# Patient Record
Sex: Female | Born: 1955
Health system: Southern US, Community
[De-identification: ages and names within clinical notes are randomized; demographics above are authoritative.]

## PROBLEM LIST (undated history)

## (undated) ENCOUNTER — Ambulatory Visit (HOSPITAL_COMMUNITY): Admission: EM | Disposition: A | Discharge status: Other Acute Inpatient Hospital | Payer: HMO

## (undated) DIAGNOSIS — G4733 Obstructive sleep apnea (adult) (pediatric): Secondary | ICD-10-CM

## (undated) DIAGNOSIS — I10 Essential (primary) hypertension: Secondary | ICD-10-CM

## (undated) DIAGNOSIS — H409 Unspecified glaucoma: Secondary | ICD-10-CM

## (undated) DIAGNOSIS — G35 Multiple sclerosis: Secondary | ICD-10-CM

## (undated) DIAGNOSIS — R32 Unspecified urinary incontinence: Secondary | ICD-10-CM

## (undated) DIAGNOSIS — G43909 Migraine, unspecified, not intractable, without status migrainosus: Secondary | ICD-10-CM

## (undated) DIAGNOSIS — G2581 Restless legs syndrome: Secondary | ICD-10-CM

## (undated) DIAGNOSIS — I82409 Acute embolism and thrombosis of unspecified deep veins of unspecified lower extremity: Secondary | ICD-10-CM

## (undated) DIAGNOSIS — K8050 Calculus of bile duct without cholangitis or cholecystitis without obstruction: Secondary | ICD-10-CM

## (undated) DIAGNOSIS — G4730 Sleep apnea, unspecified: Secondary | ICD-10-CM

## (undated) DIAGNOSIS — J189 Pneumonia, unspecified organism: Secondary | ICD-10-CM

## (undated) DIAGNOSIS — R0602 Shortness of breath: Secondary | ICD-10-CM

## (undated) DIAGNOSIS — IMO0001 Reserved for inherently not codable concepts without codable children: Secondary | ICD-10-CM

## (undated) DIAGNOSIS — H469 Unspecified optic neuritis: Secondary | ICD-10-CM

## (undated) DIAGNOSIS — K219 Gastro-esophageal reflux disease without esophagitis: Secondary | ICD-10-CM

## (undated) DIAGNOSIS — E119 Type 2 diabetes mellitus without complications: Secondary | ICD-10-CM

## (undated) DIAGNOSIS — E039 Hypothyroidism, unspecified: Secondary | ICD-10-CM

## (undated) DIAGNOSIS — K5790 Diverticulosis of intestine, part unspecified, without perforation or abscess without bleeding: Secondary | ICD-10-CM

## (undated) DIAGNOSIS — E7800 Pure hypercholesterolemia, unspecified: Secondary | ICD-10-CM

## (undated) DIAGNOSIS — F988 Other specified behavioral and emotional disorders with onset usually occurring in childhood and adolescence: Secondary | ICD-10-CM

## (undated) DIAGNOSIS — R252 Cramp and spasm: Secondary | ICD-10-CM

## (undated) DIAGNOSIS — R011 Cardiac murmur, unspecified: Secondary | ICD-10-CM

## (undated) DIAGNOSIS — R600 Localized edema: Secondary | ICD-10-CM

## (undated) DIAGNOSIS — E079 Disorder of thyroid, unspecified: Secondary | ICD-10-CM

## (undated) DIAGNOSIS — F419 Anxiety disorder, unspecified: Secondary | ICD-10-CM

## (undated) HISTORY — DX: Sleep apnea, unspecified: G47.30

## (undated) HISTORY — DX: Unspecified glaucoma: H40.9

## (undated) HISTORY — DX: Shortness of breath: R06.02

## (undated) HISTORY — PX: MENISCUS REPAIR: SHX5179

## (undated) HISTORY — PX: TUBAL LIGATION: SHX77

## (undated) HISTORY — DX: Pure hypercholesterolemia, unspecified: E78.00

## (undated) HISTORY — DX: Localized edema: R60.0

## (undated) HISTORY — DX: Anxiety disorder, unspecified: F41.9

## (undated) HISTORY — DX: Hypothyroidism, unspecified: E03.9

## (undated) HISTORY — PX: BREAST LUMPECTOMY: SHX2

## (undated) HISTORY — PX: ABDOMINAL HYSTERECTOMY: SHX81

---
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
## 2006-03-20 ENCOUNTER — Encounter: Admission: RE | Admit: 2006-03-20 | Discharge: 2006-03-20 | Payer: Self-pay | Admitting: Family Medicine

## 2006-05-23 ENCOUNTER — Ambulatory Visit (HOSPITAL_COMMUNITY): Admission: RE | Admit: 2006-05-23 | Discharge: 2006-05-23 | Payer: Self-pay | Admitting: Obstetrics and Gynecology

## 2006-05-26 ENCOUNTER — Encounter: Admission: RE | Admit: 2006-05-26 | Discharge: 2006-05-26 | Payer: Self-pay | Admitting: Obstetrics and Gynecology

## 2006-10-27 ENCOUNTER — Encounter (INDEPENDENT_AMBULATORY_CARE_PROVIDER_SITE_OTHER): Payer: Self-pay | Admitting: Diagnostic Radiology

## 2006-10-27 ENCOUNTER — Encounter: Admission: RE | Admit: 2006-10-27 | Discharge: 2006-10-27 | Payer: Self-pay | Admitting: Family Medicine

## 2008-07-24 ENCOUNTER — Encounter: Admission: RE | Admit: 2008-07-24 | Discharge: 2008-07-24 | Payer: Self-pay | Admitting: Family Medicine

## 2008-08-18 ENCOUNTER — Emergency Department (HOSPITAL_COMMUNITY): Admission: EM | Admit: 2008-08-18 | Discharge: 2008-08-18 | Payer: Self-pay | Admitting: Emergency Medicine

## 2008-10-18 ENCOUNTER — Emergency Department (HOSPITAL_COMMUNITY): Admission: EM | Admit: 2008-10-18 | Discharge: 2008-10-18 | Payer: Self-pay | Admitting: Family Medicine

## 2009-02-23 ENCOUNTER — Encounter: Admission: RE | Admit: 2009-02-23 | Discharge: 2009-02-23 | Payer: Self-pay | Admitting: Family Medicine

## 2009-07-24 ENCOUNTER — Emergency Department (HOSPITAL_COMMUNITY): Admission: EM | Admit: 2009-07-24 | Discharge: 2009-07-25 | Payer: Self-pay | Admitting: Emergency Medicine

## 2010-02-14 ENCOUNTER — Encounter: Payer: Self-pay | Admitting: Neurology

## 2010-02-14 ENCOUNTER — Encounter: Payer: Self-pay | Admitting: Podiatry

## 2010-02-14 ENCOUNTER — Encounter: Payer: Self-pay | Admitting: Family Medicine

## 2010-04-11 LAB — URINALYSIS, ROUTINE W REFLEX MICROSCOPIC
Bilirubin Urine: NEGATIVE
Glucose, UA: NEGATIVE mg/dL
Hgb urine dipstick: NEGATIVE
Ketones, ur: NEGATIVE mg/dL
pH: 6 (ref 5.0–8.0)

## 2010-04-11 LAB — POCT I-STAT, CHEM 8
Chloride: 106 mEq/L (ref 96–112)
Creatinine, Ser: 0.9 mg/dL (ref 0.4–1.2)
Glucose, Bld: 83 mg/dL (ref 70–99)
HCT: 38 % (ref 36.0–46.0)
Hemoglobin: 12.9 g/dL (ref 12.0–15.0)
Potassium: 3.8 mEq/L (ref 3.5–5.1)
Sodium: 141 mEq/L (ref 135–145)

## 2010-04-11 LAB — DIFFERENTIAL
Basophils Absolute: 0 10*3/uL (ref 0.0–0.1)
Basophils Relative: 0 % (ref 0–1)
Neutro Abs: 5 10*3/uL (ref 1.7–7.7)
Neutrophils Relative %: 58 % (ref 43–77)

## 2010-04-11 LAB — CBC
HCT: 35.4 % — ABNORMAL LOW (ref 36.0–46.0)
Hemoglobin: 12.2 g/dL (ref 12.0–15.0)
MCH: 29.5 pg (ref 26.0–34.0)
MCV: 85.4 fL (ref 78.0–100.0)
RBC: 4.15 MIL/uL (ref 3.87–5.11)

## 2010-04-11 LAB — POCT CARDIAC MARKERS: Troponin i, poc: <0.05 ng/mL (ref 0.00–0.09)

## 2010-05-02 LAB — POCT I-STAT, CHEM 8
Creatinine, Ser: 0.8 mg/dL (ref 0.4–1.2)
HCT: 37 % (ref 36.0–46.0)
Hemoglobin: 12.6 g/dL (ref 12.0–15.0)
Potassium: 4 mEq/L (ref 3.5–5.1)
Sodium: 139 mEq/L (ref 135–145)
TCO2: 25 mmol/L (ref 0–100)

## 2010-05-02 LAB — URINALYSIS, ROUTINE W REFLEX MICROSCOPIC
Nitrite: NEGATIVE
Specific Gravity, Urine: 1.013 (ref 1.005–1.030)
pH: 5.5 (ref 5.0–8.0)

## 2010-05-02 LAB — DIFFERENTIAL
Basophils Relative: 0 % (ref 0–1)
Eosinophils Relative: 2 % (ref 0–5)
Monocytes Absolute: 0.6 10*3/uL (ref 0.1–1.0)
Monocytes Relative: 9 % (ref 3–12)
Neutro Abs: 4.1 10*3/uL (ref 1.7–7.7)

## 2010-05-02 LAB — CBC
HCT: 36.4 % (ref 36.0–46.0)
Hemoglobin: 12.5 g/dL (ref 12.0–15.0)
MCHC: 34.3 g/dL (ref 30.0–36.0)
RBC: 4.25 MIL/uL (ref 3.87–5.11)

## 2011-07-17 ENCOUNTER — Encounter (HOSPITAL_COMMUNITY): Payer: Self-pay | Admitting: *Deleted

## 2011-07-17 ENCOUNTER — Emergency Department (INDEPENDENT_AMBULATORY_CARE_PROVIDER_SITE_OTHER): Payer: Medicare Other

## 2011-07-17 ENCOUNTER — Emergency Department (HOSPITAL_COMMUNITY)
Admission: EM | Admit: 2011-07-17 | Discharge: 2011-07-17 | Disposition: A | Discharge status: Home / Self Care | Payer: Medicare Other | Source: Home / Self Care | Attending: Emergency Medicine | Admitting: Emergency Medicine

## 2011-07-17 DIAGNOSIS — M7541 Impingement syndrome of right shoulder: Secondary | ICD-10-CM

## 2011-07-17 HISTORY — DX: Gastro-esophageal reflux disease without esophagitis: K21.9

## 2011-07-17 HISTORY — DX: Restless legs syndrome: G25.81

## 2011-07-17 HISTORY — DX: Multiple sclerosis: G35

## 2011-07-17 HISTORY — DX: Disorder of thyroid, unspecified: E07.9

## 2011-07-17 MED ORDER — HYDROCODONE-ACETAMINOPHEN 5-325 MG PO TABS: 2.0000 | ORAL_TABLET | ORAL | Status: AC | PRN

## 2011-07-17 MED ORDER — MELOXICAM 15 MG PO TABS: 15.0000 mg | ORAL_TABLET | Freq: Every day | ORAL | Status: AC

## 2011-07-17 MED ORDER — PREDNISONE 20 MG PO TABS: ORAL_TABLET | ORAL | Status: AC

## 2011-07-17 NOTE — ED Notes (Signed)
Denies any injury.  C/O gradual onset right shoulder pain x few weeks with progressive worsening.  Has tried heat, ice, TENS unit without any sustained relief.  Some ROM is painful, but "it hurts even if I'm perfectly still; sometimes hurts even just to breathe".  Pain intensified today "to where I couldn't even pay attention in church"; RUE numbness started today and she "had difficulty turning the steering wheel the pain was so bad".

## 2011-07-17 NOTE — ED Provider Notes (Signed)
History     CSN: [id number]  Arrival date & time 07/17/11  1348   First MD Initiated Contact with Patient 07/17/11 1358      Chief Complaint  Patient presents with  . Shoulder Pain    (Consider location/radiation/quality/duration/timing/severity/associated sxs/prior treatment) HPI Comments: Patient reports gradual onset of achy, "deep" right shoulder pain starting over the past several weeks. States this started after she spent some time cleaning out a relative's house, she was doing a lot of repetitive arm movements. States it has gotten acutely worse over the past week. States the pain is keeping her from going to sleep, is not waking her up at night. States the pain is worse when having her arms lifted up while driving,or forward flexed. Reports some decreased sensation down her right arm starting today. Reports weakness in her grip while she was trying to put together a playground playset, which involved a lot of of forward flexion and wrist rotation, and while driving when her arm was lifted up. This has since resolved. No arm weakness. No other neurologic deficits noted. She has tried heat, ice, TENS unit without relief. She does have a history of multiple sclerosis, but states that she usually gets the pains in her legs when she starts to have a flare. She does wear narrow strep bras, and states of her bras are the same. No history of recent or remote injury to her neck, shoulder. No fever, nausea, vomiting, redness, bruising, rash.  ROS as noted in HPI. All other ROS negative.    Patient is a 56 y.o. female presenting with shoulder pain. The history is provided by the patient. No language interpreter was used.  Shoulder Pain This is a new problem. The current episode started more than 1 week ago. The problem occurs constantly. The problem has been gradually worsening. Pertinent negatives include no chest pain and no headaches. Exacerbated by: use. Nothing relieves the symptoms. She has  tried a warm compress and a cold compress for the symptoms. The treatment provided no relief.    Past Medical History  Diagnosis Date  . Multiple sclerosis   . Restless leg syndrome   . Thyroid disease     Hypothyroidism  . GERD (gastroesophageal reflux disease)     Past Surgical History  Procedure Date  . Cesarean section   . Abdominal hysterectomy     History reviewed. No pertinent family history.  History  Substance Use Topics  . Smoking status: Former Games developer  . Smokeless tobacco: Not on file  . Alcohol Use: No    OB History    Grav Para Term Preterm Abortions TAB SAB Ect Mult Living                  Review of Systems  Constitutional: Negative for fever.  Cardiovascular: Negative for chest pain.  Musculoskeletal: Positive for arthralgias. Negative for back pain.  Skin: Negative.   Neurological: Positive for weakness and numbness. Negative for headaches.    Allergies  Review of patient's allergies indicates no known allergies.  Home Medications   Current Outpatient Rx  Name Route Sig Dispense Refill  . AMANTADINE HCL 100 MG PO CAPS Oral Take 100 mg by mouth 2 (two) times daily.    Marland Kitchen FLUOXETINE HCL PO Oral Take 30 mg by mouth daily.    Marland Kitchen GABAPENTIN 300 MG PO CAPS Oral Take 300 mg by mouth at bedtime.    . INTERFERON BETA-1A 30 MCG/0.5ML IM KIT Intramuscular Inject 30 mcg  into the muscle every 7 (seven) days.    Marland Kitchen LEVOTHYROXINE SODIUM 150 MCG PO TABS Oral Take 150 mcg by mouth daily.    Marland Kitchen OMEPRAZOLE 20 MG PO CPDR Oral Take 20 mg by mouth daily.    . OXYBUTYNIN CHLORIDE 5 MG PO TABS Oral Take 5 mg by mouth daily.    Marland Kitchen ROPINIROLE HCL 3 MG PO TABS Oral Take 3 mg by mouth at bedtime.    Marland Kitchen HYDROCODONE-ACETAMINOPHEN 5-325 MG PO TABS Oral Take 2 tablets by mouth every 4 (four) hours as needed for pain. 20 tablet 0  . MELOXICAM 15 MG PO TABS Oral Take 1 tablet (15 mg total) by mouth daily. 14 tablet 0  . PREDNISONE 20 MG PO TABS  Take 3 tabs po on first day, 2 tabs  second day, 2 tabs third day, 1 tab fourth day, 1 tab 5th day. Take with food. 9 tablet 0    BP 114/65  Pulse 70  Temp 98 F (36.7 C) (Oral)  Resp 16  SpO2 97%  Physical Exam  Nursing note and vitals reviewed. Constitutional: She is oriented to person, place, and time. She appears well-developed and well-nourished. No distress.  HENT:  Head: Normocephalic and atraumatic.  Eyes: Conjunctivae and EOM are normal.  Neck: Normal range of motion.  Cardiovascular: Normal rate, regular rhythm, normal heart sounds and intact distal pulses.   Pulmonary/Chest: Effort normal.  Abdominal: She exhibits no distension.  Musculoskeletal: Normal range of motion.       Right shoulder: She exhibits tenderness. She exhibits no swelling, no effusion and no crepitus.       Deep grooves where bras straps are in shoulders bilaterally, right more than left. Tenderness in this area, and at [facility] joint .R shoulder with ROM normal  Drop test norma, Tenderness entire joint, clavicle NT , scapula NT , proximal humerus NT, shoulder joint NT, Motor strength normal , Sensation intact LT, sharp/soft/temperature intact over entire arm, over deltoid region, distal NVI with hand on affected side having intact sensation and strength in the distribution of the median, radial, and ulnar nerve   negative tenderness in bicipital groove, positive empty can test, negative liftoff test, no instability with abduction/external rotation.   Neurological: She is alert and oriented to person, place, and time.  Reflex Scores:      Brachioradialis reflexes are 2+ on the right side and 2+ on the left side. Skin: Skin is warm and dry.  Psychiatric: She has a normal mood and affect. Her behavior is normal. Judgment and thought content normal.    ED Course  Procedures (including critical care time)  Labs Reviewed - No data to display Dg Shoulder Right  07/17/2011  *RADIOLOGY REPORT*  Clinical Data: The anterior shoulder pain.  RIGHT  SHOULDER - 2+ VIEW  Comparison: None  Findings: The glenohumeral joint is maintained.  There are mild AC joint degenerative changes.  No acute fracture.  The right lung apex is clear.  IMPRESSION: No acute bony findings.  Mild AC joint degenerative changes.  Original Report Authenticated By: P. Loralie Champagne, M.D.     1. Impingement syndrome of right shoulder      Dg Shoulder Right  07/17/2011  *RADIOLOGY REPORT*  Clinical Data: The anterior shoulder pain.  RIGHT SHOULDER - 2+ VIEW  Comparison: None  Findings: The glenohumeral joint is maintained.  There are mild AC joint degenerative changes.  No acute fracture.  The right lung apex is clear.  IMPRESSION: No  acute bony findings.  Mild AC joint degenerative changes.  Original Report Authenticated By: P. Loralie Champagne, M.D.     MDM  H&P consistent with shoulder impingement. Pain is aggravated with empty can test, and when her arm is moved through forward flexion/internal/external rotation. Grip is equal bilaterally, and right grip is normal when moving arm through complete range of motion. Suspect that she has some impingement which would also contribute to some of her neurologic symptoms. Will check x-ray. Also, discussed with her that the bra straps are most likely contributing to her shoulder pain as well. If her  x-rays are normal, will refer her to Dr. Magnus Ivan, orthopedist on call.   Imaging reviewed by myself. Report per radiologist.   Luiz Blare, MD 07/17/11 [phone]

## 2011-07-17 NOTE — Discharge Instructions (Signed)
Take the medication as written. Take 1 gram of tylenol up to 4 times a day as needed for pain.This with the meloxicam is an effective combination for pain. Take the hydrocodone/norco only for severe pain. Do not take the tylenol and hydrocodone/norco as they both have tylenol in them and too much can hurt your liver. Do not exceed 4 grams of tylenol a day from all sources. Return if you get worse, have a  fever >100.4, or for any concerns.   Go to www.goodrx.com to look up your medications. This will give you a list of where you can find your prescriptions at the most affordable prices.   

## 2011-11-20 ENCOUNTER — Encounter (HOSPITAL_COMMUNITY): Payer: Self-pay

## 2011-11-20 ENCOUNTER — Emergency Department (INDEPENDENT_AMBULATORY_CARE_PROVIDER_SITE_OTHER): Payer: Medicare Other

## 2011-11-20 ENCOUNTER — Emergency Department (INDEPENDENT_AMBULATORY_CARE_PROVIDER_SITE_OTHER)
Admission: EM | Admit: 2011-11-20 | Discharge: 2011-11-20 | Disposition: A | Discharge status: Home / Self Care | Payer: Medicare Other | Source: Home / Self Care | Attending: Emergency Medicine | Admitting: Emergency Medicine

## 2011-11-20 DIAGNOSIS — Z79899 Other long term (current) drug therapy: Secondary | ICD-10-CM

## 2011-11-20 DIAGNOSIS — W19XXXA Unspecified fall, initial encounter: Secondary | ICD-10-CM

## 2011-11-20 DIAGNOSIS — S61409A Unspecified open wound of unspecified hand, initial encounter: Secondary | ICD-10-CM

## 2011-11-20 DIAGNOSIS — S61419A Laceration without foreign body of unspecified hand, initial encounter: Secondary | ICD-10-CM

## 2011-11-20 HISTORY — DX: Type 2 diabetes mellitus without complications: E11.9

## 2011-11-20 NOTE — ED Notes (Signed)
Patient states fell and hurt left hand today, small laceration, happened approx 11:15

## 2011-11-20 NOTE — ED Provider Notes (Signed)
History     CSN: [id number]  Arrival date & time 11/20/11  1336   First MD Initiated Contact with Patient 11/20/11 1403      Chief Complaint  Patient presents with  . Hand Injury    (Consider location/radiation/quality/duration/timing/severity/associated sxs/prior treatment) HPI Comments: Patient presents for yourself to urgent care describing that she fell today backwards when she was at a childcare at church when she fell backwards hurting and cutting the palmar surface of her left hand and the base of her left the fifth digit. This will occur earlier this morning around 11 AM. She is unclear about what happened but denies any loss of consciousness and she remembers the event but is not sure if she tripped slipped or what happened. She does admit that she was somewhat drowsy or not in her usual state. She also recalls that people around her helped her incorporate and provide her with some candy as someone suspected that she might have had a low sugar. No emergent medical attention or vital signs were obtained at that point. She presents almost 2 hours later to urgent care.    Full of unclear mechanism. As patient denies any loss of consciousness. She reports that other individuals reported that she looks somewhat drowsy when she fell she was able to [facility] her left hand to prevent a fall and did not hit her head as she fell backwards. Daughter is oh also reported phoned she felt somewhat she was in an environment in which the temperature was very high and she might have not had anything to eat for several hours they suspected that she could have had develop a hypoglycemic event patient was given candy after the event. Patient denies pre-event having felt any of the following symptoms. Feeling presyncopal, headaches, nauseous, chest pains, shortness of breath, or palpitations.     Patient is a 56 y.o. female presenting with hand injury. The history is provided by the patient.  Hand  Injury  The incident occurred 1 to 2 hours ago. Incident location: At church (child-care) The injury mechanism was a fall. Pain location: Left hand. The quality of the pain is described as aching. The pain is at a severity of 4/10. The pain is mild. The pain has been constant since the incident. Pertinent negatives include no fever and no malaise/fatigue. She has tried nothing for the symptoms. The treatment provided no relief.    Past Medical History  Diagnosis Date  . Multiple sclerosis   . Restless leg syndrome   . Thyroid disease     Hypothyroidism  . GERD (gastroesophageal reflux disease)   . Diabetes mellitus without complication     Past Surgical History  Procedure Date  . Cesarean section   . Abdominal hysterectomy     No family history on file.  History  Substance Use Topics  . Smoking status: Former Games developer  . Smokeless tobacco: Not on file  . Alcohol Use: No    OB History    Grav Para Term Preterm Abortions TAB SAB Ect Mult Living                  Review of Systems  Constitutional: Positive for activity change and appetite change. Negative for fever, chills, malaise/fatigue and diaphoresis.  Respiratory: Negative for cough, chest tightness and shortness of breath.   Gastrointestinal: Negative for nausea and abdominal pain.  Skin: Negative for color change.  Neurological: Negative for weakness, numbness and headaches.    Allergies  Review  of patient's allergies indicates no known allergies.  Home Medications   Current Outpatient Rx  Name Route Sig Dispense Refill  . AMANTADINE HCL 100 MG PO CAPS Oral Take 100 mg by mouth 2 (two) times daily.    Marland Kitchen FLUOXETINE HCL PO Oral Take 30 mg by mouth daily.    Marland Kitchen GABAPENTIN 300 MG PO CAPS Oral Take 300 mg by mouth at bedtime.    . INTERFERON BETA-1A 30 MCG/0.5ML IM KIT Intramuscular Inject 30 mcg into the muscle every 7 (seven) days.    Marland Kitchen LEVOTHYROXINE SODIUM 150 MCG PO TABS Oral Take 150 mcg by mouth daily.    Marland Kitchen  OMEPRAZOLE 20 MG PO CPDR Oral Take 20 mg by mouth daily.    . OXYBUTYNIN CHLORIDE 5 MG PO TABS Oral Take 5 mg by mouth daily.    Marland Kitchen ROPINIROLE HCL 3 MG PO TABS Oral Take 3 mg by mouth at bedtime.    . MELOXICAM 15 MG PO TABS Oral Take 1 tablet (15 mg total) by mouth daily. 14 tablet 0    BP 124/73  Pulse 76  Temp 97.9 F (36.6 C) (Oral)  Resp 18  SpO2 97%  Physical Exam  Nursing note and vitals reviewed. Constitutional: Vital signs are normal. She appears well-developed and well-nourished.  Non-toxic appearance. She does not have a sickly appearance. She does not appear ill. No distress.  HENT:  Mouth/Throat: No oropharyngeal exudate.  Eyes: Conjunctivae normal are normal.  Neck: Neck supple. No tracheal deviation present.  Cardiovascular: Normal rate and regular rhythm.  Exam reveals no gallop and no friction rub.   No murmur heard. Pulmonary/Chest: Effort normal and breath sounds normal.  Abdominal: She exhibits no distension. There is no tenderness.  Genitourinary: No vaginal discharge found.  Musculoskeletal: She exhibits no tenderness.       Left hand: She exhibits laceration.       Hands: Neurological: She is alert.  Skin: Rash noted. No erythema.    ED Course  Procedures (including critical care time)  Labs Reviewed  GLUCOSE, CAPILLARY - Abnormal; Notable for the following:    Glucose-Capillary 108 (*)     All other components within normal limits   Dg Hand Complete Left  11/20/2011  *RADIOLOGY REPORT*  Clinical Data: Trauma, pain to the second digit.  LEFT HAND - COMPLETE 3+ VIEW  Comparison: None.  Findings: No evidence of fracture or dislocation of the carpal bones, metacarpals, or phalanges.  Particular attention is directed to the second digit.  No radiodense foreign body.  IMPRESSION: No evidence of hand fracture.   Original Report Authenticated By: Genevive Bi, M.D.      1. Fall   2. Laceration of hand       MDM  Full, after suspected hypoglycemic  event. Without loss of consciousness. Sustaining a small irregular left palmar hand laceration that was repaired with Steri-Strips after cleaning seeing. Patient had a full neurological and cardiovascular exam including a rhythm strip of 60 seconds and reveal no abnormalities.     Jimmie Molly, MD 11/20/11 [phone]

## 2013-05-12 ENCOUNTER — Encounter (HOSPITAL_COMMUNITY): Payer: Self-pay | Admitting: Emergency Medicine

## 2013-05-12 ENCOUNTER — Emergency Department (HOSPITAL_COMMUNITY): Payer: Medicare Other

## 2013-05-12 ENCOUNTER — Emergency Department (HOSPITAL_COMMUNITY)
Admission: EM | Admit: 2013-05-12 | Discharge: 2013-05-12 | Disposition: A | Discharge status: Home / Self Care | Payer: Medicare Other | Attending: Emergency Medicine | Admitting: Emergency Medicine

## 2013-05-12 DIAGNOSIS — R0602 Shortness of breath: Secondary | ICD-10-CM | POA: Insufficient documentation

## 2013-05-12 DIAGNOSIS — R0789 Other chest pain: Secondary | ICD-10-CM | POA: Insufficient documentation

## 2013-05-12 DIAGNOSIS — Z79899 Other long term (current) drug therapy: Secondary | ICD-10-CM | POA: Insufficient documentation

## 2013-05-12 DIAGNOSIS — M549 Dorsalgia, unspecified: Secondary | ICD-10-CM | POA: Insufficient documentation

## 2013-05-12 DIAGNOSIS — Z87891 Personal history of nicotine dependence: Secondary | ICD-10-CM | POA: Insufficient documentation

## 2013-05-12 DIAGNOSIS — R079 Chest pain, unspecified: Secondary | ICD-10-CM

## 2013-05-12 DIAGNOSIS — E119 Type 2 diabetes mellitus without complications: Secondary | ICD-10-CM | POA: Insufficient documentation

## 2013-05-12 DIAGNOSIS — K219 Gastro-esophageal reflux disease without esophagitis: Secondary | ICD-10-CM | POA: Insufficient documentation

## 2013-05-12 DIAGNOSIS — E039 Hypothyroidism, unspecified: Secondary | ICD-10-CM | POA: Insufficient documentation

## 2013-05-12 DIAGNOSIS — G2581 Restless legs syndrome: Secondary | ICD-10-CM | POA: Insufficient documentation

## 2013-05-12 LAB — CBC WITH DIFFERENTIAL/PLATELET
BASOS PCT: 0 % (ref 0–1)
Basophils Absolute: 0 10*3/uL (ref 0.0–0.1)
EOS PCT: 2 % (ref 0–5)
Eosinophils Absolute: 0.2 10*3/uL (ref 0.0–0.7)
HEMATOCRIT: 37.7 % (ref 36.0–46.0)
HEMOGLOBIN: 12.3 g/dL (ref 12.0–15.0)
Lymphocytes Relative: 33 % (ref 12–46)
Lymphs Abs: 2.2 10*3/uL (ref 0.7–4.0)
MCH: 28.1 pg (ref 26.0–34.0)
MCHC: 32.6 g/dL (ref 30.0–36.0)
MCV: 86.1 fL (ref 78.0–100.0)
MONO ABS: 0.6 10*3/uL (ref 0.1–1.0)
MONOS PCT: 9 % (ref 3–12)
NEUTROS ABS: 3.7 10*3/uL (ref 1.7–7.7)
Neutrophils Relative %: 56 % (ref 43–77)
Platelets: 251 10*3/uL (ref 150–400)
RBC: 4.38 MIL/uL (ref 3.87–5.11)
RDW: 14.3 % (ref 11.5–15.5)
WBC: 6.6 10*3/uL (ref 4.0–10.5)

## 2013-05-12 LAB — I-STAT TROPONIN, ED: TROPONIN I, POC: 0 ng/mL (ref 0.00–0.08)

## 2013-05-12 LAB — COMPREHENSIVE METABOLIC PANEL
ALBUMIN: 3.8 g/dL (ref 3.5–5.2)
ALT: 20 U/L (ref 0–35)
AST: 18 U/L (ref 0–37)
Alkaline Phosphatase: 95 U/L (ref 39–117)
BILIRUBIN TOTAL: 0.4 mg/dL (ref 0.3–1.2)
BUN: 13 mg/dL (ref 6–23)
CALCIUM: 9.2 mg/dL (ref 8.4–10.5)
CHLORIDE: 106 meq/L (ref 96–112)
CO2: 25 meq/L (ref 19–32)
CREATININE: 0.77 mg/dL (ref 0.50–1.10)
GFR calc Af Amer: >90 mL/min (ref >90)
Glucose, Bld: 123 mg/dL — ABNORMAL HIGH (ref 70–99)
Potassium: 4.1 mEq/L (ref 3.7–5.3)
Sodium: 143 mEq/L (ref 137–147)
Total Protein: 7.1 g/dL (ref 6.0–8.3)

## 2013-05-12 LAB — LIPASE, BLOOD: Lipase: 22 U/L (ref 11–59)

## 2013-05-12 LAB — D-DIMER, QUANTITATIVE (NOT AT ARMC): D DIMER QUANT: 0.45 ug{FEU}/mL (ref 0.00–0.48)

## 2013-05-12 NOTE — ED Provider Notes (Signed)
Medical screening examination/treatment/procedure(s) were performed by non-physician practitioner and as supervising physician I was immediately available for consultation/collaboration.   EKG Interpretation None       Aceyn Kathol R. Ananda Caya, MD 05/12/13 2042 

## 2013-05-12 NOTE — ED Provider Notes (Signed)
CSN: [id number]     Arrival date & time 05/12/13  1538 History   First MD Initiated Contact with Patient 05/12/13 1554     Chief Complaint  Patient presents with  . Chest Pain     (Consider location/radiation/quality/duration/timing/severity/associated sxs/prior Treatment) HPI Comments: Patient presents with history of diabetes, hyperlipidemia, remote smoking history -- complaint of chest heaviness in the middle of her chest and right side of her chest with radiation to her mid back. Patient had an episode of heaviness 5 days ago while working in her yard that resolved after sitting, resting, and cooling off. She did not have any recurrence until this morning when she developed a pressure approximately 7 AM today. It has been constant since that time. Patient states that she feels short of breath especially with exertion. She denies nausea, palpitations, diaphoresis. Patient denies risk factors for pulmonary embolism including: unilateral leg swelling, history of DVT/PE/other blood clots, use of estrogens, recent immobilizations, recent surgery, recent travel (>4hr segment), malignancy, hemoptysis. No family history of CAD/MI. No cough or fever. The onset of this condition was acute. The course is constant. Aggravating factors: none. Alleviating factors: none.      The history is provided by the patient.    Past Medical History  Diagnosis Date  . Multiple sclerosis   . Restless leg syndrome   . Thyroid disease     Hypothyroidism  . GERD (gastroesophageal reflux disease)   . Diabetes mellitus without complication    Past Surgical History  Procedure Laterality Date  . Cesarean section    . Abdominal hysterectomy     History reviewed. No pertinent family history. History  Substance Use Topics  . Smoking status: Former Research scientist (life sciences)  . Smokeless tobacco: Not on file  . Alcohol Use: No   OB History   Grav Para Term Preterm Abortions TAB SAB Ect Mult Living                 Review of  Systems  Constitutional: Negative for fever and diaphoresis.  Eyes: Negative for redness.  Respiratory: Positive for shortness of breath. Negative for cough.   Cardiovascular: Positive for chest pain. Negative for palpitations and leg swelling.  Gastrointestinal: Negative for nausea, vomiting and abdominal pain.  Genitourinary: Negative for dysuria.  Musculoskeletal: Positive for back pain. Negative for neck pain.  Skin: Negative for rash.  Neurological: Negative for syncope and light-headedness.    Allergies  Review of patient's allergies indicates no known allergies.  Home Medications   Prior to Admission medications   Medication Sig Start Date End Date Taking? Authorizing Provider  amantadine (SYMMETREL) 100 MG capsule Take 100 mg by mouth 2 (two) times daily.    Historical Provider, MD  FLUOXETINE HCL PO Take 30 mg by mouth daily.    Historical Provider, MD  gabapentin (NEURONTIN) 300 MG capsule Take 300 mg by mouth at bedtime.    Historical Provider, MD  interferon beta-1a (AVONEX) 30 MCG/0.5ML injection Inject 30 mcg into the muscle every 7 (seven) days.    Historical Provider, MD  levothyroxine (SYNTHROID, LEVOTHROID) 150 MCG tablet Take 150 mcg by mouth daily.    Historical Provider, MD  omeprazole (PRILOSEC) 20 MG capsule Take 20 mg by mouth daily.    Historical Provider, MD  oxybutynin (DITROPAN) 5 MG tablet Take 5 mg by mouth daily.    Historical Provider, MD  rOPINIRole (REQUIP) 3 MG tablet Take 3 mg by mouth at bedtime.    Historical Provider, MD  BP 162/67  Pulse 79  Temp(Src) 98.3 F (36.8 C) (Oral)  Resp 14  Ht 5\' 5"  (1.651 m)  Wt 290 lb (131.543 kg)  BMI 48.26 kg/m2  SpO2 97%   Physical Exam  Nursing note and vitals reviewed. Constitutional: She appears well-developed and well-nourished.  HENT:  Head: Normocephalic and atraumatic.  Mouth/Throat: Mucous membranes are normal. Mucous membranes are not dry.  Eyes: Conjunctivae are normal.  Neck: Trachea  normal and normal range of motion. Neck supple. Normal carotid pulses and no JVD present. No muscular tenderness present. Carotid bruit is not present. No tracheal deviation present.  Cardiovascular: Normal rate, regular rhythm, S1 normal, S2 normal, normal heart sounds and intact distal pulses.  Exam reveals no decreased pulses.   No murmur heard. Pulmonary/Chest: Effort normal. No respiratory distress. She has no wheezes. She exhibits no tenderness.  Abdominal: Soft. Normal aorta and bowel sounds are normal. There is no tenderness. There is no rebound and no guarding.  Musculoskeletal: Normal range of motion. She exhibits no edema and no tenderness.  No lower extremity edema.  Neurological: She is alert.  Skin: Skin is warm and dry. She is not diaphoretic. No cyanosis. No pallor.  Psychiatric: She has a normal mood and affect.    ED Course  Procedures (including critical care time) Labs Review Labs Reviewed  COMPREHENSIVE METABOLIC PANEL - Abnormal; Notable for the following:    Glucose, Bld 123 (*)    All other components within normal limits  CBC WITH DIFFERENTIAL  D-DIMER, QUANTITATIVE  LIPASE, BLOOD  I-STAT TROPOININ, ED    Imaging Review Dg Chest 2 View  05/12/2013   CLINICAL DATA:  Substernal chest pain with tightness and pressure. Cough and congestion.  EXAM: CHEST  2 VIEW  COMPARISON:  None.  FINDINGS: Cardiothoracic index 57 percent on PA view. The lungs appear clear. No pleural effusion. Mild to moderate thoracic spondylosis. No airway thickening identified.  IMPRESSION: 1. Mildly enlarged cardiopericardial silhouette. 2. Thoracic spondylosis.   Electronically Signed   By: Sherryl Barters M.D.   On: 05/12/2013 17:04     EKG Interpretation None      4:34 PM Patient seen and examined. Work-up initiated. Medications ordered.   Vital signs reviewed and are as follows: Filed Vitals:   05/12/13 1610  BP: 162/67  Pulse: 79  Temp:   Resp: 14    Date: 05/12/2013   Rate: 74  Rhythm: normal sinus rhythm  QRS Axis: normal  Intervals: normal  ST/T Wave abnormalities: normal  Conduction Disutrbances:none  Narrative Interpretation:   Old EKG Reviewed: none available  7:15 PM Patient d/w Dr. Alvino Chapel. Feel okay for d/c to home. HEART=2. Patient has PCP f/u. She is feeling better, pain resolved.   Repeat EKG shows no sign of evolving MI.    Date: 05/12/2013  Rate: 74  Rhythm: normal sinus rhythm  QRS Axis: normal  Intervals: normal  ST/T Wave abnormalities: normal  Conduction Disutrbances:none  Narrative Interpretation:   Old EKG Reviewed: unchanged  Pt informed of mild cardiomegaly.   Patient was counseled to return with severe chest pain, especially if the pain is crushing or pressure-like and spreads to the arms, back, neck, or jaw, or if they have sweating, nausea, or shortness of breath with the pain. They were encouraged to call 911 with these symptoms.   They were also told to return if their chest pain gets worse and does not go away with rest, they have an attack of chest pain  lasting longer than usual despite rest and treatment with the medications their caregiver has prescribed, if they wake from sleep with chest pain or shortness of breath, if they feel dizzy or faint, if they have chest pain not typical of their usual pain, or if they have any other emergent concerns regarding their health.  The patient verbalized understanding and agreed.    MDM   Final diagnoses:  Chest pain   Patient with chest tightness. Feel patient is low risk for ACS given history (poor story for ACS/MI, atypical and right sided CP), negative troponin(s), normal/unchanged EKG. HEART=2. Other causes of CP considered. D-dimer normal, do not suspect PE. Dissection low suspicion with this history, equal radial pulses, neg d-dimer. No PNA on CXR. Borderline cardiomegaly. No EKG findings, JVD to suggest tamponade or pericardial effusion/pericarditis. Gallbladder  etiology is possible, no transaminitis, pain controlled -- do not feel [place] necessary tonight. Patient has reliable PCP followup. Return instructions discussed with patient and daughter. Feel that she is reliable to return if worsening and followup with PCP.  No dangerous or life-threatening conditions suspected or identified by history, physical exam, and by work-up. No indications for hospitalization identified.        Carlisle Cater, PA-C 05/12/13 1922

## 2013-05-12 NOTE — ED Notes (Signed)
Pt states that she is having chest heaviness. Pt states that her pain radiates between her shoulder blades and she is also having dizziness. Pt states that she has a hx of DM and she was diagnosed 16 years ago with MS but her currently PCP does not believes that she has MS due to her MRI not being as bad as 16years worth of MS "should be." pt also complaining of right eye pain.

## 2013-05-12 NOTE — Discharge Instructions (Signed)
Please read and follow all provided instructions.  Your diagnoses today include:  1. Chest pain     Tests performed today include:  An EKG of your heart  Chest x-ray - mildly large heart on chest x-ray  Cardiac enzymes - a blood test for heart muscle damage, no sign of heart attack  Blood counts and electrolytes  D-dimer - test for blood clot that does not suggest blood clot  Vital signs. See below for your results today.   Medications prescribed:   None  Take any prescribed medications only as directed.  Follow-up instructions: Please follow-up with your primary care provider as soon as you can for further evaluation of your symptoms. If you do not have a primary care doctor -- see below for referral information.   Return instructions:  SEEK IMMEDIATE MEDICAL ATTENTION IF:  You have severe chest pain, especially if the pain is crushing or pressure-like and spreads to the arms, back, neck, or jaw, or if you have sweating, nausea (feeling sick to your stomach), or shortness of breath. THIS IS AN EMERGENCY. Don't wait to see if the pain will go away. Get medical help at once. Call 911 or [phone] (operator). DO NOT drive yourself to the hospital.   Your chest pain gets worse and does not go away with rest.   You have an attack of chest pain lasting longer than usual, despite rest and treatment with the medications your caregiver has prescribed.   You wake from sleep with chest pain or shortness of breath.  You feel dizzy or faint.  You have chest pain not typical of your usual pain for which you originally saw your caregiver.   You have any other emergent concerns regarding your health.  Additional Information: Chest pain comes from many different causes. Your caregiver has diagnosed you as having chest pain that is not specific for one problem, but does not require admission.  You are at low risk for an acute heart condition or other serious illness.   Your vital signs today  were: BP 132/69   Pulse 73   Temp(Src) 98.3 F (36.8 C) (Oral)   Resp 12   Ht 5\' 5"  (1.651 m)   Wt 290 lb (131.543 kg)   BMI 48.26 kg/m2   SpO2 98% If your blood pressure (BP) was elevated above 135/85 this visit, please have this repeated by your doctor within one month. --------------

## 2014-02-05 ENCOUNTER — Other Ambulatory Visit: Payer: Self-pay | Admitting: Physician Assistant

## 2014-02-05 DIAGNOSIS — R1013 Epigastric pain: Secondary | ICD-10-CM

## 2014-02-07 ENCOUNTER — Ambulatory Visit
Admission: RE | Admit: 2014-02-07 | Discharge: 2014-02-07 | Disposition: A | Discharge status: Home / Self Care | Payer: PPO | Source: Ambulatory Visit | Attending: Physician Assistant | Admitting: Physician Assistant

## 2014-02-07 DIAGNOSIS — R1013 Epigastric pain: Secondary | ICD-10-CM

## 2014-02-17 ENCOUNTER — Telehealth: Payer: Self-pay

## 2014-02-17 NOTE — Telephone Encounter (Signed)
Spoke with Benjamine Mola and advised that until she is seen once in this office, RAS is not able to refill meds.  Advised that until she is seen once at [facility], she is still a pt of [facility], and may request refill from that office.  She verbalized understanding of same/fim

## 2014-02-17 NOTE — Telephone Encounter (Signed)
Patient calling states she has dropped off her release at the front desk and she needs a written RX for Adderall 20 mg   Once daily.

## 2014-02-24 ENCOUNTER — Ambulatory Visit (INDEPENDENT_AMBULATORY_CARE_PROVIDER_SITE_OTHER): Payer: PPO | Admitting: Neurology

## 2014-02-24 ENCOUNTER — Encounter: Payer: Self-pay | Admitting: Neurology

## 2014-02-24 VITALS — BP 126/80 | HR 68 | Resp 14 | Ht 66.0 in | Wt 271.8 lb

## 2014-02-24 DIAGNOSIS — F09 Unspecified mental disorder due to known physiological condition: Secondary | ICD-10-CM

## 2014-02-24 DIAGNOSIS — F0789 Other personality and behavioral disorders due to known physiological condition: Secondary | ICD-10-CM | POA: Diagnosis not present

## 2014-02-24 DIAGNOSIS — G35 Multiple sclerosis: Secondary | ICD-10-CM

## 2014-02-24 DIAGNOSIS — E039 Hypothyroidism, unspecified: Secondary | ICD-10-CM | POA: Diagnosis not present

## 2014-02-24 DIAGNOSIS — R5382 Chronic fatigue, unspecified: Secondary | ICD-10-CM

## 2014-02-24 DIAGNOSIS — K219 Gastro-esophageal reflux disease without esophagitis: Secondary | ICD-10-CM | POA: Diagnosis not present

## 2014-02-24 DIAGNOSIS — R200 Anesthesia of skin: Secondary | ICD-10-CM

## 2014-02-24 DIAGNOSIS — H469 Unspecified optic neuritis: Secondary | ICD-10-CM | POA: Diagnosis not present

## 2014-02-24 MED ORDER — AMPHETAMINE-DEXTROAMPHET ER 30 MG PO CP24: 30.0000 mg | ORAL_CAPSULE | Freq: Every day | ORAL | Status: DC

## 2014-02-24 NOTE — Progress Notes (Addendum)
[facility]  PATIENT: Anna Walker DOB: [birth year]  REFERRING CLINICIAN:Elaine Laurann Montana  HISTORY FROM: Patient REASON FOR VISIT: H/O optic neuritis and possible MS   HISTORICAL  CHIEF COMPLAINT:  Chief Complaint  Patient presents with  . Multiple Sclerosis    Sts. fatigue is worse.  Has been out of Adderall for one week.  Also thinks balance is off, and grip in both hands is weaker.     HISTORY OF PRESENT ILLNESS:  Anna Walker is a 59 year old woman who presented with right optic neuritis in 1998. She had an MRI that was reportedly concerning for multiple sclerosis neurologist in [place]. Shortly thereafter, a few days after having a lumpectomy for a breast mass, she woke up with severe right sided numbness, clumsiness and weakness. She received a course of IV steroids. She had a lumbar puncture but we do not have the results. She was started on Betaseron but switched to Copaxone due to tolerability issues. She continued to have some tolerability issues and was switched to Avonex.  She reports having an episode of optic neuritis in August 2013. I saw her on 04/27/2012 and reviewed some earlier notes for her. A visual evoked potential in late 2013 showed a normal left and absent right response. An ophthalmological exam was consistent with a remote right optic neuritis. MRIs reportedly have not shown any new lesions. I reviewed imaging studies after that visit and felt that the MRI was essentially normal and there were no changes over 15 years. I recommended that she stopped the Avonex.  In July 2014, a couple months after stopping Avonex, she reported more visual disturbance on the right and she received 2 days of IV Solu-Medrol. A repeat MRI on 12/12/2012 that I personally reviewed was essentially normal.     She reports some symptoms that had been attributed to multiple sclerosis in the past including fatigue and short-term memory and concentration/focus.  Her  fatigue is her main problem.   She ran out of Adderall and fatigue was much worse.    In the past, she had been on amantadine and feels it had helped less than the stimulant.   She sleeps well, now, but was worse in past.   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  Also drives, babysits grandkids.    She feels better, in general, since stopping Avonex.    She has occasional weeks where she feels more tired.     She still notes some mental fog and decreased attention and STM with word finding issues.   Adderall has helped some but not completely.  Vision is altered on the right and she has right color desaturation and altered depth perception.     She  Feels her right leg is a little weak and clumsy and gait stumbles at times.   Rare falls.     She also has obstructive sleep apnea and has been on CPAP since 2000. Her restless leg syndrome/periodic limb movements of sleep does better on gabapentin (300 mg in evening and 600 mg with Requip at bedtime)      REVIEW OF SYSTEMS:  Constitutional: No fevers, chills, sweats, or change in appetite Eyes: see above.   Also light sensitive with mild eye pain at times Ear, nose and throat: No hearing loss, ear pain, nasal congestion, sore throat Cardiovascular: No chest pain, palpitations Respiratory:  Occ episodes of shortness of breath.  No wheezes GastrointestinaI: No nausea, vomiting, diarrhea, abdominal pain, fecal incontinence.  Mild dysphagia at times.   Genitourinary:  No dysuria, urinary retention or frequency.  No nocturia. Musculoskeletal:  Notes neck pain, back pain with some stiffness.   Integumentary: No rash, pruritus, skin lesions Neurological: as above Psychiatric: No depression at this time.  No anxiety Endocrine: No palpitations, diaphoresis, change in appetite, change in weigh or increased thirst.  Some heat intolerance.   Hematologic/Lymphatic:  No anemia, purpura, petechiae.   Sometimes bruises easily.     Allergic/Immunologic: No itchy/runny eyes, nasal congestion, recent allergic reactions, rashes  ALLERGIES: No Known Allergies  HOME MEDICATIONS: Outpatient Prescriptions Prior to Visit  Medication Sig Dispense Refill  . aspirin (ASPIRIN EC) 81 MG EC tablet Take 81 mg by mouth daily. Swallow whole.    Marland Kitchen FLUOXETINE HCL PO Take 30 mg by mouth daily.    Marland Kitchen omeprazole (PRILOSEC) 20 MG capsule Take 20 mg by mouth daily.    Marland Kitchen oxybutynin (DITROPAN) 5 MG tablet Take 5 mg by mouth daily.    Marland Kitchen rOPINIRole (REQUIP) 3 MG tablet Take 3 mg by mouth at bedtime.    . simvastatin (ZOCOR) 10 MG tablet Take 10 mg by mouth daily.    Marland Kitchen zinc sulfate 220 MG capsule Take 220 mg by mouth daily.    Marland Kitchen amantadine (SYMMETREL) 100 MG capsule Take 100 mg by mouth 2 (two) times daily.    Marland Kitchen gabapentin (NEURONTIN) 300 MG capsule Take 300 mg by mouth 2 (two) times daily. 1 tablet  at dinner and 1 tablet at bedtime    . levothyroxine (SYNTHROID, LEVOTHROID) 150 MCG tablet Take 150 mcg by mouth daily.     No facility-administered medications prior to visit.    PAST MEDICAL HISTORY: Past Medical History  Diagnosis Date  . Multiple sclerosis   . Restless leg syndrome   . Thyroid disease     Hypothyroidism  . GERD (gastroesophageal reflux disease)   . Diabetes mellitus without complication     PAST SURGICAL HISTORY: Past Surgical History  Procedure Laterality Date  . Cesarean section    . Abdominal hysterectomy      FAMILY HISTORY: No FH of MS.  Her sister and aunt have RA.  Sister has hepatitis from RA treatment  SOCIAL HISTORY:  History   Social History  . Marital Status: Married    Spouse Name: N/A    Number of Children: N/A  . Years of Education: N/A   Occupational History  . Not on file.   Social History Main Topics  . Smoking status: Former Research scientist (life sciences)  . Smokeless tobacco: Not on file  . Alcohol Use: No  . Drug Use: No  . Sexual Activity: Yes    Birth Control/ Protection: Surgical   Other  Topics Concern  . Not on file   Social History Narrative     PHYSICAL EXAM  Filed Vitals:   02/24/14 0908  BP: 126/80  Pulse: 68  Resp: 14  Height: 5\' 6"  (1.676 m)  Weight: 271 lb 12.8 oz (123.288 kg)    Body mass index is 43.89 kg/(m^2).   General: The patient is well-developed and well-nourished and in no acute distress  Eyes:  Funduscopic exam shows right optic nerve pallor and normal left optic nerve.   Normal discs and retinal vessels.  Neck: The neck is supple, no carotid bruits are noted.  The neck is mildly tender and mild reduced ROM  Respiratory: The respiratory examination is clear.  Cardiovascular: The cardiovascular examination reveals a regular rate and rhythm,  no murmurs, gallops or rubs are noted.  Skin: Extremities are without significant edema.  Neurologic Exam  Mental status: The patient is alert and oriented x 3 at the time of the examination. The patient has apparent normal recent and remote memory, with an apparently normal attention span and concentration ability.   Speech is normal.  Cranial nerves: Extraocular movements are full. Pupils are equal, round but there is right APD.  Colors are desaturated out of right eye.  [place] fields are full.  Facial symmetry is present. There is good facial sensation to soft touch bilaterally.Facial strength is normal.  Trapezius and sternocleidomastoid strength is normal. No dysarthria is noted.  The tongue is midline, and the patient has symmetric elevation of the soft palate. No obvious hearing deficits are noted.  Motor:  Muscle bulk and tone are normal. Strength is  5 / 5 in all 4 extremities.   Sensory: She reports mildly decreased sensation to touch and vibration in the left arm compared to the right arm. She has more symmetric sensation to touch and vibration and temperature in the legs.   Coordination: Cerebellar testing reveals good finger-nose-finger and heel-to-shin bilaterally.  Gait and station:  Station is normal and gait is mildly wide. Tandem gait is wide. Romberg is negative.   Reflexes: Deep tendon reflexes are symmetric and normal bilaterally. Plantar responses are normal.     DIAGNOSTIC DATA (LABS, IMAGING, TESTING) - I reviewed patient records, labs, notes, testing and imaging myself where available.  Lab Results  Component Value Date   WBC 6.6 05/12/2013   HGB 12.3 05/12/2013   HCT 37.7 05/12/2013   MCV 86.1 05/12/2013   PLT 251 05/12/2013      Component Value Date/Time   NA 143 05/12/2013 1607   K 4.1 05/12/2013 1607   CL 106 05/12/2013 1607   CO2 25 05/12/2013 1607   GLUCOSE 123* 05/12/2013 1607   BUN 13 05/12/2013 1607   CREATININE 0.77 05/12/2013 1607   CALCIUM 9.2 05/12/2013 1607   PROT 7.1 05/12/2013 1607   ALBUMIN 3.8 05/12/2013 1607   AST 18 05/12/2013 1607   ALT 20 05/12/2013 1607   ALKPHOS 95 05/12/2013 1607   BILITOT 0.4 05/12/2013 1607   GFRNONAA >90 05/12/2013 1607   GFRAA >90 05/12/2013 1607       ASSESSMENT AND PLAN Multiple sclerosis - possible MS - Plan: MR Brain W Wo Contrast, MR Cervical Spine Wo Contrast  Hypothyroidism, unspecified hypothyroidism type  Gastroesophageal reflux disease, esophagitis presence not specified  Optic neuritis - Plan: MR Brain W Wo Contrast, NMO IgG Autoantibodies  Numbness - Plan: MR Brain W Wo Contrast, MR Cervical Spine Wo Contrast, NMO IgG Autoantibodies  Chronic fatigue  Cognitive and neurobehavioral dysfunction   In summary, Elvis Boot is a 59 year old woman who had right optic neuritis in the past and has been diagnosed with multiple sclerosis. Her MRI scans of the brain did not show significant abnormalities and I wonder if she actually has multiple sclerosis. Clearly the optic neuritis is associated with a higher risk of that disease. However, I would have expected more changes over time on her MRI. Therefore, about a year and a half ago, we stopped the Avonex. She has not had any  definite exacerbation since. I will recheck an MRI of the brain contrast as she if she has had any progression.   If there is no progression history with MS, then MS becomes less likely as the vast majority of  patients would be expected to have changes over 18 months or so. I will also check an NMO antibody, as the Devic's variant of MS typically has few MRI changes in the brain. Interim, we will continue to treat her symptoms. Also check an MRI of the cervical spine as she has some gait and sensory disturbances that can't be explained by the brain MRI. We need to rule out a transverse myelitis or compressive syndrome. Medications were refilled.  She will return to see me in 4 months, or sooner if she has new or worsening neurologic symptoms.  Merrill Deanda A. Felecia Shelling, MD, PhD 9/0/2409, 7:35 AM Certified in Neurology, Clinical Neurophysiology, Sleep Medicine, Pain Medicine and Neuroimaging  [facility] [address], [place], [place] [address] [phone]

## 2014-02-26 LAB — NMO IGG AUTOANTIBODIES: NMO-IgG: <1.5 U/mL (ref 0.0–3.0)

## 2014-03-04 ENCOUNTER — Telehealth: Payer: Self-pay | Admitting: Neurology

## 2014-03-04 NOTE — Telephone Encounter (Signed)
Ins has been contacted and provided with all clinical info.  Request is currently pending.  I called the patient back.  She is aware.

## 2014-03-04 NOTE — Telephone Encounter (Signed)
Patient stated Prior authorization is needed for Rx amphetamine-dextroamphetamine (ADDERALL XR) 20 MG 24 hr capsule.  Please call and advise.

## 2014-03-17 ENCOUNTER — Other Ambulatory Visit: Payer: Self-pay | Admitting: Neurology

## 2014-03-18 ENCOUNTER — Other Ambulatory Visit: Payer: Self-pay | Admitting: *Deleted

## 2014-03-20 MED ORDER — AMANTADINE HCL 100 MG PO CAPS: 100.0000 mg | ORAL_CAPSULE | Freq: Two times a day (BID) | ORAL | Status: DC

## 2014-03-24 ENCOUNTER — Other Ambulatory Visit: Payer: PPO

## 2014-03-24 MED ORDER — AMPHETAMINE-DEXTROAMPHET ER 30 MG PO CP24
30.0000 mg | ORAL_CAPSULE | Freq: Every day | ORAL | Status: DC
Start: 2014-03-24 — End: 2014-06-20

## 2014-03-24 NOTE — Addendum Note (Signed)
Addended by: Britt Bottom on: 03/24/2014 02:57 PM   Modules accepted: Orders

## 2014-03-25 ENCOUNTER — Telehealth: Payer: Self-pay

## 2014-03-25 NOTE — Telephone Encounter (Signed)
Called patient to inform Rx ready for pick up at front desk. No answer. Left vmail.

## 2014-04-21 ENCOUNTER — Telehealth: Payer: Self-pay | Admitting: *Deleted

## 2014-04-21 ENCOUNTER — Ambulatory Visit
Admission: RE | Admit: 2014-04-21 | Discharge: 2014-04-21 | Disposition: A | Discharge status: Home / Self Care | Payer: PPO | Source: Ambulatory Visit | Attending: Neurology | Admitting: Neurology

## 2014-04-21 DIAGNOSIS — H469 Unspecified optic neuritis: Secondary | ICD-10-CM

## 2014-04-21 DIAGNOSIS — G35 Multiple sclerosis: Secondary | ICD-10-CM

## 2014-04-21 DIAGNOSIS — R200 Anesthesia of skin: Secondary | ICD-10-CM

## 2014-04-21 MED ORDER — GADOBENATE DIMEGLUMINE 529 MG/ML IV SOLN
20.0000 mL | Freq: Once | INTRAVENOUS | Status: AC | PRN
  Administered 2014-04-21: 20 mL via INTRAVENOUS

## 2014-04-21 NOTE — Telephone Encounter (Signed)
-----   Message from Britt Bottom, MD sent at 04/21/2014  4:40 PM EDT ----- MRi brain is normal.   Spinal cord is norml   Some disc degenerative changes at [facility] and C6C7 more to the left but no definite nerve root compression

## 2014-04-21 NOTE — Telephone Encounter (Signed)
Left message with female that answered for pt. to call.  No details left/fim

## 2014-04-22 ENCOUNTER — Telehealth: Payer: Self-pay | Admitting: *Deleted

## 2014-04-23 ENCOUNTER — Encounter: Payer: Self-pay | Admitting: Neurology

## 2014-04-24 NOTE — Telephone Encounter (Signed)
-----   Message from Britt Bottom, MD sent at 04/21/2014  4:40 PM EDT ----- MRi brain is normal.   Spinal cord is norml   Some disc degenerative changes at [facility] and C6C7 more to the left but no definite nerve root compression

## 2014-04-24 NOTE — Telephone Encounter (Signed)
Received Mychart message from pt. that she has mult. questions regarding mri.  Dr. Felecia Shelling aware.  Mychart message forwarded to him and he will call pt. to discuss/fim

## 2014-06-16 ENCOUNTER — Telehealth: Payer: Self-pay | Admitting: Neurology

## 2014-06-16 NOTE — Telephone Encounter (Signed)
Patient had 3 headaches last week.  Monday, Friday, Sunday and would like a call to see if there is anything she needs to do.

## 2014-06-16 NOTE — Telephone Encounter (Signed)
I have spoken with Anna Walker this afternoon.  She sts. she had h/a's on Friday 5-20, and Sunday 06-15-14.  Sts. she had photophobia assoc. with h/a's--h/a's resolved with rest.  She denies visual disturbance.  Sts. she saw her opthalmologist today and he told her he saw no changes.  If h/a's return and do not resolve, she will call me back/fim

## 2014-06-17 NOTE — Telephone Encounter (Signed)
I have spoken with Anna Walker.  She sts. she slept late this morning--woke up around 1030-11am with a h/a, and some facial numbness.  Speech is clear and deliberate.  She is oriented times 4, appropriate during our conversation.  Sts. she has a hx. of h/a's in the past.  She does have osa, but sts. cpap machine is not old and she doesn't  have any reason to believe it is not working.  Appt. given for 05-20-14 to discuss h/a's.  RAS is aware and agreeable with this plan./fim

## 2014-06-17 NOTE — Telephone Encounter (Signed)
Pt called and stated that she is having another head ache today and is also experiencing numbness in her face. She has requested to speak with Faith RN regarding these issues. Please call and advise.

## 2014-06-19 ENCOUNTER — Ambulatory Visit: Payer: Self-pay | Admitting: Neurology

## 2014-06-20 ENCOUNTER — Ambulatory Visit (INDEPENDENT_AMBULATORY_CARE_PROVIDER_SITE_OTHER): Payer: PPO | Admitting: Neurology

## 2014-06-20 ENCOUNTER — Encounter: Payer: Self-pay | Admitting: Neurology

## 2014-06-20 VITALS — BP 140/72 | HR 64 | Resp 16 | Ht 66.0 in | Wt 271.0 lb

## 2014-06-20 DIAGNOSIS — Z8669 Personal history of other diseases of the nervous system and sense organs: Secondary | ICD-10-CM

## 2014-06-20 DIAGNOSIS — G43009 Migraine without aura, not intractable, without status migrainosus: Secondary | ICD-10-CM | POA: Diagnosis not present

## 2014-06-20 DIAGNOSIS — R5383 Other fatigue: Secondary | ICD-10-CM | POA: Insufficient documentation

## 2014-06-20 DIAGNOSIS — G2581 Restless legs syndrome: Secondary | ICD-10-CM | POA: Diagnosis not present

## 2014-06-20 DIAGNOSIS — G4733 Obstructive sleep apnea (adult) (pediatric): Secondary | ICD-10-CM

## 2014-06-20 DIAGNOSIS — G35 Multiple sclerosis: Secondary | ICD-10-CM

## 2014-06-20 DIAGNOSIS — Z9989 Dependence on other enabling machines and devices: Secondary | ICD-10-CM

## 2014-06-20 MED ORDER — SUMATRIPTAN SUCCINATE 100 MG PO TABS: 100.0000 mg | ORAL_TABLET | Freq: Once | ORAL | Status: DC | PRN

## 2014-06-20 NOTE — Progress Notes (Signed)
[facility]  PATIENT: Anna Walker DOB: Oct 23, 1955  REFERRING CLINICIAN:Elaine Laurann Montana  HISTORY FROM: Patient REASON FOR VISIT: H/O optic neuritis and possible MS   HISTORICAL  CHIEF COMPLAINT:  Chief Complaint  Patient presents with  . Multiple Sclerosis    She is here today with new c/o frequent h/a's onset mid April.  H/A's usually improved with cold compress over eyes and sleep.  Sts. occasionally facial numbness with h/a.  Sts. this morning feels like right eye is twitching.  More light and sound sensitivity with h/a.  Sts. hx. of migraines about 16 yrs. ago/fim    HISTORY OF PRESENT ILLNESS:  Anna Walker is a 59 year old woman  with h/o right optic neuritis now reporting a severe headache that has fluctuated since Tuesday.       Migraine Headache:   She has a history of having more frequent migraine headaches about 16 years ago. She was never placed on a prophylactic agent but did receive sumatriptan injections on several occasions with benefit. Since then, she mostly would get rare headaches that would focus around her right eye. It would not severe as the migraines that she used to get. The current headache is unusual for her in its intensity but not its location. It is located on the right with the worst pain around the right eye. It is throbbing. She notes intensified pain with bright lights, loud noises and head movements. She has had some nausea but no vomiting. The headache started on Tuesday (3 days ago) and was severe that day but improved on Wednesday and Thursday. Then, she woke up today and the headache was even more intense. She has taken Tylenol but no other medications. She notes a little neck pain and active head pain but the pain is most intense around the right eye.   ON:   She feels vision is unchanged but she has a lot of photosensitivity   She had right optic neuritis in 1998.  A recent ophthalmological exam was consistent with a  remote right optic neuritis.  She had OCT testing but does not know the results. She reports that her eye doctor was given a send the results to [place]. In the past,  I reviewed imaging studies after that visit and felt that the MRI was essentially normal and there were no changes over 15 years. I recommended that she stopped the Avonex. A repeat MRI on 12/12/2012 that I personally reviewed was essentially normal.     Fatigue and attention deficit have been a problem for her. Her insurance did not cover Adderall. That had helped her quite a bit in the past. I went ahead and placed her on amantadine. She feels it helps some but not as much.   She still notes some mental fog and decreased attention and STM with word finding issues.   Adderall has helped some but not completely.  She also has obstructive sleep apnea and has been on CPAP since 2000. She reports being compliant with CPAP usage. Her restless leg syndrome/periodic limb movements of sleep does better on gabapentin.     REVIEW OF SYSTEMS:  Constitutional: No fevers, chills, sweats, or change in appetite Eyes: see above.   Also light sensitive with mild eye pain at times Ear, nose and throat: No hearing loss, ear pain, nasal congestion, sore throat Cardiovascular: No chest pain, palpitations Respiratory:  Occ episodes of shortness of breath.  No wheezes GastrointestinaI: No nausea, vomiting, diarrhea, abdominal pain, fecal incontinence.  Mild dysphagia at times.   Genitourinary:  No dysuria, urinary retention or frequency.  No nocturia. Musculoskeletal:  Notes neck pain, back pain with some stiffness.   Integumentary: No rash, pruritus, skin lesions Neurological: as above Psychiatric: No depression at this time.  No anxiety Endocrine: No palpitations, diaphoresis, change in appetite, change in weigh or increased thirst.  Some heat intolerance.   Hematologic/Lymphatic:  No anemia, purpura, petechiae.   Sometimes bruises easily.     Allergic/Immunologic: No itchy/runny eyes, nasal congestion, recent allergic reactions, rashes  ALLERGIES: No Known Allergies  HOME MEDICATIONS: Outpatient Prescriptions Prior to Visit  Medication Sig Dispense Refill  . amantadine (SYMMETREL) 100 MG capsule Take 1 capsule (100 mg total) by mouth 2 (two) times daily. 60 capsule 11  . aspirin (ASPIRIN EC) 81 MG EC tablet Take 81 mg by mouth daily. Swallow whole.    Marland Kitchen FLUOXETINE HCL PO Take 30 mg by mouth daily.    Marland Kitchen FREESTYLE LITE test strip   4  . gabapentin (NEURONTIN) 300 MG capsule Take 300 mg by mouth 3 (three) times daily.    . Lancets (FREESTYLE) lancets   5  . levothyroxine (SYNTHROID, LEVOTHROID) 175 MCG tablet daily.  3  . omeprazole (PRILOSEC) 20 MG capsule Take 20 mg by mouth daily.    Marland Kitchen oxybutynin (DITROPAN) 5 MG tablet Take 5 mg by mouth daily.    Marland Kitchen rOPINIRole (REQUIP) 3 MG tablet Take 3 mg by mouth at bedtime.    . simvastatin (ZOCOR) 10 MG tablet Take 10 mg by mouth daily.    Marland Kitchen zinc sulfate 220 MG capsule Take 220 mg by mouth daily.    Marland Kitchen amphetamine-dextroamphetamine (ADDERALL XR) 30 MG 24 hr capsule Take 1 capsule (30 mg total) by mouth daily. (Patient not taking: Reported on 06/20/2014) 30 capsule 0   No facility-administered medications prior to visit.    PAST MEDICAL HISTORY: Past Medical History  Diagnosis Date  . Multiple sclerosis   . Restless leg syndrome   . Thyroid disease     Hypothyroidism  . GERD (gastroesophageal reflux disease)   . Diabetes mellitus without complication     PAST SURGICAL HISTORY: Past Surgical History  Procedure Laterality Date  . Cesarean section    . Abdominal hysterectomy      FAMILY HISTORY: No FH of MS.  Her sister and aunt have RA.  Sister has hepatitis from RA treatment  SOCIAL HISTORY:  History   Social History  . Marital Status: Married    Spouse Name: N/A  . Number of Children: N/A  . Years of Education: N/A   Occupational History  . Not on file.    Social History Main Topics  . Smoking status: Former Research scientist (life sciences)  . Smokeless tobacco: Not on file  . Alcohol Use: No  . Drug Use: No  . Sexual Activity: Yes    Birth Control/ Protection: Surgical   Other Topics Concern  . Not on file   Social History Narrative     PHYSICAL EXAM  Filed Vitals:   06/20/14 1047  BP: 140/72  Pulse: 64  Resp: 16  Height: 5\' 6"  (1.676 m)  Weight: 271 lb (122.925 kg)    Body mass index is 43.76 kg/(m^2).   General: The patient is well-developed and well-nourished and in no acute distress.   Pharynx is crowded (Mallampati 3)  Eyes:  She has photosensitivity, right worse than left.  Funduscopic exam shows right optic nerve pallor and normal left optic nerve.  Normal discs and retinal vessels.  Neck: The neck is supple.  The neck is minimally tender   Skin: Extremities are without rash or significant edema.  Neurologic Exam  Mental status: The patient is alert and oriented x 3 at the time of the examination.  Speech is normal.  Cranial nerves: Extraocular movements are full. Pupils are equal, round but there is right APD.  Colors are desaturated out of right eye.   Visual fields are full.  Facial symmetry is present. There is good facial sensation to soft touch bilaterally.Facial strength is normal.  Trapezius and sternocleidomastoid strength is normal. No dysarthria is noted.  The tongue is midline, and the patient has symmetric elevation of the soft palate. No obvious hearing deficits are noted.  Motor:  Muscle bulk and tone are normal. Strength is  5 / 5 in all 4 extremities.   Sensory: She reports mildly decreased sensation to touch and vibration in the left arm compared to the right arm. She has more symmetric sensation to touch and vibration and temperature in the legs.   Coordination: Cerebellar testing reveals good finger-nose-finger.  Gait and station: Station is normal and gait is mildly wide. Tandem gait is wide.   Reflexes: Deep  tendon reflexes are symmetric and normal bilaterally.      DIAGNOSTIC DATA (LABS, IMAGING, TESTING) - I reviewed patient records, labs, notes, testing and imaging myself where available.  Lab Results  Component Value Date   WBC 6.6 05/12/2013   HGB 12.3 05/12/2013   HCT 37.7 05/12/2013   MCV 86.1 05/12/2013   PLT 251 05/12/2013      Component Value Date/Time   NA 143 05/12/2013 1607   K 4.1 05/12/2013 1607   CL 106 05/12/2013 1607   CO2 25 05/12/2013 1607   GLUCOSE 123* 05/12/2013 1607   BUN 13 05/12/2013 1607   CREATININE 0.77 05/12/2013 1607   CALCIUM 9.2 05/12/2013 1607   PROT 7.1 05/12/2013 1607   ALBUMIN 3.8 05/12/2013 1607   AST 18 05/12/2013 1607   ALT 20 05/12/2013 1607   ALKPHOS 95 05/12/2013 1607   BILITOT 0.4 05/12/2013 1607   GFRNONAA >90 05/12/2013 1607   GFRAA >90 05/12/2013 1607        ASSESSMENT AND PLAN Migraine without aura and without status migrainosus, not intractable  History of optic neuritis  OSA on CPAP  Restless leg syndrome  Other fatigue  possible MS   1.   Toradol 60 mg IM now 2.   Sumatriptan 100 mg prescription to take as needed for migraine headaches 3.   I do not think that this is related to her history of optic neuritis or possible MS. 4.   Continue CPAP for OSA. Continue other meds. 5.   Return to see me as scheduled in August or call sooner if the headaches return and are not responding to therapy or if other neurologic symptoms occur.  Richard A. Felecia Shelling, MD, PhD 09/10/5629, 49:70 AM Certified in Neurology, Clinical Neurophysiology, Sleep Medicine, Pain Medicine and Neuroimaging  [facility] [address] [place], [place] [address] [phone]

## 2014-06-24 NOTE — Telephone Encounter (Signed)
Patient called and requested to speak with Faith RN regarding a migraine that she had all weekend, she stated that Dr. Felecia Shelling requested for her to call and give a report as to how she was doing after her treatment Friday. Please call and advise.

## 2014-06-24 NOTE — Telephone Encounter (Signed)
I have spoken with Anna Walker today.  She reports having a  h/a on Sat, Sun and Mon, but sts. Imitrex helped.  Sts. she does not have a h/a today.  Per RAS, no further tx. needed at this time.  Anna Walker is aware/fim

## 2014-06-25 NOTE — Telephone Encounter (Signed)
Done

## 2014-08-25 ENCOUNTER — Ambulatory Visit (INDEPENDENT_AMBULATORY_CARE_PROVIDER_SITE_OTHER): Payer: PPO | Admitting: Neurology

## 2014-08-25 ENCOUNTER — Encounter: Payer: Self-pay | Admitting: Neurology

## 2014-08-25 VITALS — BP 140/68 | HR 89 | Temp 98.8°F | Ht 66.0 in | Wt 286.8 lb

## 2014-08-25 DIAGNOSIS — G2581 Restless legs syndrome: Secondary | ICD-10-CM | POA: Diagnosis not present

## 2014-08-25 DIAGNOSIS — G35D Multiple sclerosis, unspecified: Secondary | ICD-10-CM

## 2014-08-25 DIAGNOSIS — Z8669 Personal history of other diseases of the nervous system and sense organs: Secondary | ICD-10-CM | POA: Diagnosis not present

## 2014-08-25 DIAGNOSIS — G43009 Migraine without aura, not intractable, without status migrainosus: Secondary | ICD-10-CM

## 2014-08-25 DIAGNOSIS — R5383 Other fatigue: Secondary | ICD-10-CM

## 2014-08-25 DIAGNOSIS — G4733 Obstructive sleep apnea (adult) (pediatric): Secondary | ICD-10-CM | POA: Diagnosis not present

## 2014-08-25 DIAGNOSIS — G35 Multiple sclerosis: Secondary | ICD-10-CM

## 2014-08-25 DIAGNOSIS — Z9989 Dependence on other enabling machines and devices: Secondary | ICD-10-CM

## 2014-08-25 MED ORDER — PHENTERMINE HCL 37.5 MG PO CAPS: 37.5000 mg | ORAL_CAPSULE | ORAL | Status: DC

## 2014-08-25 NOTE — Progress Notes (Signed)
[facility]  PATIENT: Anna Walker DOB: 1955-12-09  REFERRING CLINICIAN:Elaine Laurann Walker  HISTORY FROM: Patient REASON FOR VISIT: H/O optic neuritis and possible MS   HISTORICAL  CHIEF COMPLAINT:  Chief Complaint  Patient presents with  . Follow-up    Here by herself, Room 5. Imitrex is helping her headaches, Using CPAP. Feels she needs a new mask. She is having blurry vision, slurred speech when she has a headache.     HISTORY OF PRESENT ILLNESS:  Anna Walker is a 59 year old woman  with h/o right optic neuritis and migraines  Migraine Headache:   She has only had a couple headaches since I last saw her in May 2016.    Headaches are usually right frontal when they occur.  Last HA in May was especially bad and we used Toradol with some benefit.   Imitrex seems to help he abort headaches.      ON:   She has some blurriness, worse when she is hot or tired.  Also, she has a lot of photosensitivity   She had right optic neuritis in 1998.  A recent ophthalmological exam was consistent with a remote right optic neuritis.   In the past,  I reviewed imaging studies after that visit and felt that the MRI was essentially normal and there were no changes over 15 years. I recommended that she stopped the Avonex. A repeat MRI on 04/21/2014  that I personally reviewed was essentially normal.     Fatigue/ADD:   Fatigue and attention deficit have been a problem for her. Adderall worked better than amantadine but was not covered by insurance.    She still notes some mental fog and decreased attention and STM with word finding issues.     OSA/RLS/PLMS:   She also has obstructive sleep apnea and has been on CPAP since 2000. She reports being compliant with CPAP usage. Her restless leg syndrome/periodic limb movements of sleep does better on gabapentin and ropinirole combination.   She had noted only mild sleepiness and only rarely dozes off watching TV or other activities.          REVIEW OF SYSTEMS:  Constitutional: No fevers, chills, sweats, or change in appetite Eyes: see above.   Also light sensitive with mild eye pain at times Ear, nose and throat: No hearing loss, ear pain, nasal congestion, sore throat Cardiovascular: No chest pain, palpitations Respiratory:  Occ episodes of shortness of breath.  No wheezes GastrointestinaI: No nausea, vomiting, diarrhea, abdominal pain, fecal incontinence.   Mild dysphagia at times.   Genitourinary:  No dysuria, urinary retention or frequency.  No nocturia. Musculoskeletal:  Notes neck pain, back pain with some stiffness.   Integumentary: No rash, pruritus, skin lesions Neurological: as above Psychiatric: No depression at this time.  No anxiety Endocrine: No palpitations, diaphoresis, change in appetite, change in weigh or increased thirst.  Some heat intolerance.   Hematologic/Lymphatic:  No anemia, purpura, petechiae.   Sometimes bruises easily.   Allergic/Immunologic: No itchy/runny eyes, nasal congestion, recent allergic reactions, rashes  ALLERGIES: No Known Allergies  HOME MEDICATIONS: Outpatient Prescriptions Prior to Visit  Medication Sig Dispense Refill  . amantadine (SYMMETREL) 100 MG capsule Take 1 capsule (100 mg total) by mouth 2 (two) times daily. 60 capsule 11  . aspirin (ASPIRIN EC) 81 MG EC tablet Take 81 mg by mouth daily. Swallow whole.    Marland Kitchen FLUOXETINE HCL PO Take 30 mg by mouth daily.    Marland Kitchen FREESTYLE LITE  test strip   4  . gabapentin (NEURONTIN) 300 MG capsule Take 300 mg by mouth 3 (three) times daily.    . Lancets (FREESTYLE) lancets   5  . levothyroxine (SYNTHROID, LEVOTHROID) 175 MCG tablet daily.  3  . omeprazole (PRILOSEC) 20 MG capsule Take 20 mg by mouth daily.    Marland Kitchen oxybutynin (DITROPAN) 5 MG tablet Take 5 mg by mouth daily.    Marland Kitchen rOPINIRole (REQUIP) 3 MG tablet Take 3 mg by mouth at bedtime.    . simvastatin (ZOCOR) 10 MG tablet Take 10 mg by mouth daily.    . SUMAtriptan (IMITREX)  100 MG tablet Take 1 tablet (100 mg total) by mouth once as needed for migraine. May repeat in 2 hours if headache persists or recurs. 10 tablet 2  . zinc sulfate 220 MG capsule Take 220 mg by mouth daily.     No facility-administered medications prior to visit.    PAST MEDICAL HISTORY: Past Medical History  Diagnosis Date  . Multiple sclerosis   . Restless leg syndrome   . Thyroid disease     Hypothyroidism  . GERD (gastroesophageal reflux disease)   . Diabetes mellitus without complication     PAST SURGICAL HISTORY: Past Surgical History  Procedure Laterality Date  . Cesarean section    . Abdominal hysterectomy      FAMILY HISTORY: No FH of MS.  Her sister and aunt have RA.  Sister has hepatitis from RA treatment  SOCIAL HISTORY:  History   Social History  . Marital Status: Married    Spouse Name: N/A  . Number of Children: N/A  . Years of Education: N/A   Occupational History  . Not on file.   Social History Main Topics  . Smoking status: Former Research scientist (life sciences)  . Smokeless tobacco: Not on file  . Alcohol Use: No  . Drug Use: No  . Sexual Activity: Yes    Birth Control/ Protection: Surgical   Other Topics Concern  . Not on file   Social History Narrative     PHYSICAL EXAM  Filed Vitals:   08/25/14 0936  BP: 140/68  Pulse: 89  Temp: 98.8 F (37.1 C)  TempSrc: Oral  Height: 5\' 6"  (1.676 m)  Weight: 286 lb 12.8 oz (130.092 kg)    Body mass index is 46.31 kg/(m^2).   General: The patient is well-developed and well-nourished and in no acute distress.   Pharynx is crowded (Mallampati 3)  Eyes:  She has photosensitivity, right worse than left.    Neck: The neck is supple.  The neck is minimally tender   Skin: Extremities are without rash or significant edema.  Neurologic Exam  Mental status: The patient is alert and oriented x 3 at the time of the examination.  Speech is normal.  Cranial nerves: Extraocular movements are full. Pupils are equal,  round but there is right APD.  Colors are desaturated out of right eye.    Facial symmetry is present. There is good facial sensation to soft touch bilaterally.Facial strength is normal.  Trapezius and sternocleidomastoid strength is normal. No dysarthria is noted.   No obvious hearing deficits are noted.  Motor:  Muscle bulk and tone are normal. Strength is  5 / 5 in all 4 extremities.   Sensory: She reports mildly decreased sensation to touch and vibration in the left arm compared to the right arm. She has more symmetric sensation to touch and vibration and temperature in the legs.  Coordination: Cerebellar testing reveals good finger-nose-finger.  Gait and station: Station is normal and gait is mildly wide. Tandem gait is wide.   Reflexes: Deep tendon reflexes are symmetric and normal bilaterally.      DIAGNOSTIC DATA (LABS, IMAGING, TESTING) - I reviewed patient records, labs, notes, testing and imaging myself where available.  Lab Results  Component Value Date   WBC 6.6 05/12/2013   HGB 12.3 05/12/2013   HCT 37.7 05/12/2013   MCV 86.1 05/12/2013   PLT 251 05/12/2013      Component Value Date/Time   NA 143 05/12/2013 1607   K 4.1 05/12/2013 1607   CL 106 05/12/2013 1607   CO2 25 05/12/2013 1607   GLUCOSE 123* 05/12/2013 1607   BUN 13 05/12/2013 1607   CREATININE 0.77 05/12/2013 1607   CALCIUM 9.2 05/12/2013 1607   PROT 7.1 05/12/2013 1607   ALBUMIN 3.8 05/12/2013 1607   AST 18 05/12/2013 1607   ALT 20 05/12/2013 1607   ALKPHOS 95 05/12/2013 1607   BILITOT 0.4 05/12/2013 1607   GFRNONAA >90 05/12/2013 1607   GFRAA >90 05/12/2013 1607        ASSESSMENT AND PLAN  Migraine without aura and without status migrainosus, not intractable  possible MS  History of optic neuritis  Other fatigue  Restless leg syndrome  OSA on CPAP    1.   Cointinue prn sumatriptan for migraine 2.  Trial of phentermine for fatigue and weight loss 3.   Continue CPAP for OSA.  Continue other meds. 5.   Return to see me in 4 months or sooner if problems.      Lindalee Huizinga A. Felecia Shelling, MD, PhD 05/29/3873, 64:33 AM Certified in Neurology, Clinical Neurophysiology, Sleep Medicine, Pain Medicine and Neuroimaging  [facility] [address] [place], [place] [address] [phone] d

## 2014-09-05 ENCOUNTER — Other Ambulatory Visit: Payer: Self-pay | Admitting: Neurology

## 2014-09-08 ENCOUNTER — Other Ambulatory Visit: Payer: Self-pay

## 2014-09-08 MED ORDER — GABAPENTIN 300 MG PO CAPS: 600.0000 mg | ORAL_CAPSULE | Freq: Every day | ORAL | Status: DC

## 2014-09-08 NOTE — Telephone Encounter (Signed)
Not mentioned in OV.  Pharmacy says was previously prescribed last year at [facility].

## 2014-09-24 ENCOUNTER — Encounter: Payer: Self-pay | Admitting: Neurology

## 2014-09-30 ENCOUNTER — Telehealth: Payer: Self-pay | Admitting: *Deleted

## 2014-09-30 MED ORDER — AMANTADINE HCL 100 MG PO CAPS
100.0000 mg | ORAL_CAPSULE | Freq: Three times a day (TID) | ORAL | Status: DC
Start: 2014-09-30 — End: 2015-04-06

## 2014-09-30 NOTE — Telephone Encounter (Signed)
Received email from pt. stating ins. would not cover Phentermine.  (for fatigue).  Ins. also would not cover Adderall.  She is currently taking Amantadine and feels it helps but not enough.  Per RAS, ok to increase from 100mg  bid to 100mg  tid.  If this doesn't help, next step would be Provigil.  Anna Walker confirmed she has not tried Provigil/Nuvigil in the past.  Rx. for Amantadine 100mg  #90, one po tid escribed to [facility] per [employer] [facility] request/fim

## 2014-10-24 ENCOUNTER — Other Ambulatory Visit: Payer: Self-pay | Admitting: Neurology

## 2014-12-22 ENCOUNTER — Ambulatory Visit (INDEPENDENT_AMBULATORY_CARE_PROVIDER_SITE_OTHER): Payer: PPO | Admitting: Neurology

## 2014-12-22 ENCOUNTER — Encounter: Payer: Self-pay | Admitting: Neurology

## 2014-12-22 VITALS — BP 156/72 | HR 74 | Resp 16 | Ht 66.0 in | Wt 277.4 lb

## 2014-12-22 DIAGNOSIS — Z8669 Personal history of other diseases of the nervous system and sense organs: Secondary | ICD-10-CM

## 2014-12-22 DIAGNOSIS — G43009 Migraine without aura, not intractable, without status migrainosus: Secondary | ICD-10-CM

## 2014-12-22 DIAGNOSIS — G35 Multiple sclerosis: Secondary | ICD-10-CM

## 2014-12-22 DIAGNOSIS — R5383 Other fatigue: Secondary | ICD-10-CM

## 2014-12-22 DIAGNOSIS — G4733 Obstructive sleep apnea (adult) (pediatric): Secondary | ICD-10-CM

## 2014-12-22 DIAGNOSIS — G2581 Restless legs syndrome: Secondary | ICD-10-CM | POA: Diagnosis not present

## 2014-12-22 DIAGNOSIS — Z9989 Dependence on other enabling machines and devices: Secondary | ICD-10-CM

## 2014-12-22 DIAGNOSIS — F909 Attention-deficit hyperactivity disorder, unspecified type: Secondary | ICD-10-CM

## 2014-12-22 DIAGNOSIS — F988 Other specified behavioral and emotional disorders with onset usually occurring in childhood and adolescence: Secondary | ICD-10-CM

## 2014-12-22 MED ORDER — AMPHETAMINE-DEXTROAMPHET ER 30 MG PO CP24: 30.0000 mg | ORAL_CAPSULE | Freq: Every day | ORAL | Status: DC

## 2014-12-22 NOTE — Progress Notes (Signed)
[facility]  PATIENT: Anna Walker DOB: 05/14/1955  REFERRING CLINICIAN:Elaine Laurann Montana  HISTORY FROM: Patient REASON FOR VISIT: H/O optic neuritis and possible MS   HISTORICAL  CHIEF COMPLAINT:  Chief Complaint  Patient presents with  . History of Optic Neuritis    Sts. has had a couple of episodes of blurry vision right eye--usually when she has been busier and is more fatigued.  Sts. headaches are less frequent, same severity.  Imitrex is effective.  Sts. she is compliant with CPAP for osa.  Sts. overall fatigue is improved with Phentermine.  She is no longer taking Amantadine./fim  . Headaches  . Sleep Apnea  . Fatigue    HISTORY OF PRESENT ILLNESS:  Anna Walker is a 59 year old woman  with h/o right optic neuritis and migraines  ON:   She has some blurriness, worse when she is hot or tired.  Also, she has a lot of photosensitivity   She had right optic neuritis in 1998. LP at that time showed an oligoclonal band.  MRI brain and C-spine did not show additional plaques.    A recent ophthalmological exam was consistent with a remote right optic neuritis.   I    She brought in other paperwork and repeat LP in 2002 was normal (no OCB and IgG ndex = 0.6)  I recommended that she stopped the Avonex last year and she denie any exacerbation.  A repeat MRI on 04/21/2014  that I personally reviewed was essentially normal.     Fatigue/ADD:   Fatigue and attention deficit have been a problem for her.    Phentermine monotherapy seems to be helping better than amantadine but not as well as Adderall.     Adderall was not covered by insurance.   She has Neuropsych testing form 2002 while in [place].   She was found to have ADD by Dr. Sheran Lawless and she recommended that she try stimulants.   She still notes some mental fog and decreased attention and STM with word finding issues.     OSA/RLS/PLMS:   She also has obstructive sleep apnea and has been on CPAP since 2000.  She reports being compliant with CPAP usage. Her restless leg syndrome/periodic limb movements of sleep does better on gabapentin and ropinirole combination.   She had noted only mild sleepiness and only rarely dozes off watching TV or other activities.    Migraine Headache:   She is doing better and has had only a couple headaches since 4 months ago.    Headaches are usually right frontal when they occur. One HA in 2016 was knocked out with Toradol and others with Imitrex.       REVIEW OF SYSTEMS:  Constitutional: No fevers, chills, sweats, or change in appetite Eyes: see above.  She is  light sensitive with mild eye pain at times Ear, nose and throat: No hearing loss, ear pain, nasal congestion, sore throat Cardiovascular: No chest pain, palpitations Respiratory:  Occ episodes of shortness of breath.  No wheezes GastrointestinaI: No nausea, vomiting, diarrhea, abdominal pain, fecal incontinence.   Mild dysphagia at times.  Genitourinary:  No dysuria, urinary retention or frequency.  No nocturia. Musculoskeletal:  Notes neck pain, back pain with some stiffness.   Integumentary: No rash, pruritus, skin lesions Neurological: as above Psychiatric: No depression at this time.  No anxiety Endocrine: No palpitations,  change in appetite, change in weigh or increased thirst.  Some heat intolerance.   Hematologic/Lymphatic:  No anemia, purpura,  petechiae.   She sometimes bruises easily.   Allergic/Immunologic: No itchy/runny eyes, nasal congestion, recent allergic reactions, rashes  ALLERGIES: No Known Allergies  HOME MEDICATIONS: Outpatient Prescriptions Prior to Visit  Medication Sig Dispense Refill  . aspirin (ASPIRIN EC) 81 MG EC tablet Take 81 mg by mouth daily. Swallow whole.    Marland Kitchen FLUOXETINE HCL PO Take 30 mg by mouth daily.    Marland Kitchen FREESTYLE LITE test strip   4  . gabapentin (NEURONTIN) 300 MG capsule Take 2 capsules (600 mg total) by mouth at bedtime. 60 capsule 6  . Lancets (FREESTYLE)  lancets   5  . levothyroxine (SYNTHROID, LEVOTHROID) 175 MCG tablet daily.  3  . Multiple Vitamins-Minerals (CENTRUM ADULTS PO) Take 1 tablet by mouth daily. Chewable tablet    . omeprazole (PRILOSEC) 20 MG capsule Take 20 mg by mouth daily.    . phentermine 37.5 MG capsule Take 1 capsule (37.5 mg total) by mouth every morning. 30 capsule 3  . rOPINIRole (REQUIP) 3 MG tablet TAKE 1 TABLET BY MOUTH DAILY AT DINNER AND 1 TABLET AT BEDTIME 60 tablet 6  . simvastatin (ZOCOR) 10 MG tablet Take 10 mg by mouth daily.    . SUMAtriptan (IMITREX) 100 MG tablet Take 1 tablet (100 mg total) by mouth once as needed for migraine. May repeat in 2 hours if headache persists or recurs. 10 tablet 2  . zinc sulfate 220 MG capsule Take 220 mg by mouth daily.    Marland Kitchen amantadine (SYMMETREL) 100 MG capsule Take 1 capsule (100 mg total) by mouth 3 (three) times daily. (Patient not taking: Reported on 12/22/2014) 90 capsule 1  . oxybutynin (DITROPAN) 5 MG tablet Take 5 mg by mouth daily.     No facility-administered medications prior to visit.    PAST MEDICAL HISTORY: Past Medical History  Diagnosis Date  . Multiple sclerosis ([place])   . Restless leg syndrome   . Thyroid disease     Hypothyroidism  . GERD (gastroesophageal reflux disease)   . Diabetes mellitus without complication ([place])     PAST SURGICAL HISTORY: Past Surgical History  Procedure Laterality Date  . Cesarean section    . Abdominal hysterectomy      FAMILY HISTORY: No FH of MS.  Her sister and aunt have RA.  Sister has hepatitis from RA treatment  SOCIAL HISTORY:  Social History   Social History  . Marital Status: Married    Spouse Name: N/A  . Number of Children: N/A  . Years of Education: N/A   Occupational History  . Not on file.   Social History Main Topics  . Smoking status: Former Research scientist (life sciences)  . Smokeless tobacco: Not on file  . Alcohol Use: No  . Drug Use: No  . Sexual Activity: Yes    Birth Control/ Protection: Surgical    Other Topics Concern  . Not on file   Social History Narrative     PHYSICAL EXAM  Filed Vitals:   12/22/14 1029  BP: 156/72  Pulse: 74  Resp: 16  Height: 5\' 6"  (1.676 m)  Weight: 277 lb 6.4 oz (125.828 kg)    Body mass index is 44.79 kg/(m^2).   General: The patient is well-developed and well-nourished and in no acute distress.   Pharynx is crowded (Mallampati 3)  Neurologic Exam  Mental status: The patient is alert and oriented x 3 at the time of the examination.  Speech is normal.  Cranial nerves: Extraocular movements are full.  Facial symmetry is present. There is good facial sensation to soft touch bilaterally.Facial strength is normal.  Trapezius and sternocleidomastoid strength is normal. No dysarthria is noted.   No obvious hearing deficits are noted.  Motor:  Muscle bulk and tone are normal. Strength is  5 / 5 in all 4 extremities.   Sensory: She reports mildly decreased sensation to touch and vibration in the left arm compared to the right arm. She has more symmetric sensation to touch and vibration and temperature in the legs.   Coordination: Cerebellar testing reveals good finger-nose-finger.  Gait and station: Station is normal and gait is mildly wide. Tandem gait is wide.   Reflexes: Deep tendon reflexes are symmetric and normal bilaterally.      DIAGNOSTIC DATA (LABS, IMAGING, TESTING) - I reviewed patient records, labs, notes, testing and imaging myself where available.  Lab Results  Component Value Date   WBC 6.6 05/12/2013   HGB 12.3 05/12/2013   HCT 37.7 05/12/2013   MCV 86.1 05/12/2013   PLT 251 05/12/2013      Component Value Date/Time   NA 143 05/12/2013 1607   K 4.1 05/12/2013 1607   CL 106 05/12/2013 1607   CO2 25 05/12/2013 1607   GLUCOSE 123* 05/12/2013 1607   BUN 13 05/12/2013 1607   CREATININE 0.77 05/12/2013 1607   CALCIUM 9.2 05/12/2013 1607   PROT 7.1 05/12/2013 1607   ALBUMIN 3.8 05/12/2013 1607   AST 18  05/12/2013 1607   ALT 20 05/12/2013 1607   ALKPHOS 95 05/12/2013 1607   BILITOT 0.4 05/12/2013 1607   GFRNONAA >90 05/12/2013 1607   GFRAA >90 05/12/2013 1607        ASSESSMENT AND PLAN  possible MS  History of optic neuritis  Restless leg syndrome  Other fatigue  Migraine without aura and without status migrainosus, not intractable  OSA on CPAP  Attention deficit disorder    1.   Adderall XR for ADD and fatigue.  If covered will stop phentermine 2.   Continue sumatriptan for migraine 3.   Continue CPAP for OSA. Continue other meds. 4.   Continue other medications 5.    Try to remain active and exercise as tolerated.  Return to see me in 6 months or sooner if problems.      Ozil Stettler A. Felecia Shelling, MD, PhD [id number], [id number] AM Certified in Neurology, Clinical Neurophysiology, Sleep Medicine, Pain Medicine and Neuroimaging  [facility] [address], [place], [place] [address] [phone] d1

## 2015-01-08 ENCOUNTER — Telehealth: Payer: Self-pay | Admitting: Neurology

## 2015-01-08 NOTE — Telephone Encounter (Signed)
Renee with [employer] is calling regarding prior authorization for medication amphetamine-dextroamphetamine (ADDERALL XR) 30 MG 24 hr capsule for the patient. Please call and advise.  [id number]. Thank you.

## 2015-01-08 NOTE — Telephone Encounter (Signed)
Ins has been contacted and provided with additional clinical info.  Request is under review Ref # [id number] - [id number]  [facility] ([place] has approved the request for coverage on Adderall effective until 01/24/2016 Ref ID # [id number]

## 2015-01-25 DIAGNOSIS — J189 Pneumonia, unspecified organism: Secondary | ICD-10-CM

## 2015-01-25 HISTORY — DX: Pneumonia, unspecified organism: J18.9

## 2015-02-04 ENCOUNTER — Encounter: Payer: Self-pay | Admitting: Neurology

## 2015-02-05 DIAGNOSIS — E039 Hypothyroidism, unspecified: Secondary | ICD-10-CM | POA: Diagnosis not present

## 2015-02-08 ENCOUNTER — Observation Stay (HOSPITAL_COMMUNITY)
Admission: EM | Admit: 2015-02-08 | Discharge: 2015-02-10 | Disposition: A | Discharge status: Home / Self Care | Payer: PPO | Attending: Internal Medicine | Admitting: Internal Medicine

## 2015-02-08 ENCOUNTER — Emergency Department (HOSPITAL_COMMUNITY): Payer: PPO

## 2015-02-08 ENCOUNTER — Encounter (HOSPITAL_COMMUNITY): Payer: Self-pay | Admitting: *Deleted

## 2015-02-08 DIAGNOSIS — K219 Gastro-esophageal reflux disease without esophagitis: Secondary | ICD-10-CM | POA: Diagnosis not present

## 2015-02-08 DIAGNOSIS — G4733 Obstructive sleep apnea (adult) (pediatric): Secondary | ICD-10-CM | POA: Diagnosis present

## 2015-02-08 DIAGNOSIS — R0609 Other forms of dyspnea: Secondary | ICD-10-CM | POA: Insufficient documentation

## 2015-02-08 DIAGNOSIS — R079 Chest pain, unspecified: Secondary | ICD-10-CM | POA: Diagnosis not present

## 2015-02-08 DIAGNOSIS — E119 Type 2 diabetes mellitus without complications: Secondary | ICD-10-CM | POA: Insufficient documentation

## 2015-02-08 DIAGNOSIS — Z9989 Dependence on other enabling machines and devices: Secondary | ICD-10-CM

## 2015-02-08 DIAGNOSIS — Z79899 Other long term (current) drug therapy: Secondary | ICD-10-CM | POA: Diagnosis not present

## 2015-02-08 DIAGNOSIS — R0789 Other chest pain: Secondary | ICD-10-CM | POA: Diagnosis not present

## 2015-02-08 DIAGNOSIS — G2581 Restless legs syndrome: Secondary | ICD-10-CM | POA: Diagnosis present

## 2015-02-08 DIAGNOSIS — Z7982 Long term (current) use of aspirin: Secondary | ICD-10-CM | POA: Diagnosis not present

## 2015-02-08 DIAGNOSIS — G35 Multiple sclerosis: Secondary | ICD-10-CM | POA: Diagnosis present

## 2015-02-08 DIAGNOSIS — E039 Hypothyroidism, unspecified: Secondary | ICD-10-CM | POA: Diagnosis not present

## 2015-02-08 DIAGNOSIS — G35D Multiple sclerosis, unspecified: Secondary | ICD-10-CM | POA: Diagnosis present

## 2015-02-08 DIAGNOSIS — R0602 Shortness of breath: Secondary | ICD-10-CM | POA: Diagnosis not present

## 2015-02-08 DIAGNOSIS — E785 Hyperlipidemia, unspecified: Secondary | ICD-10-CM

## 2015-02-08 DIAGNOSIS — Z87891 Personal history of nicotine dependence: Secondary | ICD-10-CM | POA: Insufficient documentation

## 2015-02-08 DIAGNOSIS — R072 Precordial pain: Secondary | ICD-10-CM | POA: Diagnosis present

## 2015-02-08 DIAGNOSIS — G43009 Migraine without aura, not intractable, without status migrainosus: Secondary | ICD-10-CM

## 2015-02-08 DIAGNOSIS — M792 Neuralgia and neuritis, unspecified: Secondary | ICD-10-CM

## 2015-02-08 HISTORY — DX: Obstructive sleep apnea (adult) (pediatric): G47.33

## 2015-02-08 LAB — CBC
HCT: 35.5 % — ABNORMAL LOW (ref 36.0–46.0)
HEMATOCRIT: 37.9 % (ref 36.0–46.0)
HEMOGLOBIN: 12.2 g/dL (ref 12.0–15.0)
HEMOGLOBIN: 11.2 g/dL — AB (ref 12.0–15.0)
MCH: 27.4 pg (ref 26.0–34.0)
MCH: 26.9 pg (ref 26.0–34.0)
MCHC: 32.2 g/dL (ref 30.0–36.0)
MCHC: 31.5 g/dL (ref 30.0–36.0)
MCV: 85.2 fL (ref 78.0–100.0)
MCV: 85.1 fL (ref 78.0–100.0)
PLATELETS: 235 10*3/uL (ref 150–400)
Platelets: 239 10*3/uL (ref 150–400)
RBC: 4.45 MIL/uL (ref 3.87–5.11)
RBC: 4.17 MIL/uL (ref 3.87–5.11)
RDW: 14 % (ref 11.5–15.5)
RDW: 14.1 % (ref 11.5–15.5)
WBC: 7.6 10*3/uL (ref 4.0–10.5)
WBC: 6.5 10*3/uL (ref 4.0–10.5)

## 2015-02-08 LAB — BASIC METABOLIC PANEL
ANION GAP: 11 (ref 5–15)
BUN: 16 mg/dL (ref 6–20)
CALCIUM: 9.2 mg/dL (ref 8.9–10.3)
CO2: 27 mmol/L (ref 22–32)
Chloride: 103 mmol/L (ref 101–111)
Creatinine, Ser: 0.8 mg/dL (ref 0.44–1.00)
GFR calc Af Amer: >60 mL/min (ref >60)
Glucose, Bld: 116 mg/dL — ABNORMAL HIGH (ref 65–99)
POTASSIUM: 4.2 mmol/L (ref 3.5–5.1)
Sodium: 141 mmol/L (ref 135–145)

## 2015-02-08 LAB — I-STAT TROPONIN, ED: Troponin i, poc: 0 ng/mL (ref 0.00–0.08)

## 2015-02-08 LAB — TROPONIN I

## 2015-02-08 LAB — GLUCOSE, CAPILLARY
GLUCOSE-CAPILLARY: 96 mg/dL (ref 65–99)
GLUCOSE-CAPILLARY: 108 mg/dL — AB (ref 65–99)

## 2015-02-08 LAB — CREATININE, SERUM: CREATININE: 0.85 mg/dL (ref 0.44–1.00)

## 2015-02-08 MED ORDER — NITROGLYCERIN 0.4 MG SL SUBL
0.4000 mg | SUBLINGUAL_TABLET | SUBLINGUAL | Status: AC | PRN
  Administered 2015-02-08 (×3): 0.4 mg via SUBLINGUAL
  Filled 2015-02-08 (×2): qty 1

## 2015-02-08 MED ORDER — INSULIN ASPART 100 UNIT/ML ~~LOC~~ SOLN
0.0000 [IU] | Freq: Three times a day (TID) | SUBCUTANEOUS | Status: DC
  Administered 2015-02-10: 1 [IU] via SUBCUTANEOUS

## 2015-02-08 MED ORDER — ASPIRIN EC 81 MG PO TBEC
81.0000 mg | DELAYED_RELEASE_TABLET | Freq: Every day | ORAL | Status: DC
  Administered 2015-02-09 – 2015-02-10 (×2): 81 mg via ORAL
  Filled 2015-02-08 (×2): qty 1

## 2015-02-08 MED ORDER — PANTOPRAZOLE SODIUM 40 MG PO TBEC
40.0000 mg | DELAYED_RELEASE_TABLET | Freq: Every day | ORAL | Status: DC
  Administered 2015-02-09 – 2015-02-10 (×2): 40 mg via ORAL
  Filled 2015-02-08 (×2): qty 1

## 2015-02-08 MED ORDER — MORPHINE SULFATE (PF) 2 MG/ML IV SOLN: 2.0000 mg | INTRAVENOUS | Status: DC | PRN

## 2015-02-08 MED ORDER — ACETAMINOPHEN 325 MG PO TABS
650.0000 mg | ORAL_TABLET | ORAL | Status: DC | PRN
  Administered 2015-02-08 – 2015-02-10 (×3): 650 mg via ORAL
  Filled 2015-02-08 (×3): qty 2

## 2015-02-08 MED ORDER — LEVOTHYROXINE SODIUM 75 MCG PO TABS
175.0000 ug | ORAL_TABLET | Freq: Every day | ORAL | Status: DC
  Administered 2015-02-09 – 2015-02-10 (×2): 175 ug via ORAL
  Filled 2015-02-08 (×2): qty 1

## 2015-02-08 MED ORDER — ENOXAPARIN SODIUM 40 MG/0.4ML ~~LOC~~ SOLN
40.0000 mg | SUBCUTANEOUS | Status: DC
  Administered 2015-02-08 – 2015-02-09 (×2): 40 mg via SUBCUTANEOUS
  Filled 2015-02-08 (×2): qty 0.4

## 2015-02-08 MED ORDER — GI COCKTAIL ~~LOC~~
30.0000 mL | Freq: Four times a day (QID) | ORAL | Status: DC | PRN
  Administered 2015-02-08 – 2015-02-09 (×2): 30 mL via ORAL
  Filled 2015-02-08 (×2): qty 30

## 2015-02-08 MED ORDER — CENTRUM ADULTS PO TABS: ORAL_TABLET | Freq: Every day | ORAL | Status: DC

## 2015-02-08 MED ORDER — ADULT MULTIVITAMIN W/MINERALS CH
1.0000 | ORAL_TABLET | Freq: Every day | ORAL | Status: DC
  Administered 2015-02-09 – 2015-02-10 (×2): 1 via ORAL
  Filled 2015-02-08 (×2): qty 1

## 2015-02-08 MED ORDER — TIZANIDINE HCL 4 MG PO TABS
4.0000 mg | ORAL_TABLET | Freq: Four times a day (QID) | ORAL | Status: DC | PRN
  Administered 2015-02-08: 4 mg via ORAL
  Filled 2015-02-08 (×2): qty 1

## 2015-02-08 MED ORDER — ASPIRIN 81 MG PO CHEW
324.0000 mg | CHEWABLE_TABLET | Freq: Once | ORAL | Status: AC
  Administered 2015-02-08: 324 mg via ORAL
  Filled 2015-02-08: qty 4

## 2015-02-08 MED ORDER — ROPINIROLE HCL 1 MG PO TABS: 3.0000 mg | ORAL_TABLET | Freq: Two times a day (BID) | ORAL | Status: DC

## 2015-02-08 MED ORDER — ONDANSETRON HCL 4 MG/2ML IJ SOLN: 4.0000 mg | Freq: Four times a day (QID) | INTRAMUSCULAR | Status: DC | PRN

## 2015-02-08 MED ORDER — AMANTADINE HCL 100 MG PO CAPS
100.0000 mg | ORAL_CAPSULE | Freq: Three times a day (TID) | ORAL | Status: DC
  Administered 2015-02-08 – 2015-02-10 (×3): 100 mg via ORAL
  Filled 2015-02-08 (×8): qty 1

## 2015-02-08 MED ORDER — FLUOXETINE HCL 20 MG PO CAPS
30.0000 mg | ORAL_CAPSULE | Freq: Every day | ORAL | Status: DC
  Administered 2015-02-08 – 2015-02-10 (×3): 30 mg via ORAL
  Filled 2015-02-08 (×3): qty 1

## 2015-02-08 MED ORDER — ROPINIROLE HCL 1 MG PO TABS
3.0000 mg | ORAL_TABLET | Freq: Two times a day (BID) | ORAL | Status: DC
  Administered 2015-02-08 – 2015-02-09 (×2): 3 mg via ORAL
  Filled 2015-02-08 (×3): qty 3

## 2015-02-08 MED ORDER — SIMVASTATIN 10 MG PO TABS
10.0000 mg | ORAL_TABLET | Freq: Every day | ORAL | Status: DC
  Administered 2015-02-08 – 2015-02-10 (×3): 10 mg via ORAL
  Filled 2015-02-08 (×3): qty 1

## 2015-02-08 MED ORDER — ASPIRIN 81 MG PO TBEC: 81.0000 mg | DELAYED_RELEASE_TABLET | Freq: Every day | ORAL | Status: DC

## 2015-02-08 MED ORDER — GABAPENTIN 300 MG PO CAPS
600.0000 mg | ORAL_CAPSULE | Freq: Every day | ORAL | Status: DC
  Administered 2015-02-08 – 2015-02-09 (×2): 600 mg via ORAL
  Filled 2015-02-08 (×2): qty 2

## 2015-02-08 NOTE — ED Provider Notes (Signed)
CSN: [id number]     Arrival date & time 02/08/15  [id number] History   First MD Initiated Contact with Patient 02/08/15 1132     Chief Complaint  Patient presents with  . Chest Pain     (Consider location/radiation/quality/duration/timing/severity/associated sxs/prior Treatment) HPI  60 year old female presents with chest pressure/squeezing sensation since around 7 AM. Did not sleep well last night and then noticed around 7 AM she developed chest pain. Pain feels like it is squeezing from the front and the back in between her shoulder blades. Pain several radiate straight through her chest. Initially was around a 7/10, now that she is here in the ER she states is more like a 4/10. There are shortness of breath, significant worse with minimal exertion which is atypical. She states she walked to the shower and got very short of breath. No leg swelling or leg pain. No vomiting or diaphoresis. No radiation of the pain. Patient has a history of type 2 diabetes that is currently not on medicines as well as she is being treated for hyperlipidemia. Is a former smoker over 30 years ago, denies a known history of CAD or family history of CAD. States she had a negative stress test 2 years ago.   Past Medical History  Diagnosis Date  . Multiple sclerosis ([place])   . Restless leg syndrome   . Thyroid disease     Hypothyroidism  . GERD (gastroesophageal reflux disease)   . Diabetes mellitus without complication [facility])    Past Surgical History  Procedure Laterality Date  . Cesarean section    . Abdominal hysterectomy     No family history on file. Social History  Substance Use Topics  . Smoking status: Former Smoker    Quit date: 01/25/1984  . Smokeless tobacco: None  . Alcohol Use: No   OB History    No data available     Review of Systems  Constitutional: Negative for fever and diaphoresis.  Respiratory: Positive for shortness of breath.   Cardiovascular: Positive for chest pain. Negative for leg  swelling.  Gastrointestinal: Negative for vomiting and abdominal pain.  All other systems reviewed and are negative.     Allergies  Review of patient's allergies indicates no known allergies.  Home Medications   Prior to Admission medications   Medication Sig Start Date End Date Taking? Authorizing Provider  amantadine (SYMMETREL) 100 MG capsule Take 1 capsule (100 mg total) by mouth 3 (three) times daily. Patient not taking: Reported on 12/22/2014 09/30/14   Britt Bottom, MD  amphetamine-dextroamphetamine (ADDERALL XR) 30 MG 24 hr capsule Take 1 capsule (30 mg total) by mouth daily. 12/22/14   Britt Bottom, MD  aspirin (ASPIRIN EC) 81 MG EC tablet Take 81 mg by mouth daily. Swallow whole.    Historical Provider, MD  FLUOXETINE HCL PO Take 30 mg by mouth daily.    Historical Provider, MD  FREESTYLE LITE test strip  02/07/14   Historical Provider, MD  gabapentin (NEURONTIN) 300 MG capsule Take 2 capsules (600 mg total) by mouth at bedtime. 09/08/14   Britt Bottom, MD  Lancets (FREESTYLE) lancets  02/05/14   Historical Provider, MD  levothyroxine (SYNTHROID, LEVOTHROID) 175 MCG tablet daily. 01/26/14   Historical Provider, MD  Multiple Vitamins-Minerals (CENTRUM ADULTS PO) Take 1 tablet by mouth daily. Chewable tablet    Historical Provider, MD  omeprazole (PRILOSEC) 20 MG capsule Take 20 mg by mouth daily.    Historical Provider, MD  oxybutynin [facility])  5 MG tablet Take 5 mg by mouth daily.    Historical Provider, MD  phentermine 37.5 MG capsule Take 1 capsule (37.5 mg total) by mouth every morning. 08/25/14   Britt Bottom, MD  rOPINIRole (REQUIP) 3 MG tablet TAKE 1 TABLET BY MOUTH DAILY AT [facility] AND 1 TABLET AT BEDTIME 10/25/14   Britt Bottom, MD  simvastatin (ZOCOR) 10 MG tablet Take 10 mg by mouth daily.    Historical Provider, MD  SUMAtriptan (IMITREX) 100 MG tablet Take 1 tablet (100 mg total) by mouth once as needed for migraine. May repeat in 2 hours if headache persists or  recurs. 06/20/14   Britt Bottom, MD  tiZANidine (ZANAFLEX) 4 MG tablet Take 4 mg by mouth every 6 (six) hours as needed for muscle spasms.    Historical Provider, MD  zinc sulfate 220 MG capsule Take 220 mg by mouth daily.    Historical Provider, MD   BP 117/90 mmHg  Pulse 78  Temp(Src) 98.4 F (36.9 C)  Resp 16  Ht 5\' 6"  (1.676 m)  Wt 270 lb (122.471 kg)  BMI 43.60 kg/m2  SpO2 96% Physical Exam  Constitutional: She is oriented to person, place, and time. She appears well-developed and well-nourished.  HENT:  Head: Normocephalic and atraumatic.  Right Ear: External ear normal.  Left Ear: External ear normal.  Nose: Nose normal.  Eyes: Right eye exhibits no discharge. Left eye exhibits no discharge.  Cardiovascular: Normal rate, regular rhythm and normal heart sounds.   Pulmonary/Chest: Effort normal and breath sounds normal. She has no rales. She exhibits no tenderness.  Abdominal: Soft. There is no tenderness.  Musculoskeletal: She exhibits no edema.  Neurological: She is alert and oriented to person, place, and time.  Skin: Skin is warm and dry. She is not diaphoretic.  Nursing note and vitals reviewed.   ED Course  Procedures (including critical care time) Labs Review Labs Reviewed  BASIC METABOLIC PANEL - Abnormal; Notable for the following:    Glucose, Bld 116 (*)    All other components within normal limits  CBC - Abnormal; Notable for the following:    Hemoglobin 11.2 (*)    HCT 35.5 (*)    All other components within normal limits  CBC  TROPONIN I  TROPONIN I  TROPONIN I  CREATININE, SERUM  HEMOGLOBIN A1C  TSH  LIPID PANEL  BASIC METABOLIC PANEL  I-STAT TROPOININ, ED    Imaging Review Dg Chest 2 View  02/08/2015  CLINICAL DATA:  Acute onset chest pain radiating to back beginning this morning. Shortness of breath. EXAM: CHEST  2 VIEW COMPARISON:  05/12/2013 FINDINGS: The heart size and mediastinal contours are within normal limits. Both lungs are clear.  No evidence of pneumothorax or pleural effusion. The visualized skeletal structures are unremarkable. IMPRESSION: No active cardiopulmonary disease. Electronically Signed   By: Earle Gell M.D.   On: 02/08/2015 11:30   I have personally reviewed and evaluated these images and lab results as part of my medical decision-making.   EKG Interpretation   Date/Time:  Sunday February 08 2015 09:43:57 EST Ventricular Rate:  90 PR Interval:  134 QRS Duration: 70 QT Interval:  360 QTC Calculation: 440 R Axis:   83 Text Interpretation:  Normal sinus rhythm Low voltage QRS Nonspecific ST  and T wave abnormality Abnormal ECG Confirmed by Arless Vineyard  MD, Starlina Lapre  ([id number]) on 02/08/2015 11:32:42 AM       EKG Interpretation  Date/Time:  Sunday  February 08 2015 12:10:55 EST Ventricular Rate:  82 PR Interval:  138 QRS Duration: 84 QT Interval:  380 QTC Calculation: 444 R Axis:   83 Text Interpretation:  Sinus rhythm Low voltage, precordial leads ST/T waves appear improved from earlier in the day Confirmed by [place] ([id number]) on 02/08/2015 12:15:58 PM       MDM   Final diagnoses:  Substernal chest pain    Patient's chest pain is concerning for possible cardiac etiology. After 2 nitroglycerin her chest pain is completely resolved. She has nonspecific ST/T-wave changes on initial EKG but second EKG is now more normal. Initial troponin is negative. She is pain-free with a negative troponin. She will be admitted to the hospitalist. I think PE is unlikely. Patient was given full dose aspirin as well.     Sherwood Gambler, MD 02/08/15 [phone]

## 2015-02-08 NOTE — ED Notes (Signed)
Pt c/o Mid radiating CP radiating to the mid back onset 7am today, pt c/o SOB, denies n/v/d, A&O x4

## 2015-02-08 NOTE — H&P (Addendum)
[facility] History and Physical  Anna Walker [id number] DOB: 1955-10-07 DOA: 02/08/2015  Referring physician: ED physician PCP: Osborne Casco, MD   Chief Complaint: substernal chest pain  HPI:  Anna Walker is a 59yo woman with PMH of optic neuritis/MS, DM2 (on no meds due to hypoglycemia), RLS, hypothyroidism, GERD, HLD, OSA on CPAP who presents for substernal chest pain.  She reports the pain started this morning while in bed, but did not awake her from sleep.  She felt the pain was like pressure which was pushing on the middle of her chest and from the back.  At its worst it was 7/10.  It was worse with getting ready to go to church this morning and better with rest.   Rest brought it down to 4/10 and nitro and aspirin given in the ED brought it down to 0/10.  She had associated SOB.  She denies nausea or diaphoresis.  The pain was continuous until medications were given.  She has had chest pain like this before once a few years ago which was worked up and she reports only being found to have an "enlarged heart."  She is a former smoker and she quit in 1986.  She has no FH of CAD.  She notes that her father likely died of an blood clock after an accident with a horse.   She is listed as taking adderall, but she notes that it raised her BP too much and so she stopped about 1-2 weeks ago.  The chest pain is not reproducible.  She has some chronic weakness, fatigue which have been attributed to her possible MS.  There was suggestion by ED pharmacy of possible zinc toxicity, which can lead to copper deficiency and therefore a myelopathy.  This is a chronic issue so likely would not need to be worked up in the inpatient setting.  I have stopped her zinc supplementation.    Assessment and Plan:  Substernal chest pain - Heart score is 4 - Chest pain is somewhat typical, improved with rest, substernal and she has risk factors, admit to obs for rule out.  - Troponin X 3 - first  normal - EKG as noted below, initially with some abnormalities which improved - Nitro and morphine PRN - EKG for any changes - Telemetry - Reports she previously saw Dr. Wynonia Lawman when she had a similar presentation, but I cannot find the records on brief review of chart.   possible MS - On amantadine, continue - Gabapentin for nerve pain - Zanaflex PRN    Hypothyroidism - Check TSH - Continue synthroid    HLD - check lipid panel - Continue simvastatin  GERD (gastroesophageal reflux disease) - Continue home omeprazole    OSA on CPAP - CPAP while in hospital    Restless leg syndrome - Continue ropinirole    Migraine without aura and without status migrainosus, not intractable - holding imitrex given possible CAD and no current migraine  DVT PPx: Lovenox  Diet: Heart healthy, NPO at midnight for possible imaging tomorrow if needed.    Radiological Exams on Admission: Dg Chest 2 View  02/08/2015  CLINICAL DATA:  Acute onset chest pain radiating to back beginning this morning. Shortness of breath. EXAM: CHEST  2 VIEW COMPARISON:  05/12/2013 FINDINGS: The heart size and mediastinal contours are within normal limits. Both lungs are clear. No evidence of pneumothorax or pleural effusion. The visualized skeletal structures are unremarkable. IMPRESSION: No active cardiopulmonary disease. Electronically Signed  By: Earle Gell M.D.   On: 02/08/2015 11:30    Code Status: Full Family Communication: Pt at bedside Disposition Plan: Admit for further evaluation    Gilles Chiquito, MD [phone]   Review of Systems:  Constitutional: + for chronic malaise/fatigue. Negative for fever, chills.  Negative for diaphoresis.  HENT: Negative for hearing loss, ear pain Eyes: + for vision changes, chronic. Negative for double vision, photophobia, pain Respiratory: + for SOB Negative for cough, sputum production, wheezing and stridor.   Cardiovascular: + for chest pain Negative for chest pain,  palpitations, orthopnea, claudication and leg swelling.  Gastrointestinal: Negative for nausea, vomiting and abdominal pain.  Genitourinary: Negative for dysuria, urgency Musculoskeletal: + for chronic pains in the back and neck Negative for myalgias, joint pain and falls.  Skin: Negative for itching and rash.  Neurological: + for occasional lightheadedness and dizziness.  Negative for tingling, tremors Endo/Heme/Allergies: Negative for environmental allergies and polydipsia. Does not bruise/bleed easily.     Past Medical History  Diagnosis Date  . Multiple sclerosis ([place])   . Restless leg syndrome   . Thyroid disease     Hypothyroidism  . GERD (gastroesophageal reflux disease)   . Diabetes mellitus without complication ([place])   . OSA (obstructive sleep apnea)     Past Surgical History  Procedure Laterality Date  . Cesarean section    . Abdominal hysterectomy      Social History:  reports that she quit smoking about 31 years ago. She does not have any smokeless tobacco history on file. She reports that she does not drink alcohol or use illicit drugs.  No Known Allergies  Family History  Problem Relation Age of Onset  . Coronary artery disease Neg Hx   . Barrett's esophagus Mother   . Graves' disease Sister   . Rheum arthritis Sister     Prior to Admission medications   Medication Sig Start Date End Date Taking? Authorizing Provider  amantadine (SYMMETREL) 100 MG capsule Take 1 capsule (100 mg total) by mouth 3 (three) times daily. 09/30/14  Yes Britt Bottom, MD  aspirin (ASPIRIN EC) 81 MG EC tablet Take 81 mg by mouth daily. Swallow whole.   Yes Historical Provider, MD  FLUOXETINE HCL PO Take 30 mg by mouth daily.   Yes Historical Provider, MD  FREESTYLE LITE test strip  02/07/14  Yes Historical Provider, MD  gabapentin (NEURONTIN) 300 MG capsule Take 2 capsules (600 mg total) by mouth at bedtime. 09/08/14  Yes Britt Bottom, MD  Lancets (FREESTYLE) lancets  02/05/14  Yes  Historical Provider, MD  levothyroxine (SYNTHROID, LEVOTHROID) 175 MCG tablet daily. 01/26/14  Yes Historical Provider, MD  Multiple Vitamins-Minerals (CENTRUM ADULTS PO) Take 1 tablet by mouth daily. Chewable tablet   Yes Historical Provider, MD  omeprazole (PRILOSEC) 20 MG capsule Take 20 mg by mouth daily.   Yes Historical Provider, MD  rOPINIRole (REQUIP) 3 MG tablet TAKE 1 TABLET BY MOUTH DAILY AT [facility] AND 1 TABLET AT BEDTIME 10/25/14  Yes Britt Bottom, MD  simvastatin (ZOCOR) 10 MG tablet Take 10 mg by mouth daily.   Yes Historical Provider, MD  SUMAtriptan (IMITREX) 100 MG tablet Take 1 tablet (100 mg total) by mouth once as needed for migraine. May repeat in 2 hours if headache persists or recurs. 06/20/14  Yes Britt Bottom, MD  tiZANidine (ZANAFLEX) 4 MG tablet Take 4 mg by mouth every 6 (six) hours as needed for muscle spasms.   Yes  Historical Provider, MD  zinc sulfate 220 MG capsule Take 220 mg by mouth daily.   Yes Historical Provider, MD                  Physical Exam: Filed Vitals:   02/08/15 1230 02/08/15 1245 02/08/15 1300 02/08/15 1315  BP: 103/66 96/59 117/70 102/65  Pulse: 76 79 74 71  Temp:      Resp: 16 20 11 16   Height:      Weight:      SpO2: 98% 97% 100% 99%    Physical Exam  Constitutional: Appears well-developed and well-nourished. No distress.  HENT: Normocephalic.  Oropharynx is clear and moist.  Eyes: Conjunctivae are normal. PERRLA, no scleral icterus.  Neck: Normal ROM. Neck supple.  CVS: RR, NR, S1/S2 +, no murmurs, pulses equal in all extremities Pulmonary: Effort and breath sounds normal, no rhonchi, wheezes, rales.  Abdominal: Soft. BS +,  no distension, tenderness Musculoskeletal: No edema and no tenderness.  No reproducible chest pain Neuro: Alert. Normal muscle tone.  Skin: Skin is warm and dry. No rash noted. Not diaphoretic. No erythema. No pallor.  Psychiatric: Normal mood and affect. Behavior, judgment, thought content normal.    Labs on Admission:  Basic Metabolic Panel:  Recent Labs Lab 02/08/15 1026  NA 141  K 4.2  CL 103  CO2 27  GLUCOSE 116*  BUN 16  CREATININE 0.80  CALCIUM 9.2   CBC:  Recent Labs Lab 02/08/15 1026  WBC 7.6  HGB 12.2  HCT 37.9  MCV 85.2  PLT 239   Troponin: 0.00  EKG 0943: Possible flattening of Twavews in V2-V3, scooping of the ST segment in lateral leads, nonspecific changes.    EKG 1210: Twave flattening no longer seen.  ST segment changes normalized.  Some baseline wavering.  No concerning change from previous one earlier in the day.     If 7PM-7AM, please contact night-coverage [email or web address] Password [email or web address] 02/08/2015, 1:33 PM

## 2015-02-08 NOTE — Progress Notes (Addendum)
Pt complaint of 6/10 substernal chest pressure. 1 SL nitro given. CP now resolved. MD notified, EKG obtained. Will continue to monitor.

## 2015-02-08 NOTE — Progress Notes (Signed)
RT NOTE:  Pr has her home CPAP at bedside and declines RT assistance. Will call if RT needed.

## 2015-02-09 ENCOUNTER — Other Ambulatory Visit (HOSPITAL_COMMUNITY): Payer: PPO

## 2015-02-09 ENCOUNTER — Observation Stay (HOSPITAL_COMMUNITY): Payer: PPO

## 2015-02-09 DIAGNOSIS — G35 Multiple sclerosis: Secondary | ICD-10-CM | POA: Diagnosis not present

## 2015-02-09 DIAGNOSIS — G43009 Migraine without aura, not intractable, without status migrainosus: Secondary | ICD-10-CM | POA: Diagnosis not present

## 2015-02-09 DIAGNOSIS — R079 Chest pain, unspecified: Secondary | ICD-10-CM | POA: Diagnosis not present

## 2015-02-09 DIAGNOSIS — R0609 Other forms of dyspnea: Secondary | ICD-10-CM

## 2015-02-09 DIAGNOSIS — G4733 Obstructive sleep apnea (adult) (pediatric): Secondary | ICD-10-CM | POA: Diagnosis not present

## 2015-02-09 DIAGNOSIS — R072 Precordial pain: Secondary | ICD-10-CM | POA: Diagnosis not present

## 2015-02-09 DIAGNOSIS — E039 Hypothyroidism, unspecified: Secondary | ICD-10-CM | POA: Diagnosis not present

## 2015-02-09 DIAGNOSIS — K219 Gastro-esophageal reflux disease without esophagitis: Secondary | ICD-10-CM | POA: Diagnosis not present

## 2015-02-09 LAB — LIPID PANEL
CHOL/HDL RATIO: 4.6 ratio
Cholesterol: 157 mg/dL (ref 0–200)
HDL: 34 mg/dL — AB (ref >40)
LDL CALC: 72 mg/dL (ref 0–99)
TRIGLYCERIDES: 256 mg/dL — AB (ref <150)
VLDL: 51 mg/dL — AB (ref 0–40)

## 2015-02-09 LAB — BASIC METABOLIC PANEL
ANION GAP: 10 (ref 5–15)
BUN: 15 mg/dL (ref 6–20)
CALCIUM: 9.1 mg/dL (ref 8.9–10.3)
CHLORIDE: 103 mmol/L (ref 101–111)
CO2: 30 mmol/L (ref 22–32)
Creatinine, Ser: 0.99 mg/dL (ref 0.44–1.00)
GFR calc non Af Amer: >60 mL/min (ref >60)
GLUCOSE: 140 mg/dL — AB (ref 65–99)
Potassium: 4.2 mmol/L (ref 3.5–5.1)
Sodium: 143 mmol/L (ref 135–145)

## 2015-02-09 LAB — TSH: TSH: 1.507 u[IU]/mL (ref 0.350–4.500)

## 2015-02-09 LAB — GLUCOSE, CAPILLARY
GLUCOSE-CAPILLARY: 107 mg/dL — AB (ref 65–99)
GLUCOSE-CAPILLARY: 228 mg/dL — AB (ref 65–99)
GLUCOSE-CAPILLARY: 90 mg/dL (ref 65–99)
GLUCOSE-CAPILLARY: 140 mg/dL — AB (ref 65–99)

## 2015-02-09 MED ORDER — REGADENOSON 0.4 MG/5ML IV SOLN
0.4000 mg | Freq: Once | INTRAVENOUS | Status: AC
  Administered 2015-02-09: 0.4 mg via INTRAVENOUS
  Filled 2015-02-09: qty 5

## 2015-02-09 MED ORDER — REGADENOSON 0.4 MG/5ML IV SOLN
INTRAVENOUS | Status: AC
  Filled 2015-02-09: qty 5

## 2015-02-09 NOTE — Consult Note (Signed)
CONSULTATION NOTE  Reason for Consult: Chest pain  Requesting Physician: Dr. Dyann Kief  Cardiologist: None (NEW)  HPI: This is a 61 y.o. female with a past medical history significant for possible multiple sclerosis, recent optic neuritis, type 2 diabetes, restless leg syndrome, hypothyroidism, GERD, hyperlipidemia, and obstructive sleep apnea on CPAP who presented with substernal chest pain. Symptoms presented in the morning when she was getting ready for church. Her pain was described as a 7 out of 10 in was better with rest. She was then given a glycerin in the emergency department which totally resolved her pain. She reportedly stopped Adderall 1-2 weeks ago for issues with hypertension as a side effect. Troponin overnight was negative 3. EKG shows sinus rhythm with low voltage, no ischemic changes. She reports she developed some mild chest discomfort this morning which was partially relieved by GI cocktail.  PMHx:  Past Medical History  Diagnosis Date  . Multiple sclerosis ([place])   . Restless leg syndrome   . Thyroid disease     Hypothyroidism  . GERD (gastroesophageal reflux disease)   . Diabetes mellitus without complication ([place])   . OSA (obstructive sleep apnea)    Past Surgical History  Procedure Laterality Date  . Cesarean section    . Abdominal hysterectomy      FAMHx: Family History  Problem Relation Age of Onset  . Coronary artery disease Neg Hx   . Barrett's esophagus Mother   . Graves' disease Sister   . Rheum arthritis Sister     SOCHx:  reports that she quit smoking about 31 years ago. She does not have any smokeless tobacco history on file. She reports that she does not drink alcohol or use illicit drugs.  ALLERGIES: No Known Allergies  ROS: Pertinent items noted in HPI and remainder of comprehensive ROS otherwise negative.  HOME MEDICATIONS:   Medication List    ASK your doctor about these medications        amantadine 100 MG capsule    Commonly known as:  SYMMETREL  Take 1 capsule (100 mg total) by mouth 3 (three) times daily.     amphetamine-dextroamphetamine 30 MG 24 hr capsule  Commonly known as:  ADDERALL XR  Take 1 capsule (30 mg total) by mouth daily.     aspirin EC 81 MG EC tablet  Generic drug:  aspirin  Take 81 mg by mouth daily. Swallow whole.     CENTRUM ADULTS PO  Take 1 tablet by mouth daily. Chewable tablet     FLUOXETINE HCL PO  Take 30 mg by mouth daily.     freestyle lancets     FREESTYLE LITE test strip  Generic drug:  glucose blood     gabapentin 300 MG capsule  Commonly known as:  NEURONTIN  Take 2 capsules (600 mg total) by mouth at bedtime.     levothyroxine 175 MCG tablet  Commonly known as:  SYNTHROID, LEVOTHROID  daily.     omeprazole 20 MG capsule  Commonly known as:  PRILOSEC  Take 20 mg by mouth daily.     phentermine 37.5 MG capsule  Take 1 capsule (37.5 mg total) by mouth every morning.     rOPINIRole 3 MG tablet  Commonly known as:  REQUIP  TAKE 1 TABLET BY MOUTH DAILY AT DINNER AND 1 TABLET AT BEDTIME     simvastatin 10 MG tablet  Commonly known as:  ZOCOR  Take 10 mg by mouth daily.  SUMAtriptan 100 MG tablet  Commonly known as:  IMITREX  Take 1 tablet (100 mg total) by mouth once as needed for migraine. May repeat in 2 hours if headache persists or recurs.     tiZANidine 4 MG tablet  Commonly known as:  ZANAFLEX  Take 4 mg by mouth every 6 (six) hours as needed for muscle spasms.     zinc sulfate 220 MG capsule  Take 220 mg by mouth daily.        HOSPITAL MEDICATIONS: I have reviewed the patient's current medications.  VITALS: Blood pressure 122/54, pulse 85, temperature 98.9 F (37.2 C), temperature source Oral, resp. rate 20, height '5\' 6"'  (1.676 m), weight 274 lb 14.4 oz (124.694 kg), SpO2 96 %.  PHYSICAL EXAM: General appearance: alert, no distress and moderately obese Neck: no carotid bruit and no JVD Lungs: clear to auscultation  bilaterally Heart: regular rate and rhythm, S1, S2 normal and systolic murmur: early systolic 2/6, blowing at apex Abdomen: soft, non-tender; bowel sounds normal; no masses,  no organomegaly Extremities: extremities normal, atraumatic, no cyanosis or edema Pulses: 2+ and symmetric Skin: Skin color, texture, turgor normal. No rashes or lesions Neurologic: Grossly normal Psych: Pleasant  LABS: Results for orders placed or performed during the hospital encounter of 02/08/15 (from the past 48 hour(s))  Basic metabolic panel     Status: Abnormal   Collection Time: 02/08/15 10:26 AM  Result Value Ref Range   Sodium 141 135 - 145 mmol/L   Potassium 4.2 3.5 - 5.1 mmol/L   Chloride 103 101 - 111 mmol/L   CO2 27 22 - 32 mmol/L   Glucose, Bld 116 (H) 65 - 99 mg/dL   BUN 16 6 - 20 mg/dL   Creatinine, Ser 0.80 0.44 - 1.00 mg/dL   Calcium 9.2 8.9 - 10.3 mg/dL   GFR calc non Af Amer >60 >60 mL/min   GFR calc Af Amer >60 >60 mL/min    Comment: (NOTE) The eGFR has been calculated using the CKD EPI equation. This calculation has not been validated in all clinical situations. eGFR's persistently <60 mL/min signify possible Chronic Kidney Disease.    Anion gap 11 5 - 15  CBC     Status: None   Collection Time: 02/08/15 10:26 AM  Result Value Ref Range   WBC 7.6 4.0 - 10.5 K/uL   RBC 4.45 3.87 - 5.11 MIL/uL   Hemoglobin 12.2 12.0 - 15.0 g/dL   HCT 37.9 36.0 - 46.0 %   MCV 85.2 78.0 - 100.0 fL   MCH 27.4 26.0 - 34.0 pg   MCHC 32.2 30.0 - 36.0 g/dL   RDW 14.0 11.5 - 15.5 %   Platelets 239 150 - 400 K/uL  I-Stat Troponin, ED (not at [facility])     Status: None   Collection Time: 02/08/15 12:02 PM  Result Value Ref Range   Troponin i, poc 0.00 0.00 - 0.08 ng/mL   Comment 3            Comment: Due to the release kinetics of cTnI, a negative result within the first hours of the onset of symptoms does not rule out myocardial infarction with certainty. If myocardial infarction is still  suspected, repeat the test at appropriate intervals.   Troponin I-serum (0, 3, 6 hours)     Status: None   Collection Time: 02/08/15  4:13 PM  Result Value Ref Range   Troponin I <0.03 <0.031 ng/mL    Comment:  NO INDICATION OF MYOCARDIAL INJURY.   CBC     Status: Abnormal   Collection Time: 02/08/15  4:13 PM  Result Value Ref Range   WBC 6.5 4.0 - 10.5 K/uL   RBC 4.17 3.87 - 5.11 MIL/uL   Hemoglobin 11.2 (L) 12.0 - 15.0 g/dL   HCT 35.5 (L) 36.0 - 46.0 %   MCV 85.1 78.0 - 100.0 fL   MCH 26.9 26.0 - 34.0 pg   MCHC 31.5 30.0 - 36.0 g/dL   RDW 14.1 11.5 - 15.5 %   Platelets 235 150 - 400 K/uL  Creatinine, serum     Status: None   Collection Time: 02/08/15  4:13 PM  Result Value Ref Range   Creatinine, Ser 0.85 0.44 - 1.00 mg/dL   GFR calc non Af Amer >60 >60 mL/min   GFR calc Af Amer >60 >60 mL/min    Comment: (NOTE) The eGFR has been calculated using the CKD EPI equation. This calculation has not been validated in all clinical situations. eGFR's persistently <60 mL/min signify possible Chronic Kidney Disease.   Glucose, capillary     Status: None   Collection Time: 02/08/15  5:04 PM  Result Value Ref Range   Glucose-Capillary 96 65 - 99 mg/dL  Troponin I-serum (0, 3, 6 hours)     Status: None   Collection Time: 02/08/15  7:00 PM  Result Value Ref Range   Troponin I <0.03 <0.031 ng/mL    Comment:        NO INDICATION OF MYOCARDIAL INJURY.   Glucose, capillary     Status: Abnormal   Collection Time: 02/08/15  9:11 PM  Result Value Ref Range   Glucose-Capillary 108 (H) 65 - 99 mg/dL  Troponin I-serum (0, 3, 6 hours)     Status: None   Collection Time: 02/08/15 10:15 PM  Result Value Ref Range   Troponin I <0.03 <0.031 ng/mL    Comment:        NO INDICATION OF MYOCARDIAL INJURY.   TSH     Status: None   Collection Time: 02/09/15  2:25 AM  Result Value Ref Range   TSH 1.507 0.350 - 4.500 uIU/mL  Basic metabolic panel     Status: Abnormal   Collection  Time: 02/09/15  2:25 AM  Result Value Ref Range   Sodium 143 135 - 145 mmol/L   Potassium 4.2 3.5 - 5.1 mmol/L   Chloride 103 101 - 111 mmol/L   CO2 30 22 - 32 mmol/L   Glucose, Bld 140 (H) 65 - 99 mg/dL   BUN 15 6 - 20 mg/dL   Creatinine, Ser 0.99 0.44 - 1.00 mg/dL   Calcium 9.1 8.9 - 10.3 mg/dL   GFR calc non Af Amer >60 >60 mL/min   GFR calc Af Amer >60 >60 mL/min    Comment: (NOTE) The eGFR has been calculated using the CKD EPI equation. This calculation has not been validated in all clinical situations. eGFR's persistently <60 mL/min signify possible Chronic Kidney Disease.    Anion gap 10 5 - 15  Lipid panel     Status: Abnormal   Collection Time: 02/09/15  2:25 AM  Result Value Ref Range   Cholesterol 157 0 - 200 mg/dL   Triglycerides 256 (H) <150 mg/dL   HDL 34 (L) >40 mg/dL   Total CHOL/HDL Ratio 4.6 RATIO   VLDL 51 (H) 0 - 40 mg/dL   LDL Cholesterol 72 0 - 99 mg/dL  Comment:        Total Cholesterol/HDL:CHD Risk Coronary Heart Disease Risk Table                     Men   Women  1/2 Average Risk   3.4   3.3  Average Risk       5.0   4.4  2 X Average Risk   9.6   7.1  3 X Average Risk  23.4   11.0        Use the calculated Patient Ratio above and the CHD Risk Table to determine the patient's CHD Risk.        ATP III CLASSIFICATION (LDL):  <100     mg/dL   Optimal  100-129  mg/dL   Near or Above                    Optimal  130-159  mg/dL   Borderline  160-189  mg/dL   High  >190     mg/dL   Very High   Glucose, capillary     Status: Abnormal   Collection Time: 02/09/15  7:43 AM  Result Value Ref Range   Glucose-Capillary 107 (H) 65 - 99 mg/dL    IMAGING: Dg Chest 2 View  02/08/2015  CLINICAL DATA:  Acute onset chest pain radiating to back beginning this morning. Shortness of breath. EXAM: CHEST  2 VIEW COMPARISON:  05/12/2013 FINDINGS: The heart size and mediastinal contours are within normal limits. Both lungs are clear. No evidence of pneumothorax  or pleural effusion. The visualized skeletal structures are unremarkable. IMPRESSION: No active cardiopulmonary disease. Electronically Signed   By: Earle Gell M.D.   On: 02/08/2015 11:30    HOSPITAL DIAGNOSES: Principal Problem:   Substernal chest pain Active Problems:   possible MS   Hypothyroidism   GERD (gastroesophageal reflux disease)   OSA on CPAP   Restless leg syndrome   Migraine without aura and without status migrainosus, not intractable   IMPRESSION: 1. Chest pain with typical and atypical symptoms 2. Nonischemic EKG with low voltage 3. Type 2 diabetes-not on medication 4. OSA on CPAP  RECOMMENDATION: 1. Ms. Higby is describing chest pain with some typical and atypical features. It was completely relieved with nitroglycerin however is come back to a lower extent this morning. She was given a GI cocktail with some mild improvement. EKG is nonischemic however there is low voltage. This raises the question of possible her pericardial effusion or may be related to her body habitus. She reports having had a stress test with Dr. Wynonia Lawman more than 2 years ago, but there are no records in the chart. I recommend a Lexiscan nuclear stress test today -  she will likely need a 2 day study. I would also recommend an echocardiogram to rule out pericardial effusion.  Thanks for the consultation. Cardiology will follow with you.  Time Spent Directly with Patient: 30 minutes  Pixie Casino, MD, [facility] Attending Cardiologist [place] 02/09/2015, 8:50 AM

## 2015-02-09 NOTE — Care Management Obs Status (Signed)
[place] NOTIFICATION   Patient Details  Name: Anna Walker MRN: [id number] Date of Birth: [birth year]   Medicare Observation Status Notification Given:  Yes    Bethena Roys, RN 02/09/2015, 4:07 PM

## 2015-02-09 NOTE — Progress Notes (Addendum)
[facility] PROGRESS NOTE  Anna Walker [id number] DOB: September 14, 1955 DOA: 02/08/2015 PCP: Osborne Casco, MD  Assessment/Plan: Substernal chest pain -Heart score is 4 -Chest pain is somewhat typical, improved with rest, substernal and she has risk factors, admit to obs for rule out.  -Troponin X 3 neg -No acute ischemic changes on EKG or telemetry -given risk factor cardiology consulted; plan is for Cardiolite  -2-D echo pending   possible MS -On amantadine and Gabapentin for nerve pain -continue Zanaflex PRN   Hypothyroidism -TSH WNL -Continue synthroid   HLD -Continue simvastatin -LDL 72, Tg 256, HDL 34 -will add Omega 3 at discharge   GERD (gastroesophageal reflux disease) - Continue hPPI   OSA on CPAP -continue CPAP while in hospital   Restless leg syndrome - Continue ropinirole   Migraine without aura and without status migrainosus, not intractable - holding imitrex for now -no migraine   Obesity Body mass index is 44.39 kg/(m^2). -low calorie diet and exercise discussed with patient    Code Status: Full Family Communication: no family at bedside  Disposition Plan: follow results of Cardiolite (2 days test); troponin neg   Consultants:  Cardiology   Procedures:  2-D echo: pending  Myoview Cardiolite: pending   Antibiotics:  None   HPI/Subjective: Afebrile, no CP, no SOB and in no distress.   Objective: Filed Vitals:   02/09/15 1116 02/09/15 1553  BP:  143/80  Pulse: 101 87  Temp:  98.4 F (36.9 C)  Resp:  18    Intake/Output Summary (Last 24 hours) at 02/09/15 1828 Last data filed at 02/09/15 1740  Gross per 24 hour  Intake    240 ml  Output      0 ml  Net    240 ml   Filed Weights   02/08/15 0952 02/09/15 0443  Weight: 122.471 kg (270 lb) 124.694 kg (274 lb 14.4 oz)    Exam:   General:  Overweight/obese, in no acute distress, Afebrile, no CP, and denies SOB.  Cardiovascular: S1 and S2,  positive soft SEM, no rubs, no gallops  Respiratory: CTA bilaterally  Abdomen: obese, soft, NT, ND  Musculoskeletal: no cyanosis or clubbing    Data Reviewed: Basic Metabolic Panel:  Recent Labs Lab 02/08/15 1026 02/08/15 1613 02/09/15 0225  NA 141  --  143  K 4.2  --  4.2  CL 103  --  103  CO2 27  --  30  GLUCOSE 116*  --  140*  BUN 16  --  15  CREATININE 0.80 0.85 0.99  CALCIUM 9.2  --  9.1   CBC:  Recent Labs Lab 02/08/15 1026 02/08/15 1613  WBC 7.6 6.5  HGB 12.2 11.2*  HCT 37.9 35.5*  MCV 85.2 85.1  PLT 239 235   Cardiac Enzymes:  Recent Labs Lab 02/08/15 1613 02/08/15 1900 02/08/15 2215  TROPONINI <0.03 <0.03 <0.03   CBG:  Recent Labs Lab 02/08/15 1704 02/08/15 2111 02/09/15 0743 02/09/15 1309 02/09/15 1627  GLUCAP 96 108* 107* 228* 90    Studies: Dg Chest 2 View  02/08/2015  CLINICAL DATA:  Acute onset chest pain radiating to back beginning this morning. Shortness of breath. EXAM: CHEST  2 VIEW COMPARISON:  05/12/2013 FINDINGS: The heart size and mediastinal contours are within normal limits. Both lungs are clear. No evidence of pneumothorax or pleural effusion. The visualized skeletal structures are unremarkable. IMPRESSION: No active cardiopulmonary disease. Electronically Signed   By: Earle Gell M.D.   On:  02/08/2015 11:30    Scheduled Meds: . amantadine  100 mg Oral TID  . aspirin EC  81 mg Oral Daily  . enoxaparin (LOVENOX) injection  40 mg Subcutaneous Q24H  . FLUoxetine  30 mg Oral Daily  . gabapentin  600 mg Oral QHS  . insulin aspart  0-9 Units Subcutaneous TID WC  . levothyroxine  175 mcg Oral QAC breakfast  . multivitamin with minerals  1 tablet Oral Daily  . pantoprazole  40 mg Oral Daily  . regadenoson      . rOPINIRole  3 mg Oral BID  . simvastatin  10 mg Oral Daily   Continuous Infusions:   Principal Problem:   Substernal chest pain Active Problems:   possible MS   Hypothyroidism   GERD (gastroesophageal reflux  disease)   OSA on CPAP   Restless leg syndrome   Migraine without aura and without status migrainosus, not intractable    Time spent: 35 minutes    Barton Dubois  [facility] Pager [phone]. If 7PM-7AM, please contact night-coverage at [email or web address], password [email or web address] 02/09/2015, 6:28 PM

## 2015-02-09 NOTE — Progress Notes (Signed)
   Willette Alma presented for a lexiscan cardiolite today.  No immediate complications.  This is DAY 1 OF 2 for this study.  Stress imaging pending (final result will not be available until 1/17).  Murray Hodgkins, NP 02/09/2015, 11:10 AM

## 2015-02-09 NOTE — Progress Notes (Signed)
RT Note:  Patient has home CPAP UNIT with her and doesn't need any assistance with application. RT will continue to monitor.

## 2015-02-10 ENCOUNTER — Observation Stay (HOSPITAL_BASED_OUTPATIENT_CLINIC_OR_DEPARTMENT_OTHER): Payer: PPO

## 2015-02-10 DIAGNOSIS — K219 Gastro-esophageal reflux disease without esophagitis: Secondary | ICD-10-CM | POA: Diagnosis not present

## 2015-02-10 DIAGNOSIS — R072 Precordial pain: Secondary | ICD-10-CM | POA: Diagnosis not present

## 2015-02-10 DIAGNOSIS — E039 Hypothyroidism, unspecified: Secondary | ICD-10-CM | POA: Diagnosis not present

## 2015-02-10 DIAGNOSIS — G43009 Migraine without aura, not intractable, without status migrainosus: Secondary | ICD-10-CM | POA: Diagnosis not present

## 2015-02-10 DIAGNOSIS — R0609 Other forms of dyspnea: Secondary | ICD-10-CM

## 2015-02-10 DIAGNOSIS — G4733 Obstructive sleep apnea (adult) (pediatric): Secondary | ICD-10-CM | POA: Diagnosis not present

## 2015-02-10 DIAGNOSIS — R079 Chest pain, unspecified: Secondary | ICD-10-CM

## 2015-02-10 DIAGNOSIS — G35 Multiple sclerosis: Secondary | ICD-10-CM | POA: Diagnosis not present

## 2015-02-10 LAB — NM MYOCAR MULTI W/SPECT W/WALL MOTION / EF
CHL CUP MPHR: 163 {beats}/min
CSEPHR: 64 %
Estimated workload: 1 METS
Exercise duration (min): 0 min
Exercise duration (sec): 0 s
Peak HR: 105 {beats}/min
Rest HR: 81 {beats}/min

## 2015-02-10 LAB — HEMOGLOBIN A1C
HEMOGLOBIN A1C: 5.9 % — AB (ref 4.8–5.6)
MEAN PLASMA GLUCOSE: 123 mg/dL

## 2015-02-10 LAB — GLUCOSE, CAPILLARY
GLUCOSE-CAPILLARY: 108 mg/dL — AB (ref 65–99)
Glucose-Capillary: 122 mg/dL — ABNORMAL HIGH (ref 65–99)
Glucose-Capillary: 90 mg/dL (ref 65–99)

## 2015-02-10 MED ORDER — TECHNETIUM TC 99M SESTAMIBI GENERIC - CARDIOLITE
10.0000 | Freq: Once | INTRAVENOUS | Status: AC | PRN
  Administered 2015-02-10: 10 via INTRAVENOUS

## 2015-02-10 MED ORDER — TECHNETIUM TC 99M SESTAMIBI - CARDIOLITE
30.0000 | Freq: Once | INTRAVENOUS | Status: AC | PRN
  Administered 2015-02-10: 30 via INTRAVENOUS

## 2015-02-10 MED ORDER — OMEPRAZOLE 40 MG PO CPDR: 40.0000 mg | DELAYED_RELEASE_CAPSULE | Freq: Two times a day (BID) | ORAL | Status: DC

## 2015-02-10 NOTE — Progress Notes (Signed)
DAILY PROGRESS NOTE  Subjective:  No events overnight. Troponin negative x 4. Echo ordered, but not yet performed - was cancelled for some reason. She had some blurred vision overnight, stuffy nose today and some cough, migraine headache due to accidentally eating artificial sweetner? Also, had incontinence last night - says she was on Detrol, but came off of it months ago.  Objective:  Temp:  [98.3 F (36.8 C)-99.2 F (37.3 C)] 99.2 F (37.3 C) (01/17 0500) Pulse Rate:  [83-105] 87 (01/17 0500) Resp:  [18] 18 (01/16 1553) BP: (104-143)/(60-80) 136/70 mmHg (01/17 0500) SpO2:  [93 %-98 %] 95 % (01/17 0500) Weight:  [272 lb 6.4 oz (123.56 kg)] 272 lb 6.4 oz (123.56 kg) (01/17 0500) Weight change: 2 lb 6.4 oz (1.089 kg)  Intake/Output from previous day:    Intake/Output from this shift:    Medications: Current Facility-Administered Medications  Medication Dose Route Frequency Provider Last Rate Last Dose  . acetaminophen (TYLENOL) tablet 650 mg  650 mg Oral Q4H PRN Sid Falcon, MD   650 mg at 02/10/15 0908  . amantadine (SYMMETREL) capsule 100 mg  100 mg Oral TID Sid Falcon, MD   100 mg at 02/10/15 0908  . aspirin EC tablet 81 mg  81 mg Oral Daily Sid Falcon, MD   81 mg at 02/10/15 0909  . enoxaparin (LOVENOX) injection 40 mg  40 mg Subcutaneous Q24H Sid Falcon, MD   40 mg at 02/09/15 1745  . FLUoxetine (PROZAC) capsule 30 mg  30 mg Oral Daily Sid Falcon, MD   30 mg at 02/10/15 0908  . gabapentin (NEURONTIN) capsule 600 mg  600 mg Oral QHS Sid Falcon, MD   600 mg at 02/09/15 2132  . gi cocktail (Maalox,Lidocaine,Donnatal)  30 mL Oral QID PRN Sid Falcon, MD   30 mL at 02/09/15 0543  . insulin aspart (novoLOG) injection 0-9 Units  0-9 Units Subcutaneous TID WC Sid Falcon, MD   1 Units at 02/10/15 0859  . levothyroxine (SYNTHROID, LEVOTHROID) tablet 175 mcg  175 mcg Oral QAC breakfast Sid Falcon, MD   175 mcg at 02/10/15 0908  . morphine 2 MG/ML  injection 2 mg  2 mg Intravenous Q2H PRN Sid Falcon, MD      . multivitamin with minerals tablet 1 tablet  1 tablet Oral Daily Sid Falcon, MD   1 tablet at 02/10/15 0908  . ondansetron (ZOFRAN) injection 4 mg  4 mg Intravenous Q6H PRN Sid Falcon, MD      . pantoprazole (PROTONIX) EC tablet 40 mg  40 mg Oral Daily Sid Falcon, MD   40 mg at 02/10/15 0908  . rOPINIRole (REQUIP) tablet 3 mg  3 mg Oral BID Sid Falcon, MD   3 mg at 02/09/15 2132  . simvastatin (ZOCOR) tablet 10 mg  10 mg Oral Daily Sid Falcon, MD   10 mg at 02/10/15 0909  . tiZANidine (ZANAFLEX) tablet 4 mg  4 mg Oral Q6H PRN Sid Falcon, MD   4 mg at 02/08/15 1638    Physical Exam: General appearance: alert, no distress and morbidly obese Lungs: clear to auscultation bilaterally Heart: regular rate and rhythm, S1, S2 normal, no murmur, click, rub or gallop Extremities: extremities normal, atraumatic, no cyanosis or edema  Lab Results: Results for orders placed or performed during the hospital encounter of 02/08/15 (from the past 48 hour(s))  Basic metabolic  panel     Status: Abnormal   Collection Time: 02/08/15 10:26 AM  Result Value Ref Range   Sodium 141 135 - 145 mmol/L   Potassium 4.2 3.5 - 5.1 mmol/L   Chloride 103 101 - 111 mmol/L   CO2 27 22 - 32 mmol/L   Glucose, Bld 116 (H) 65 - 99 mg/dL   BUN 16 6 - 20 mg/dL   Creatinine, Ser 0.80 0.44 - 1.00 mg/dL   Calcium 9.2 8.9 - 10.3 mg/dL   GFR calc non Af Amer >60 >60 mL/min   GFR calc Af Amer >60 >60 mL/min    Comment: (NOTE) The eGFR has been calculated using the CKD EPI equation. This calculation has not been validated in all clinical situations. eGFR's persistently <60 mL/min signify possible Chronic Kidney Disease.    Anion gap 11 5 - 15  CBC     Status: None   Collection Time: 02/08/15 10:26 AM  Result Value Ref Range   WBC 7.6 4.0 - 10.5 K/uL   RBC 4.45 3.87 - 5.11 MIL/uL   Hemoglobin 12.2 12.0 - 15.0 g/dL   HCT 37.9 36.0 -  46.0 %   MCV 85.2 78.0 - 100.0 fL   MCH 27.4 26.0 - 34.0 pg   MCHC 32.2 30.0 - 36.0 g/dL   RDW 14.0 11.5 - 15.5 %   Platelets 239 150 - 400 K/uL  I-Stat Troponin, ED (not at [facility])     Status: None   Collection Time: 02/08/15 12:02 PM  Result Value Ref Range   Troponin i, poc 0.00 0.00 - 0.08 ng/mL   Comment 3            Comment: Due to the release kinetics of cTnI, a negative result within the first hours of the onset of symptoms does not rule out myocardial infarction with certainty. If myocardial infarction is still suspected, repeat the test at appropriate intervals.   Troponin I-serum (0, 3, 6 hours)     Status: None   Collection Time: 02/08/15  4:13 PM  Result Value Ref Range   Troponin I <0.03 <0.031 ng/mL    Comment:        NO INDICATION OF MYOCARDIAL INJURY.   CBC     Status: Abnormal   Collection Time: 02/08/15  4:13 PM  Result Value Ref Range   WBC 6.5 4.0 - 10.5 K/uL   RBC 4.17 3.87 - 5.11 MIL/uL   Hemoglobin 11.2 (L) 12.0 - 15.0 g/dL   HCT 35.5 (L) 36.0 - 46.0 %   MCV 85.1 78.0 - 100.0 fL   MCH 26.9 26.0 - 34.0 pg   MCHC 31.5 30.0 - 36.0 g/dL   RDW 14.1 11.5 - 15.5 %   Platelets 235 150 - 400 K/uL  Creatinine, serum     Status: None   Collection Time: 02/08/15  4:13 PM  Result Value Ref Range   Creatinine, Ser 0.85 0.44 - 1.00 mg/dL   GFR calc non Af Amer >60 >60 mL/min   GFR calc Af Amer >60 >60 mL/min    Comment: (NOTE) The eGFR has been calculated using the CKD EPI equation. This calculation has not been validated in all clinical situations. eGFR's persistently <60 mL/min signify possible Chronic Kidney Disease.   Glucose, capillary     Status: None   Collection Time: 02/08/15  5:04 PM  Result Value Ref Range   Glucose-Capillary 96 65 - 99 mg/dL  Troponin I-serum (0, 3, 6 hours)  Status: None   Collection Time: 02/08/15  7:00 PM  Result Value Ref Range   Troponin I <0.03 <0.031 ng/mL    Comment:        NO INDICATION OF MYOCARDIAL INJURY.     Glucose, capillary     Status: Abnormal   Collection Time: 02/08/15  9:11 PM  Result Value Ref Range   Glucose-Capillary 108 (H) 65 - 99 mg/dL  Troponin I-serum (0, 3, 6 hours)     Status: None   Collection Time: 02/08/15 10:15 PM  Result Value Ref Range   Troponin I <0.03 <0.031 ng/mL    Comment:        NO INDICATION OF MYOCARDIAL INJURY.   Hemoglobin A1c     Status: Abnormal   Collection Time: 02/09/15  2:25 AM  Result Value Ref Range   Hgb A1c MFr Bld 5.9 (H) 4.8 - 5.6 %    Comment: (NOTE)         Pre-diabetes: 5.7 - 6.4         Diabetes: >6.4         Glycemic control for adults with diabetes: <7.0    Mean Plasma Glucose 123 mg/dL    Comment: (NOTE) Performed At: [facility] [place], [place] [id number] Lindon Romp MD [id number]   TSH     Status: None   Collection Time: 02/09/15  2:25 AM  Result Value Ref Range   TSH 1.507 0.350 - 4.500 uIU/mL  Basic metabolic panel     Status: Abnormal   Collection Time: 02/09/15  2:25 AM  Result Value Ref Range   Sodium 143 135 - 145 mmol/L   Potassium 4.2 3.5 - 5.1 mmol/L   Chloride 103 101 - 111 mmol/L   CO2 30 22 - 32 mmol/L   Glucose, Bld 140 (H) 65 - 99 mg/dL   BUN 15 6 - 20 mg/dL   Creatinine, Ser 0.99 0.44 - 1.00 mg/dL   Calcium 9.1 8.9 - 10.3 mg/dL   GFR calc non Af Amer >60 >60 mL/min   GFR calc Af Amer >60 >60 mL/min    Comment: (NOTE) The eGFR has been calculated using the CKD EPI equation. This calculation has not been validated in all clinical situations. eGFR's persistently <60 mL/min signify possible Chronic Kidney Disease.    Anion gap 10 5 - 15  Lipid panel     Status: Abnormal   Collection Time: 02/09/15  2:25 AM  Result Value Ref Range   Cholesterol 157 0 - 200 mg/dL   Triglycerides 256 (H) <150 mg/dL   HDL 34 (L) >40 mg/dL   Total CHOL/HDL Ratio 4.6 RATIO   VLDL 51 (H) 0 - 40 mg/dL   LDL Cholesterol 72 0 - 99 mg/dL    Comment:        Total Cholesterol/HDL:CHD  Risk Coronary Heart Disease Risk Table                     Men   Women  1/2 Average Risk   3.4   3.3  Average Risk       5.0   4.4  2 X Average Risk   9.6   7.1  3 X Average Risk  23.4   11.0        Use the calculated Patient Ratio above and the CHD Risk Table to determine the patient's CHD Risk.        ATP III CLASSIFICATION (LDL):  <  100     mg/dL   Optimal  100-129  mg/dL   Near or Above                    Optimal  130-159  mg/dL   Borderline  160-189  mg/dL   High  >190     mg/dL   Very High   Glucose, capillary     Status: Abnormal   Collection Time: 02/09/15  7:43 AM  Result Value Ref Range   Glucose-Capillary 107 (H) 65 - 99 mg/dL  Glucose, capillary     Status: Abnormal   Collection Time: 02/09/15  1:09 PM  Result Value Ref Range   Glucose-Capillary 228 (H) 65 - 99 mg/dL  Glucose, capillary     Status: None   Collection Time: 02/09/15  4:27 PM  Result Value Ref Range   Glucose-Capillary 90 65 - 99 mg/dL  Glucose, capillary     Status: Abnormal   Collection Time: 02/09/15  8:45 PM  Result Value Ref Range   Glucose-Capillary 140 (H) 65 - 99 mg/dL  Glucose, capillary     Status: Abnormal   Collection Time: 02/10/15  7:46 AM  Result Value Ref Range   Glucose-Capillary 122 (H) 65 - 99 mg/dL    Imaging: Dg Chest 2 View  02/08/2015  CLINICAL DATA:  Acute onset chest pain radiating to back beginning this morning. Shortness of breath. EXAM: CHEST  2 VIEW COMPARISON:  05/12/2013 FINDINGS: The heart size and mediastinal contours are within normal limits. Both lungs are clear. No evidence of pneumothorax or pleural effusion. The visualized skeletal structures are unremarkable. IMPRESSION: No active cardiopulmonary disease. Electronically Signed   By: Earle Gell M.D.   On: 02/08/2015 11:30    Assessment:  1. Principal Problem: 2.   Substernal chest pain 3. Active Problems: 4.   possible MS 5.   Hypothyroidism 6.   GERD (gastroesophageal reflux disease) 7.   OSA on  CPAP 8.   Restless leg syndrome 9.   Migraine without aura and without status migrainosus, not intractable 10.   Plan:  1. Constellation of symptoms sound like this may be dysautonomia related to an MS flare. Troponins have been negative. Check echo to r/o cardiomyopathy due to dyspnea and pericardial effusion due to low voltage (although this may just be due to obesity). If echo is benign and stress test is non-ischemic, could be safely discharged this afternoon. Will need early follow-up with her neurologist for possible MS flair.   Time Spent Directly with Patient:  15 minutes  Length of Stay:    Pixie Casino, MD, [facility] Attending Cardiologist [place] 02/10/2015, 9:21 AM

## 2015-02-10 NOTE — Progress Notes (Signed)
Echocardiogram 2D Echocardiogram has been performed.  Anna Walker 02/10/2015, 11:17 AM

## 2015-02-10 NOTE — Discharge Summary (Signed)
Physician Discharge Summary  Anna Walker [id number] DOB: 1955/10/30 DOA: 02/08/2015  PCP: Osborne Casco, MD  Admit date: 02/08/2015 Discharge date: 02/10/2015  Time spent: 35 minutes  Recommendations for Outpatient Follow-up:  1. Repeat BMET to follow electrolytes and renal function 2. Reassess reflux symptoms and if needed adjust/change PPI further  Discharge Diagnoses:  Principal Problem:   Substernal chest pain Active Problems:   possible MS   Hypothyroidism   GERD (gastroesophageal reflux disease)   OSA on CPAP   Restless leg syndrome   Migraine without aura and without status migrainosus, not intractable   Discharge Condition: stable and improved. Will discharge home with outpatient follow up by PCP and neurology service.  Diet recommendation: low sodium and low calorie diet  Filed Weights   02/08/15 0952 02/09/15 0443 02/10/15 0500  Weight: 122.471 kg (270 lb) 124.694 kg (274 lb 14.4 oz) 123.56 kg (272 lb 6.4 oz)    History of present illness:  60yo woman with PMH of optic neuritis/MS, DM2 (on no meds due to hypoglycemia), RLS, hypothyroidism, GERD, HLD, OSA on CPAP who presents for substernal chest pain. She reports the pain started this morning while in bed, but did not awake her from sleep. She felt the pain was like pressure which was pushing on the middle of her chest and from the back. At its worst it was 7/10. It was worse with getting ready to go to church this morning and better with rest. Rest brought it down to 4/10 and nitro and aspirin given in the ED brought it down to 0/10. She had associated SOB. She denies nausea or diaphoresis. The pain was continuous until medications were given. She has had chest pain like this before once a few years ago which was worked up and she reports only being found to have an "enlarged heart." She is a former smoker and she quit in 1986. She has no FH of CAD. She notes that her father likely died of an  blood clock after an accident with a horse. She is listed as taking adderall, but she notes that it raised her BP too much and so she stopped about 1-2 weeks ago.  Hospital Course:  Substernal chest pain -Heart score is 4 -Troponin X 3 neg -No acute ischemic changes on EKG or telemetry -given risk factor cardiology consulted; Cardiolite done with low risk probability -2-D echo with no wall motion abnormalities and preserved EF  MS -On amantadine and Gabapentin for nerve pain -continue Zanaflex PRN -outpatient follow with neurology service    Hypothyroidism -TSH WNL -Continue synthroid   HLD -Continue simvastatin -LDL 72, Tg 256, HDL 34 -advise to use Omega 3 over the counter to help with TG/HDL  GERD (gastroesophageal reflux disease) - Continue PPI, but dose adjusted to 40mg  BID for better control of her symptoms    OSA on CPAP -continue CPAP at bedtime    Restless leg syndrome - Continue ropinirole   Migraine without aura and without status migrainosus, not intractable -no migraines during admission -continue PRN imitrex   Obesity Body mass index is 44.39 kg/(m^2). -low calorie diet and exercise discussed with patient    Procedures:  2-D echo:  - Left ventricle: The cavity size was normal. Wall thickness was normal. Systolic function was normal. The estimated ejection fraction was in the range of 60% to 65%. Wall motion was normal; there were no regional wall motion abnormalities. - Left atrium: The atrium was moderately dilated. - Atrial septum: No defect  or patent foramen ovale was identified.   Myoview Cardiolite:  1. No reversible ischemia or infarction.  2. Normal left ventricular wall motion except for slight dyskinetic bulging of the inferoseptal region. This is likely artifactual. Echocardiography may be helpful.  3. Left ventricular ejection fraction 67%  4. Low-risk stress test findings  Consultations:  Cardiology    Discharge Exam: Filed Vitals:   02/10/15 0500 02/10/15 1345  BP: 136/70 132/82  Pulse: 87 93  Temp: 99.2 F (37.3 C) 99.6 F (37.6 C)  Resp:  18    General: Overweight/obese, in no acute distress, Afebrile, no CP, and denying SOB at discharge.  Cardiovascular: S1 and S2, positive soft SEM, no rubs, no gallops  Respiratory: CTA bilaterally  Abdomen: obese, soft, NT, ND  Musculoskeletal: no cyanosis or clubbing   Discharge Instructions   Discharge Instructions    Discharge instructions    Complete by:  As directed   Arrange follow up with PCP in 2 weeks Arrange follow up with your neurology in 1-2 weeks for follow up on your MS Follow a low calorie and heart healthy diet Take medications as prescribed          Current Discharge Medication List    CONTINUE these medications which have CHANGED   Details  omeprazole (PRILOSEC) 40 MG capsule Take 1 capsule (40 mg total) by mouth 2 (two) times daily before a meal. Qty: 60 capsule, Refills: 1      CONTINUE these medications which have NOT CHANGED   Details  amantadine (SYMMETREL) 100 MG capsule Take 1 capsule (100 mg total) by mouth 3 (three) times daily. Qty: 90 capsule, Refills: 1    aspirin (ASPIRIN EC) 81 MG EC tablet Take 81 mg by mouth daily. Swallow whole.    FLUOXETINE HCL PO Take 30 mg by mouth daily.    FREESTYLE LITE test strip Refills: 4    gabapentin (NEURONTIN) 300 MG capsule Take 2 capsules (600 mg total) by mouth at bedtime. Qty: 60 capsule, Refills: 6    Lancets (FREESTYLE) lancets Refills: 5    levothyroxine (SYNTHROID, LEVOTHROID) 175 MCG tablet daily. Refills: 3    Multiple Vitamins-Minerals (CENTRUM ADULTS PO) Take 1 tablet by mouth daily. Chewable tablet    rOPINIRole (REQUIP) 3 MG tablet TAKE 1 TABLET BY MOUTH DAILY AT DINNER AND 1 TABLET AT BEDTIME Qty: 60 tablet, Refills: 6    simvastatin (ZOCOR) 10 MG tablet Take 10 mg by mouth daily.    SUMAtriptan (IMITREX) 100 MG tablet  Take 1 tablet (100 mg total) by mouth once as needed for migraine. May repeat in 2 hours if headache persists or recurs. Qty: 10 tablet, Refills: 2    tiZANidine (ZANAFLEX) 4 MG tablet Take 4 mg by mouth every 6 (six) hours as needed for muscle spasms.    zinc sulfate 220 MG capsule Take 220 mg by mouth daily.    amphetamine-dextroamphetamine (ADDERALL XR) 30 MG 24 hr capsule Take 1 capsule (30 mg total) by mouth daily. Qty: 30 capsule, Refills: 0    phentermine 37.5 MG capsule Take 1 capsule (37.5 mg total) by mouth every morning. Qty: 30 capsule, Refills: 3       No Known Allergies Follow-up Information    Follow up with Osborne Casco, MD. Schedule an appointment as soon as possible for a visit in 2 weeks.   Specialty:  Family Medicine   Contact information:   [address] [place] [address] [phone]  The results of significant diagnostics from this hospitalization (including imaging, microbiology, ancillary and laboratory) are listed below for reference.    Significant Diagnostic Studies: Dg Chest 2 View  02/08/2015  CLINICAL DATA:  Acute onset chest pain radiating to back beginning this morning. Shortness of breath. EXAM: CHEST  2 VIEW COMPARISON:  05/12/2013 FINDINGS: The heart size and mediastinal contours are within normal limits. Both lungs are clear. No evidence of pneumothorax or pleural effusion. The visualized skeletal structures are unremarkable. IMPRESSION: No active cardiopulmonary disease. Electronically Signed   By: Earle Gell M.D.   On: 02/08/2015 11:30   Nm Myocar Multi W/spect W/wall Motion / Ef  02/10/2015  CLINICAL DATA:  Chest pain.  Diabetes. EXAM: MYOCARDIAL IMAGING WITH SPECT (REST AND PHARMACOLOGIC-STRESS) GATED LEFT VENTRICULAR WALL MOTION STUDY LEFT VENTRICULAR EJECTION FRACTION TECHNIQUE: Standard myocardial SPECT imaging was performed after resting intravenous injection of 10 mCi Tc-28m sestamibi. Subsequently,  intravenous infusion of Lexiscan was performed under the supervision of the Cardiology staff. At peak effect of the drug, 30 mCi Tc-43m sestamibi was injected intravenously and standard myocardial SPECT imaging was performed. Quantitative gated imaging was also performed to evaluate left ventricular wall motion, and estimate left ventricular ejection fraction. COMPARISON:  None. FINDINGS: Perfusion: No decreased activity in the left ventricle on stress imaging to suggest reversible ischemia or infarction. Wall Motion: Normal wall motion and thickening with contraction except for mild dyskinetic bulging of the inferoseptal region. This is likely artifactual. Echocardiography may be helpful for further evaluation. Left Ventricular Ejection Fraction: 67 % End diastolic volume 74 ml End systolic volume 24 ml IMPRESSION: 1. No reversible ischemia or infarction. 2. Normal left ventricular wall motion except for slight dyskinetic bulging of the inferoseptal region. This is likely artifactual. Echocardiography may be helpful. 3. Left ventricular ejection fraction 67% 4. Low-risk stress test findings*. *2012 Appropriate Use Criteria for Coronary Revascularization Focused Update: J Am Coll Cardiol. [id number]. http://[email or web address] Electronically Signed   By: Marijo Sanes M.D.   On: 02/10/2015 14:35   Labs: Basic Metabolic Panel:  Recent Labs Lab 02/08/15 1026 02/08/15 1613 02/09/15 0225  NA 141  --  143  K 4.2  --  4.2  CL 103  --  103  CO2 27  --  30  GLUCOSE 116*  --  140*  BUN 16  --  15  CREATININE 0.80 0.85 0.99  CALCIUM 9.2  --  9.1   CBC:  Recent Labs Lab 02/08/15 1026 02/08/15 1613  WBC 7.6 6.5  HGB 12.2 11.2*  HCT 37.9 35.5*  MCV 85.2 85.1  PLT 239 235   Cardiac Enzymes:  Recent Labs Lab 02/08/15 1613 02/08/15 1900 02/08/15 2215  TROPONINI <0.03 <0.03 <0.03   CBG:  Recent Labs Lab 02/09/15 1309 02/09/15 1627 02/09/15 2045  02/10/15 0746 02/10/15 1245  GLUCAP 228* 90 140* 122* 90    Signed:  Barton Dubois MD.  [facility] 02/10/2015, 4:00 PM

## 2015-02-10 NOTE — Progress Notes (Signed)
Myoview was low risk with EF of 67%. Echo showed LV EF of 60-65%, no wm abnormality. Ok to discharge on cardiac stand point. See progress note for further recommendation.   Anna Walker, [place]

## 2015-02-11 ENCOUNTER — Ambulatory Visit: Payer: Self-pay | Admitting: Neurology

## 2015-02-13 ENCOUNTER — Other Ambulatory Visit: Payer: Self-pay | Admitting: Family Medicine

## 2015-02-13 ENCOUNTER — Ambulatory Visit
Admission: RE | Admit: 2015-02-13 | Discharge: 2015-02-13 | Disposition: A | Discharge status: Home / Self Care | Payer: PPO | Source: Ambulatory Visit | Attending: Family Medicine | Admitting: Family Medicine

## 2015-02-13 DIAGNOSIS — R509 Fever, unspecified: Secondary | ICD-10-CM

## 2015-02-13 DIAGNOSIS — R0602 Shortness of breath: Secondary | ICD-10-CM | POA: Diagnosis not present

## 2015-02-13 DIAGNOSIS — R05 Cough: Secondary | ICD-10-CM

## 2015-02-13 DIAGNOSIS — J189 Pneumonia, unspecified organism: Secondary | ICD-10-CM | POA: Diagnosis not present

## 2015-02-13 DIAGNOSIS — R059 Cough, unspecified: Secondary | ICD-10-CM

## 2015-02-23 ENCOUNTER — Encounter: Payer: Self-pay | Admitting: *Deleted

## 2015-03-03 DIAGNOSIS — E119 Type 2 diabetes mellitus without complications: Secondary | ICD-10-CM | POA: Diagnosis not present

## 2015-03-03 DIAGNOSIS — G35 Multiple sclerosis: Secondary | ICD-10-CM | POA: Diagnosis not present

## 2015-03-03 DIAGNOSIS — N179 Acute kidney failure, unspecified: Secondary | ICD-10-CM | POA: Diagnosis not present

## 2015-03-03 DIAGNOSIS — J189 Pneumonia, unspecified organism: Secondary | ICD-10-CM | POA: Diagnosis not present

## 2015-04-06 ENCOUNTER — Other Ambulatory Visit: Payer: Self-pay | Admitting: Neurology

## 2015-04-06 NOTE — Telephone Encounter (Signed)
Gabapentin r/f per faxed request/fim

## 2015-05-05 ENCOUNTER — Other Ambulatory Visit: Payer: Self-pay | Admitting: Neurology

## 2015-06-01 DIAGNOSIS — G4733 Obstructive sleep apnea (adult) (pediatric): Secondary | ICD-10-CM | POA: Diagnosis not present

## 2015-06-01 DIAGNOSIS — I10 Essential (primary) hypertension: Secondary | ICD-10-CM | POA: Diagnosis not present

## 2015-06-01 DIAGNOSIS — E119 Type 2 diabetes mellitus without complications: Secondary | ICD-10-CM | POA: Diagnosis not present

## 2015-06-01 DIAGNOSIS — R32 Unspecified urinary incontinence: Secondary | ICD-10-CM | POA: Diagnosis not present

## 2015-06-01 DIAGNOSIS — J309 Allergic rhinitis, unspecified: Secondary | ICD-10-CM | POA: Diagnosis not present

## 2015-06-01 DIAGNOSIS — E785 Hyperlipidemia, unspecified: Secondary | ICD-10-CM | POA: Diagnosis not present

## 2015-06-01 DIAGNOSIS — E039 Hypothyroidism, unspecified: Secondary | ICD-10-CM | POA: Diagnosis not present

## 2015-06-01 DIAGNOSIS — F39 Unspecified mood [affective] disorder: Secondary | ICD-10-CM | POA: Diagnosis not present

## 2015-06-01 DIAGNOSIS — G2581 Restless legs syndrome: Secondary | ICD-10-CM | POA: Diagnosis not present

## 2015-06-01 DIAGNOSIS — G35 Multiple sclerosis: Secondary | ICD-10-CM | POA: Diagnosis not present

## 2015-06-01 DIAGNOSIS — Z0000 Encounter for general adult medical examination without abnormal findings: Secondary | ICD-10-CM | POA: Diagnosis not present

## 2015-06-01 DIAGNOSIS — K219 Gastro-esophageal reflux disease without esophagitis: Secondary | ICD-10-CM | POA: Diagnosis not present

## 2015-06-24 ENCOUNTER — Encounter: Payer: Self-pay | Admitting: Neurology

## 2015-06-24 ENCOUNTER — Ambulatory Visit (INDEPENDENT_AMBULATORY_CARE_PROVIDER_SITE_OTHER): Payer: PPO | Admitting: Neurology

## 2015-06-24 VITALS — BP 120/76 | HR 74 | Resp 18 | Ht 66.0 in | Wt 283.0 lb

## 2015-06-24 DIAGNOSIS — F909 Attention-deficit hyperactivity disorder, unspecified type: Secondary | ICD-10-CM

## 2015-06-24 DIAGNOSIS — G4733 Obstructive sleep apnea (adult) (pediatric): Secondary | ICD-10-CM | POA: Diagnosis not present

## 2015-06-24 DIAGNOSIS — G2581 Restless legs syndrome: Secondary | ICD-10-CM | POA: Diagnosis not present

## 2015-06-24 DIAGNOSIS — G35 Multiple sclerosis: Secondary | ICD-10-CM | POA: Diagnosis not present

## 2015-06-24 DIAGNOSIS — Z9989 Dependence on other enabling machines and devices: Principal | ICD-10-CM

## 2015-06-24 DIAGNOSIS — G43009 Migraine without aura, not intractable, without status migrainosus: Secondary | ICD-10-CM | POA: Diagnosis not present

## 2015-06-24 DIAGNOSIS — R5383 Other fatigue: Secondary | ICD-10-CM | POA: Diagnosis not present

## 2015-06-24 DIAGNOSIS — F988 Other specified behavioral and emotional disorders with onset usually occurring in childhood and adolescence: Secondary | ICD-10-CM

## 2015-06-24 DIAGNOSIS — Z8669 Personal history of other diseases of the nervous system and sense organs: Secondary | ICD-10-CM | POA: Diagnosis not present

## 2015-06-24 MED ORDER — PHENTERMINE HCL 37.5 MG PO CAPS: 37.5000 mg | ORAL_CAPSULE | ORAL | Status: DC

## 2015-06-24 MED ORDER — GABAPENTIN 300 MG PO CAPS: ORAL_CAPSULE | ORAL | Status: DC

## 2015-06-24 NOTE — Progress Notes (Signed)
[facility]  PATIENT: Anna Walker DOB: [birth year]  REFERRING CLINICIAN:Elaine Laurann [place]  HISTORY FROM: Patient REASON FOR VISIT: H/O optic neuritis and possible MS   HISTORICAL  CHIEF COMPLAINT:  Chief Complaint  Patient presents with  . Multiple Sclerosis    Sts. she remains compliant with CPAP for OSA.  Sts. h/a's are infrequent and Imitrex does help when she has one.  Sts. fatigue is worse.  She was not able to tolerate Adderall (hypertension).  Phentermine didn't help as much.  She is taking Amantadine/fim  . Sleep Apnea  . Migraines    HISTORY OF PRESENT ILLNESS:  Anna Walker is a 60 year old woman with OSA, h/o right optic neuritis and migraines  OSA/Insomnia/RLS/PLMS:   She has obstructive sleep apnea and has been on CPAP since 2000. She reports being compliant with CPAP usage.   She was followed by Dr. Brett Fairy in 2009 and got a newer machine at that time. Weight is similar now as in 2009.  She rarely snores through her mask.  Her machine still works well.  She has not had a new mask x 1 year.  She has insomnia due to RLS and muscle cramps (thighs).   Her restless leg syndrome/periodic limb movements of sleep does better on gabapentin (300 in evening and 300 at bedtime) and ropinirole (3 mg) combination. Tizanidine helps some She notes RLS anytime she is still, esp in evening and night  She had noted only mild sleepiness and only rarely dozes off watching TV or other activities.    Migraine Headache:   She is doing better and has had only a couple headaches since 4 months ago.    Headaches are usually right frontal when they occur. One HA in 2016 was knocked out with Toradol and others with Imitrex.     Fatigue/ADD:   She has Fatigue and attention deficit.    Phentermine monotherapy helped more than amantadine but not as well as Adderall.     Adderall was not covered by insurance.   She has Neuropsych testing form 2002 while in [place].   She was  found to have ADD by Dr. Sheran Lawless and she recommended that she try stimulants.   She still notes some mental fog and decreased attention and STM with word finding issues.     ON/Concern about MS:   She had right optic neuritis in 1998. LP at that time showed an oligoclonal band.  MRI brain and C-spine did not show additional plaques.    A recent ophthalmological exam was consistent with a remote right optic neuritis.   She brought in other paperwork and repeat LP in 2002 was normal (no OCB and IgG ndex = 0.6)  I recommended that she stopped the Avonex last year and she denie any exacerbation.  A repeat MRI on 04/21/2014  that I personally reviewed was essentially normal.     She continues to note mild blurriness out of that eye and also has photosensitivity.   REVIEW OF SYSTEMS:  Constitutional: No fevers, chills, sweats, or change in appetite Eyes: see above.  She is  light sensitive with mild eye pain at times Ear, nose and throat: No hearing loss, ear pain, nasal congestion, sore throat Cardiovascular: No chest pain, palpitations Respiratory:  Occ episodes of shortness of breath.  No wheezes GastrointestinaI: No nausea, vomiting, diarrhea, abdominal pain, fecal incontinence.   Mild dysphagia at times.  Genitourinary:  No dysuria, urinary retention or frequency.  No nocturia. Musculoskeletal:  Notes neck pain, back pain with some stiffness.   Integumentary: No rash, pruritus, skin lesions Neurological: as above Psychiatric: No depression at this time.  No anxiety Endocrine: No palpitations,  change in appetite, change in weigh or increased thirst.  Some heat intolerance.   Hematologic/Lymphatic:  No anemia, purpura, petechiae.   She sometimes bruises easily.   Allergic/Immunologic: No itchy/runny eyes, nasal congestion, recent allergic reactions, rashes  ALLERGIES: No Known Allergies  HOME MEDICATIONS: Outpatient Prescriptions Prior to Visit  Medication Sig Dispense Refill  .  amantadine (SYMMETREL) 100 MG capsule TAKE 1 CAPSULE(100 MG) BY MOUTH THREE TIMES DAILY 90 capsule 11  . aspirin (ASPIRIN EC) 81 MG EC tablet Take 81 mg by mouth daily. Swallow whole.    Marland Kitchen FLUOXETINE HCL PO Take 30 mg by mouth daily.    Marland Kitchen FREESTYLE LITE test strip   4  . gabapentin (NEURONTIN) 300 MG capsule TAKE 2 CAPSULES(600 MG) BY MOUTH AT BEDTIME 60 capsule 5  . Lancets (FREESTYLE) lancets   5  . levothyroxine (SYNTHROID, LEVOTHROID) 175 MCG tablet daily.  3  . Multiple Vitamins-Minerals (CENTRUM ADULTS PO) Take 1 tablet by mouth daily. Chewable tablet    . omeprazole (PRILOSEC) 40 MG capsule Take 1 capsule (40 mg total) by mouth 2 (two) times daily before a meal. 60 capsule 1  . rOPINIRole (REQUIP) 3 MG tablet TAKE 1 TABLET BY MOUTH DAILY AT DINNER AND 1 TABLET AT BEDTIME 60 tablet 6  . simvastatin (ZOCOR) 10 MG tablet Take 10 mg by mouth daily.    . SUMAtriptan (IMITREX) 100 MG tablet Take 1 tablet (100 mg total) by mouth once as needed for migraine. May repeat in 2 hours if headache persists or recurs. 10 tablet 2  . tiZANidine (ZANAFLEX) 4 MG tablet Take 4 mg by mouth every 6 (six) hours as needed for muscle spasms.    Marland Kitchen zinc sulfate 220 MG capsule Take 220 mg by mouth daily.    . phentermine 37.5 MG capsule Take 1 capsule (37.5 mg total) by mouth every morning. (Patient not taking: Reported on 06/24/2015) 30 capsule 3  . amphetamine-dextroamphetamine (ADDERALL XR) 30 MG 24 hr capsule Take 1 capsule (30 mg total) by mouth daily. (Patient not taking: Reported on 06/24/2015) 30 capsule 0   No facility-administered medications prior to visit.    PAST MEDICAL HISTORY: Past Medical History  Diagnosis Date  . Multiple sclerosis ([place])   . Restless leg syndrome   . Thyroid disease     Hypothyroidism  . GERD (gastroesophageal reflux disease)   . Diabetes mellitus without complication ([place])   . OSA (obstructive sleep apnea)     PAST SURGICAL HISTORY: Past Surgical History  Procedure  Laterality Date  . Cesarean section    . Abdominal hysterectomy      FAMILY HISTORY: No FH of MS.  Her sister and aunt have RA.  Sister has hepatitis from RA treatment  SOCIAL HISTORY:  Social History   Social History  . Marital Status: Married    Spouse Name: N/A  . Number of Children: N/A  . Years of Education: N/A   Occupational History  . Not on file.   Social History Main Topics  . Smoking status: Former Smoker    Quit date: 01/25/1984  . Smokeless tobacco: Not on file  . Alcohol Use: No  . Drug Use: No  . Sexual Activity: Yes    Birth Control/ Protection: Surgical   Other Topics Concern  . Not  on file   Social History Narrative     PHYSICAL EXAM  Filed Vitals:   06/24/15 0946  BP: 120/76  Pulse: 74  Resp: 18  Height: 5\' 6"  (1.676 m)  Weight: 283 lb (128.368 kg)    Body mass index is 45.7 kg/(m^2).   General: The patient is well-developed and well-nourished and in no acute distress.   Pharynx is crowded (Mallampati 3)  Neurologic Exam  Mental status: The patient is alert and oriented x 3 at the time of the examination.  Speech is normal.  Cranial nerves: Extraocular movements are full.      Facial symmetry is present. She reports asymmetric facial sensation to soft touch and temp.Facial strength is normal.  Trapezius and sternocleidomastoid strength is normal. No dysarthria is noted.   No obvious hearing deficits are noted.  Motor:  Muscle bulk and tone are normal. Strength is  5 / 5 in all 4 extremities.   Sensory: She reports mildly decreased sensation to touch and vibration in the left arm compared to the right arm. She has more symmetric sensation to touch and vibration and temperature in the legs.   Coordination: Cerebellar testing reveals good finger-nose-finger.  Gait and station: Station is normal and gait is mildly wide. Tandem gait is wide.   Reflexes: Deep tendon reflexes are symmetric and normal bilaterally.      DIAGNOSTIC DATA  (LABS, IMAGING, TESTING) - I reviewed patient records, labs, notes, testing and imaging myself where available.  Lab Results  Component Value Date   WBC 6.5 02/08/2015   HGB 11.2* 02/08/2015   HCT 35.5* 02/08/2015   MCV 85.1 02/08/2015   PLT 235 02/08/2015      Component Value Date/Time   NA 143 02/09/2015 0225   K 4.2 02/09/2015 0225   CL 103 02/09/2015 0225   CO2 30 02/09/2015 0225   GLUCOSE 140* 02/09/2015 0225   BUN 15 02/09/2015 0225   CREATININE 0.99 02/09/2015 0225   CALCIUM 9.1 02/09/2015 0225   PROT 7.1 05/12/2013 1607   ALBUMIN 3.8 05/12/2013 1607   AST 18 05/12/2013 1607   ALT 20 05/12/2013 1607   ALKPHOS 95 05/12/2013 1607   BILITOT 0.4 05/12/2013 1607   GFRNONAA >60 02/09/2015 0225   GFRAA >60 02/09/2015 0225        ASSESSMENT AND PLAN  OSA on CPAP - Plan: For home use only DME continuous positive airway pressure (CPAP)  Migraine without aura and without status migrainosus, not intractable  possible MS  Attention deficit disorder  History of optic neuritis  Other fatigue  Restless leg syndrome     1.   Continue CPAP for OSA.   Script for new mask/supplies (wants [facility]) 2.    Resume phentermine 3.   Continue sumatriptan for migraine 4.   Increase gabapentin to 300 q evening and 600 mg po qHS.   Tizanidine as needed 5.   Try to remain active and exercise as tolerated.  Return to see me in 6 months or sooner if problems.      Richard A. Felecia Shelling, MD, PhD [id number], [id number] AM Certified in Neurology, Clinical Neurophysiology, Sleep Medicine, Pain Medicine and Neuroimaging  [facility] [address] [place], [place] [address] [phone] d1

## 2015-06-29 ENCOUNTER — Encounter: Payer: Self-pay | Admitting: Neurology

## 2015-07-02 ENCOUNTER — Telehealth: Payer: Self-pay | Admitting: Neurology

## 2015-07-02 MED ORDER — METHYLPREDNISOLONE 4 MG PO TBPK: ORAL_TABLET | ORAL | Status: DC

## 2015-07-02 NOTE — Telephone Encounter (Signed)
Medrol dose pk escribed to [facility].  I have spoken with Benjamine Mola and let her know this has been done/fim

## 2015-07-02 NOTE — Telephone Encounter (Signed)
Pt called in wants to know if rx for steroids has been sent in . Please call and advise [phone]

## 2015-07-29 DIAGNOSIS — M1711 Unilateral primary osteoarthritis, right knee: Secondary | ICD-10-CM | POA: Diagnosis not present

## 2015-08-11 DIAGNOSIS — M25561 Pain in right knee: Secondary | ICD-10-CM | POA: Diagnosis not present

## 2015-09-09 ENCOUNTER — Encounter (HOSPITAL_COMMUNITY): Payer: Self-pay | Admitting: Emergency Medicine

## 2015-09-09 DIAGNOSIS — R10813 Right lower quadrant abdominal tenderness: Secondary | ICD-10-CM | POA: Diagnosis not present

## 2015-09-09 DIAGNOSIS — R1033 Periumbilical pain: Secondary | ICD-10-CM | POA: Diagnosis not present

## 2015-09-09 DIAGNOSIS — K5900 Constipation, unspecified: Secondary | ICD-10-CM | POA: Diagnosis not present

## 2015-09-09 DIAGNOSIS — Z87891 Personal history of nicotine dependence: Secondary | ICD-10-CM | POA: Diagnosis not present

## 2015-09-09 DIAGNOSIS — E039 Hypothyroidism, unspecified: Secondary | ICD-10-CM | POA: Insufficient documentation

## 2015-09-09 DIAGNOSIS — K2970 Gastritis, unspecified, without bleeding: Secondary | ICD-10-CM | POA: Diagnosis not present

## 2015-09-09 DIAGNOSIS — E119 Type 2 diabetes mellitus without complications: Secondary | ICD-10-CM | POA: Insufficient documentation

## 2015-09-09 DIAGNOSIS — Z7982 Long term (current) use of aspirin: Secondary | ICD-10-CM | POA: Insufficient documentation

## 2015-09-09 DIAGNOSIS — F909 Attention-deficit hyperactivity disorder, unspecified type: Secondary | ICD-10-CM | POA: Diagnosis not present

## 2015-09-09 DIAGNOSIS — Z79899 Other long term (current) drug therapy: Secondary | ICD-10-CM | POA: Insufficient documentation

## 2015-09-09 DIAGNOSIS — K5730 Diverticulosis of large intestine without perforation or abscess without bleeding: Secondary | ICD-10-CM | POA: Diagnosis not present

## 2015-09-09 DIAGNOSIS — K5909 Other constipation: Secondary | ICD-10-CM | POA: Diagnosis not present

## 2015-09-09 LAB — COMPREHENSIVE METABOLIC PANEL
ALK PHOS: 87 U/L (ref 38–126)
ALT: 23 U/L (ref 14–54)
ANION GAP: 10 (ref 5–15)
AST: 23 U/L (ref 15–41)
Albumin: 4.8 g/dL (ref 3.5–5.0)
BUN: 17 mg/dL (ref 6–20)
CALCIUM: 9.2 mg/dL (ref 8.9–10.3)
CO2: 24 mmol/L (ref 22–32)
Chloride: 105 mmol/L (ref 101–111)
Creatinine, Ser: 1.2 mg/dL — ABNORMAL HIGH (ref 0.44–1.00)
GFR calc Af Amer: 57 mL/min — ABNORMAL LOW (ref >60)
GFR calc non Af Amer: 49 mL/min — ABNORMAL LOW (ref >60)
GLUCOSE: 129 mg/dL — AB (ref 65–99)
POTASSIUM: 3.3 mmol/L — AB (ref 3.5–5.1)
SODIUM: 139 mmol/L (ref 135–145)
Total Bilirubin: 0.7 mg/dL (ref 0.3–1.2)
Total Protein: 7.6 g/dL (ref 6.5–8.1)

## 2015-09-09 LAB — CBC
HEMATOCRIT: 37.8 % (ref 36.0–46.0)
HEMOGLOBIN: 12.7 g/dL (ref 12.0–15.0)
MCH: 27.7 pg (ref 26.0–34.0)
MCHC: 33.6 g/dL (ref 30.0–36.0)
MCV: 82.4 fL (ref 78.0–100.0)
Platelets: 292 10*3/uL (ref 150–400)
RBC: 4.59 MIL/uL (ref 3.87–5.11)
RDW: 14.4 % (ref 11.5–15.5)
WBC: 14 10*3/uL — ABNORMAL HIGH (ref 4.0–10.5)

## 2015-09-09 LAB — LIPASE, BLOOD: LIPASE: 22 U/L (ref 11–51)

## 2015-09-09 MED ORDER — FENTANYL CITRATE (PF) 100 MCG/2ML IJ SOLN
50.0000 ug | INTRAMUSCULAR | Status: DC | PRN
  Filled 2015-09-09: qty 2

## 2015-09-09 NOTE — ED Triage Notes (Addendum)
Patient presents from home via ems for periumbilical abdominal pain radiating to mid back. Denies N/V, diaphoresis. EKG in route unremarkable.   Last VS: 130/63, 89hr, 20resp, 99%ra

## 2015-09-09 NOTE — ED Triage Notes (Signed)
Patient presents for epigastric pain starting approximately 0830. Denies N/V/D or fever. A&O x4.

## 2015-09-10 ENCOUNTER — Emergency Department (HOSPITAL_COMMUNITY): Payer: PPO

## 2015-09-10 ENCOUNTER — Emergency Department (HOSPITAL_COMMUNITY)
Admission: EM | Admit: 2015-09-10 | Discharge: 2015-09-10 | Disposition: A | Discharge status: Home / Self Care | Payer: PPO | Attending: Emergency Medicine | Admitting: Emergency Medicine

## 2015-09-10 ENCOUNTER — Encounter (HOSPITAL_COMMUNITY): Payer: Self-pay

## 2015-09-10 DIAGNOSIS — K5900 Constipation, unspecified: Secondary | ICD-10-CM

## 2015-09-10 DIAGNOSIS — R1033 Periumbilical pain: Secondary | ICD-10-CM

## 2015-09-10 DIAGNOSIS — K5730 Diverticulosis of large intestine without perforation or abscess without bleeding: Secondary | ICD-10-CM | POA: Diagnosis not present

## 2015-09-10 LAB — URINALYSIS, ROUTINE W REFLEX MICROSCOPIC
Glucose, UA: NEGATIVE mg/dL
Hgb urine dipstick: NEGATIVE
Ketones, ur: NEGATIVE mg/dL
Nitrite: NEGATIVE
PROTEIN: NEGATIVE mg/dL
Specific Gravity, Urine: 1.027 (ref 1.005–1.030)
pH: 6 (ref 5.0–8.0)

## 2015-09-10 LAB — DIFFERENTIAL
BASOS ABS: 0 10*3/uL (ref 0.0–0.1)
Basophils Relative: 0 %
Eosinophils Absolute: 0.1 10*3/uL (ref 0.0–0.7)
Eosinophils Relative: 1 %
LYMPHS PCT: 15 %
Lymphs Abs: 2 10*3/uL (ref 0.7–4.0)
MONO ABS: 1.1 10*3/uL — AB (ref 0.1–1.0)
MONOS PCT: 8 %
NEUTROS ABS: 10.4 10*3/uL — AB (ref 1.7–7.7)
Neutrophils Relative %: 76 %

## 2015-09-10 LAB — URINE MICROSCOPIC-ADD ON

## 2015-09-10 MED ORDER — IOPAMIDOL (ISOVUE-300) INJECTION 61%
100.0000 mL | Freq: Once | INTRAVENOUS | Status: AC | PRN
  Administered 2015-09-10: 100 mL via INTRAVENOUS

## 2015-09-10 MED ORDER — SODIUM CHLORIDE 0.9 % IV SOLN
Freq: Once | INTRAVENOUS | Status: AC
  Administered 2015-09-10: 03:00:00 via INTRAVENOUS

## 2015-09-10 MED ORDER — ONDANSETRON HCL 4 MG/2ML IJ SOLN
4.0000 mg | Freq: Once | INTRAMUSCULAR | Status: AC
  Administered 2015-09-10: 4 mg via INTRAVENOUS
  Filled 2015-09-10: qty 2

## 2015-09-10 MED ORDER — FENTANYL CITRATE (PF) 100 MCG/2ML IJ SOLN
100.0000 ug | Freq: Once | INTRAMUSCULAR | Status: AC
  Administered 2015-09-10: 100 ug via INTRAVENOUS
  Filled 2015-09-10: qty 2

## 2015-09-10 NOTE — ED Notes (Signed)
Pt in ct 

## 2015-09-10 NOTE — ED Provider Notes (Signed)
[facility] DEPT Provider Note   CSN: [id number] Arrival date & time: 09/09/15  2241  By signing my name below, I, Jasmyn B. Alexander, attest that this documentation has been prepared under the direction and in the presence of Shanon Rosser, MD. Electronically Signed: Tedra Coupe. Sheppard Coil, ED Scribe. 09/10/15. 2:21 AM.  History   Chief Complaint Chief Complaint  Patient presents with  . Abdominal Pain    HPI HPI Comments: Anna Walker is a 60 y.o. female brought in by ambulance, with PMHx of GERD, DM, multiple sclerosis, and hypothyroidism who presents to the Emergency Department complaining of gradual-onset, cramping, Periumbilical abdominal pain which began around 2030 on 09/09/15. She notes that pain radiates to her mid-back. Pain was a 10 out of 10 at its worst. Pain is moderately relieved when bending forward. She reports being constipated for the past three days, and while in [place] she was able to pass to large bowel movements which she describes as being a combination of hard and loose stool. She notes that bowel movement partially relieved her pain and she now rates it a 6 out of 10. She has not taken any laxatives PTA for symptoms. Denies any nausea, fever, vomiting, or diarrhea.   The history is provided by the patient and a relative. No language interpreter was used.   Past Medical History:  Diagnosis Date  . Diabetes mellitus without complication ([place])   . GERD (gastroesophageal reflux disease)   . Multiple sclerosis ([place])   . OSA (obstructive sleep apnea)   . Restless leg syndrome   . Thyroid disease    Hypothyroidism    Patient Active Problem List   Diagnosis Date Noted  . DOE (dyspnea on exertion)   . Substernal chest pain 02/08/2015  . Attention deficit disorder 12/22/2014  . History of optic neuritis 06/20/2014  . OSA on CPAP 06/20/2014  . Restless leg syndrome 06/20/2014  . Other fatigue 06/20/2014  . Migraine without aura and without status migrainosus,  not intractable 06/20/2014  . possible MS 02/24/2014  . Hypothyroidism 02/24/2014  . GERD (gastroesophageal reflux disease) 02/24/2014    Past Surgical History:  Procedure Laterality Date  . ABDOMINAL HYSTERECTOMY    . CESAREAN SECTION      OB History    No data available      Home Medications    Prior to Admission medications   Medication Sig Start Date End Date Taking? Authorizing Provider  amantadine (SYMMETREL) 100 MG capsule TAKE 1 CAPSULE(100 MG) BY MOUTH THREE TIMES DAILY Patient taking differently: TAKE 1 CAPSULE(100 MG) BY MOUTH  TWICE TIMES DAILY 04/06/15  Yes Britt Bottom, MD  aspirin (ASPIRIN EC) 81 MG EC tablet Take 81 mg by mouth daily. Swallow whole.   Yes Historical Provider, MD  FLUOXETINE HCL PO Take 30 mg by mouth daily.   Yes Historical Provider, MD  gabapentin (NEURONTIN) 300 MG capsule 300 mg po qEvening and 600 mg po qHS 06/24/15  Yes Britt Bottom, MD  levothyroxine (SYNTHROID, LEVOTHROID) 175 MCG tablet Take 175 mcg by mouth daily.  01/26/14  Yes Historical Provider, MD  Multiple Vitamins-Minerals (CENTRUM ADULTS PO) Take 1 tablet by mouth daily. Chewable tablet   Yes Historical Provider, MD  omeprazole (PRILOSEC) 40 MG capsule Take 1 capsule (40 mg total) by mouth 2 (two) times daily before a meal. 02/10/15  Yes Barton Dubois, MD  oxybutynin (DITROPAN) 5 MG tablet Take 5 mg by mouth daily.    Yes Historical Provider, MD  phentermine 37.5 MG capsule Take 1 capsule (37.5 mg total) by mouth every morning. 06/24/15  Yes Britt Bottom, MD  rOPINIRole (REQUIP) 3 MG tablet TAKE 1 TABLET BY MOUTH DAILY AT DINNER AND 1 TABLET AT BEDTIME Patient taking differently: TAKE 1 TABLET BY MOUTH DAILY 1 TABLET AT BEDTIME 10/25/14  Yes Britt Bottom, MD  simvastatin (ZOCOR) 10 MG tablet Take 10 mg by mouth daily.   Yes Historical Provider, MD  SUMAtriptan (IMITREX) 100 MG tablet Take 1 tablet (100 mg total) by mouth once as needed for migraine. May repeat in 2 hours if  headache persists or recurs. 06/20/14  Yes Britt Bottom, MD  tiZANidine (ZANAFLEX) 4 MG tablet Take 4 mg by mouth every 6 (six) hours as needed for muscle spasms.   Yes Historical Provider, MD  Zinc 50 MG TABS Take 1 tablet by mouth daily.   Yes Historical Provider, MD  FREESTYLE LITE test strip  02/07/14   Historical Provider, MD  Lancets (FREESTYLE) lancets  02/05/14   Historical Provider, MD  methylPREDNISolone (MEDROL DOSEPAK) 4 MG TBPK tablet Take 6 tablets on day 1, 5 tablets on day 2, 4 tablets on day 3, 3 tablets on day 4, 2 tablets on day 5, and 1 tablet on day 6. Patient not taking: Reported on 09/10/2015 07/02/15   Britt Bottom, MD    Family History Family History  Problem Relation Age of Onset  . Barrett's esophagus Mother   . Graves' disease Sister   . Rheum arthritis Sister   . Coronary artery disease Neg Hx     Social History Social History  Substance Use Topics  . Smoking status: Former Smoker    Quit date: 01/25/1984  . Smokeless tobacco: Never Used  . Alcohol use No     Allergies   Adderall [amphetamine-dextroamphetamine]   Review of Systems Review of Systems 10 Systems reviewed and all are negative for acute change except as noted in the HPI.  Physical Exam Updated Vital Signs BP 133/67 (BP Location: Left Arm)   Pulse 92   Temp 97.8 F (36.6 C) (Oral)   Resp 17   SpO2 97%   Physical Exam General: Well-developed, well-nourished female in no acute distress; appearance consistent with age of record HENT: normocephalic; atraumatic Eyes: pupils equal, round and reactive to light; extraocular muscles intact Neck: supple Heart: regular rate and rhythm Lungs: clear to auscultation bilaterally Abdomen: soft; nondistended; mild diffuse tenderness; no masses or hepatosplenomegaly; bowel sounds present Extremities: No deformity; full range of motion; pulses normal Neurologic: Awake, alert and oriented; motor function intact in all extremities and symmetric;  no facial droop Skin: Warm and dry Psychiatric: Normal mood and affect  ED Treatments / Results   Nursing notes and vitals signs, including pulse oximetry, reviewed.  Summary of this visit's results, reviewed by myself:  Labs:  Results for orders placed or performed during the hospital encounter of 09/10/15 (from the past 24 hour(s))  Lipase, blood     Status: None   Collection Time: 09/09/15 11:24 PM  Result Value Ref Range   Lipase 22 11 - 51 U/L  Comprehensive metabolic panel     Status: Abnormal   Collection Time: 09/09/15 11:24 PM  Result Value Ref Range   Sodium 139 135 - 145 mmol/L   Potassium 3.3 (L) 3.5 - 5.1 mmol/L   Chloride 105 101 - 111 mmol/L   CO2 24 22 - 32 mmol/L   Glucose, Bld 129 (H) 65 -  99 mg/dL   BUN 17 6 - 20 mg/dL   Creatinine, Ser 1.20 (H) 0.44 - 1.00 mg/dL   Calcium 9.2 8.9 - 10.3 mg/dL   Total Protein 7.6 6.5 - 8.1 g/dL   Albumin 4.8 3.5 - 5.0 g/dL   AST 23 15 - 41 U/L   ALT 23 14 - 54 U/L   Alkaline Phosphatase 87 38 - 126 U/L   Total Bilirubin 0.7 0.3 - 1.2 mg/dL   GFR calc non Af Amer 49 (L) >60 mL/min   GFR calc Af Amer 57 (L) >60 mL/min   Anion gap 10 5 - 15  CBC     Status: Abnormal   Collection Time: 09/09/15 11:24 PM  Result Value Ref Range   WBC 14.0 (H) 4.0 - 10.5 K/uL   RBC 4.59 3.87 - 5.11 MIL/uL   Hemoglobin 12.7 12.0 - 15.0 g/dL   HCT 37.8 36.0 - 46.0 %   MCV 82.4 78.0 - 100.0 fL   MCH 27.7 26.0 - 34.0 pg   MCHC 33.6 30.0 - 36.0 g/dL   RDW 14.4 11.5 - 15.5 %   Platelets 292 150 - 400 K/uL  Differential     Status: Abnormal   Collection Time: 09/09/15 11:24 PM  Result Value Ref Range   Neutrophils Relative % 76 %   Neutro Abs 10.4 (H) 1.7 - 7.7 K/uL   Lymphocytes Relative 15 %   Lymphs Abs 2.0 0.7 - 4.0 K/uL   Monocytes Relative 8 %   Monocytes Absolute 1.1 (H) 0.1 - 1.0 K/uL   Eosinophils Relative 1 %   Eosinophils Absolute 0.1 0.0 - 0.7 K/uL   Basophils Relative 0 %   Basophils Absolute 0.0 0.0 - 0.1 K/uL     Imaging Studies: Ct Abdomen Pelvis W Contrast  Result Date: 09/10/2015 CLINICAL DATA:  60 y/o F; increasing epigastric pain radiating to the back and constipation for 3 days. History of hysterectomy. EXAM: CT ABDOMEN AND PELVIS WITH CONTRAST TECHNIQUE: Multidetector CT imaging of the abdomen and pelvis was performed using the standard protocol following bolus administration of intravenous contrast. CONTRAST:  162mL ISOVUE-300 IOPAMIDOL (ISOVUE-300) INJECTION 61% COMPARISON:  None. FINDINGS: Lower chest:  No acute findings. Hepatobiliary: No masses or other significant abnormality. Pancreas: No mass, inflammatory changes, or other significant abnormality. Spleen: Within normal limits in size and appearance. Adrenals/Urinary Tract: No masses identified. No evidence of hydronephrosis. Stomach/Bowel: No evidence of obstruction, inflammatory process, or abnormal fluid collections. Normal appendix. Moderate sigmoid diverticulosis without evidence for diverticulitis. Vascular/Lymphatic: No pathologically enlarged lymph nodes. No evidence of abdominal aortic aneurysm. Reproductive: No mass or other significant abnormality. Status post hysterectomy. Other: None. Musculoskeletal: No suspicious bone lesions identified. Degenerative changes of the lumbar spine greatest at L4-5 and L5-S1. Mild lower lumbar facet arthrosis. IMPRESSION: 1. No acute abnormality is identified as source of patient's pain. 2. Moderate sigmoid diverticulosis without evidence of diverticulitis. 3. Degenerative changes of the lumbar spine. Electronically Signed   By: Kristine Garbe M.D.   On: 09/10/2015 03:35   3:54 AM Patient's pain continues to improve. Her abdomen is soft, nondistended and nontender. Based on history I suspect her pain was due to constipation which she relieved herself in the ED. She was advised to return for worsening or changing symptoms.  Procedures (including critical care time)   Final Clinical  Impressions(s) / ED Diagnoses   Final diagnoses:  Periumbilical abdominal pain  Acute constipation   I personally performed the services described in  this documentation, which was scribed in my presence. The recorded information has been reviewed and is accurate.    Shanon Rosser, MD 09/10/15 [phone]

## 2015-09-15 DIAGNOSIS — K5900 Constipation, unspecified: Secondary | ICD-10-CM | POA: Diagnosis not present

## 2015-09-15 DIAGNOSIS — R109 Unspecified abdominal pain: Secondary | ICD-10-CM | POA: Diagnosis not present

## 2015-10-30 ENCOUNTER — Other Ambulatory Visit: Payer: Self-pay | Admitting: Neurology

## 2015-11-23 DIAGNOSIS — M7581 Other shoulder lesions, right shoulder: Secondary | ICD-10-CM | POA: Diagnosis not present

## 2015-11-23 DIAGNOSIS — M5030 Other cervical disc degeneration, unspecified cervical region: Secondary | ICD-10-CM | POA: Diagnosis not present

## 2015-11-26 ENCOUNTER — Other Ambulatory Visit: Payer: Self-pay | Admitting: Neurology

## 2015-12-15 ENCOUNTER — Other Ambulatory Visit: Payer: Self-pay | Admitting: Neurology

## 2015-12-15 DIAGNOSIS — G4733 Obstructive sleep apnea (adult) (pediatric): Secondary | ICD-10-CM | POA: Diagnosis not present

## 2015-12-15 DIAGNOSIS — E039 Hypothyroidism, unspecified: Secondary | ICD-10-CM | POA: Diagnosis not present

## 2015-12-15 DIAGNOSIS — E119 Type 2 diabetes mellitus without complications: Secondary | ICD-10-CM | POA: Diagnosis not present

## 2015-12-15 DIAGNOSIS — E785 Hyperlipidemia, unspecified: Secondary | ICD-10-CM | POA: Diagnosis not present

## 2015-12-15 DIAGNOSIS — Z23 Encounter for immunization: Secondary | ICD-10-CM | POA: Diagnosis not present

## 2015-12-15 DIAGNOSIS — I10 Essential (primary) hypertension: Secondary | ICD-10-CM | POA: Diagnosis not present

## 2015-12-22 DIAGNOSIS — M4722 Other spondylosis with radiculopathy, cervical region: Secondary | ICD-10-CM | POA: Diagnosis not present

## 2015-12-22 DIAGNOSIS — M542 Cervicalgia: Secondary | ICD-10-CM | POA: Diagnosis not present

## 2015-12-22 DIAGNOSIS — M67911 Unspecified disorder of synovium and tendon, right shoulder: Secondary | ICD-10-CM | POA: Diagnosis not present

## 2015-12-24 ENCOUNTER — Ambulatory Visit (INDEPENDENT_AMBULATORY_CARE_PROVIDER_SITE_OTHER): Payer: PPO | Admitting: Neurology

## 2015-12-24 ENCOUNTER — Encounter: Payer: Self-pay | Admitting: Neurology

## 2015-12-24 VITALS — BP 134/68 | HR 82 | Resp 18 | Ht 66.0 in | Wt 267.0 lb

## 2015-12-24 DIAGNOSIS — G35 Multiple sclerosis: Secondary | ICD-10-CM | POA: Diagnosis not present

## 2015-12-24 DIAGNOSIS — G2581 Restless legs syndrome: Secondary | ICD-10-CM

## 2015-12-24 DIAGNOSIS — R5383 Other fatigue: Secondary | ICD-10-CM

## 2015-12-24 DIAGNOSIS — G43009 Migraine without aura, not intractable, without status migrainosus: Secondary | ICD-10-CM | POA: Diagnosis not present

## 2015-12-24 DIAGNOSIS — Z8669 Personal history of other diseases of the nervous system and sense organs: Secondary | ICD-10-CM

## 2015-12-24 DIAGNOSIS — F988 Other specified behavioral and emotional disorders with onset usually occurring in childhood and adolescence: Secondary | ICD-10-CM | POA: Diagnosis not present

## 2015-12-24 DIAGNOSIS — G4733 Obstructive sleep apnea (adult) (pediatric): Secondary | ICD-10-CM | POA: Diagnosis not present

## 2015-12-24 DIAGNOSIS — Z9989 Dependence on other enabling machines and devices: Secondary | ICD-10-CM

## 2015-12-24 MED ORDER — PHENTERMINE HCL 37.5 MG PO CAPS: 37.5000 mg | ORAL_CAPSULE | Freq: Every morning | ORAL | 5 refills | Status: DC

## 2015-12-24 MED ORDER — SUMATRIPTAN SUCCINATE 100 MG PO TABS: 100.0000 mg | ORAL_TABLET | Freq: Once | ORAL | 5 refills | Status: DC | PRN

## 2015-12-24 NOTE — Progress Notes (Signed)
[facility]  PATIENT: Anna Walker DOB: [birth year]  REFERRING CLINICIAN:Elaine Laurann Montana  HISTORY FROM: Patient REASON FOR VISIT: H/O optic neuritis and possible MS   HISTORICAL  CHIEF COMPLAINT:  Chief Complaint  Patient presents with  . History of Optic Neuritis (Right)    Sts. she remains compliant with CPAP.  Sts. h/a's have been some worse, she believes related to shoulder, neck pain she is having (treated by ortho). Also sts. last h/a affected her left eye, which is different for her.  Sts. she has been doing some home improvements (painting, sanding), and began having right shoulder, neck pain, and numbness right hand/arm.  She saw ortho and was dx. with rotator cuff tendonitis, degen. changes in her C-spine, and was rx'd Meloxicam, oral steroids, Flexeril./fim  . Sleep Apnea  . Migraines  . Weight Loss Surgery    She continues Phentermine and has lost 16 lbs since last ov/fim    HISTORY OF PRESENT ILLNESS:  Anna Walker is a 60 y.o.dwoman with OSA, h/o right optic neuritis and migraines  OSA:   She has obstructive sleep apnea and has been on CPAP since 2000. She reports being compliant with CPAP usage.   She was followed by Dr. Brett Fairy in 2009 and got a newer machine at that time. Weight is similar now as in 2009.  Her machine continues to work well.  No leaks.   She has not had a new mask x 1 year.  She had noted only mild sleepiness and only rarely dozes off watching TV or other activities.    Insomnia/RLS/PLMS:  She has insomnia due to RLS and muscle cramps (thighs).   Her restless leg syndrome/periodic limb movements of sleep does better on gabapentin (300 in evening and 600 at bedtime) and ropinirole (3 mg) combination. She tolerated tizanidine better than the cyclobenzaprine but cyclobenzaprine was better for her neck muscle aches (has C5C6 DJD).   She notes RLS anytime she is still, esp in evening and night    Migraine Headache:   She is  doing better and has had only a couple headaches since last visit/   Imitrex helps when she has one.     Headaches are usually right frontal when they occur.   Fatigue/ADD:   She has Fatigue and attention deficit.    Phentermine monotherapy is helping her a fair amount but not as much as  Adderall.     Adderall was not covered by insurance.   She has Neuropsych testing form 2002 while in [place].   She was found to have ADD by Dr. Sheran Lawless and she recommended that she try stimulants.   She still notes some mental fog and decreased attention and STM with word finding issues.   Phentermine helps this as well (though Adderall helped more)  ON/Concern about MS:   She had right optic neuritis in 1998. LP at that time showed one oligoclonal band.  MRI brain and C-spine did not show additional plaques.    A recent ophthalmological exam was consistent with a remote right optic neuritis.   She brought in other paperwork and repeat LP in 2002 was normal (no OCB and IgG ndex = 0.6) .   I stopped her Avonex in 2016 (she was on > 10 years) and she has had no exacerbation or other symptoms since stopping.  .  A repeat MRI on 04/21/2014  that I personally reviewed was essentially normal.     She continues to note mild  blurriness out of that eye and also has photosensitivity.   REVIEW OF SYSTEMS:  Constitutional: No fevers, chills, sweats, or change in appetite.   Sleep issues as above Eyes: see above.  She is  light sensitive with mild eye pain at times Ear, nose and throat: No hearing loss, ear pain, nasal congestion, sore throat Cardiovascular: No chest pain, palpitations Respiratory:  Occ episodes of shortness of breath.  No wheezes GastrointestinaI: No nausea, vomiting, diarrhea, abdominal pain, fecal incontinence.   Mild dysphagia at times.  Genitourinary:  No dysuria, urinary retention or frequency.  No nocturia. Musculoskeletal:  Notes neck pain, back pain with some stiffness.   Integumentary: No rash,  pruritus, skin lesions Neurological: as above Psychiatric: No depression at this time.  No anxiety Endocrine: No palpitations,  change in appetite, change in weigh or increased thirst.  Some heat intolerance.   Hematologic/Lymphatic:  No anemia, purpura, petechiae.   She sometimes bruises easily.   Allergic/Immunologic: No itchy/runny eyes, nasal congestion, recent allergic reactions, rashes  ALLERGIES: Allergies  Allergen Reactions  . Adderall [Amphetamine-Dextroamphetamine] Other (See Comments)    Caused elevated BP    HOME MEDICATIONS: Outpatient Medications Prior to Visit  Medication Sig Dispense Refill  . aspirin (ASPIRIN EC) 81 MG EC tablet Take 81 mg by mouth daily. Swallow whole.    Marland Kitchen FLUOXETINE HCL PO Take 30 mg by mouth daily.    Marland Kitchen FREESTYLE LITE test strip   4  . gabapentin (NEURONTIN) 300 MG capsule 300 mg po qEvening and 600 mg po qHS 90 capsule 11  . Lancets (FREESTYLE) lancets   5  . levothyroxine (SYNTHROID, LEVOTHROID) 175 MCG tablet Take 175 mcg by mouth daily.   3  . Multiple Vitamins-Minerals (CENTRUM ADULTS PO) Take 1 tablet by mouth daily. Chewable tablet    . omeprazole (PRILOSEC) 40 MG capsule Take 1 capsule (40 mg total) by mouth 2 (two) times daily before a meal. 60 capsule 1  . oxybutynin (DITROPAN) 5 MG tablet Take 5 mg by mouth daily.     . phentermine 37.5 MG capsule TAKE ONE CAPSULE BY MOUTH EVERY MORNING 30 capsule 0  . rOPINIRole (REQUIP) 3 MG tablet TAKE 1 TABLET BY MOUTH DAILY AT DINNER AND 1 TABLET AT BEDTIME 60 tablet 5  . simvastatin (ZOCOR) 10 MG tablet Take 10 mg by mouth daily.    . SUMAtriptan (IMITREX) 100 MG tablet Take 1 tablet (100 mg total) by mouth once as needed for migraine. May repeat in 2 hours if headache persists or recurs. 10 tablet 2  . tiZANidine (ZANAFLEX) 4 MG tablet Take 4 mg by mouth every 6 (six) hours as needed for muscle spasms.    . Zinc 50 MG TABS Take 1 tablet by mouth daily.    Marland Kitchen amantadine (SYMMETREL) 100 MG capsule  TAKE 1 CAPSULE(100 MG) BY MOUTH THREE TIMES DAILY (Patient taking differently: TAKE 1 CAPSULE(100 MG) BY MOUTH  TWICE TIMES DAILY) 90 capsule 11   No facility-administered medications prior to visit.     PAST MEDICAL HISTORY: Past Medical History:  Diagnosis Date  . Diabetes mellitus without complication ([place])   . GERD (gastroesophageal reflux disease)   . Multiple sclerosis ([place])   . OSA (obstructive sleep apnea)   . Restless leg syndrome   . Thyroid disease    Hypothyroidism    PAST SURGICAL HISTORY: Past Surgical History:  Procedure Laterality Date  . ABDOMINAL HYSTERECTOMY    . CESAREAN SECTION  FAMILY HISTORY: No FH of MS.  Her sister and aunt have RA.  Sister has hepatitis from RA treatment  SOCIAL HISTORY:  Social History   Social History  . Marital status: Married    Spouse name: N/A  . Number of children: N/A  . Years of education: N/A   Occupational History  . Not on file.   Social History Main Topics  . Smoking status: Former Smoker    Quit date: 01/25/1984  . Smokeless tobacco: Never Used  . Alcohol use No  . Drug use: No  . Sexual activity: Yes    Birth control/ protection: Surgical   Other Topics Concern  . Not on file   Social History Narrative  . No narrative on file     PHYSICAL EXAM  Vitals:   12/24/15 1041  BP: 134/68  Pulse: 82  Resp: 18  Weight: 267 lb (121.1 kg)  Height: 5\' 6"  (1.676 m)    Body mass index is 43.09 kg/m.   General: The patient is well-developed and well-nourished and in no acute distress.   Pharynx is crowded (Mallampati 3).    Stye OD.    Neurologic Exam  Mental status: The patient is alert and oriented x 3 at the time of the examination.  Speech is normal.  Cranial nerves: Extraocular movements are full.  2+ Right APD.   Reduced color vision out of OD    Facial symmetry is present. She reports asymmetric facial sensation to soft touch and temp.Facial strength is normal.  Trapezius and  sternocleidomastoid strength is normal. No dysarthria is noted.   No obvious hearing deficits are noted.  Motor:  Muscle bulk and tone are normal. Strength is  5 / 5 in all 4 extremities.   Sensory: She reports mildly decreased sensation to touch and vibration in the left arm compared to the right arm. She has more symmetric sensation to touch and vibration and temperature in the legs.   Coordination: Cerebellar testing reveals good finger-nose-finger.  Gait and station: Station is normal and gait is mildly wide. Tandem gait is wide.   Reflexes: Deep tendon reflexes are symmetric and normal bilaterally.      DIAGNOSTIC DATA (LABS, IMAGING, TESTING) - I reviewed patient records, labs, notes, testing and imaging myself where available.  Lab Results  Component Value Date   WBC 14.0 (H) 09/09/2015   HGB 12.7 09/09/2015   HCT 37.8 09/09/2015   MCV 82.4 09/09/2015   PLT 292 09/09/2015      Component Value Date/Time   NA 139 09/09/2015 2324   K 3.3 (L) 09/09/2015 2324   CL 105 09/09/2015 2324   CO2 24 09/09/2015 2324   GLUCOSE 129 (H) 09/09/2015 2324   BUN 17 09/09/2015 2324   CREATININE 1.20 (H) 09/09/2015 2324   CALCIUM 9.2 09/09/2015 2324   PROT 7.6 09/09/2015 2324   ALBUMIN 4.8 09/09/2015 2324   AST 23 09/09/2015 2324   ALT 23 09/09/2015 2324   ALKPHOS 87 09/09/2015 2324   BILITOT 0.7 09/09/2015 2324   GFRNONAA 49 (L) 09/09/2015 2324   GFRAA 57 (L) 09/09/2015 2324        ASSESSMENT AND PLAN  OSA on CPAP  Restless leg syndrome  Other fatigue  Attention deficit disorder, unspecified hyperactivity presence  History of optic neuritis  possible MS  Migraine without aura and without status migrainosus, not intractable    1.   Continue CPAP for OSA.   Script for new mask/supplies (wants [facility]) 2.    Continue phentermine 3.   Renew sumatriptan for migraine 4.   Gabapentin to 300 q evening and 600 mg po qHS for RLS.   Tizanidine as needed 5.    Try to remain active and exercise as tolerated.  Return to see me in 6 months or sooner if problems.      Akyla Vavrek A. Felecia Shelling, MD, PhD [id number], [id number] AM Certified in Neurology, Clinical Neurophysiology, Sleep Medicine, Pain Medicine and Neuroimaging  [facility] [address] [place], [place] [id number] [phone] d1

## 2015-12-31 DIAGNOSIS — M542 Cervicalgia: Secondary | ICD-10-CM | POA: Diagnosis not present

## 2015-12-31 DIAGNOSIS — M25511 Pain in right shoulder: Secondary | ICD-10-CM | POA: Diagnosis not present

## 2016-01-05 DIAGNOSIS — M25511 Pain in right shoulder: Secondary | ICD-10-CM | POA: Diagnosis not present

## 2016-01-05 DIAGNOSIS — M542 Cervicalgia: Secondary | ICD-10-CM | POA: Diagnosis not present

## 2016-01-12 DIAGNOSIS — M542 Cervicalgia: Secondary | ICD-10-CM | POA: Diagnosis not present

## 2016-01-12 DIAGNOSIS — M25511 Pain in right shoulder: Secondary | ICD-10-CM | POA: Diagnosis not present

## 2016-01-14 DIAGNOSIS — M542 Cervicalgia: Secondary | ICD-10-CM | POA: Diagnosis not present

## 2016-01-14 DIAGNOSIS — M25511 Pain in right shoulder: Secondary | ICD-10-CM | POA: Diagnosis not present

## 2016-01-22 DIAGNOSIS — M25511 Pain in right shoulder: Secondary | ICD-10-CM | POA: Diagnosis not present

## 2016-01-22 DIAGNOSIS — M542 Cervicalgia: Secondary | ICD-10-CM | POA: Diagnosis not present

## 2016-01-26 DIAGNOSIS — M542 Cervicalgia: Secondary | ICD-10-CM | POA: Diagnosis not present

## 2016-01-26 DIAGNOSIS — M4722 Other spondylosis with radiculopathy, cervical region: Secondary | ICD-10-CM | POA: Diagnosis not present

## 2016-02-26 ENCOUNTER — Other Ambulatory Visit: Payer: Self-pay | Admitting: Orthopedic Surgery

## 2016-02-26 DIAGNOSIS — M4722 Other spondylosis with radiculopathy, cervical region: Secondary | ICD-10-CM

## 2016-02-26 DIAGNOSIS — M542 Cervicalgia: Secondary | ICD-10-CM

## 2016-03-01 DIAGNOSIS — E039 Hypothyroidism, unspecified: Secondary | ICD-10-CM | POA: Diagnosis not present

## 2016-03-10 ENCOUNTER — Inpatient Hospital Stay
Admission: RE | Admit: 2016-03-10 | Discharge: 2016-03-10 | Disposition: A | Discharge status: Home / Self Care | Payer: PPO | Source: Ambulatory Visit | Attending: Orthopedic Surgery | Admitting: Orthopedic Surgery

## 2016-03-24 ENCOUNTER — Ambulatory Visit
Admission: RE | Admit: 2016-03-24 | Discharge: 2016-03-24 | Disposition: A | Discharge status: Home / Self Care | Payer: PPO | Source: Ambulatory Visit | Attending: Orthopedic Surgery | Admitting: Orthopedic Surgery

## 2016-03-24 DIAGNOSIS — M4722 Other spondylosis with radiculopathy, cervical region: Secondary | ICD-10-CM

## 2016-03-24 DIAGNOSIS — M4802 Spinal stenosis, cervical region: Secondary | ICD-10-CM | POA: Diagnosis not present

## 2016-03-24 DIAGNOSIS — M542 Cervicalgia: Secondary | ICD-10-CM

## 2016-04-02 DIAGNOSIS — M1711 Unilateral primary osteoarthritis, right knee: Secondary | ICD-10-CM | POA: Diagnosis not present

## 2016-04-02 DIAGNOSIS — M1712 Unilateral primary osteoarthritis, left knee: Secondary | ICD-10-CM | POA: Diagnosis not present

## 2016-04-02 DIAGNOSIS — S83242A Other tear of medial meniscus, current injury, left knee, initial encounter: Secondary | ICD-10-CM | POA: Diagnosis not present

## 2016-04-12 DIAGNOSIS — M25562 Pain in left knee: Secondary | ICD-10-CM | POA: Diagnosis not present

## 2016-04-27 DIAGNOSIS — M23222 Derangement of posterior horn of medial meniscus due to old tear or injury, left knee: Secondary | ICD-10-CM | POA: Diagnosis not present

## 2016-04-27 DIAGNOSIS — M6751 Plica syndrome, right knee: Secondary | ICD-10-CM | POA: Diagnosis not present

## 2016-04-27 DIAGNOSIS — G8918 Other acute postprocedural pain: Secondary | ICD-10-CM | POA: Diagnosis not present

## 2016-04-27 DIAGNOSIS — M94262 Chondromalacia, left knee: Secondary | ICD-10-CM | POA: Diagnosis not present

## 2016-04-27 DIAGNOSIS — M23221 Derangement of posterior horn of medial meniscus due to old tear or injury, right knee: Secondary | ICD-10-CM | POA: Diagnosis not present

## 2016-04-27 DIAGNOSIS — M94261 Chondromalacia, right knee: Secondary | ICD-10-CM | POA: Diagnosis not present

## 2016-05-02 ENCOUNTER — Other Ambulatory Visit (HOSPITAL_COMMUNITY): Payer: Self-pay | Admitting: Orthopedic Surgery

## 2016-05-02 DIAGNOSIS — M79605 Pain in left leg: Secondary | ICD-10-CM

## 2016-05-02 DIAGNOSIS — M7989 Other specified soft tissue disorders: Principal | ICD-10-CM

## 2016-05-02 DIAGNOSIS — M25462 Effusion, left knee: Secondary | ICD-10-CM | POA: Diagnosis not present

## 2016-05-03 ENCOUNTER — Ambulatory Visit (HOSPITAL_COMMUNITY)
Admission: RE | Admit: 2016-05-03 | Discharge: 2016-05-03 | Disposition: A | Discharge status: Home / Self Care | Payer: PPO | Source: Ambulatory Visit | Attending: Family Medicine | Admitting: Family Medicine

## 2016-05-03 DIAGNOSIS — M7989 Other specified soft tissue disorders: Secondary | ICD-10-CM | POA: Diagnosis not present

## 2016-05-03 DIAGNOSIS — M79605 Pain in left leg: Secondary | ICD-10-CM | POA: Diagnosis not present

## 2016-05-03 DIAGNOSIS — I82402 Acute embolism and thrombosis of unspecified deep veins of left lower extremity: Secondary | ICD-10-CM | POA: Diagnosis not present

## 2016-05-03 DIAGNOSIS — R938 Abnormal findings on diagnostic imaging of other specified body structures: Secondary | ICD-10-CM | POA: Insufficient documentation

## 2016-05-03 NOTE — Progress Notes (Signed)
*  PRELIMINARY RESULTS* Vascular Ultrasound Left lower extremity venous duplex has been completed.  Preliminary findings: DVT noted in the left gastroc veins. Left baker's cyst also noted.   Called results to [place]. She will give results to Dr. Berenice Primas and they will contact patient about treatment. Patient will be released from vascular lab. 9:05 AM    Landry Mellow, RDMS, RVT  05/03/2016

## 2016-05-10 DIAGNOSIS — M25462 Effusion, left knee: Secondary | ICD-10-CM | POA: Diagnosis not present

## 2016-05-13 DIAGNOSIS — I82402 Acute embolism and thrombosis of unspecified deep veins of left lower extremity: Secondary | ICD-10-CM | POA: Diagnosis not present

## 2016-05-16 DIAGNOSIS — Z4789 Encounter for other orthopedic aftercare: Secondary | ICD-10-CM | POA: Diagnosis not present

## 2016-05-16 DIAGNOSIS — M25562 Pain in left knee: Secondary | ICD-10-CM | POA: Diagnosis not present

## 2016-05-16 DIAGNOSIS — I82402 Acute embolism and thrombosis of unspecified deep veins of left lower extremity: Secondary | ICD-10-CM | POA: Diagnosis not present

## 2016-05-20 DIAGNOSIS — I82402 Acute embolism and thrombosis of unspecified deep veins of left lower extremity: Secondary | ICD-10-CM | POA: Diagnosis not present

## 2016-05-20 DIAGNOSIS — M25562 Pain in left knee: Secondary | ICD-10-CM | POA: Diagnosis not present

## 2016-05-20 DIAGNOSIS — Z4789 Encounter for other orthopedic aftercare: Secondary | ICD-10-CM | POA: Diagnosis not present

## 2016-05-23 DIAGNOSIS — Z4789 Encounter for other orthopedic aftercare: Secondary | ICD-10-CM | POA: Diagnosis not present

## 2016-05-23 DIAGNOSIS — M25562 Pain in left knee: Secondary | ICD-10-CM | POA: Diagnosis not present

## 2016-05-23 DIAGNOSIS — I82402 Acute embolism and thrombosis of unspecified deep veins of left lower extremity: Secondary | ICD-10-CM | POA: Diagnosis not present

## 2016-05-27 DIAGNOSIS — I82402 Acute embolism and thrombosis of unspecified deep veins of left lower extremity: Secondary | ICD-10-CM | POA: Diagnosis not present

## 2016-05-27 DIAGNOSIS — Z4789 Encounter for other orthopedic aftercare: Secondary | ICD-10-CM | POA: Diagnosis not present

## 2016-05-27 DIAGNOSIS — M25562 Pain in left knee: Secondary | ICD-10-CM | POA: Diagnosis not present

## 2016-05-31 ENCOUNTER — Other Ambulatory Visit: Payer: Self-pay | Admitting: Family Medicine

## 2016-05-31 DIAGNOSIS — M25562 Pain in left knee: Secondary | ICD-10-CM | POA: Diagnosis not present

## 2016-05-31 DIAGNOSIS — K219 Gastro-esophageal reflux disease without esophagitis: Secondary | ICD-10-CM | POA: Diagnosis not present

## 2016-05-31 DIAGNOSIS — R079 Chest pain, unspecified: Secondary | ICD-10-CM | POA: Diagnosis not present

## 2016-05-31 DIAGNOSIS — Z4789 Encounter for other orthopedic aftercare: Secondary | ICD-10-CM | POA: Diagnosis not present

## 2016-05-31 DIAGNOSIS — M25462 Effusion, left knee: Secondary | ICD-10-CM | POA: Diagnosis not present

## 2016-05-31 DIAGNOSIS — I82402 Acute embolism and thrombosis of unspecified deep veins of left lower extremity: Secondary | ICD-10-CM | POA: Diagnosis not present

## 2016-06-01 ENCOUNTER — Ambulatory Visit
Admission: RE | Admit: 2016-06-01 | Discharge: 2016-06-01 | Disposition: A | Discharge status: Home / Self Care | Payer: PPO | Source: Ambulatory Visit | Attending: Family Medicine | Admitting: Family Medicine

## 2016-06-01 DIAGNOSIS — R079 Chest pain, unspecified: Secondary | ICD-10-CM

## 2016-06-01 MED ORDER — IOPAMIDOL (ISOVUE-370) INJECTION 76%
80.0000 mL | Freq: Once | INTRAVENOUS | Status: AC | PRN
  Administered 2016-06-01: 80 mL via INTRAVENOUS

## 2016-06-03 DIAGNOSIS — I82402 Acute embolism and thrombosis of unspecified deep veins of left lower extremity: Secondary | ICD-10-CM | POA: Diagnosis not present

## 2016-06-03 DIAGNOSIS — M25562 Pain in left knee: Secondary | ICD-10-CM | POA: Diagnosis not present

## 2016-06-03 DIAGNOSIS — Z4789 Encounter for other orthopedic aftercare: Secondary | ICD-10-CM | POA: Diagnosis not present

## 2016-06-07 DIAGNOSIS — K219 Gastro-esophageal reflux disease without esophagitis: Secondary | ICD-10-CM | POA: Diagnosis not present

## 2016-06-07 DIAGNOSIS — G2581 Restless legs syndrome: Secondary | ICD-10-CM | POA: Diagnosis not present

## 2016-06-07 DIAGNOSIS — Z0000 Encounter for general adult medical examination without abnormal findings: Secondary | ICD-10-CM | POA: Diagnosis not present

## 2016-06-07 DIAGNOSIS — E785 Hyperlipidemia, unspecified: Secondary | ICD-10-CM | POA: Diagnosis not present

## 2016-06-07 DIAGNOSIS — E119 Type 2 diabetes mellitus without complications: Secondary | ICD-10-CM | POA: Diagnosis not present

## 2016-06-07 DIAGNOSIS — M25562 Pain in left knee: Secondary | ICD-10-CM | POA: Diagnosis not present

## 2016-06-07 DIAGNOSIS — I82402 Acute embolism and thrombosis of unspecified deep veins of left lower extremity: Secondary | ICD-10-CM | POA: Diagnosis not present

## 2016-06-07 DIAGNOSIS — M25569 Pain in unspecified knee: Secondary | ICD-10-CM | POA: Diagnosis not present

## 2016-06-07 DIAGNOSIS — I10 Essential (primary) hypertension: Secondary | ICD-10-CM | POA: Diagnosis not present

## 2016-06-07 DIAGNOSIS — Z4789 Encounter for other orthopedic aftercare: Secondary | ICD-10-CM | POA: Diagnosis not present

## 2016-06-07 DIAGNOSIS — Z1389 Encounter for screening for other disorder: Secondary | ICD-10-CM | POA: Diagnosis not present

## 2016-06-07 DIAGNOSIS — G35 Multiple sclerosis: Secondary | ICD-10-CM | POA: Diagnosis not present

## 2016-06-07 DIAGNOSIS — E039 Hypothyroidism, unspecified: Secondary | ICD-10-CM | POA: Diagnosis not present

## 2016-06-07 DIAGNOSIS — G4733 Obstructive sleep apnea (adult) (pediatric): Secondary | ICD-10-CM | POA: Diagnosis not present

## 2016-06-10 DIAGNOSIS — M25562 Pain in left knee: Secondary | ICD-10-CM | POA: Diagnosis not present

## 2016-06-10 DIAGNOSIS — I82402 Acute embolism and thrombosis of unspecified deep veins of left lower extremity: Secondary | ICD-10-CM | POA: Diagnosis not present

## 2016-06-10 DIAGNOSIS — Z4789 Encounter for other orthopedic aftercare: Secondary | ICD-10-CM | POA: Diagnosis not present

## 2016-06-14 DIAGNOSIS — I82402 Acute embolism and thrombosis of unspecified deep veins of left lower extremity: Secondary | ICD-10-CM | POA: Diagnosis not present

## 2016-06-14 DIAGNOSIS — Z4789 Encounter for other orthopedic aftercare: Secondary | ICD-10-CM | POA: Diagnosis not present

## 2016-06-14 DIAGNOSIS — M25562 Pain in left knee: Secondary | ICD-10-CM | POA: Diagnosis not present

## 2016-06-17 DIAGNOSIS — M25562 Pain in left knee: Secondary | ICD-10-CM | POA: Diagnosis not present

## 2016-06-17 DIAGNOSIS — Z4789 Encounter for other orthopedic aftercare: Secondary | ICD-10-CM | POA: Diagnosis not present

## 2016-06-17 DIAGNOSIS — I82402 Acute embolism and thrombosis of unspecified deep veins of left lower extremity: Secondary | ICD-10-CM | POA: Diagnosis not present

## 2016-06-21 DIAGNOSIS — M25562 Pain in left knee: Secondary | ICD-10-CM | POA: Diagnosis not present

## 2016-06-21 DIAGNOSIS — I82402 Acute embolism and thrombosis of unspecified deep veins of left lower extremity: Secondary | ICD-10-CM | POA: Diagnosis not present

## 2016-06-21 DIAGNOSIS — Z4789 Encounter for other orthopedic aftercare: Secondary | ICD-10-CM | POA: Diagnosis not present

## 2016-06-27 ENCOUNTER — Ambulatory Visit: Payer: PPO | Admitting: Neurology

## 2016-06-28 DIAGNOSIS — I82402 Acute embolism and thrombosis of unspecified deep veins of left lower extremity: Secondary | ICD-10-CM | POA: Diagnosis not present

## 2016-06-28 DIAGNOSIS — Z4789 Encounter for other orthopedic aftercare: Secondary | ICD-10-CM | POA: Diagnosis not present

## 2016-06-28 DIAGNOSIS — M1712 Unilateral primary osteoarthritis, left knee: Secondary | ICD-10-CM | POA: Diagnosis not present

## 2016-06-28 DIAGNOSIS — M25562 Pain in left knee: Secondary | ICD-10-CM | POA: Diagnosis not present

## 2016-08-15 ENCOUNTER — Other Ambulatory Visit: Payer: Self-pay | Admitting: Pharmacy Technician

## 2016-08-15 NOTE — Patient Outreach (Signed)
[place] [facility]) Care Management  08/15/2016  Anna Walker 08-17-1955 [id number]  Contacted patient in reference to medication adherence for [employer]. Patient did not answer, had to leave a voicemail.  Doreene Burke, [place] [phone]

## 2016-08-16 ENCOUNTER — Other Ambulatory Visit: Payer: Self-pay | Admitting: Pharmacy Technician

## 2016-08-16 NOTE — Patient Outreach (Signed)
[place] [facility]) Care Management  08/16/2016  Anna Walker July 06, 1955 [id number]  I contacted patient in reference to medication adherence for [employer]. After speaking with the patient I was able to confirm that her adherence rate is being affected due to the pharmacy entering the incorrect days supply. The patient verified that she is taking Simvastatin once daily as prescribed and she does not miss any doses. She was curious as to why she had so much medication so I was able to explain to her what was happening and encouraged her to temporarily suspend automatic refills. I will send this information to [employer] to see if the claims can be corrected for this year.  Doreene Burke, [place] [phone]

## 2016-08-19 ENCOUNTER — Other Ambulatory Visit: Payer: Self-pay | Admitting: Neurology

## 2016-08-19 DIAGNOSIS — I10 Essential (primary) hypertension: Secondary | ICD-10-CM | POA: Diagnosis not present

## 2016-08-19 DIAGNOSIS — R609 Edema, unspecified: Secondary | ICD-10-CM | POA: Diagnosis not present

## 2016-08-23 DIAGNOSIS — M25462 Effusion, left knee: Secondary | ICD-10-CM | POA: Diagnosis not present

## 2016-08-23 DIAGNOSIS — M1712 Unilateral primary osteoarthritis, left knee: Secondary | ICD-10-CM | POA: Diagnosis not present

## 2016-09-15 ENCOUNTER — Other Ambulatory Visit: Payer: Self-pay | Admitting: Neurology

## 2016-09-22 ENCOUNTER — Other Ambulatory Visit: Payer: Self-pay | Admitting: Neurology

## 2016-10-16 ENCOUNTER — Other Ambulatory Visit: Payer: Self-pay | Admitting: Neurology

## 2016-11-16 ENCOUNTER — Other Ambulatory Visit: Payer: Self-pay | Admitting: Neurology

## 2016-11-22 DIAGNOSIS — Z1231 Encounter for screening mammogram for malignant neoplasm of breast: Secondary | ICD-10-CM | POA: Diagnosis not present

## 2016-11-25 DIAGNOSIS — R109 Unspecified abdominal pain: Secondary | ICD-10-CM | POA: Diagnosis not present

## 2016-11-25 DIAGNOSIS — R51 Headache: Secondary | ICD-10-CM | POA: Diagnosis not present

## 2016-11-25 DIAGNOSIS — Z23 Encounter for immunization: Secondary | ICD-10-CM | POA: Diagnosis not present

## 2016-11-29 DIAGNOSIS — R109 Unspecified abdominal pain: Secondary | ICD-10-CM | POA: Diagnosis not present

## 2016-12-12 DIAGNOSIS — I10 Essential (primary) hypertension: Secondary | ICD-10-CM | POA: Diagnosis not present

## 2016-12-12 DIAGNOSIS — E119 Type 2 diabetes mellitus without complications: Secondary | ICD-10-CM | POA: Diagnosis not present

## 2016-12-12 DIAGNOSIS — E785 Hyperlipidemia, unspecified: Secondary | ICD-10-CM | POA: Diagnosis not present

## 2016-12-12 DIAGNOSIS — E039 Hypothyroidism, unspecified: Secondary | ICD-10-CM | POA: Diagnosis not present

## 2016-12-12 DIAGNOSIS — R1013 Epigastric pain: Secondary | ICD-10-CM | POA: Diagnosis not present

## 2016-12-16 ENCOUNTER — Other Ambulatory Visit: Payer: Self-pay | Admitting: Neurology

## 2017-01-23 ENCOUNTER — Other Ambulatory Visit: Payer: Self-pay | Admitting: Neurology

## 2017-01-25 ENCOUNTER — Telehealth: Payer: Self-pay | Admitting: Neurology

## 2017-01-25 ENCOUNTER — Other Ambulatory Visit: Payer: Self-pay | Admitting: Neurology

## 2017-01-25 MED ORDER — AMANTADINE HCL 100 MG PO CAPS: ORAL_CAPSULE | ORAL | 1 refills | Status: DC

## 2017-01-25 NOTE — Telephone Encounter (Signed)
Pt request refill for amantadine (SYMMETREL) 100 MG capsule sent to [facility]. Pt is aware she needs to be seen on yearly basis for refills. Pt said she had knee surgery and then a week later a blood clot was found. She had to c/a her appt and then forgot to r/s it. Pt said she is out of the medication and can hardly get out of the bed, she is wanting to know if a refill could be sent to hold her until her appt on 03/13/16. Pt also wanted provider to be made aware the optic neuritis has flared up for the past couple of days. Please call to advise

## 2017-01-25 NOTE — Telephone Encounter (Signed)
Amantidine rx. escribed to [facility] as requested/fim

## 2017-01-29 ENCOUNTER — Encounter (HOSPITAL_COMMUNITY): Payer: Self-pay

## 2017-01-29 ENCOUNTER — Telehealth: Payer: Self-pay | Admitting: Neurology

## 2017-01-29 ENCOUNTER — Other Ambulatory Visit: Payer: Self-pay

## 2017-01-29 ENCOUNTER — Emergency Department (HOSPITAL_COMMUNITY)
Admission: EM | Admit: 2017-01-29 | Discharge: 2017-01-30 | Disposition: A | Discharge status: Home / Self Care | Payer: PPO | Attending: Emergency Medicine | Admitting: Emergency Medicine

## 2017-01-29 DIAGNOSIS — Z8669 Personal history of other diseases of the nervous system and sense organs: Secondary | ICD-10-CM | POA: Insufficient documentation

## 2017-01-29 DIAGNOSIS — H5711 Ocular pain, right eye: Secondary | ICD-10-CM

## 2017-01-29 DIAGNOSIS — E039 Hypothyroidism, unspecified: Secondary | ICD-10-CM | POA: Diagnosis not present

## 2017-01-29 DIAGNOSIS — Z79899 Other long term (current) drug therapy: Secondary | ICD-10-CM | POA: Insufficient documentation

## 2017-01-29 DIAGNOSIS — Z7982 Long term (current) use of aspirin: Secondary | ICD-10-CM | POA: Insufficient documentation

## 2017-01-29 DIAGNOSIS — E119 Type 2 diabetes mellitus without complications: Secondary | ICD-10-CM | POA: Insufficient documentation

## 2017-01-29 LAB — COMPREHENSIVE METABOLIC PANEL
ALBUMIN: 3.8 g/dL (ref 3.5–5.0)
ALK PHOS: 81 U/L (ref 38–126)
ALT: 23 U/L (ref 14–54)
AST: 21 U/L (ref 15–41)
Anion gap: 6 (ref 5–15)
BILIRUBIN TOTAL: 0.9 mg/dL (ref 0.3–1.2)
BUN: 10 mg/dL (ref 6–20)
CO2: 28 mmol/L (ref 22–32)
Calcium: 9 mg/dL (ref 8.9–10.3)
Chloride: 103 mmol/L (ref 101–111)
Creatinine, Ser: 0.85 mg/dL (ref 0.44–1.00)
GFR calc Af Amer: >60 mL/min (ref >60)
GFR calc non Af Amer: >60 mL/min (ref >60)
GLUCOSE: 97 mg/dL (ref 65–99)
POTASSIUM: 3.7 mmol/L (ref 3.5–5.1)
Sodium: 137 mmol/L (ref 135–145)
TOTAL PROTEIN: 7 g/dL (ref 6.5–8.1)

## 2017-01-29 LAB — CBC
HCT: 39.2 % (ref 36.0–46.0)
Hemoglobin: 12.3 g/dL (ref 12.0–15.0)
MCH: 26.4 pg (ref 26.0–34.0)
MCHC: 31.4 g/dL (ref 30.0–36.0)
MCV: 84.1 fL (ref 78.0–100.0)
Platelets: 287 K/uL (ref 150–400)
RBC: 4.66 MIL/uL (ref 3.87–5.11)
RDW: 13.9 % (ref 11.5–15.5)
WBC: 6.5 K/uL (ref 4.0–10.5)

## 2017-01-29 MED ORDER — TETRACAINE HCL 0.5 % OP SOLN
2.0000 [drp] | Freq: Once | OPHTHALMIC | Status: AC
  Administered 2017-01-29: 2 [drp] via OPHTHALMIC
  Filled 2017-01-29: qty 4

## 2017-01-29 NOTE — ED Triage Notes (Signed)
Patient complains of right eye pain intermittent since christmas and general fatigue. States that she thinks this may be related to her MS. No neuro deficits, alert and oriented

## 2017-01-29 NOTE — ED Provider Notes (Signed)
[facility] EMERGENCY DEPARTMENT Provider Note   CSN: [id number] Arrival date & time: 01/29/17  1500     History   Chief Complaint No chief complaint on file.   HPI ALEGRA ROST is a 62 y.o. female.  The history is provided by the patient and medical records. No language interpreter was used.     ALOHA BARTOK is a 62 y.o. female  with a PMH of possible multiple sclerosis and hx of right optic neuritis in 1998 who presents to the Emergency Department complaining of persistent right eye pain x 1-2 weeks. Patient states that she will have right eye pain daily but the pain will come and go throughout the day. She does have blurriness to the right eye at baseline, but this has worsened over the last week. Left eye normal vision. Feels similar to pain she experienced with flare of optic neuritis several years ago. No medications taken prior to arrival for symptoms. No alleviating or aggravating factors noted. No headaches, fever, chills, neck pain. Does endorse associated fatigue. Will occasionally drop things or be clumsy with items, but denies any weakness.   Past Medical History:  Diagnosis Date  . Diabetes mellitus without complication ([place])   . GERD (gastroesophageal reflux disease)   . Multiple sclerosis ([place])   . OSA (obstructive sleep apnea)   . Restless leg syndrome   . Thyroid disease    Hypothyroidism    Patient Active Problem List   Diagnosis Date Noted  . DOE (dyspnea on exertion)   . Substernal chest pain 02/08/2015  . Attention deficit disorder 12/22/2014  . History of optic neuritis 06/20/2014  . OSA on CPAP 06/20/2014  . Restless leg syndrome 06/20/2014  . Other fatigue 06/20/2014  . Migraine without aura and without status migrainosus, not intractable 06/20/2014  . possible MS 02/24/2014  . Hypothyroidism 02/24/2014  . GERD (gastroesophageal reflux disease) 02/24/2014    Past Surgical History:  Procedure Laterality Date  .  ABDOMINAL HYSTERECTOMY    . CESAREAN SECTION      OB History    No data available       Home Medications    Prior to Admission medications   Medication Sig Start Date End Date Taking? Authorizing Provider  amantadine (SYMMETREL) 100 MG capsule TAKE 1 CAPSULE(100 MG) BY MOUTH THREE TIMES DAILY 01/25/17   Sater, Nanine Means, MD  aspirin (ASPIRIN EC) 81 MG EC tablet Take 81 mg by mouth daily. Swallow whole.    [provider]  cyclobenzaprine (FLEXERIL) 5 MG tablet  12/22/15   [provider]  FLUOXETINE HCL PO Take 30 mg by mouth daily.    [provider]  FREESTYLE LITE test strip  02/07/14   [provider]  gabapentin (NEURONTIN) 300 MG capsule TAKE ONE CAPSULE BY MOUTH EVERY EVENING AND 2 CAPSULES EVERY NIGHT AT BEDTIME 09/23/16   Sater, Nanine Means, MD  Lancets (FREESTYLE) lancets  02/05/14   [provider]  levothyroxine (SYNTHROID, LEVOTHROID) 175 MCG tablet Take 175 mcg by mouth daily.  01/26/14   [provider]  meloxicam (MOBIC) 15 MG tablet  12/22/15   [provider]  Multiple Vitamins-Minerals (CENTRUM ADULTS PO) Take 1 tablet by mouth daily. Chewable tablet    [provider]  omeprazole (PRILOSEC) 40 MG capsule Take 1 capsule (40 mg total) by mouth 2 (two) times daily before a meal. 02/10/15   Barton Dubois, MD  oxybutynin (DITROPAN) 5 MG tablet Take 5  mg by mouth daily.     [provider]  phentermine 37.5 MG capsule Take 1 capsule (37.5 mg total) by mouth every morning. 12/24/15   Sater, Nanine Means, MD  rOPINIRole (REQUIP) 3 MG tablet TAKE 1 TABLET BY MOUTH DAILY AT [facility] AND 1 TABLET AT BEDTIME 11/16/16   Sater, Nanine Means, MD  simvastatin (ZOCOR) 10 MG tablet Take 10 mg by mouth daily.    [provider]  SUMAtriptan (IMITREX) 100 MG tablet Take 1 tablet (100 mg total) by mouth once as needed for migraine. May repeat in 2 hours if headache persists or recurs. 12/24/15   Sater, Nanine Means, MD    tiZANidine (ZANAFLEX) 4 MG tablet Take 4 mg by mouth every 6 (six) hours as needed for muscle spasms.    [provider]  Zinc 50 MG TABS Take 1 tablet by mouth daily.    [provider]    Family History Family History  Problem Relation Age of Onset  . Barrett's esophagus Mother   . Graves' disease Sister   . Rheum arthritis Sister   . Coronary artery disease Neg Hx     Social History Social History   Tobacco Use  . Smoking status: Former Smoker    Last attempt to quit: 01/25/1984    Years since quitting: 33.0  . Smokeless tobacco: Never Used  Substance Use Topics  . Alcohol use: No  . Drug use: No     Allergies   Adderall [amphetamine-dextroamphetamine]   Review of Systems Review of Systems  Eyes: Positive for pain and visual disturbance. Negative for photophobia, discharge, redness and itching.  All other systems reviewed and are negative.    Physical Exam Updated Vital Signs BP 121/71   Pulse 84   Temp 98.2 F (36.8 C) (Oral)   Resp 16   SpO2 98%   Physical Exam  Constitutional: She is oriented to person, place, and time. She appears well-developed and well-nourished. No distress.  HENT:  Head: Normocephalic and atraumatic.  No tenderness to temporal region.  Eyes: Conjunctivae, EOM and lids are normal. Pupils are equal, round, and reactive to light.  No nystagmus. IOP of 15 in right eye. 13 in left. No pain with EOM. No consensual photophobia. Visual acuity 20/40 in right and left eyes.  Cardiovascular: Normal rate, regular rhythm and normal heart sounds.  No murmur heard. Pulmonary/Chest: Effort normal and breath sounds normal. No respiratory distress.  Abdominal: Soft. She exhibits no distension. There is no tenderness.  Neurological: She is alert and oriented to person, place, and time.  Skin: Skin is warm and dry.  Nursing note and vitals reviewed.    ED Treatments / Results  Labs (all labs ordered are listed, but only  abnormal results are displayed) Labs Reviewed  COMPREHENSIVE METABOLIC PANEL  CBC    EKG  EKG Interpretation None       Radiology No results found.  Procedures Procedures (including critical care time)  Medications Ordered in ED Medications  tetracaine (PONTOCAINE) 0.5 % ophthalmic solution 2 drop (2 drops Right Eye Given by Other 01/29/17 2221)     Initial Impression / Assessment and Plan / ED Course  I have reviewed the triage vital signs and the nursing notes.  Pertinent labs & imaging results that were available during my care of the patient were reviewed by me and considered in my medical decision making (see chart for details).    THANA RAMP is a 62 y.o. female  who presents to ED for intermittent right eye pain for the last week or two which feels similar to when she was diagnosed with optic neuritis in 1998.   Per most recent neurology office visit in November 2017: "She had right optic neuritis in 1998. LP at that time showed one oligoclonal band.  MRI brain and C-spine did not show additional plaques. A recent ophthalmological exam was consistent with a remote right optic neuritis.  She brought in other paperwork and repeat LP in 2002 was normal (no OCB and IgG ndex = 0.6) .   I stopped her Avonex in 2016 (she was on > 10 years) and she has had no exacerbation or other symptoms since stopping.  .  A repeat MRI on 04/21/2014  that I personally reviewed was essentially normal.     She continues to note mild blurriness out of that eye and also has photosensitivity."  On exam today, patient is afebrile, hemodynamically stable with 20/40 vision in both the left and right eyes. No tenderness to temporal region. No pain with EOM. IOP normal.   Last MRI was in March 2016 as noted above which was normal however she was not having symptoms at that time. Will obtain MR brain and orbits.   MRI pending at shift change. Care assumed by oncoming provider PA [facility]. Case  discussed, plan agreed upon. Will follow up on pending MRI's. If negative, will discharge to home with ophthalmology and neurology follow up.    Patient discussed with Dr. Leonette Monarch who agrees with treatment plan.   Final Clinical Impressions(s) / ED Diagnoses   Final diagnoses:  None    ED Discharge Orders    None       Ward, Ozella Almond, PA-C 01/30/17 0042    Fatima Blank, MD 02/02/17 [phone]

## 2017-01-29 NOTE — Telephone Encounter (Signed)
Pt called re: acute eye pain and blurry vision, facial numbness. Has appt with Dr. Felecia Shelling next month. I advised pt to proceed to ER d/t concern for acute exacerbation. She has Hx of ON.  She demonstrated understanding and agreement.  Per chart review, patient has not been seen in over a year. She does have an appt on 03/13/17.

## 2017-01-30 ENCOUNTER — Emergency Department (HOSPITAL_COMMUNITY): Payer: PPO

## 2017-01-30 DIAGNOSIS — H5711 Ocular pain, right eye: Secondary | ICD-10-CM | POA: Diagnosis not present

## 2017-01-30 MED ORDER — GADOBENATE DIMEGLUMINE 529 MG/ML IV SOLN
20.0000 mL | Freq: Once | INTRAVENOUS | Status: AC | PRN
  Administered 2017-01-30: 20 mL via INTRAVENOUS

## 2017-01-30 NOTE — ED Provider Notes (Signed)
Patient signed out to me pending MRI to rule out optic neuritis for eye complaints.  MRI shows no evidence of acute optic neuritis and not enough evidence to diagnose demyelinating diseases.  Recommend ophthalmology and neurology follow-up.  Results discussed with the patient, who understands and agrees with the plan.   Montine Circle, PA-C 01/30/17 8850    Fatima Blank, MD 02/02/17 [phone]

## 2017-01-30 NOTE — Telephone Encounter (Signed)
Spoke with Anna Walker this morning.  She was seen at [facility] ER.  MRI brain did not show optic neuritis. She c/o intermittent right eye pain, blurry vision. Increased fatigue. Right sided facial drooping, better now, and muscle twitches around right eye.  Has an appt. with ophthalmology next month but will call to see if that appt. can be moved up.  Will pass update along to RAS, so he can compare MRI from yesterday to the one she had in 2016, then will call pt. back with result and any additional action RAS would like to take./fim

## 2017-01-30 NOTE — ED Notes (Signed)
Patient transported to MRI 

## 2017-01-30 NOTE — Discharge Instructions (Addendum)
Your MRI showed evidence of prior optic neuritis, but no active flare.  It also showed chronic microvascular ischemic changes or sequelae of demyelination, but not enough evidence to rule-in demyelinating disease such as MS.  It is advised that you follow-up with you neurologist and ophthalmologist and discuss these results.   Return for new or worsening symptoms.

## 2017-01-31 NOTE — Telephone Encounter (Signed)
Pt is wanting to speak with RN about what she should do next, please call to discuss

## 2017-01-31 NOTE — Telephone Encounter (Signed)
Spoke with Benjamine Mola.  Will check with RAS to see if he's been able to look at her MRI/fim

## 2017-01-31 NOTE — Telephone Encounter (Signed)
Spoke with Benjamine Mola and per RAS, advised MRI showed no new lesions; therefore she should f/u with ophthalmology regarding visual disturbance, as it is not related to MS. Numbness, right arm sx, fatigue likely not related to MS either.  She verbalized understanding of same/fim

## 2017-02-03 ENCOUNTER — Encounter: Payer: Self-pay | Admitting: Neurology

## 2017-02-06 ENCOUNTER — Other Ambulatory Visit: Payer: Self-pay | Admitting: Neurology

## 2017-02-14 DIAGNOSIS — Z7984 Long term (current) use of oral hypoglycemic drugs: Secondary | ICD-10-CM | POA: Diagnosis not present

## 2017-02-14 DIAGNOSIS — H25043 Posterior subcapsular polar age-related cataract, bilateral: Secondary | ICD-10-CM | POA: Diagnosis not present

## 2017-02-14 DIAGNOSIS — E119 Type 2 diabetes mellitus without complications: Secondary | ICD-10-CM | POA: Diagnosis not present

## 2017-02-14 DIAGNOSIS — H2513 Age-related nuclear cataract, bilateral: Secondary | ICD-10-CM | POA: Diagnosis not present

## 2017-02-20 DIAGNOSIS — H524 Presbyopia: Secondary | ICD-10-CM | POA: Diagnosis not present

## 2017-02-20 DIAGNOSIS — H5213 Myopia, bilateral: Secondary | ICD-10-CM | POA: Diagnosis not present

## 2017-03-13 ENCOUNTER — Ambulatory Visit: Payer: PPO | Admitting: Neurology

## 2017-03-13 ENCOUNTER — Encounter: Payer: Self-pay | Admitting: Neurology

## 2017-03-13 VITALS — BP 124/79 | HR 73 | Resp 20 | Ht 66.0 in | Wt 277.5 lb

## 2017-03-13 DIAGNOSIS — G2581 Restless legs syndrome: Secondary | ICD-10-CM

## 2017-03-13 DIAGNOSIS — Z8669 Personal history of other diseases of the nervous system and sense organs: Secondary | ICD-10-CM | POA: Diagnosis not present

## 2017-03-13 DIAGNOSIS — R5383 Other fatigue: Secondary | ICD-10-CM | POA: Diagnosis not present

## 2017-03-13 DIAGNOSIS — G4733 Obstructive sleep apnea (adult) (pediatric): Secondary | ICD-10-CM

## 2017-03-13 DIAGNOSIS — Z9989 Dependence on other enabling machines and devices: Secondary | ICD-10-CM

## 2017-03-13 DIAGNOSIS — G43009 Migraine without aura, not intractable, without status migrainosus: Secondary | ICD-10-CM | POA: Diagnosis not present

## 2017-03-13 MED ORDER — AMANTADINE HCL 100 MG PO CAPS: ORAL_CAPSULE | ORAL | 3 refills | Status: DC

## 2017-03-13 MED ORDER — GABAPENTIN 300 MG PO CAPS: ORAL_CAPSULE | ORAL | 4 refills | Status: DC

## 2017-03-13 MED ORDER — ROPINIROLE HCL 3 MG PO TABS: ORAL_TABLET | ORAL | 3 refills | Status: DC

## 2017-03-13 NOTE — Progress Notes (Signed)
[facility]  PATIENT: Anna Walker DOB: [birth year]  REFERRING CLINICIAN:Elaine Laurann Montana  HISTORY FROM: Patient REASON FOR VISIT: H/O optic neuritis and possible MS   HISTORICAL  CHIEF COMPLAINT:  Chief Complaint  Patient presents with  . Sleep Apnea    Sts. is compliant with CPAP.  Having more trouble going to sleep.  "I can't shut my brain off."  Seen in the ER 01/29/17 for right eye pain, right sided facial numbness.  MRI brain, orbits done.  No active optic neuritis, and she f/u with opthalmologist (Dr. Katy Fitch) and sts. was told everything looks good.  Continue to have difficulty with knees, but sees ortho for this/fim  . Migraines  . ADD  . History of Optic Neuritis    HISTORY OF PRESENT ILLNESS:  Anna Walker is a 61 y.o.dwoman with OSA, h/o right optic neuritis and migraines  Update 03/13/2017: She uses her CPAP nightly for the entire night.   She sometimes has trouble falling asleep with a lot of thoughts going through her mind and leg cramps.     She takes gabapentin an Requip for her RLS with benefit.  She takes tramadol if she has more pain some nights (recent knee surgery on the left - Dr. Berenice Primas)  She had optic neuritis in 1998 and has had some times she feels it flares up and has taken IV or po steroids,   MRI shows a couple small T2/FLAIR hyperintense foci but no definite changes of note, she was diagnosed with MS after her episode of optic neuritis and was actually on treatment for multiple years. I stopped her disease modifying therapy in 2016 as possibility of MS was not high..   She has a lot of fatigue.   Amantadine helps her fatigue.   Phentermine caused BP increase  From 12/24/2015: OSA:   She has obstructive sleep apnea and has been on CPAP since 2000. She reports being compliant with CPAP usage.   She was followed by Dr. Brett Fairy in 2009 and got a newer machine at that time. Weight is similar now as in 2009.  Her machine continues to  work well.  No leaks.   She has not had a new mask x 1 year.  She had noted only mild sleepiness and only rarely dozes off watching TV or other activities.    Insomnia/RLS/PLMS:  She has insomnia due to RLS and muscle cramps (thighs).   Her restless leg syndrome/periodic limb movements of sleep does better on gabapentin (300 in evening and 600 at bedtime) and ropinirole (3 mg) combination. She tolerated tizanidine better than the cyclobenzaprine but cyclobenzaprine was better for her neck muscle aches (has C5C6 DJD).   She notes RLS anytime she is still, esp in evening and night    Migraine Headache:   She is doing better and has had only a couple headaches since last visit/   Imitrex helps when she has one.     Headaches are usually right frontal when they occur.   Fatigue/ADD:   She has Fatigue and attention deficit.    Phentermine monotherapy is helping her a fair amount but not as much as  Adderall.     Adderall was not covered by insurance.   She has Neuropsych testing form 2002 while in [place].   She was found to have ADD by Dr. Sheran Lawless and she recommended that she try stimulants.   She still notes some mental fog and decreased attention and STM with word finding  issues.   Phentermine helps this as well (though Adderall helped more)  ON/Concern about MS:   She had right optic neuritis in 1998. LP at that time showed one oligoclonal band.  MRI brain and C-spine did not show additional plaques.    A recent ophthalmological exam was consistent with a remote right optic neuritis.   She brought in other paperwork and repeat LP in 2002 was normal (no OCB and IgG ndex = 0.6) .   I stopped her Avonex in 2016 (she was on > 10 years) and she has had no exacerbation or other symptoms since stopping.  .  A repeat MRI on 04/21/2014  that I personally reviewed was essentially normal.     She continues to note mild blurriness out of that eye and also has photosensitivity.   REVIEW OF SYSTEMS:    Constitutional: No fevers, chills, sweats, or change in appetite.   Sleep issues as above Eyes: see above.  She is  light sensitive with mild eye pain at times Ear, nose and throat: No hearing loss, ear pain, nasal congestion, sore throat Cardiovascular: No chest pain, palpitations Respiratory:  Occ episodes of shortness of breath.  No wheezes GastrointestinaI: No nausea, vomiting, diarrhea, abdominal pain, fecal incontinence.   Mild dysphagia at times.  Genitourinary:  No dysuria, urinary retention or frequency.  No nocturia. Musculoskeletal:  Notes neck pain, back pain with some stiffness.   Integumentary: No rash, pruritus, skin lesions Neurological: as above Psychiatric: No depression at this time.  No anxiety Endocrine: No palpitations,  change in appetite, change in weigh or increased thirst.  Some heat intolerance.   Hematologic/Lymphatic:  No anemia, purpura, petechiae.   She sometimes bruises easily.   Allergic/Immunologic: No itchy/runny eyes, nasal congestion, recent allergic reactions, rashes  ALLERGIES: Allergies  Allergen Reactions  . Adderall [Amphetamine-Dextroamphetamine] Other (See Comments)    Caused elevated BP    HOME MEDICATIONS: Outpatient Medications Prior to Visit  Medication Sig Dispense Refill  . aspirin (ASPIRIN EC) 81 MG EC tablet Take 81 mg by mouth daily. Swallow whole.    Marland Kitchen FLUoxetine (PROZAC) 20 MG tablet Take 30 mg by mouth daily.    . hydrochlorothiazide (HYDRODIURIL) 12.5 MG tablet     . levothyroxine (SYNTHROID, LEVOTHROID) 150 MCG tablet Take 150 mcg by mouth daily before breakfast.    . omeprazole (PRILOSEC) 40 MG capsule Take 1 capsule (40 mg total) by mouth 2 (two) times daily before a meal. 60 capsule 1  . oxybutynin (DITROPAN) 5 MG tablet Take 5 mg by mouth daily.     . ranitidine (ZANTAC) 150 MG tablet Take 150 mg by mouth 2 (two) times daily.    . simvastatin (ZOCOR) 10 MG tablet Take 10 mg by mouth daily.    . SUMAtriptan (IMITREX) 100  MG tablet Take 1 tablet (100 mg total) by mouth once as needed for migraine. May repeat in 2 hours if headache persists or recurs. 10 tablet 5  . amantadine (SYMMETREL) 100 MG capsule TAKE 1 CAPSULE(100 MG) BY MOUTH THREE TIMES DAILY (Patient taking differently: Take 200 mg by mouth every morning. TAKE 1 CAPSULE(100 MG) BY MOUTH THREE TIMES DAILY) 90 capsule 1  . gabapentin (NEURONTIN) 300 MG capsule TAKE ONE CAPSULE BY MOUTH EVERY EVENING AND 2 CAPSULES EVERY NIGHT AT BEDTIME 90 capsule 0  . hydrochlorothiazide (HYDRODIURIL) 25 MG tablet Take 12.5 mg by mouth daily.    Marland Kitchen rOPINIRole (REQUIP) 3 MG tablet TAKE 1 TABLET BY MOUTH DAILY  AT [facility] AND 1 TABLET AT BEDTIME (Patient taking differently: Take one tablet at bedtime) 180 tablet 2  . phentermine 37.5 MG capsule Take 1 capsule (37.5 mg total) by mouth every morning. 30 capsule 5  . tiZANidine (ZANAFLEX) 4 MG tablet Take 4 mg by mouth every 6 (six) hours as needed for muscle spasms.     No facility-administered medications prior to visit.     PAST MEDICAL HISTORY: Past Medical History:  Diagnosis Date  . Diabetes mellitus without complication ([place])   . GERD (gastroesophageal reflux disease)   . Multiple sclerosis ([place])   . OSA (obstructive sleep apnea)   . Restless leg syndrome   . Thyroid disease    Hypothyroidism    PAST SURGICAL HISTORY: Past Surgical History:  Procedure Laterality Date  . ABDOMINAL HYSTERECTOMY    . CESAREAN SECTION      FAMILY HISTORY: No FH of MS.  Her sister and aunt have RA.  Sister has hepatitis from RA treatment  SOCIAL HISTORY:  Social History   Socioeconomic History  . Marital status: Married    Spouse name: Not on file  . Number of children: Not on file  . Years of education: Not on file  . Highest education level: Not on file  Social Needs  . Financial resource strain: Not on file  . Food insecurity - worry: Not on file  . Food insecurity - inability: Not on file  . Transportation needs -  medical: Not on file  . Transportation needs - non-medical: Not on file  Occupational History  . Not on file  Tobacco Use  . Smoking status: Former Smoker    Last attempt to quit: 01/25/1984    Years since quitting: 33.1  . Smokeless tobacco: Never Used  Substance and Sexual Activity  . Alcohol use: No  . Drug use: No  . Sexual activity: Yes    Birth control/protection: Surgical  Other Topics Concern  . Not on file  Social History Narrative  . Not on file     PHYSICAL EXAM  Vitals:   03/13/17 1057  BP: 124/79  Pulse: 73  Resp: 20  Weight: 277 lb 8 oz (125.9 kg)  Height: 5\' 6"  (1.676 m)    Body mass index is 44.79 kg/m.   General: The patient is well-developed and well-nourished and in no acute distress.   Pharynx is crowded (Mallampati 3).    Stye OD.     There is mild optic atrophy on the right.  Neurologic Exam  Mental status: The patient is alert and oriented x 3 at the time of the examination.  Speech is normal.  Cranial nerves: Extraocular movements are full.  2+ Right APD.   Reduced color vision out of OD   patient's strength is normal. Trapezius strength is normal. No obvious hearing deficits are noted.  Motor:  Muscle bulk and tone are normal. Is 5/5 in all 4 limbs.   Sensory: She reports mildly decreased sensation to touch and vibration in the left arm compared to the right arm. She has more symmetric sensation to touch and vibration and temperature in the legs.   Coordination: Cerebellar testing reveals good finger-nose-finger.  Gait and station: Station is normal and gait is arthritic. She has difficulty doing a tandem walk..   Reflexes: Deep tendon reflexes are symmetric and normal bilaterally.      DIAGNOSTIC DATA (LABS, IMAGING, TESTING) - I reviewed patient records, labs, notes, testing and imaging myself where available.  Lab Results  Component Value Date   WBC 6.5 01/29/2017   HGB 12.3 01/29/2017   HCT 39.2 01/29/2017   MCV 84.1  01/29/2017   PLT 287 01/29/2017      Component Value Date/Time   NA 137 01/29/2017 1559   K 3.7 01/29/2017 1559   CL 103 01/29/2017 1559   CO2 28 01/29/2017 1559   GLUCOSE 97 01/29/2017 1559   BUN 10 01/29/2017 1559   CREATININE 0.85 01/29/2017 1559   CALCIUM 9.0 01/29/2017 1559   PROT 7.0 01/29/2017 1559   ALBUMIN 3.8 01/29/2017 1559   AST 21 01/29/2017 1559   ALT 23 01/29/2017 1559   ALKPHOS 81 01/29/2017 1559   BILITOT 0.9 01/29/2017 1559   GFRNONAA >60 01/29/2017 1559   GFRAA >60 01/29/2017 1559        ASSESSMENT AND PLAN  OSA on CPAP  Migraine without aura and without status migrainosus, not intractable  History of optic neuritis  Restless leg syndrome  Other fatigue    1.   She will continue CPAP for her OSA and we will be able to write to refill supplies as needed   2.    Continue amantadine, renew 3.   Cointinue sumatriptan for migraine 4.   Continue gabapentin an Requip for the restless leg syndrome 5.   Try to remain active and exercise as tolerated.   Return to see me in 12 months or sooner if problems.      Severiano Utsey A. Felecia Shelling, MD, PhD 4/92/0100, 7:12 PM Certified in Neurology, Clinical Neurophysiology, Sleep Medicine, Pain Medicine and Neuroimaging  [facility] [address] [place], [place] [address] [phone] d1

## 2017-03-14 DIAGNOSIS — M1711 Unilateral primary osteoarthritis, right knee: Secondary | ICD-10-CM | POA: Diagnosis not present

## 2017-03-14 DIAGNOSIS — M1712 Unilateral primary osteoarthritis, left knee: Secondary | ICD-10-CM | POA: Diagnosis not present

## 2017-04-10 ENCOUNTER — Other Ambulatory Visit: Payer: Self-pay | Admitting: Neurology

## 2017-04-11 ENCOUNTER — Other Ambulatory Visit: Payer: Self-pay | Admitting: *Deleted

## 2017-04-25 DIAGNOSIS — M170 Bilateral primary osteoarthritis of knee: Secondary | ICD-10-CM | POA: Diagnosis not present

## 2017-04-25 DIAGNOSIS — S60211A Contusion of right wrist, initial encounter: Secondary | ICD-10-CM | POA: Diagnosis not present

## 2017-04-25 DIAGNOSIS — M25531 Pain in right wrist: Secondary | ICD-10-CM | POA: Diagnosis not present

## 2017-04-25 DIAGNOSIS — G8929 Other chronic pain: Secondary | ICD-10-CM | POA: Diagnosis not present

## 2017-04-27 ENCOUNTER — Other Ambulatory Visit (HOSPITAL_COMMUNITY): Payer: Self-pay | Admitting: Family Medicine

## 2017-04-27 DIAGNOSIS — M79606 Pain in leg, unspecified: Secondary | ICD-10-CM

## 2017-04-27 DIAGNOSIS — I82402 Acute embolism and thrombosis of unspecified deep veins of left lower extremity: Secondary | ICD-10-CM | POA: Diagnosis not present

## 2017-04-27 DIAGNOSIS — K219 Gastro-esophageal reflux disease without esophagitis: Secondary | ICD-10-CM | POA: Diagnosis not present

## 2017-04-28 ENCOUNTER — Ambulatory Visit (HOSPITAL_COMMUNITY): Payer: PPO

## 2017-04-28 ENCOUNTER — Encounter (HOSPITAL_COMMUNITY): Payer: Self-pay

## 2017-04-28 ENCOUNTER — Ambulatory Visit (HOSPITAL_COMMUNITY)
Admission: RE | Admit: 2017-04-28 | Discharge: 2017-04-28 | Disposition: A | Discharge status: Home / Self Care | Payer: PPO | Source: Ambulatory Visit | Attending: Family Medicine | Admitting: Family Medicine

## 2017-04-28 DIAGNOSIS — M79606 Pain in leg, unspecified: Secondary | ICD-10-CM | POA: Diagnosis not present

## 2017-04-28 NOTE — Progress Notes (Signed)
*  Preliminary Results* Bilateral lower extremity venous duplex completed. Bilateral lower extremities are negative for deep vein thrombosis. There is no evidence of Baker's cyst bilaterally.  04/28/2017 3:19 PM Maudry Mayhew, BS, RVT, RDCS, RDMS

## 2017-05-19 ENCOUNTER — Encounter: Payer: Self-pay | Admitting: Neurology

## 2017-05-19 ENCOUNTER — Encounter

## 2017-05-19 ENCOUNTER — Ambulatory Visit: Payer: PPO | Admitting: Neurology

## 2017-05-19 VITALS — BP 126/74 | HR 73 | Ht 66.0 in | Wt 272.0 lb

## 2017-05-19 DIAGNOSIS — G4733 Obstructive sleep apnea (adult) (pediatric): Secondary | ICD-10-CM | POA: Diagnosis not present

## 2017-05-19 DIAGNOSIS — G43009 Migraine without aura, not intractable, without status migrainosus: Secondary | ICD-10-CM | POA: Diagnosis not present

## 2017-05-19 DIAGNOSIS — G2581 Restless legs syndrome: Secondary | ICD-10-CM | POA: Diagnosis not present

## 2017-05-19 DIAGNOSIS — Z9989 Dependence on other enabling machines and devices: Secondary | ICD-10-CM | POA: Diagnosis not present

## 2017-05-19 DIAGNOSIS — Z8669 Personal history of other diseases of the nervous system and sense organs: Secondary | ICD-10-CM

## 2017-05-19 NOTE — Progress Notes (Addendum)
NEUROLOGY CONSULTATION NOTE  Anna Walker MRN: [id number] DOB: 12/27/1955  Referring provider: Dr. Laurann Montana Primary care provider: Dr. Laurann Montana  Reason for consult:  History of optic neuritis with recurrent symptoms  HISTORY OF PRESENT ILLNESS: Anna Walker is a 62 year old originally left-handed female with fatigue, ADD, hypertension, RLS, hyperlipidemia, type 2 diabetes mellitus, OSA, hypothyroidism and history of right optic neuritis who presents with questionable MS and migraines.  History supplemented by prior neurologist's notes.  I RIGHT OPTIC NEURITIS: She had an episode of right optic neuritis in 1998, presenting first as blurred vision and then complete vision loss.   A couple of weeks later, she developed right sided numbness of the face, arm and leg.  She underwent a lumbar puncture which demonstrated one oligoclonal band.  MRI of brain reportedly may have shown foci which may be concerning for MS.  MRI cervical spine reportedly did not demonstrate demyelinating lesions.  She was diagnosed with multiple sclerosis and treated with IV steroids.  The vision loss lasted 7-8 weeks.  The numbness lasted several days.  She was treated with DMT for several years.  She started on Betaseron but was then switched to Copaxone and then Avonex due to intolerability.  She had a repeat LP in 2002 which showed no oligoclonal bands and normal IgG index of 0.6.  Visual evoked potential in 2013 showed absent right response and normal left response.  Repeat MRI from July 2014 was thought to be essentially normal so Avonex was discontinued.  Repeat MRI in November 2014 was unchanged.  Therefore, it was believed that she did not have MS.  NMO-IgG antibodies from 02/24/14 were negative.  MRI of cervical spine without contrast from 03/24/16 was personally reviewed and revealed cervical spondylosis but no abnormal cord signal.  Since the episode in 1998, she endorses subtle weakness on the right side of  her body.  For example, when she starts to write, her handwriting will start to get worse.  She also reports short-term memory deficits since 1998.  Over the years, she has had recurrent episodes of right eye pain with right sided numbness.  In the past, they were thought to be MS flares which were treated with steroids.  They occur when she is heated or fatigued.  They typically occur at least once a year and they last about 2 days.  She last had an episode in early January 2019, for which she was evaluated in the ED on 01/30/17.  She had an MRI of the brain and orbits with and without contrast, which was personally reviewed and demonstrated asymmetrically small right optic nerve compatible with sequelae of prior optic neuritis but no enhancement, as well as three nonspecific tiny hyperintense T2/FLAIR foci in the bifrontal periventricular white matter.  All of her symptoms have not progressed over the years and recurrence of these episodes have not increased since discontinuation of Avonex.  She has history of migraines since early adulthood.  They are severe bifrontal pain associated with nausea, photophobia and phonophobia but no vomiting, new visual disturbance or unilateral numbness or weakness.  They last 1 to 2 days and usually occur once a year.  She has pain in the legs and gait issues.  She does have arthritis in the knees, which required surgery and had a subsequent DVT.  She is treated for restless leg syndrome, for which she takes ropinirole and gabapentin.  She also reports chronic fatigue.  She has OSA and is on CPAP.  She takes amantadine.  Current NSAIDS:  ASA 81mg  daily Current analgesics:  tramadol Current triptans:  sumatriptan 100mg  Current anti-emetic:  no Current muscle relaxants:  no Current anti-anxiolytic:  no Current sleep aide:  no Current Antihypertensive medications:  HCTZ Current Antidepressant medications:  fluoxetine 30mg  Current Anticonvulsant medications:   gabapentin 300mg  evening and 600mg  at bedtime Other medications:  Ropinirole 3mg  dinner and 3mg  bedtime, amantadine 100mg  three times daily  01/29/17 LABS:  CBC with WBC 6.5, HGB 12.3, HCT 39.2, PLT 287; CMP with Na 137, K 3.7, Cl 103, CO2 28, glucose 97, BUN 10, Cr 0.85, t bili 0.9, ALP 81, AST 21 and ALT 23.  PAST MEDICAL HISTORY: Past Medical History:  Diagnosis Date  . Diabetes mellitus without complication ([place])   . GERD (gastroesophageal reflux disease)   . Multiple sclerosis ([place])   . OSA (obstructive sleep apnea)   . Restless leg syndrome   . Thyroid disease    Hypothyroidism    PAST SURGICAL HISTORY: Past Surgical History:  Procedure Laterality Date  . ABDOMINAL HYSTERECTOMY    . CESAREAN SECTION      MEDICATIONS: Current Outpatient Medications on File Prior to Visit  Medication Sig Dispense Refill  . amantadine (SYMMETREL) 100 MG capsule TAKE 1 CAPSULE(100 MG) BY MOUTH THREE TIMES DAILY 270 capsule 3  . amantadine (SYMMETREL) 100 MG capsule TAKE 1 CAPSULE(100 MG) BY MOUTH THREE TIMES DAILY 270 capsule 3  . aspirin (ASPIRIN EC) 81 MG EC tablet Take 81 mg by mouth daily. Swallow whole.    Marland Kitchen FLUoxetine (PROZAC) 10 MG capsule Take 10 mg by mouth daily.    Marland Kitchen FLUoxetine (PROZAC) 20 MG tablet Take 30 mg by mouth daily.    Marland Kitchen gabapentin (NEURONTIN) 300 MG capsule Take up to three pills a day in the evening and bedtime 270 capsule 4  . hydrochlorothiazide (HYDRODIURIL) 12.5 MG tablet     . levothyroxine (SYNTHROID, LEVOTHROID) 150 MCG tablet Take 150 mcg by mouth daily before breakfast.    . omeprazole (PRILOSEC) 40 MG capsule Take 1 capsule (40 mg total) by mouth 2 (two) times daily before a meal. 60 capsule 1  . oxybutynin (DITROPAN) 5 MG tablet Take 5 mg by mouth daily.     . ranitidine (ZANTAC) 150 MG tablet Take 150 mg by mouth 2 (two) times daily.    Marland Kitchen rOPINIRole (REQUIP) 3 MG tablet TAKE 1 TABLET BY MOUTH DAILY AT DINNER AND 1 TABLET AT BEDTIME 180 tablet 3  . simvastatin  (ZOCOR) 10 MG tablet Take 10 mg by mouth daily.    . SUMAtriptan (IMITREX) 100 MG tablet Take 1 tablet (100 mg total) by mouth once as needed for migraine. May repeat in 2 hours if headache persists or recurs. 10 tablet 5   No current facility-administered medications on file prior to visit.     ALLERGIES: Allergies  Allergen Reactions  . Adderall [Amphetamine-Dextroamphetamine] Other (See Comments)    Caused elevated BP    FAMILY HISTORY: Family History  Problem Relation Age of Onset  . Barrett's esophagus Mother   . Hypertension Mother   . Graves' disease Sister   . Rheum arthritis Sister   . Heart attack Father 28  . Coronary artery disease Neg Hx     SOCIAL HISTORY: Social History   Socioeconomic History  . Marital status: Married    Spouse name: Eddie Dibbles  . Number of children: 2  . Years of education: Not on file  . Highest education level:  Some college, no degree  Occupational History  . Occupation: disabled  Social Needs  . Financial resource strain: Not on file  . Food insecurity:    Worry: Not on file    Inability: Not on file  . Transportation needs:    Medical: Not on file    Non-medical: Not on file  Tobacco Use  . Smoking status: Former Smoker    Last attempt to quit: 01/25/1984    Years since quitting: 33.3  . Smokeless tobacco: Never Used  Substance and Sexual Activity  . Alcohol use: No  . Drug use: No  . Sexual activity: Yes    Birth control/protection: Surgical  Lifestyle  . Physical activity:    Days per week: Not on file    Minutes per session: Not on file  . Stress: Not on file  Relationships  . Social connections:    Talks on phone: Not on file    Gets together: Not on file    Attends religious service: Not on file    Active member of club or organization: Not on file    Attends meetings of clubs or organizations: Not on file    Relationship status: Not on file  . Intimate partner violence:    Fear of current or ex partner: Not on  file    Emotionally abused: Not on file    Physically abused: Not on file    Forced sexual activity: Not on file  Other Topics Concern  . Not on file  Social History Narrative   Pt is right handed. She is married, lives with her husband in a 1 story house, ramp to enter. She drinks 2 cups of coffee a day.    REVIEW OF SYSTEMS: Constitutional: fatigue Eyes: baseline blurred vision in right eye Ear, nose and throat: No hearing loss, ear pain, nasal congestion, sore throat Cardiovascular: No chest pain, palpitations Respiratory:  No shortness of breath at rest or with exertion, wheezes GastrointestinaI: occasional constipation Genitourinary:  urinary frequency/occasional incontinence,  Musculoskeletal:  Leg pain Integumentary: No rash, pruritus, skin lesions Neurological: as above Psychiatric: insomnia Endocrine: fatigue Hematologic/Lymphatic:  No purpura, petechiae. Allergic/Immunologic: no itchy/runny eyes, nasal congestion, recent allergic reactions, rashes  PHYSICAL EXAM: Vitals:   05/19/17 0758  BP: 126/74  Pulse: 73  SpO2: 95%   General: No acute distress.  Patient appears well-groomed.  Head:  Normocephalic/atraumatic Eyes:  fundi examined but not visualized Neck: supple, no paraspinal tenderness, full range of motion Back: No paraspinal tenderness Heart: regular rate and rhythm Lungs: Clear to auscultation bilaterally. Vascular: No carotid bruits. Neurological Exam: Mental status: alert and oriented to person, place, and time, recent and remote memory intact, fund of knowledge intact, attention and concentration intact, speech fluent and not dysarthric, language intact. Cranial nerves: CN I: not tested CN II: Right APD,  visual fields intact CN III, IV, VI:  full range of motion, no nystagmus, no ptosis CN V: facial sensation intact CN VII: upper and lower face symmetric CN VIII: hearing intact CN IX, X: gag intact, uvula midline CN XI: sternocleidomastoid and  trapezius muscles intact CN XII: tongue midline Bulk & Tone: normal, no fasciculations. Motor:  5-/5 bilateral deltoid and right triceps, which may be decreased effort.  Otherwise, 5/5 throughout  Sensation:  Pinprick and vibration sensation intact. Deep Tendon Reflexes:  2+ throughout, toes downgoing.  Finger to nose testing:  Without dysmetria.  Heel to shin:  Without dysmetria.  Gait:  Antalgic gait.  Able to  turn and tandem walk. Romberg negative.  IMPRESSION: 1.  History of right optic neuritis with right sided numbness and questionable history of multiple sclerosis.  At this time, I do not believe she has multiple sclerosis.  Other than evidence of right optic neuritis, there are no findings on MRI to suggest demyelinating disease.  She likely had a clinically isolated syndrome and her recurrent periodic flare-ups may be Uhthoff's phenomenon rather than recurrent active MS flares.  Complicated migraine is also possible. 2.  Migraine without aura, not intractable, stable 3.  Restless leg syndrome  PLAN: I wouldn't make any changes to her management at this time Follow up in 6 months.  Thank you for allowing me to take part in the care of this patient.  Anna Clines, DO  CC:  Kelton Pillar, MD

## 2017-05-19 NOTE — Patient Instructions (Signed)
At this point, I don't think you have MS.  You may have had a clinically isolated syndrome and your recurrent symptoms may be residual from 1998 that are aggravated by heat or fatigue (not new active flare ups) or they could be complicated migraines.  Continue current medications and we will continue to monitor Follow up in 6 months.

## 2017-06-21 ENCOUNTER — Encounter: Payer: Self-pay | Admitting: Neurology

## 2017-07-19 DIAGNOSIS — J069 Acute upper respiratory infection, unspecified: Secondary | ICD-10-CM | POA: Diagnosis not present

## 2017-08-15 DIAGNOSIS — I10 Essential (primary) hypertension: Secondary | ICD-10-CM | POA: Diagnosis not present

## 2017-08-15 DIAGNOSIS — E119 Type 2 diabetes mellitus without complications: Secondary | ICD-10-CM | POA: Diagnosis not present

## 2017-08-15 DIAGNOSIS — Z0000 Encounter for general adult medical examination without abnormal findings: Secondary | ICD-10-CM | POA: Diagnosis not present

## 2017-08-15 DIAGNOSIS — E039 Hypothyroidism, unspecified: Secondary | ICD-10-CM | POA: Diagnosis not present

## 2017-08-15 DIAGNOSIS — Z1159 Encounter for screening for other viral diseases: Secondary | ICD-10-CM | POA: Diagnosis not present

## 2017-08-15 DIAGNOSIS — E785 Hyperlipidemia, unspecified: Secondary | ICD-10-CM | POA: Diagnosis not present

## 2017-08-15 DIAGNOSIS — F39 Unspecified mood [affective] disorder: Secondary | ICD-10-CM | POA: Diagnosis not present

## 2017-08-15 DIAGNOSIS — Z1389 Encounter for screening for other disorder: Secondary | ICD-10-CM | POA: Diagnosis not present

## 2017-08-15 DIAGNOSIS — G35 Multiple sclerosis: Secondary | ICD-10-CM | POA: Diagnosis not present

## 2017-08-15 DIAGNOSIS — K219 Gastro-esophageal reflux disease without esophagitis: Secondary | ICD-10-CM | POA: Diagnosis not present

## 2017-08-15 DIAGNOSIS — I82402 Acute embolism and thrombosis of unspecified deep veins of left lower extremity: Secondary | ICD-10-CM | POA: Diagnosis not present

## 2017-08-15 DIAGNOSIS — M25569 Pain in unspecified knee: Secondary | ICD-10-CM | POA: Diagnosis not present

## 2017-09-28 ENCOUNTER — Ambulatory Visit: Payer: PPO | Admitting: Neurology

## 2017-09-28 ENCOUNTER — Encounter: Payer: Self-pay | Admitting: Neurology

## 2017-09-28 VITALS — BP 116/62 | HR 83 | Ht 65.0 in | Wt 276.0 lb

## 2017-09-28 DIAGNOSIS — G43119 Migraine with aura, intractable, without status migrainosus: Secondary | ICD-10-CM

## 2017-09-28 MED ORDER — KETOROLAC TROMETHAMINE 60 MG/2ML IM SOLN
60.0000 mg | Freq: Once | INTRAMUSCULAR | Status: AC
  Administered 2017-09-28: 60 mg via INTRAMUSCULAR

## 2017-09-28 NOTE — Patient Instructions (Signed)
1.  We will give you a toradol injection today. 2.  If headache persists tomorrow, call tomorrow morning and you can come in for a full headache cocktail (you will need a driver) or I can prescribe you a prednisone taper. 3.  Contact me next week with update. 4.  Follow up in 4 months.

## 2017-09-28 NOTE — Progress Notes (Signed)
NEUROLOGY FOLLOW UP OFFICE NOTE  NOELA BROTHERS [id number]  HISTORY OF PRESENT ILLNESS: Anna Walker is a 62 year old originally left-handed female with fatigue, ADD, hypertension, restless leg syndrome, migraines, hyperlipidemia, type 2 diabetes mellitus, OSA, hypothyroidism and history of right optic neuritis who follows up intractable migraine.  UPDATE: She reports headaches in right eye, severe sharp pain.  Constant but fluctuates since last Friday.  There is associated blurred vision in right eye, dizziness, nausea, photophobia and phonophobia, eye twitching and fatigue.  No associated right sided numbness and weakness.  She took the sumatriptan which has eased it but not broke it.     HISTORY: I RIGHT OPTIC NEURITIS AND OTHER RECURRENT SYMPTOMS WITH QUESTIONABLE MULTIPLE SCLEROSIS: She had an episode of right optic neuritis in 1998, presenting first as blurred vision and then complete vision loss.   A couple of weeks later, she developed right sided numbness of the face, arm and leg.  She underwent a lumbar puncture which demonstrated one oligoclonal band.  MRI of brain reportedly may have shown foci which may be concerning for MS.  MRI cervical spine reportedly did not demonstrate demyelinating lesions.  She was diagnosed with multiple sclerosis and treated with IV steroids.  The vision loss lasted 7-8 weeks.  The numbness lasted several days.  She was treated with DMT for several years.  She started on Betaseron but was then switched to Copaxone and then Avonex due to intolerability.  She had a repeat LP in 2002 which showed no oligoclonal bands and normal IgG index of 0.6.  Visual evoked potential in 2013 showed absent right response and normal left response.  Repeat MRI from July 2014 was thought to be essentially normal so Avonex was discontinued.  Repeat MRI in November 2014 was unchanged.  Therefore, it was believed that she did not have MS.  NMO-IgG antibodies from 02/24/14 were  negative.  MRI of cervical spine without contrast from 03/24/16 was personally reviewed and revealed cervical spondylosis but no abnormal cord signal.  Since the episode in 1998, she endorses subtle weakness on the right side of her body.  For example, when she starts to write, her handwriting will start to get worse.  She also reports short-term memory deficits since 1998.  Over the years, she has had recurrent episodes of right eye pain with right sided numbness.  In the past, they were thought to be MS flares which were treated with steroids.  They occur when she is heated or fatigued.  They typically occur at least once a year and they last about 2 days.  She last had an episode in early January 2019, for which she was evaluated in the ED on 01/30/17.  She had an MRI of the brain and orbits with and without contrast, which was personally reviewed and demonstrated asymmetrically small right optic nerve compatible with sequelae of prior optic neuritis but no enhancement, as well as three nonspecific tiny hyperintense T2/FLAIR foci in the bifrontal periventricular white matter.  All of her symptoms have not progressed over the years and recurrence of these episodes have not increased since discontinuation of Avonex.  She has pain in the legs and gait issues.  She does have arthritis in the knees, which required surgery and had a subsequent DVT.  She is treated for restless leg syndrome, for which she takes ropinirole and gabapentin.  She also reports chronic fatigue.  She has OSA and is on CPAP.  She takes amantadine.  II MIGRAINES She has history of migraines since early adulthood.  They are severe bifrontal pain associated with nausea, photophobia and phonophobia but no vomiting, new visual disturbance or unilateral numbness or weakness.  They last 1 to 2 days and usually occur once a year.  Current NSAIDS:  ASA 81mg  daily Current analgesics:  tramadol Current triptans:  sumatriptan 100mg  Current  anti-emetic:  no Current muscle relaxants:  no Current anti-anxiolytic:  no Current sleep aide:  no Current Antihypertensive medications:  HCTZ Current Antidepressant medications:  fluoxetine 30mg  Current Anticonvulsant medications:  gabapentin 300mg  evening and 600mg  at bedtime Other medications:  Ropinirole 3mg  dinner and 3mg  bedtime, amantadine 100mg  three times daily  PAST MEDICAL HISTORY: Past Medical History:  Diagnosis Date  . Diabetes mellitus without complication ([place])   . GERD (gastroesophageal reflux disease)   . Multiple sclerosis ([place])   . OSA (obstructive sleep apnea)   . Restless leg syndrome   . Thyroid disease    Hypothyroidism    MEDICATIONS: Current Outpatient Medications on File Prior to Visit  Medication Sig Dispense Refill  . amantadine (SYMMETREL) 100 MG capsule TAKE 1 CAPSULE(100 MG) BY MOUTH THREE TIMES DAILY 270 capsule 3  . amantadine (SYMMETREL) 100 MG capsule TAKE 1 CAPSULE(100 MG) BY MOUTH THREE TIMES DAILY 270 capsule 3  . aspirin (ASPIRIN EC) 81 MG EC tablet Take 81 mg by mouth daily. Swallow whole.    Marland Kitchen FLUoxetine (PROZAC) 10 MG capsule Take 10 mg by mouth daily.    Marland Kitchen FLUoxetine (PROZAC) 20 MG tablet Take 30 mg by mouth daily.    Marland Kitchen gabapentin (NEURONTIN) 300 MG capsule Take up to three pills a day in the evening and bedtime 270 capsule 4  . hydrochlorothiazide (HYDRODIURIL) 12.5 MG tablet     . levothyroxine (SYNTHROID, LEVOTHROID) 150 MCG tablet Take 150 mcg by mouth daily before breakfast.    . omeprazole (PRILOSEC) 40 MG capsule Take 1 capsule (40 mg total) by mouth 2 (two) times daily before a meal. 60 capsule 1  . oxybutynin (DITROPAN) 5 MG tablet Take 5 mg by mouth daily.     . ranitidine (ZANTAC) 150 MG tablet Take 150 mg by mouth 2 (two) times daily.    Marland Kitchen rOPINIRole (REQUIP) 3 MG tablet TAKE 1 TABLET BY MOUTH DAILY AT DINNER AND 1 TABLET AT BEDTIME 180 tablet 3  . simvastatin (ZOCOR) 10 MG tablet Take 10 mg by mouth daily.    . SUMAtriptan  (IMITREX) 100 MG tablet Take 1 tablet (100 mg total) by mouth once as needed for migraine. May repeat in 2 hours if headache persists or recurs. 10 tablet 5   No current facility-administered medications on file prior to visit.     ALLERGIES: Allergies  Allergen Reactions  . Adderall [Amphetamine-Dextroamphetamine] Other (See Comments)    Caused elevated BP    FAMILY HISTORY: Family History  Problem Relation Age of Onset  . Barrett's esophagus Mother   . Hypertension Mother   . Graves' disease Sister   . Rheum arthritis Sister   . Heart attack Father 9  . Coronary artery disease Neg Hx    SOCIAL HISTORY: Social History   Socioeconomic History  . Marital status: Married    Spouse name: Eddie Dibbles  . Number of children: 2  . Years of education: Not on file  . Highest education level: Some college, no degree  Occupational History  . Occupation: disabled  Social Needs  . Financial resource strain: Not on file  .  Food insecurity:    Worry: Not on file    Inability: Not on file  . Transportation needs:    Medical: Not on file    Non-medical: Not on file  Tobacco Use  . Smoking status: Former Smoker    Last attempt to quit: 01/25/1984    Years since quitting: 33.6  . Smokeless tobacco: Never Used  Substance and Sexual Activity  . Alcohol use: No  . Drug use: No  . Sexual activity: Yes    Birth control/protection: Surgical  Lifestyle  . Physical activity:    Days per week: Not on file    Minutes per session: Not on file  . Stress: Not on file  Relationships  . Social connections:    Talks on phone: Not on file    Gets together: Not on file    Attends religious service: Not on file    Active member of club or organization: Not on file    Attends meetings of clubs or organizations: Not on file    Relationship status: Not on file  . Intimate partner violence:    Fear of current or ex partner: Not on file    Emotionally abused: Not on file    Physically abused: Not on  file    Forced sexual activity: Not on file  Other Topics Concern  . Not on file  Social History Narrative   Pt is right handed. She is married, lives with her husband in a 1 story house, ramp to enter. She drinks 2 cups of coffee a day.    REVIEW OF SYSTEMS: Constitutional: No fevers, chills, or sweats, no generalized fatigue, change in appetite Eyes: No visual changes, double vision, eye pain Ear, nose and throat: No hearing loss, ear pain, nasal congestion, sore throat Cardiovascular: No chest pain, palpitations Respiratory:  No shortness of breath at rest or with exertion, wheezes GastrointestinaI: No nausea, vomiting, diarrhea, abdominal pain, fecal incontinence Genitourinary:  No dysuria, urinary retention or frequency Musculoskeletal:  No neck pain, back pain Integumentary: No rash, pruritus, skin lesions Neurological: as above Psychiatric: No depression, insomnia, anxiety Endocrine: No palpitations, fatigue, diaphoresis, mood swings, change in appetite, change in weight, increased thirst Hematologic/Lymphatic:  No purpura, petechiae. Allergic/Immunologic: no itchy/runny eyes, nasal congestion, recent allergic reactions, rashes  PHYSICAL EXAM: Blood pressure 116/62, pulse 83, height 5\' 5"  (1.651 m), weight 276 lb (125.2 kg), SpO2 98 %. General: No acute distress.  Patient appears well-groomed. Head:  Normocephalic/atraumatic Eyes:  Fundi examined but not visualized Neck: supple, no paraspinal tenderness, full range of motion Heart:  Regular rate and rhythm Lungs:  Clear to auscultation bilaterally Back: No paraspinal tenderness Neurological Exam: alert and oriented to person, place, and time. Attention span and concentration intact, recent and remote memory intact, fund of knowledge intact.  Speech fluent and not dysarthric, language intact.  CN II-XII intact. Bulk and tone normal, muscle strength 5/5 throughout.  Sensation to light touch, temperature and vibration intact.   Deep tendon reflexes 2+ throughout, toes downgoing.  Finger to nose and heel to shin testing intact.  Gait normal, Romberg negative.  IMPRESSION: 1.  Intractable migraine with aura, without status migrainosus 2.  Recurrent symptoms with right sided numbness and weakness, likely complicated migraine.  Based on testing that has been performed, I do not believe Ms. Stowell has multiple sclerosis.  CSF analysis revealed 1 oligoclonal band, which is not a significant number.  She has had repeated MRIs over the years with minimal (3 )  tiny hyperintense foci in the periventricular white matter which have been stable.  Given her longstanding history with recurrent flare-ups, I would expect radiographic evidence of disease progression on MRI.  She also does not exhibit abnormal findings in other regions of the CNS, such as supratentorial region or spinal cord.    PLAN: 1.  Treat with toradol 60mg  injection today to abort headache.  If still with headache tomorrow, she should contact us in morning and she can come in for a full headache cocktail if she has a driver.  Otherwise, we can prescribe her a prednisone taper. 2.  If headache persists into next week, would initiate preventative such as topiramate 50mg  at bedtime.   3.  Limit use of pain relievers to no more than 2 days out of week to prevent risk of rebound or medication-overuse headache. 4.  Follow up in 4 months.  Metta Clines, DO  CC: Kelton Pillar, MD

## 2017-09-29 ENCOUNTER — Telehealth: Payer: Self-pay | Admitting: Neurology

## 2017-09-29 ENCOUNTER — Encounter (HOSPITAL_COMMUNITY): Payer: Self-pay

## 2017-09-29 ENCOUNTER — Ambulatory Visit (HOSPITAL_COMMUNITY)
Admission: EM | Admit: 2017-09-29 | Discharge: 2017-09-29 | Disposition: A | Discharge status: Home / Self Care | Payer: PPO | Attending: Family Medicine | Admitting: Family Medicine

## 2017-09-29 DIAGNOSIS — G43019 Migraine without aura, intractable, without status migrainosus: Secondary | ICD-10-CM | POA: Diagnosis not present

## 2017-09-29 MED ORDER — DEXAMETHASONE SODIUM PHOSPHATE 10 MG/ML IJ SOLN
10.0000 mg | Freq: Once | INTRAMUSCULAR | Status: AC
  Administered 2017-09-29: 10 mg via INTRAMUSCULAR

## 2017-09-29 MED ORDER — ONDANSETRON HCL 8 MG PO TABS: 8.0000 mg | ORAL_TABLET | Freq: Three times a day (TID) | ORAL | 0 refills | Status: DC | PRN

## 2017-09-29 MED ORDER — PREDNISONE 10 MG (21) PO TBPK: ORAL_TABLET | ORAL | 0 refills | Status: DC

## 2017-09-29 MED ORDER — DEXAMETHASONE SODIUM PHOSPHATE 10 MG/ML IJ SOLN
INTRAMUSCULAR | Status: AC
  Filled 2017-09-29: qty 1

## 2017-09-29 MED ORDER — KETOROLAC TROMETHAMINE 60 MG/2ML IM SOLN
INTRAMUSCULAR | Status: AC
  Filled 2017-09-29: qty 2

## 2017-09-29 MED ORDER — HYDROCODONE-ACETAMINOPHEN 7.5-325 MG PO TABS: 1.0000 | ORAL_TABLET | Freq: Four times a day (QID) | ORAL | 0 refills | Status: DC | PRN

## 2017-09-29 MED ORDER — KETOROLAC TROMETHAMINE 60 MG/2ML IM SOLN
60.0000 mg | Freq: Once | INTRAMUSCULAR | Status: AC
  Administered 2017-09-29: 60 mg via INTRAMUSCULAR

## 2017-09-29 NOTE — ED Provider Notes (Signed)
[facility]    CSN: [id number] Arrival date & time: 09/29/17  1911     History   Chief Complaint Chief Complaint  Patient presents with  . Headache    HPI Anna Walker is a 62 y.o. female.   HPI  She states she has had migraine headaches for many years.  She states that when the counter ibuprofen Tylenol and Excedrin no longer work she went to her family doctor.  She was referred to neurology.  She has optic neuritis.  She does have migraines.  She states that she has this Imitrex to use for her headaches and this usually works well for her.  Normally only needs 1 dose.  Headaches rarely last more than 1 day.  This headache, however, has been going on for almost a week.  She went to her neurologist yesterday but was unable to get a shot for the migraine because she had driven herself.  She got a shot of Toradol but she states that gave her no improvement at all.  He did give her a prednisone pack.  She states she has not gotten this filled yet.  I explained to her that even though Excedrin, Tylenol, and ibuprofen stopped working for her on a routine basis there is still worthwhile to try when she has a 7-day headache.  She has taken over-the-counter medicines.  She does not have any medicine at home for migraine except for the Imitrex.  She has nausea.  She has photophobia.  She has headache.  Has some neck muscle pain.  No fall or head injury.  No sinus symptoms.  No fever.  No weakness in arms and legs.  No facial asymmetry or behavior change.  Past Medical History:  Diagnosis Date  . Diabetes mellitus without complication ([place])   . GERD (gastroesophageal reflux disease)   . Multiple sclerosis ([place])   . OSA (obstructive sleep apnea)   . Restless leg syndrome   . Thyroid disease    Hypothyroidism    Patient Active Problem List   Diagnosis Date Noted  . DOE (dyspnea on exertion)   . Substernal chest pain 02/08/2015  . Attention deficit disorder 12/22/2014  .  History of optic neuritis 06/20/2014  . OSA on CPAP 06/20/2014  . Restless leg syndrome 06/20/2014  . Other fatigue 06/20/2014  . Migraine without aura and without status migrainosus, not intractable 06/20/2014  . possible MS 02/24/2014  . Hypothyroidism 02/24/2014  . GERD (gastroesophageal reflux disease) 02/24/2014    Past Surgical History:  Procedure Laterality Date  . ABDOMINAL HYSTERECTOMY    . CESAREAN SECTION      OB History   None      Home Medications    Prior to Admission medications   Medication Sig Start Date End Date Taking? Authorizing Provider  amantadine (SYMMETREL) 100 MG capsule TAKE 1 CAPSULE(100 MG) BY MOUTH THREE TIMES DAILY 03/13/17   Sater, Nanine Means, MD  amantadine (SYMMETREL) 100 MG capsule TAKE 1 CAPSULE(100 MG) BY MOUTH THREE TIMES DAILY 04/11/17   Sater, Nanine Means, MD  aspirin (ASPIRIN EC) 81 MG EC tablet Take 81 mg by mouth daily. Swallow whole.    [provider]  FLUoxetine (PROZAC) 10 MG capsule Take 10 mg by mouth daily. 03/19/17   [provider]  FLUoxetine (PROZAC) 20 MG tablet Take 30 mg by mouth daily.    [provider]  gabapentin (NEURONTIN) 300 MG capsule Take up to three pills a day in  the evening and bedtime 03/13/17   Sater, Nanine Means, MD  hydrochlorothiazide (HYDRODIURIL) 12.5 MG tablet  02/06/17   [provider]  HYDROcodone-acetaminophen (NORCO) 7.5-325 MG tablet Take 1 tablet by mouth every 6 (six) hours as needed for moderate pain. If pain not relieved in one hour may take second dose 09/29/17   Raylene Everts, MD  levothyroxine (SYNTHROID, LEVOTHROID) 150 MCG tablet Take 150 mcg by mouth daily before breakfast.    [provider]  omeprazole (PRILOSEC) 40 MG capsule Take 1 capsule (40 mg total) by mouth 2 (two) times daily before a meal. 02/10/15   Barton Dubois, MD  ondansetron (ZOFRAN) 8 MG tablet Take 1 tablet (8 mg total) by mouth every 8 (eight) hours as needed for nausea or  vomiting. 09/29/17   Raylene Everts, MD  oxybutynin (DITROPAN) 5 MG tablet Take 5 mg by mouth daily.     [provider]  predniSONE (STERAPRED UNI-PAK 21 TAB) 10 MG (21) TBPK tablet As directed 09/29/17   Pieter Partridge, DO  ranitidine (ZANTAC) 150 MG tablet Take 150 mg by mouth 2 (two) times daily.    [provider]  rOPINIRole (REQUIP) 3 MG tablet TAKE 1 TABLET BY MOUTH DAILY AT [facility] AND 1 TABLET AT BEDTIME 03/13/17   Sater, Nanine Means, MD  simvastatin (ZOCOR) 10 MG tablet Take 10 mg by mouth daily.    [provider]  SUMAtriptan (IMITREX) 100 MG tablet Take 1 tablet (100 mg total) by mouth once as needed for migraine. May repeat in 2 hours if headache persists or recurs. 12/24/15   Sater, Nanine Means, MD    Family History Family History  Problem Relation Age of Onset  . Barrett's esophagus Mother   . Hypertension Mother   . Graves' disease Sister   . Rheum arthritis Sister   . Heart attack Father 51  . Coronary artery disease Neg Hx     Social History Social History   Tobacco Use  . Smoking status: Former Smoker    Last attempt to quit: 01/25/1984    Years since quitting: 33.7  . Smokeless tobacco: Never Used  Substance Use Topics  . Alcohol use: No  . Drug use: No     Allergies   Adderall [amphetamine-dextroamphetamine]   Review of Systems Review of Systems  Constitutional: Negative for chills and fever.  HENT: Negative for ear pain and sore throat.   Eyes: Positive for photophobia. Negative for pain and visual disturbance.  Respiratory: Negative for cough and shortness of breath.   Cardiovascular: Negative for chest pain and palpitations.  Gastrointestinal: Positive for nausea. Negative for abdominal pain and vomiting.  Genitourinary: Negative for dysuria and hematuria.  Musculoskeletal: Positive for neck stiffness. Negative for arthralgias and back pain.  Skin: Negative for color change and rash.  Neurological: Positive for headaches.  Negative for seizures and syncope.  All other systems reviewed and are negative.    Physical Exam Triage Vital Signs ED Triage Vitals  Enc Vitals Group     BP 09/29/17 1933 (!) 98/50     Pulse Rate 09/29/17 1933 77     Resp 09/29/17 1933 18     Temp 09/29/17 1933 98.9 F (37.2 C)     Temp src --      SpO2 09/29/17 1933 99 %     Weight --      Height --      Head Circumference --      Peak  Flow --      Pain Score 09/29/17 1932 8     Pain Loc --      Pain Edu? --      Excl. in [place]? --    No data found.  Updated Vital Signs BP (!) 98/50   Pulse 77   Temp 98.9 F (37.2 C)   Resp 18   SpO2 99%      Physical Exam  Constitutional: She is oriented to person, place, and time. She appears well-developed and well-nourished. No distress.  HENT:  Head: Normocephalic and atraumatic.  Mouth/Throat: Oropharynx is clear and moist.  Eyes: Pupils are equal, round, and reactive to light. Conjunctivae and EOM are normal. Right eye exhibits normal extraocular motion and no nystagmus. Left eye exhibits normal extraocular motion and no nystagmus.  Neck: Normal range of motion. Neck supple.  Cardiovascular: Normal rate, regular rhythm and normal heart sounds.  Pulmonary/Chest: Effort normal and breath sounds normal. No respiratory distress.  Abdominal: Soft. She exhibits no distension.  Musculoskeletal: Normal range of motion. She exhibits no edema.  Lymphadenopathy:    She has no cervical adenopathy.  Neurological: She is alert and oriented to person, place, and time. She has normal strength. She displays normal reflexes. Coordination and gait normal.  Skin: Skin is warm and dry. No rash noted.  Psychiatric: She has a normal mood and affect. Her behavior is normal.  Focal findings on exam.  Patient moderately photophobic.  Pupils are equal.   UC Treatments / Results  Labs (all labs ordered are listed, but only abnormal results are displayed) Labs Reviewed - No data to  display  EKG None  Radiology No results found.  Procedures Procedures (including critical care time)  Medications Ordered in UC Medications  ketorolac (TORADOL) injection 60 mg (60 mg Intramuscular Given 09/29/17 2121)  dexamethasone (DECADRON) injection 10 mg (10 mg Intramuscular Given 09/29/17 2121)    Initial Impression / Assessment and Plan / UC Course  I have reviewed the triage vital signs and the nursing notes.  Pertinent labs & imaging results that were available during my care of the patient were reviewed by me and considered in my medical decision making (see chart for details).    Explained that the strongest shot we have is Toradol.  Offered her Vicodin which she declines.  She will take a prescription for nausea medicine and pain medicine to have at home over the weekend in case she feels worse instead of better.  Would like to keep her out of the ER if we can.  She needs to take the Dosepak prescribed by her neurologist.  She may return for problems.  Call her physician Monday for follow-up. Final Clinical Impressions(s) / UC Diagnoses   Final diagnoses:  Intractable migraine without aura and without status migrainosus     Discharge Instructions     Go home and rest I have prescribed a pain pill and nausea pill to take if needed tomorrow.  These are for persistent severe pain.  Take them for your comfort so you will not end up in the ER over this weekend. Take the prednisone pack prescribed by your neurologist yesterday    ED Prescriptions    Medication Sig Dispense Auth. Provider   HYDROcodone-acetaminophen (NORCO) 7.5-325 MG tablet Take 1 tablet by mouth every 6 (six) hours as needed for moderate pain. If pain not relieved in one hour may take second dose 10 tablet Raylene Everts, MD   ondansetron [facility])  8 MG tablet Take 1 tablet (8 mg total) by mouth every 8 (eight) hours as needed for nausea or vomiting. 10 tablet Raylene Everts, MD     Controlled  Substance Prescriptions [place] Controlled Substance Registry consulted? Not Applicable   Raylene Everts, MD 09/29/17 2202

## 2017-09-29 NOTE — Telephone Encounter (Signed)
Patient called and was seen yesterday by Dr. Tomi Likens and she was given an injection but she said it has not helped. She said she would have a driver today if she needed to come back in for another injection? Please Call. Thanks

## 2017-09-29 NOTE — Telephone Encounter (Signed)
Called and spoke with Pt. She is going to go to an [facility] and get a migraine cocktail. I advised her I will send in the prednisone taper in the event the headache does not resolve. Advised Pt of 6,5,4,3,2,1 taking tablets

## 2017-09-29 NOTE — ED Triage Notes (Signed)
Pt presents with complaints of a headache x 1 week.  Reports history of migraine and that this feels the same.  She went to see her doctor yesterday to get a migraine cocktail and she did not have someone to drive her so she couldn't get it. History of optic neuritis.

## 2017-09-29 NOTE — Discharge Instructions (Addendum)
Go home and rest I have prescribed a pain pill and nausea pill to take if needed tomorrow.  These are for persistent severe pain.  Take them for your comfort so you will not end up in the ER over this weekend. Take the prednisone pack prescribed by your neurologist yesterday

## 2017-10-11 ENCOUNTER — Other Ambulatory Visit: Payer: Self-pay

## 2017-10-11 MED ORDER — TOPIRAMATE 50 MG PO TABS: 50.0000 mg | ORAL_TABLET | Freq: Every day | ORAL | 3 refills | Status: DC

## 2017-11-20 ENCOUNTER — Ambulatory Visit: Payer: PPO | Admitting: Neurology

## 2017-11-22 ENCOUNTER — Ambulatory Visit: Payer: PPO | Admitting: Neurology

## 2017-11-27 ENCOUNTER — Emergency Department (HOSPITAL_COMMUNITY)
Admission: EM | Admit: 2017-11-27 | Discharge: 2017-11-27 | Disposition: A | Discharge status: Home / Self Care | Payer: PPO | Attending: Emergency Medicine | Admitting: Emergency Medicine

## 2017-11-27 ENCOUNTER — Encounter (HOSPITAL_COMMUNITY): Payer: Self-pay | Admitting: Emergency Medicine

## 2017-11-27 ENCOUNTER — Emergency Department (HOSPITAL_COMMUNITY): Payer: PPO

## 2017-11-27 DIAGNOSIS — Y999 Unspecified external cause status: Secondary | ICD-10-CM | POA: Diagnosis not present

## 2017-11-27 DIAGNOSIS — S93492A Sprain of other ligament of left ankle, initial encounter: Secondary | ICD-10-CM | POA: Insufficient documentation

## 2017-11-27 DIAGNOSIS — E039 Hypothyroidism, unspecified: Secondary | ICD-10-CM | POA: Diagnosis not present

## 2017-11-27 DIAGNOSIS — Z87891 Personal history of nicotine dependence: Secondary | ICD-10-CM | POA: Diagnosis not present

## 2017-11-27 DIAGNOSIS — M25562 Pain in left knee: Secondary | ICD-10-CM | POA: Diagnosis not present

## 2017-11-27 DIAGNOSIS — G35 Multiple sclerosis: Secondary | ICD-10-CM | POA: Diagnosis not present

## 2017-11-27 DIAGNOSIS — Z7982 Long term (current) use of aspirin: Secondary | ICD-10-CM | POA: Insufficient documentation

## 2017-11-27 DIAGNOSIS — S8992XA Unspecified injury of left lower leg, initial encounter: Secondary | ICD-10-CM | POA: Diagnosis not present

## 2017-11-27 DIAGNOSIS — M25532 Pain in left wrist: Secondary | ICD-10-CM | POA: Diagnosis not present

## 2017-11-27 DIAGNOSIS — Y9301 Activity, walking, marching and hiking: Secondary | ICD-10-CM | POA: Diagnosis not present

## 2017-11-27 DIAGNOSIS — M25512 Pain in left shoulder: Secondary | ICD-10-CM | POA: Diagnosis not present

## 2017-11-27 DIAGNOSIS — Z79899 Other long term (current) drug therapy: Secondary | ICD-10-CM | POA: Diagnosis not present

## 2017-11-27 DIAGNOSIS — Y929 Unspecified place or not applicable: Secondary | ICD-10-CM | POA: Diagnosis not present

## 2017-11-27 DIAGNOSIS — W19XXXA Unspecified fall, initial encounter: Secondary | ICD-10-CM | POA: Insufficient documentation

## 2017-11-27 DIAGNOSIS — M7989 Other specified soft tissue disorders: Secondary | ICD-10-CM | POA: Diagnosis not present

## 2017-11-27 DIAGNOSIS — E119 Type 2 diabetes mellitus without complications: Secondary | ICD-10-CM | POA: Insufficient documentation

## 2017-11-27 DIAGNOSIS — S99912A Unspecified injury of left ankle, initial encounter: Secondary | ICD-10-CM | POA: Diagnosis not present

## 2017-11-27 DIAGNOSIS — S4992XA Unspecified injury of left shoulder and upper arm, initial encounter: Secondary | ICD-10-CM | POA: Diagnosis not present

## 2017-11-27 DIAGNOSIS — M25572 Pain in left ankle and joints of left foot: Secondary | ICD-10-CM | POA: Diagnosis not present

## 2017-11-27 DIAGNOSIS — S6992XA Unspecified injury of left wrist, hand and finger(s), initial encounter: Secondary | ICD-10-CM | POA: Diagnosis not present

## 2017-11-27 MED ORDER — CYCLOBENZAPRINE HCL 10 MG PO TABS
10.0000 mg | ORAL_TABLET | Freq: Once | ORAL | Status: AC
  Administered 2017-11-27: 10 mg via ORAL
  Filled 2017-11-27: qty 1

## 2017-11-27 MED ORDER — OXYCODONE HCL 5 MG PO TABS
5.0000 mg | ORAL_TABLET | Freq: Once | ORAL | Status: AC
  Administered 2017-11-27: 5 mg via ORAL
  Filled 2017-11-27: qty 1

## 2017-11-27 MED ORDER — ACETAMINOPHEN 500 MG PO TABS
1000.0000 mg | ORAL_TABLET | Freq: Once | ORAL | Status: AC
  Administered 2017-11-27: 1000 mg via ORAL
  Filled 2017-11-27: qty 2

## 2017-11-27 NOTE — ED Provider Notes (Signed)
[facility] DEPT Provider Note   CSN: [id number] Arrival date & time: 11/27/17  0919     History   Chief Complaint Chief Complaint  Patient presents with  . Fall  . Ankle Pain  . Knee Pain  . Hip Pain  . Wrist Pain    HPI SHALAYA SWAILES is a 62 y.o. female.  HPI   62 year old female with history of MS, diabetes, hypothyroidism,, OSA, presents with concern for mechanical fall with left knee and ankle pain.  Patient reports he was walking up, anterior portion of her knee.  Also reports lateral ankle pain on the left.  Reports these areas are the most severe, she is been unable to bear weight on that leg since the fall.  Reports she also has some pain in her shoulder and wrist.  Reports is severe.  Reports she has neck pain which is lateral.  Reports she has chronic back pain and feels her back is sore now.  Has a history of knee surgeries, and sees Dr. Ronnie Derby. Denies head trauma, syncope, headache. Reports left ankle has numbness but denies any other areas.  Past Medical History:  Diagnosis Date  . Diabetes mellitus without complication ([place])   . GERD (gastroesophageal reflux disease)   . Multiple sclerosis ([place])   . OSA (obstructive sleep apnea)   . Restless leg syndrome   . Thyroid disease    Hypothyroidism    Patient Active Problem List   Diagnosis Date Noted  . DOE (dyspnea on exertion)   . Substernal chest pain 02/08/2015  . Attention deficit disorder 12/22/2014  . History of optic neuritis 06/20/2014  . OSA on CPAP 06/20/2014  . Restless leg syndrome 06/20/2014  . Other fatigue 06/20/2014  . Migraine without aura and without status migrainosus, not intractable 06/20/2014  . possible MS 02/24/2014  . Hypothyroidism 02/24/2014  . GERD (gastroesophageal reflux disease) 02/24/2014    Past Surgical History:  Procedure Laterality Date  . ABDOMINAL HYSTERECTOMY    . CESAREAN SECTION       OB History   None      Home  Medications    Prior to Admission medications   Medication Sig Start Date End Date Taking? Authorizing Provider  amantadine (SYMMETREL) 100 MG capsule TAKE 1 CAPSULE(100 MG) BY MOUTH THREE TIMES DAILY Patient taking differently: Take 300 mg by mouth daily. TAKE 1 CAPSULE(100 MG) BY MOUTH THREE TIMES DAILY 03/13/17  Yes Sater, Nanine Means, MD  aspirin (ASPIRIN EC) 81 MG EC tablet Take 81 mg by mouth daily. Swallow whole.   Yes [provider]  FLUoxetine (PROZAC) 10 MG capsule Take 10 mg by mouth daily. 03/19/17  Yes [provider]  FLUoxetine (PROZAC) 20 MG tablet Take 30 mg by mouth daily.   Yes [provider]  gabapentin (NEURONTIN) 300 MG capsule Take up to three pills a day in the evening and bedtime Patient taking differently: Take 300-600 mg by mouth 2 (two) times daily. 300 mg at dinner and 600 mg at bedtime 03/13/17  Yes Sater, Nanine Means, MD  hydrochlorothiazide (HYDRODIURIL) 12.5 MG tablet Take 12.5 mg by mouth daily.  02/06/17  Yes [provider]  levothyroxine (SYNTHROID, LEVOTHROID) 150 MCG tablet Take 150 mcg by mouth daily before breakfast.   Yes [provider]  omeprazole (PRILOSEC) 40 MG capsule Take 1 capsule (40 mg total) by mouth 2 (two) times daily before a meal. 02/10/15  Yes Barton Dubois, MD  oxybutynin (DITROPAN) 5  MG tablet Take 5 mg by mouth daily.    Yes [provider]  ranitidine (ZANTAC) 150 MG tablet Take 150 mg by mouth 2 (two) times daily.   Yes [provider]  rOPINIRole (REQUIP) 3 MG tablet TAKE 1 TABLET BY MOUTH DAILY AT DINNER AND 1 TABLET AT BEDTIME Patient taking differently: Take 3 mg by mouth. TAKE 1 TABLET BY MOUTH DAILY AT DINNER as needed AND 1 TABLET AT BEDTIME 03/13/17  Yes Sater, Nanine Means, MD  simvastatin (ZOCOR) 10 MG tablet Take 10 mg by mouth daily.   Yes [provider]  traMADol (ULTRAM) 50 MG tablet Take 50 mg by mouth 2 (two) times daily as needed for pain. 11/28/16  Yes  [provider]  amantadine (SYMMETREL) 100 MG capsule TAKE 1 CAPSULE(100 MG) BY MOUTH THREE TIMES DAILY Patient not taking: Reported on 11/27/2017 04/11/17   Sater, Nanine Means, MD  HYDROcodone-acetaminophen (NORCO) 7.5-325 MG tablet Take 1 tablet by mouth every 6 (six) hours as needed for moderate pain. If pain not relieved in one hour may take second dose Patient not taking: Reported on 11/27/2017 09/29/17   Raylene Everts, MD  ondansetron (ZOFRAN) 8 MG tablet Take 1 tablet (8 mg total) by mouth every 8 (eight) hours as needed for nausea or vomiting. Patient not taking: Reported on 11/27/2017 09/29/17   Raylene Everts, MD  predniSONE (STERAPRED UNI-PAK 21 TAB) 10 MG (21) TBPK tablet As directed Patient not taking: Reported on 11/27/2017 09/29/17   Pieter Partridge, DO  SUMAtriptan (IMITREX) 100 MG tablet Take 1 tablet (100 mg total) by mouth once as needed for migraine. May repeat in 2 hours if headache persists or recurs. Patient not taking: Reported on 11/27/2017 12/24/15   Britt Bottom, MD  topiramate (TOPAMAX) 50 MG tablet Take 1 tablet (50 mg total) by mouth at bedtime. Patient not taking: Reported on 11/27/2017 10/11/17   Pieter Partridge, DO    Family History Family History  Problem Relation Age of Onset  . Barrett's esophagus Mother   . Hypertension Mother   . Graves' disease Sister   . Rheum arthritis Sister   . Heart attack Father 58  . Coronary artery disease Neg Hx     Social History Social History   Tobacco Use  . Smoking status: Former Smoker    Last attempt to quit: 01/25/1984    Years since quitting: 33.8  . Smokeless tobacco: Never Used  Substance Use Topics  . Alcohol use: No  . Drug use: No     Allergies   Adderall [amphetamine-dextroamphetamine] and Topamax [topiramate]   Review of Systems Review of Systems  Constitutional: Negative for fever.  HENT: Negative for sore throat.   Eyes: Negative for visual disturbance.  Respiratory: Negative for  shortness of breath.   Cardiovascular: Negative for chest pain.  Gastrointestinal: Negative for abdominal pain, nausea and vomiting.  Musculoskeletal: Positive for arthralgias, gait problem, joint swelling, myalgias and neck pain. Negative for back pain.  Skin: Negative for rash.  Neurological: Negative for syncope and headaches.     Physical Exam Updated Vital Signs BP (!) 108/59   Pulse 66   Temp 98.2 F (36.8 C) (Oral)   Resp 14   SpO2 98%   Physical Exam  Constitutional: She is oriented to person, place, and time. She appears well-developed and well-nourished. No distress.  HENT:  Head: Normocephalic and atraumatic.  Eyes: Conjunctivae and EOM are normal.  Neck: Normal range of  motion.  Cardiovascular: Normal rate, regular rhythm and intact distal pulses. Exam reveals no gallop.  2+ DP and PT pulses  Pulmonary/Chest: Effort normal. No respiratory distress.  Musculoskeletal: She exhibits no edema.       Left shoulder: She exhibits tenderness and bony tenderness.       Left wrist: She exhibits tenderness and bony tenderness (no snuff box tenderness, tenderness ulnar side).       Left knee: She exhibits decreased range of motion. She exhibits no ecchymosis and no erythema. Tenderness found. Patellar tendon tenderness noted.       Left ankle: She exhibits swelling. Tenderness. Lateral malleolus and AITFL tenderness found. No posterior TFL, no head of 5th metatarsal and no proximal fibula tenderness found.       Cervical back: She exhibits tenderness (lateral right neck). She exhibits no bony tenderness.       Left foot: There is no tenderness, no bony tenderness and normal capillary refill.  Neurological: She is alert and oriented to person, place, and time.  Skin: Skin is warm and dry. No rash noted. She is not diaphoretic. No erythema.  Nursing note and vitals reviewed.    ED Treatments / Results  Labs (all labs ordered are listed, but only abnormal results are  displayed) Labs Reviewed - No data to display  EKG None  Radiology Dg Wrist Complete Left  Result Date: 11/27/2017 CLINICAL DATA:  [place].  Left wrist pain. EXAM: LEFT WRIST - COMPLETE 3+ VIEW COMPARISON:  None. FINDINGS: Mild degenerative changes but no acute wrist fracture. IMPRESSION: No acute bony findings. Electronically Signed   By: Marijo Sanes M.D.   On: 11/27/2017 11:22   Dg Ankle Complete Left  Result Date: 11/27/2017 CLINICAL DATA:  [place] today.  Left ankle pain. EXAM: LEFT ANKLE COMPLETE - 3+ VIEW COMPARISON:  None. FINDINGS: The ankle mortise is maintained. No acute ankle fracture is identified. The mid and hindfoot bony structures are intact. No ankle joint effusion. Dystrophic appearing calcifications noted near the lateral malleolus. Moderate lateral soft tissue swelling IMPRESSION: No acute fracture. Electronically Signed   By: Marijo Sanes M.D.   On: 11/27/2017 11:20   Dg Shoulder Left  Result Date: 11/27/2017 CLINICAL DATA:  [place] today.  Left shoulder pain. EXAM: LEFT SHOULDER - 2+ VIEW COMPARISON:  None. FINDINGS: The glenohumeral joint is maintained. Moderate degenerative changes at the [facility] joint but no fracture or separation. The visualized left ribs are intact. IMPRESSION: No fracture or dislocation. Electronically Signed   By: Marijo Sanes M.D.   On: 11/27/2017 11:21   Dg Knee Complete 4 Views Left  Result Date: 11/27/2017 CLINICAL DATA:  [place] today.  Left knee pain. EXAM: LEFT KNEE - COMPLETE 4+ VIEW COMPARISON:  None. FINDINGS: Advanced tricompartmental degenerative changes with joint space narrowing and osteophytic spurring. No acute fracture or osteochondral lesion. No definite joint effusion. IMPRESSION: Advanced degenerative changes but no acute fracture. Electronically Signed   By: Marijo Sanes M.D.   On: 11/27/2017 11:18    Procedures Procedures (including critical care time)  Medications Ordered in ED Medications  oxyCODONE (Oxy IR/ROXICODONE) immediate  release tablet 5 mg (5 mg Oral Given 11/27/17 1107)  acetaminophen (TYLENOL) tablet 1,000 mg (1,000 mg Oral Given 11/27/17 1108)  cyclobenzaprine (FLEXERIL) tablet 10 mg (10 mg Oral Given 11/27/17 1108)     Initial Impression / Assessment and Plan / ED Course  I have reviewed the triage vital signs and the nursing notes.  Pertinent labs &  imaging results that were available during my care of the patient were reviewed by me and considered in my medical decision making (see chart for details).     62 year old female with history of MS, diabetes, hypothyroidism,, OSA, presents with concern for mechanical fall with left knee and ankle pain.  X-rays of the left shoulder, wrist, knee and ankle show no evidence of fracture.  Reports these areas with worse pain, does not have significant back pain and doubt fracture.  No midline neck tenderness, doubt cervical spine injury. Suspect swelling of left ankle from sprain contributing to altered sensation in left ankle.  Given aircast.  Pt with good pulses, no sign of vascular injury.  Doubt occult tibial plateau fracture given areas of tenderness, more patellar tenderness..  Patient able to extend and hold up knee and doubt patellar tendon rupture.  Given knee sleeve, recommend follow up with Orthopedics, supportive care.  Final Clinical Impressions(s) / ED Diagnoses   Final diagnoses:  Fall, initial encounter  Sprain of anterior talofibular ligament of left ankle, initial encounter  Acute pain of left knee    ED Discharge Orders    None       Gareth Morgan, MD 11/27/17 2116

## 2017-11-27 NOTE — ED Triage Notes (Signed)
Pt reports that she slipped on icy ramp this morning. C/o left ankle, knee, hip, wrist, back and neck pains. Denies LOC. Reports took "pain pill" at 8am.

## 2017-11-27 NOTE — ED Notes (Signed)
Patient transported to X-ray 

## 2017-11-29 DIAGNOSIS — G8929 Other chronic pain: Secondary | ICD-10-CM | POA: Diagnosis not present

## 2017-11-29 DIAGNOSIS — M170 Bilateral primary osteoarthritis of knee: Secondary | ICD-10-CM | POA: Diagnosis not present

## 2017-12-11 DIAGNOSIS — S93412D Sprain of calcaneofibular ligament of left ankle, subsequent encounter: Secondary | ICD-10-CM | POA: Diagnosis not present

## 2018-02-02 ENCOUNTER — Other Ambulatory Visit: Payer: Self-pay | Admitting: Neurology

## 2018-02-06 ENCOUNTER — Ambulatory Visit: Payer: PPO | Admitting: Neurology

## 2018-02-15 DIAGNOSIS — E785 Hyperlipidemia, unspecified: Secondary | ICD-10-CM | POA: Diagnosis not present

## 2018-02-15 DIAGNOSIS — G4733 Obstructive sleep apnea (adult) (pediatric): Secondary | ICD-10-CM | POA: Diagnosis not present

## 2018-02-15 DIAGNOSIS — E1169 Type 2 diabetes mellitus with other specified complication: Secondary | ICD-10-CM | POA: Diagnosis not present

## 2018-02-15 DIAGNOSIS — I10 Essential (primary) hypertension: Secondary | ICD-10-CM | POA: Diagnosis not present

## 2018-02-15 DIAGNOSIS — E039 Hypothyroidism, unspecified: Secondary | ICD-10-CM | POA: Diagnosis not present

## 2018-02-15 DIAGNOSIS — R635 Abnormal weight gain: Secondary | ICD-10-CM | POA: Diagnosis not present

## 2018-02-15 DIAGNOSIS — Z23 Encounter for immunization: Secondary | ICD-10-CM | POA: Diagnosis not present

## 2018-03-21 NOTE — Progress Notes (Signed)
NEUROLOGY FOLLOW UP OFFICE NOTE  Anna Walker [id number]  HISTORY OF PRESENT ILLNESS: Berdine Rasmusson is a 63 year old woman with ADD, fatigue, hypertension, restless leg syndrome, migraines, hyperlipidemia, type 2 diabetes mellitus, OSA, hypothyroidism and history of right optic neuritis who follows up for migraines.  UPDATE: In September, she reported a new intractable right retro-orbital headache.  Toradol injection.  She was started on topiramate 50mg  at bedtime.  However, she stopped because she couldn't tolerate it (unable to focus).  However headaches resolved after a prednisone taper.  No headaches since October.  On a couple of occasions, she had ocular pain lasting a day but it never evolved into a migraine.  HISTORY: I RIGHT OPTIC NEURITIS AND OTHER RECURRENT SYMPTOMS WITH QUESTIONABLE MULTIPLE SCLEROSIS: She had an episode of right optic neuritis in 1998, presenting first as blurred vision and then complete vision loss.A couple of weeks later, she developed right sided numbness of the face, arm and leg.She underwent a lumbar puncture which demonstrated one oligoclonal band. MRI of brain reportedly may have shown foci which may be concerning for MS. MRI cervical spine reportedly did not demonstrate demyelinating lesions. She was diagnosed with multiple sclerosis and treatedwith IV steroids. The vision loss lasted 7-8 weeks. The numbness lasted several days. She was treated with DMT for several years. She started on Betaseron but was then switched to Copaxone and then Avonex due to intolerability. She had a repeat LP in 2002 which showed no oligoclonal bands and normal IgG index of 0.6. Visual evoked potential in 2013 showed absent right response and normal left response. Repeat MRI from July 2014 was thought to be essentially normal so Avonex was discontinued. Repeat MRI in November 2014 was unchanged. Therefore, it was believed that she did not have MS. NMO-IgG  antibodies from 02/24/14 were negative.MRI of cervical spine without contrast from 03/24/16 was personally reviewed and revealed cervical spondylosis but no abnormal cord signal.  Since the episode in 1998, she endorses subtle weakness on the right side of her body. For example, when she starts to write, her handwriting will start to get worse. She also reports short-term memory deficits since 1998. Over the years, she has had recurrent episodes of right eye pain with right sided numbness. In the past, they were thought to be MS flares which were treated with steroids. They occur when she is heated or fatigued. They typically occur at least once a year and they last about 2 days. She last had an episode in early January 2019, for which she was evaluated in the ED on 01/30/17. She had an MRI of the brain and orbits with and without contrast, which was personally reviewed and demonstrated asymmetrically small right optic nerve compatible with sequelae of prior optic neuritis but no enhancement, as well as three nonspecific tiny hyperintense T2/FLAIR foci in the bifrontal periventricular white matter.All of her symptoms have not progressed over the years and recurrence of these episodes have not increased since discontinuation of Avonex.  She has pain in the legs and gait issues. She does have arthritis in the knees, which required surgery and had a subsequent DVT. She is treated for restless leg syndrome, for which she takes ropinirole and gabapentin.  She also reports chronic fatigue. She has OSA and is on CPAP. She takes amantadine.  II MIGRAINES She has history of migraines since early adulthood. They are severe bifrontal pain associated with nausea, photophobia and phonophobia but no vomiting, new visual disturbance or unilateral numbness or  weakness. They last 1 to 2 days and usually occur once a year.  There are no specific triggers or relieving factors however eye gets more blurred when  fatigued or with drop in blood sugar.   She also reports severe sharp right retro-orbital pain in September 2019.  Constant but fluctuates.  There is associated blurred vision in right eye, dizziness, nausea, photophobia and phonophobia, eye twitching and fatigue.  No associated right sided numbness and weakness.  Current NSAIDS: ASA 81mg  daily Current analgesics:no Current triptans: sumatriptan 100mg  Current anti-emetic:no Current muscle relaxants:no Current anti-anxiolytic:no Current sleep aide:no Current Antihypertensive medications: HCTZ Current Antidepressant medications: fluoxetine 30mg  Current Anticonvulsant medications: gabapentin 300mg  evening and 600mg  at bedtime Other medications: Ropinirole 3mg  dinner and 3mg  bedtime, amantadine 100mg  three times daily (fatigue)   Past medication:  topiramate  PAST MEDICAL HISTORY: Past Medical History:  Diagnosis Date  . Diabetes mellitus without complication ([place])   . GERD (gastroesophageal reflux disease)   . Multiple sclerosis ([place])   . OSA (obstructive sleep apnea)   . Restless leg syndrome   . Thyroid disease    Hypothyroidism    MEDICATIONS: Current Outpatient Medications on File Prior to Visit  Medication Sig Dispense Refill  . amantadine (SYMMETREL) 100 MG capsule TAKE 1 CAPSULE(100 MG) BY MOUTH THREE TIMES DAILY (Patient taking differently: Take 300 mg by mouth daily. TAKE 1 CAPSULE(100 MG) BY MOUTH THREE TIMES DAILY) 270 capsule 3  . amantadine (SYMMETREL) 100 MG capsule TAKE 1 CAPSULE(100 MG) BY MOUTH THREE TIMES DAILY (Patient not taking: Reported on 11/27/2017) 270 capsule 3  . aspirin (ASPIRIN EC) 81 MG EC tablet Take 81 mg by mouth daily. Swallow whole.    Marland Kitchen FLUoxetine (PROZAC) 10 MG capsule Take 10 mg by mouth daily.    Marland Kitchen FLUoxetine (PROZAC) 20 MG tablet Take 30 mg by mouth daily.    Marland Kitchen gabapentin (NEURONTIN) 300 MG capsule Take up to three pills a day in the evening and bedtime (Patient taking  differently: Take 300-600 mg by mouth 2 (two) times daily. 300 mg at dinner and 600 mg at bedtime) 270 capsule 4  . hydrochlorothiazide (HYDRODIURIL) 12.5 MG tablet Take 12.5 mg by mouth daily.     Marland Kitchen HYDROcodone-acetaminophen (NORCO) 7.5-325 MG tablet Take 1 tablet by mouth every 6 (six) hours as needed for moderate pain. If pain not relieved in one hour may take second dose (Patient not taking: Reported on 11/27/2017) 10 tablet 0  . levothyroxine (SYNTHROID, LEVOTHROID) 150 MCG tablet Take 150 mcg by mouth daily before breakfast.    . omeprazole (PRILOSEC) 40 MG capsule Take 1 capsule (40 mg total) by mouth 2 (two) times daily before a meal. 60 capsule 1  . ondansetron (ZOFRAN) 8 MG tablet Take 1 tablet (8 mg total) by mouth every 8 (eight) hours as needed for nausea or vomiting. (Patient not taking: Reported on 11/27/2017) 10 tablet 0  . oxybutynin (DITROPAN) 5 MG tablet Take 5 mg by mouth daily.     . predniSONE (STERAPRED UNI-PAK 21 TAB) 10 MG (21) TBPK tablet As directed (Patient not taking: Reported on 11/27/2017) 21 tablet 0  . ranitidine (ZANTAC) 150 MG tablet Take 150 mg by mouth 2 (two) times daily.    Marland Kitchen rOPINIRole (REQUIP) 3 MG tablet TAKE 1 TABLET BY MOUTH DAILY AT DINNER AND 1 TABLET AT BEDTIME.  PLEASE CALL [phone] TO SCHEDULE AN APPT. 180 tablet 0  . simvastatin (ZOCOR) 10 MG tablet Take 10 mg by mouth daily.    Marland Kitchen  SUMAtriptan (IMITREX) 100 MG tablet Take 1 tablet (100 mg total) by mouth once as needed for migraine. May repeat in 2 hours if headache persists or recurs. (Patient not taking: Reported on 11/27/2017) 10 tablet 5  . topiramate (TOPAMAX) 50 MG tablet Take 1 tablet (50 mg total) by mouth at bedtime. (Patient not taking: Reported on 11/27/2017) 30 tablet 3  . traMADol (ULTRAM) 50 MG tablet Take 50 mg by mouth 2 (two) times daily as needed for pain.     No current facility-administered medications on file prior to visit.     ALLERGIES: Allergies  Allergen Reactions  . Adderall  [Amphetamine-Dextroamphetamine] Other (See Comments)    Caused elevated BP  . Topamax [Topiramate]     dizzy    FAMILY HISTORY: Family History  Problem Relation Age of Onset  . Barrett's esophagus Mother   . Hypertension Mother   . Graves' disease Sister   . Rheum arthritis Sister   . Heart attack Father 10  . Coronary artery disease Neg Hx    SOCIAL HISTORY: Social History   Socioeconomic History  . Marital status: Married    Spouse name: Eddie Dibbles  . Number of children: 2  . Years of education: Not on file  . Highest education level: Some college, no degree  Occupational History  . Occupation: disabled  Social Needs  . Financial resource strain: Not on file  . Food insecurity:    Worry: Not on file    Inability: Not on file  . Transportation needs:    Medical: Not on file    Non-medical: Not on file  Tobacco Use  . Smoking status: Former Smoker    Last attempt to quit: 01/25/1984    Years since quitting: 34.1  . Smokeless tobacco: Never Used  Substance and Sexual Activity  . Alcohol use: No  . Drug use: No  . Sexual activity: Yes    Birth control/protection: Surgical  Lifestyle  . Physical activity:    Days per week: Not on file    Minutes per session: Not on file  . Stress: Not on file  Relationships  . Social connections:    Talks on phone: Not on file    Gets together: Not on file    Attends religious service: Not on file    Active member of club or organization: Not on file    Attends meetings of clubs or organizations: Not on file    Relationship status: Not on file  . Intimate partner violence:    Fear of current or ex partner: Not on file    Emotionally abused: Not on file    Physically abused: Not on file    Forced sexual activity: Not on file  Other Topics Concern  . Not on file  Social History Narrative   Pt is right handed. She is married, lives with her husband in a 1 story house, ramp to enter. She drinks 2 cups of coffee a day.    REVIEW  OF SYSTEMS: Constitutional: No fevers, chills, or sweats, no generalized fatigue, change in appetite Eyes: No visual changes, double vision, eye pain Ear, nose and throat: No hearing loss, ear pain, nasal congestion, sore throat Cardiovascular: No chest pain, palpitations Respiratory:  No shortness of breath at rest or with exertion, wheezes GastrointestinaI: No nausea, vomiting, diarrhea, abdominal pain, fecal incontinence Genitourinary:  No dysuria, urinary retention or frequency Musculoskeletal:  No neck pain, back pain Integumentary: No rash, pruritus, skin lesions Neurological: as above  Psychiatric: No depression, insomnia, anxiety Endocrine: No palpitations, fatigue, diaphoresis, mood swings, change in appetite, change in weight, increased thirst Hematologic/Lymphatic:  No purpura, petechiae. Allergic/Immunologic: no itchy/runny eyes, nasal congestion, recent allergic reactions, rashes  PHYSICAL EXAM: Blood pressure 110/74, pulse 69, height 5' 5.2" (1.656 m), weight 281 lb (127.5 kg), SpO2 98 %. General: No acute distress.  Patient appears well-groomed.  Head:  Normocephalic/atraumatic Eyes:  Fundi examined but not visualized Neck: supple, no paraspinal tenderness, full range of motion Heart:  Regular rate and rhythm Lungs:  Clear to auscultation bilaterally Back: No paraspinal tenderness Neurological Exam: alert and oriented to person, place, and time. Attention span and concentration intact, recent and remote memory intact, fund of knowledge intact.  Speech fluent and not dysarthric, language intact.  CN II-XII intact. Bulk and tone normal, muscle strength 5/5 throughout.  Sensation to light touch, temperature and vibration intact.  Deep tendon reflexes 2+ throughout, toes downgoing.  Finger to nose and heel to shin testing intact.  Gait normal, Romberg negative.  IMPRESSION: 1.  migraine with aura, without status migrainosus, not intractable, stable 2.  Recurrent symptoms with  right sided numbness and weakness, likely complicated migraine.  Based on testing that has been performed, I do not believe Ms. Encalade has multiple sclerosis.  CSF analysis revealed 1 oligoclonal band, which is not a significant number.  She has had repeated MRIs over the years with minimal (3 ) tiny hyperintense foci in the periventricular white matter which have been stable.  Given her longstanding history with recurrent flare-ups, I would expect radiographic evidence of disease progression on MRI.  She also does not exhibit abnormal findings in other regions of the CNS, such as supratentorial region or spinal cord.    PLAN: 1.  Continue current management.  She said she will need refills of her medications and have them sent over to me.  Soon, she will need refill of amantadine 2.  Limit use of pain relievers to no more than 2 days out of week to prevent risk of rebound or medication-overuse headache. 3.  Keep headache diary 4.  Follow up in 6 months  Metta Clines, DO  CC: Kelton Pillar, MD

## 2018-03-22 ENCOUNTER — Other Ambulatory Visit: Payer: Self-pay | Admitting: Neurology

## 2018-03-23 ENCOUNTER — Ambulatory Visit: Payer: PPO | Admitting: Neurology

## 2018-03-23 ENCOUNTER — Encounter: Payer: Self-pay | Admitting: Neurology

## 2018-03-23 VITALS — BP 110/74 | HR 69 | Ht 65.2 in | Wt 281.0 lb

## 2018-03-23 DIAGNOSIS — Z8669 Personal history of other diseases of the nervous system and sense organs: Secondary | ICD-10-CM | POA: Diagnosis not present

## 2018-03-23 DIAGNOSIS — G43009 Migraine without aura, not intractable, without status migrainosus: Secondary | ICD-10-CM

## 2018-03-23 DIAGNOSIS — R197 Diarrhea, unspecified: Secondary | ICD-10-CM | POA: Diagnosis not present

## 2018-03-23 DIAGNOSIS — R1013 Epigastric pain: Secondary | ICD-10-CM | POA: Diagnosis not present

## 2018-03-23 NOTE — Patient Instructions (Signed)
1.  Will refill amantadine when we receive request 2.  Follow up in 6 months

## 2018-03-25 ENCOUNTER — Other Ambulatory Visit: Payer: Self-pay | Admitting: Neurology

## 2018-03-26 ENCOUNTER — Other Ambulatory Visit: Payer: Self-pay | Admitting: Physician Assistant

## 2018-03-26 DIAGNOSIS — R1013 Epigastric pain: Secondary | ICD-10-CM

## 2018-03-27 ENCOUNTER — Other Ambulatory Visit: Payer: PPO

## 2018-03-27 ENCOUNTER — Ambulatory Visit
Admission: RE | Admit: 2018-03-27 | Discharge: 2018-03-27 | Disposition: A | Discharge status: Home / Self Care | Payer: PPO | Source: Ambulatory Visit | Attending: Physician Assistant | Admitting: Physician Assistant

## 2018-03-27 DIAGNOSIS — R1011 Right upper quadrant pain: Secondary | ICD-10-CM | POA: Diagnosis not present

## 2018-03-27 DIAGNOSIS — R1013 Epigastric pain: Secondary | ICD-10-CM

## 2018-04-06 DIAGNOSIS — R1011 Right upper quadrant pain: Secondary | ICD-10-CM | POA: Diagnosis not present

## 2018-04-06 DIAGNOSIS — R197 Diarrhea, unspecified: Secondary | ICD-10-CM | POA: Diagnosis not present

## 2018-04-06 DIAGNOSIS — Z8371 Family history of colonic polyps: Secondary | ICD-10-CM | POA: Diagnosis not present

## 2018-04-06 DIAGNOSIS — Z1211 Encounter for screening for malignant neoplasm of colon: Secondary | ICD-10-CM | POA: Diagnosis not present

## 2018-04-06 DIAGNOSIS — R1013 Epigastric pain: Secondary | ICD-10-CM | POA: Diagnosis not present

## 2018-04-09 ENCOUNTER — Other Ambulatory Visit: Payer: Self-pay | Admitting: Physician Assistant

## 2018-04-09 ENCOUNTER — Other Ambulatory Visit (HOSPITAL_COMMUNITY): Payer: Self-pay | Admitting: Physician Assistant

## 2018-04-09 DIAGNOSIS — R1011 Right upper quadrant pain: Secondary | ICD-10-CM

## 2018-05-09 DIAGNOSIS — G2581 Restless legs syndrome: Secondary | ICD-10-CM | POA: Diagnosis not present

## 2018-05-09 DIAGNOSIS — G4733 Obstructive sleep apnea (adult) (pediatric): Secondary | ICD-10-CM | POA: Diagnosis not present

## 2018-05-15 ENCOUNTER — Encounter (HOSPITAL_COMMUNITY): Payer: Self-pay

## 2018-05-15 ENCOUNTER — Encounter (HOSPITAL_COMMUNITY): Payer: PPO

## 2018-06-01 DIAGNOSIS — G4733 Obstructive sleep apnea (adult) (pediatric): Secondary | ICD-10-CM | POA: Diagnosis not present

## 2018-06-22 ENCOUNTER — Other Ambulatory Visit: Payer: Self-pay | Admitting: Neurology

## 2018-06-27 ENCOUNTER — Encounter (HOSPITAL_COMMUNITY)
Admission: RE | Admit: 2018-06-27 | Discharge: 2018-06-27 | Disposition: A | Discharge status: Home / Self Care | Payer: PPO | Source: Ambulatory Visit | Attending: Physician Assistant | Admitting: Physician Assistant

## 2018-06-27 ENCOUNTER — Other Ambulatory Visit: Payer: Self-pay

## 2018-06-27 DIAGNOSIS — R1011 Right upper quadrant pain: Secondary | ICD-10-CM | POA: Insufficient documentation

## 2018-06-27 MED ORDER — TECHNETIUM TC 99M MEBROFENIN IV KIT
4.8000 | PACK | Freq: Once | INTRAVENOUS | Status: AC | PRN
  Administered 2018-06-27: 4.8 via INTRAVENOUS

## 2018-07-02 DIAGNOSIS — R198 Other specified symptoms and signs involving the digestive system and abdomen: Secondary | ICD-10-CM | POA: Diagnosis not present

## 2018-07-02 DIAGNOSIS — K219 Gastro-esophageal reflux disease without esophagitis: Secondary | ICD-10-CM | POA: Diagnosis not present

## 2018-07-02 DIAGNOSIS — G4733 Obstructive sleep apnea (adult) (pediatric): Secondary | ICD-10-CM | POA: Diagnosis not present

## 2018-07-02 DIAGNOSIS — R110 Nausea: Secondary | ICD-10-CM | POA: Diagnosis not present

## 2018-07-02 DIAGNOSIS — R1011 Right upper quadrant pain: Secondary | ICD-10-CM | POA: Diagnosis not present

## 2018-07-02 DIAGNOSIS — K828 Other specified diseases of gallbladder: Secondary | ICD-10-CM | POA: Diagnosis not present

## 2018-07-07 ENCOUNTER — Other Ambulatory Visit: Payer: Self-pay | Admitting: Neurology

## 2018-07-10 ENCOUNTER — Ambulatory Visit
Admission: RE | Admit: 2018-07-10 | Discharge: 2018-07-10 | Disposition: A | Discharge status: Home / Self Care | Payer: PPO | Source: Ambulatory Visit | Attending: Family Medicine | Admitting: Family Medicine

## 2018-07-10 ENCOUNTER — Other Ambulatory Visit: Payer: Self-pay | Admitting: Family Medicine

## 2018-07-10 DIAGNOSIS — M7122 Synovial cyst of popliteal space [Baker], left knee: Secondary | ICD-10-CM | POA: Diagnosis not present

## 2018-07-10 DIAGNOSIS — M79605 Pain in left leg: Secondary | ICD-10-CM

## 2018-07-10 DIAGNOSIS — M7989 Other specified soft tissue disorders: Secondary | ICD-10-CM | POA: Diagnosis not present

## 2018-07-11 DIAGNOSIS — K8050 Calculus of bile duct without cholangitis or cholecystitis without obstruction: Secondary | ICD-10-CM | POA: Diagnosis not present

## 2018-07-11 DIAGNOSIS — Z86718 Personal history of other venous thrombosis and embolism: Secondary | ICD-10-CM | POA: Diagnosis not present

## 2018-07-11 DIAGNOSIS — G4733 Obstructive sleep apnea (adult) (pediatric): Secondary | ICD-10-CM | POA: Diagnosis not present

## 2018-07-11 DIAGNOSIS — Z9989 Dependence on other enabling machines and devices: Secondary | ICD-10-CM | POA: Diagnosis not present

## 2018-07-12 ENCOUNTER — Ambulatory Visit: Payer: Self-pay | Admitting: General Surgery

## 2018-07-12 NOTE — H&P (Signed)
Willette Alma Documented: 07/11/2018 10:26 AM Location: [facility] Patient #: [id number] DOB: 19-Jun-1955 Married / Language: Cleophus Molt / Race: White Female  History of Present Illness Randall Hiss M. Deshawn Skelley MD; 07/12/2018 9:39 AM) The patient is a 63 year old female who presents for evaluation of gallbladder disease. She is referred by Dr Penelope Coop for evaluation of right upper quadrant pain. She states that she has had stomach issues for many months ever since November and December. She states that her stomach will get upset after eating. She points to her epigastric and right upper quadrant and to her side. She will also have it radiates to her upper back as well. She may have some nausea occasionally with it as well as bloating. The increased discomfort will last for about an hour until it eases up to sort of her chronic discomfort that she has been having in her right upper abdomen. She denies any fever or chills or vomiting. She has had 2 C-sections and hysterectomy. She uses CPAP. She does not carry a diagnosis of multiple sclerosis. She just has optic neuritis. She denies any acholic stools. She denies any weight loss. She had an ultrasound which was negative for gallstones. She had a nuclear medicine scan which revealed an abnormal ejection fraction of her gallbladder. She cares for her husband who is homebound   Problem List/Past Medical Randall Hiss M. Redmond Pulling, MD; 07/12/2018 8:41 AM) BILIARY COLIC (Y60.63) HISTORY OF DVT IN ADULTHOOD (K16.010)  Past Surgical History Emeline Gins, [place]; 07/11/2018 10:27 AM) Breast Mass; Local Excision Left. Cesarean Section - Multiple Hysterectomy (not due to cancer) - Partial Knee Surgery Bilateral. Sentinel Lymph Node Biopsy  Diagnostic Studies History Emeline Gins, [place]; 07/11/2018 10:27 AM) Mammogram 1-3 years ago Pap Smear >5 years ago  Allergies Emeline Gins, CMA; 07/11/2018 10:28 AM) Adderall  *ADHD/ANTI-NARCOLEPSY/ANTI-OBESITY/ANOREXIANTS* Allergies Reconciled  Medication History Emeline Gins, CMA; 07/11/2018 10:30 AM) Amantadine HCl (100MG  Capsule, Oral) Active. Aspirin (81MG  Tablet, Oral) Active. FLUoxetine HCl (20MG  Capsule, Oral) Active. Gabapentin (300MG  Capsule, Oral) Active. hydroCHLOROthiazide (12.5MG  Tablet, Oral) Active. Levothyroxine Sodium (150MCG Tablet, Oral) Active. Omeprazole (40MG  Capsule DR, Oral) Active. Oxybutynin Chloride (5MG  Tablet, Oral) Active. Simvastatin (10MG  Tablet, Oral) Active. SUMAtriptan Succinate (100MG  Tablet, Oral) Active. traMADol HCl (50MG  Tablet, Oral) Active. raNITIdine HCl (150MG  Tablet, Oral) Active. rOPINIRole HCl (3MG  Tablet, Oral) Active. Medications Reconciled  Social History Emeline Gins, [place]; 07/11/2018 10:27 AM) Alcohol use Occasional alcohol use. Caffeine use Carbonated beverages, Coffee. No drug use Tobacco use Former smoker.  Family History Emeline Gins, [place]; 07/11/2018 10:27 AM) Arthritis Mother, Sister. Colon Polyps Sister. Diabetes Mellitus Sister. Heart Disease Father. Hypertension Mother. Migraine Headache Mother. Thyroid problems Sister.  Pregnancy / Birth History Emeline Gins, [place]; 07/11/2018 10:27 AM) Age at menarche 24 years. Contraceptive History Contraceptive implant, Oral contraceptives. Gravida 4 Irregular periods Length (months) of breastfeeding 3-6 Maternal age 43-25 Para 2  Other Problems Randall Hiss M. Redmond Pulling, MD; 07/12/2018 9:40 AM) Anxiety Disorder Back Pain Bladder Problems Diabetes Mellitus Gastroesophageal Reflux Disease Heart murmur High blood pressure Hypercholesterolemia Lump In Breast Migraine Headache Other disease, cancer, significant illness Pulmonary Embolism / Blood Clot in Legs Thyroid Disease Sleep Apnea     Review of Systems Randall Hiss M. Kealie Barrie MD; 07/12/2018 9:37 AM) General Present- Fatigue. Not Present-  Appetite Loss, Chills, Fever, Night Sweats, Weight Gain and Weight Loss. Skin Not Present- Change in Wart/Mole, Dryness, Hives, Jaundice, New Lesions, Non-Healing Wounds, Rash and Ulcer. HEENT Present- Hoarseness, Visual Disturbances and Wears glasses/contact lenses. Not Present- Earache, Hearing  Loss, Nose Bleed, Oral Ulcers, Ringing in the Ears, Seasonal Allergies, Sinus Pain, Sore Throat and Yellow Eyes. Respiratory Present- Chronic Cough and Snoring. Not Present- Bloody sputum, Difficulty Breathing and Wheezing. Breast Not Present- Breast Mass, Breast Pain, Nipple Discharge and Skin Changes. Cardiovascular Present- Leg Cramps, Shortness of Breath and Swelling of Extremities. Not Present- Chest Pain, Difficulty Breathing Lying Down, Palpitations and Rapid Heart Rate. Gastrointestinal Present- Abdominal Pain, Bloating, Change in Bowel Habits, Constipation, Difficulty Swallowing, Nausea and Rectal Pain. Not Present- Bloody Stool, Chronic diarrhea, Excessive gas, Gets full quickly at meals, Hemorrhoids, Indigestion and Vomiting. Female Genitourinary Present- Frequency. Not Present- Nocturia, Painful Urination, Pelvic Pain and Urgency. Musculoskeletal Present- Back Pain, Joint Pain, Joint Stiffness, Muscle Pain, Muscle Weakness and Swelling of Extremities. Neurological Present- Decreased Memory, Headaches, Numbness, Trouble walking and Weakness. Not Present- Fainting, Seizures, Tingling and Tremor. Psychiatric Present- Anxiety. Not Present- Bipolar, Change in Sleep Pattern, Depression, Fearful and Frequent crying. Endocrine Present- Heat Intolerance, Hot flashes and New Diabetes. Not Present- Cold Intolerance, Excessive Hunger and Hair Changes. Hematology Present- Easy Bruising and Gland problems. Not Present- Blood Thinners, Excessive bleeding, HIV and Persistent Infections. All other systems negative  Vitals Emeline Gins CMA; 07/11/2018 10:28 AM) 07/11/2018 10:27 AM Weight: 279.8 lb Height:  66in Body Surface Area: 2.31 m Body Mass Index: 45.16 kg/m  Temp.: 98.57F  Pulse: 97 (Regular)  BP: 134/76 (Sitting, Left Arm, Standard)        Physical Exam Randall Hiss M. Shataria Crist MD; 07/12/2018 9:37 AM)  General Mental Status-Alert. General Appearance-Consistent with stated age. Hydration-Well hydrated. Voice-Normal. Note: severe obesity  Head and Neck Head-normocephalic, atraumatic with no lesions or palpable masses. Trachea-midline. Thyroid Gland Characteristics - normal size and consistency.  Eye Eyeball - Bilateral-Extraocular movements intact. Sclera/Conjunctiva - Bilateral-No scleral icterus.  ENMT Ears Pinna - Bilateral - no bony growth in lateral aspect of ear canal, no drainage observed. Nose and Sinuses External Inspection of the Nose - symmetric, no deformities observed.  Chest and Lung Exam Chest and lung exam reveals -quiet, even and easy respiratory effort with no use of accessory muscles and on auscultation, normal breath sounds, no adventitious sounds and normal vocal resonance. Inspection Chest Wall - Normal. Back - normal.  Breast - Did not examine.  Cardiovascular Cardiovascular examination reveals -normal heart sounds, regular rate and rhythm with no murmurs and normal pedal pulses bilaterally.  Abdomen Inspection Inspection of the abdomen reveals - No Hernias. Skin - Scar - no surgical scars. Palpation/Percussion Palpation and Percussion of the abdomen reveal - Soft, Non Tender, No Rebound tenderness, No Rigidity (guarding) and No hepatosplenomegaly. Auscultation Auscultation of the abdomen reveals - Bowel sounds normal.  Peripheral Vascular Upper Extremity Palpation - Pulses bilaterally normal.  Neurologic Neurologic evaluation reveals -alert and oriented x 3 with no impairment of recent or remote memory. Mental Status-Normal.  Neuropsychiatric The patient's mood and affect are described as  -normal. Judgment and Insight-insight is appropriate concerning matters relevant to self.  Musculoskeletal Normal Exam - Left-Upper Extremity Strength Normal and Lower Extremity Strength Normal. Normal Exam - Right-Upper Extremity Strength Normal and Lower Extremity Strength Normal.  Lymphatic Head & Neck  General Head & Neck Lymphatics: Bilateral - Description - Normal. Axillary - Did not examine. Femoral & Inguinal - Did not examine.    Assessment & Plan Randall Hiss M. January Bergthold MD; 07/12/2018 9:38 AM)  BILIARY COLIC (H82.99) Impression: I believe the patient's symptoms are consistent with gallbladder disease.  We discussed gallbladder disease. The patient was given educational  material. We discussed non-operative and operative management. We discussed the signs & symptoms of acute cholecystitis  I discussed laparoscopic cholecystectomy with IOC in detail. The patient was given educational material as well as diagrams detailing the procedure. We discussed the risks and benefits of a laparoscopic cholecystectomy including, but not limited to bleeding, infection, injury to surrounding structures such as the intestine or liver, bile leak, retained gallstones, need to convert to an open procedure, prolonged diarrhea, blood clots such as DVT, common bile duct injury, anesthesia risks, and possible need for additional procedures. We discussed the typical post-operative recovery course. I explained that the likelihood of improvement of their symptoms is good.  The patient has elected to proceed with surgery.  Current Plans Pt Education - Pamphlet Given - Laparoscopic Gallbladder Surgery: discussed with patient and provided information. You are being scheduled for surgery- Our schedulers will call you.  You should hear from our office's scheduling department within 5 working days about the location, date, and time of surgery. We try to make accommodations for patient's preferences in  scheduling surgery, but sometimes the OR schedule or the surgeon's schedule prevents [place] from making those accommodations.  If you have not heard from our office ([phone]) in 5 working days, call the office and ask for your surgeon's nurse.  If you have other questions about your diagnosis, plan, or surgery, call the office and ask for your surgeon's nurse.   HISTORY OF DVT IN ADULTHOOD (V89.381) Impression: We did discuss the since she has had a history of a lower extremity DVT to that places her at increased risk for recurrent blood clots. We will give her subcutaneous heparin preoperatively.   OBSTRUCTIVE SLEEP APNEA ON CPAP (G47.33)  Leighton Ruff. Redmond Pulling, MD, FACS General, Bariatric, & Minimally Invasive Surgery [facility], [place]

## 2018-07-19 DIAGNOSIS — M25562 Pain in left knee: Secondary | ICD-10-CM | POA: Diagnosis not present

## 2018-07-19 DIAGNOSIS — M25561 Pain in right knee: Secondary | ICD-10-CM | POA: Diagnosis not present

## 2018-07-19 DIAGNOSIS — G8929 Other chronic pain: Secondary | ICD-10-CM | POA: Diagnosis not present

## 2018-08-01 DIAGNOSIS — G4733 Obstructive sleep apnea (adult) (pediatric): Secondary | ICD-10-CM | POA: Diagnosis not present

## 2018-08-13 ENCOUNTER — Encounter (HOSPITAL_COMMUNITY): Payer: Self-pay

## 2018-08-13 ENCOUNTER — Other Ambulatory Visit (HOSPITAL_COMMUNITY)
Admission: RE | Admit: 2018-08-13 | Discharge: 2018-08-13 | Disposition: A | Discharge status: Home / Self Care | Payer: PPO | Source: Ambulatory Visit | Attending: General Surgery | Admitting: General Surgery

## 2018-08-13 DIAGNOSIS — Z1159 Encounter for screening for other viral diseases: Secondary | ICD-10-CM | POA: Insufficient documentation

## 2018-08-13 LAB — SARS CORONAVIRUS 2 (TAT 6-24 HRS): SARS Coronavirus 2: NEGATIVE

## 2018-08-13 NOTE — Patient Instructions (Addendum)
DUE TO COVID-19 ONLY ONE VISITOR IS ALLOWED IN THE HOSPITAL AT THIS TIME   COVID SWAB TESTING HAS BEEN COMPLETED (Must self quarantine after testing. Follow instructions on handout.)   Your procedure is scheduled on: Thursday, August 16, 2018     Report to [facility] Main  Entrance   Report to Short Stay at 5:30 AM   Call this number if you have problems the morning of surgery [phone]   Bring CPAP mask and tubing day of surgery   Do not eat food or drink liquids :After Midnight.   Brush your teeth the morning of surgery.    Take these medicines the morning of surgery with A SIP OF WATER: Amantadine, Gabapentin, Levothyroxine, Omeprazole                               You may not have any metal on your body including hair pins, jewelry, and body piercings             Do not wear make-up, lotions, powders, perfumes/cologne, or deodorant             Do not wear nail polish.  Do not shave  48 hours prior to surgery.               Do not bring valuables to the hospital. [place].   Contacts, dentures or bridgework may not be worn into surgery.   Patients discharged the day of surgery will not be allowed to drive home.   Name and phone number of your driver:   Special Instructions: N/A              Please read over the following fact sheets you were given:  [facility] - Preparing for Surgery Before surgery, you can play an important role.  Because skin is not sterile, your skin needs to be as free of germs as possible.  You can reduce the number of germs on your skin by washing with CHG (chlorahexidine gluconate) soap before surgery.  CHG is an antiseptic cleaner which kills germs and bonds with the skin to continue killing germs even after washing. Please DO NOT use if you have an allergy to CHG or antibacterial soaps.  If your skin becomes reddened/irritated stop using the CHG and inform your nurse when you arrive  at Short Stay. Do not shave (including legs and underarms) for at least 48 hours prior to the first CHG shower.  You may shave your face/neck.  Please follow these instructions carefully:  1.  Shower with CHG Soap the night before surgery and the  morning of surgery.  2.  If you choose to wash your hair, wash your hair first as usual with your normal  shampoo.  3.  After you shampoo, rinse your hair and body thoroughly to remove the shampoo.                             4.  Use CHG as you would any other liquid soap.  You can apply chg directly to the skin and wash.  Gently with a scrungie or clean washcloth.  5.  Apply the CHG Soap to your body ONLY FROM THE NECK DOWN.   Do   not use on face/  open                           Wound or open sores. Avoid contact with eyes, ears mouth and   genitals (private parts).                       Wash face,  Genitals (private parts) with your normal soap.             6.  Wash thoroughly, paying special attention to the area where your    surgery  will be performed.  7.  Thoroughly rinse your body with warm water from the neck down.  8.  DO NOT shower/wash with your normal soap after using and rinsing off the CHG Soap.                9.  Pat yourself dry with a clean towel.            10.  Wear clean pajamas.            11.  Place clean sheets on your bed the night of your first shower and do not  sleep with pets. Day of Surgery : Do not apply any lotions/deodorants the morning of surgery.  Please wear clean clothes to the hospital/surgery center.  FAILURE TO FOLLOW THESE INSTRUCTIONS MAY RESULT IN THE CANCELLATION OF YOUR SURGERY  PATIENT SIGNATURE_________________________________  NURSE SIGNATURE__________________________________  ________________________________________________________________________

## 2018-08-14 ENCOUNTER — Encounter (HOSPITAL_COMMUNITY): Payer: Self-pay

## 2018-08-14 ENCOUNTER — Encounter (HOSPITAL_COMMUNITY)
Admission: RE | Admit: 2018-08-14 | Discharge: 2018-08-14 | Disposition: A | Discharge status: Home / Self Care | Payer: PPO | Source: Ambulatory Visit | Attending: General Surgery | Admitting: General Surgery

## 2018-08-14 ENCOUNTER — Other Ambulatory Visit: Payer: Self-pay

## 2018-08-14 DIAGNOSIS — I10 Essential (primary) hypertension: Secondary | ICD-10-CM | POA: Insufficient documentation

## 2018-08-14 DIAGNOSIS — Z01818 Encounter for other preprocedural examination: Secondary | ICD-10-CM | POA: Insufficient documentation

## 2018-08-14 DIAGNOSIS — K8050 Calculus of bile duct without cholangitis or cholecystitis without obstruction: Secondary | ICD-10-CM | POA: Insufficient documentation

## 2018-08-14 DIAGNOSIS — K811 Chronic cholecystitis: Secondary | ICD-10-CM | POA: Diagnosis not present

## 2018-08-14 DIAGNOSIS — Z87891 Personal history of nicotine dependence: Secondary | ICD-10-CM | POA: Diagnosis not present

## 2018-08-14 DIAGNOSIS — Z79899 Other long term (current) drug therapy: Secondary | ICD-10-CM | POA: Insufficient documentation

## 2018-08-14 DIAGNOSIS — G43909 Migraine, unspecified, not intractable, without status migrainosus: Secondary | ICD-10-CM | POA: Diagnosis not present

## 2018-08-14 DIAGNOSIS — Z86711 Personal history of pulmonary embolism: Secondary | ICD-10-CM | POA: Diagnosis not present

## 2018-08-14 DIAGNOSIS — Z79891 Long term (current) use of opiate analgesic: Secondary | ICD-10-CM | POA: Diagnosis not present

## 2018-08-14 DIAGNOSIS — E119 Type 2 diabetes mellitus without complications: Secondary | ICD-10-CM | POA: Insufficient documentation

## 2018-08-14 DIAGNOSIS — K219 Gastro-esophageal reflux disease without esophagitis: Secondary | ICD-10-CM | POA: Insufficient documentation

## 2018-08-14 DIAGNOSIS — R1011 Right upper quadrant pain: Secondary | ICD-10-CM | POA: Diagnosis present

## 2018-08-14 DIAGNOSIS — E7800 Pure hypercholesterolemia, unspecified: Secondary | ICD-10-CM | POA: Diagnosis not present

## 2018-08-14 DIAGNOSIS — E039 Hypothyroidism, unspecified: Secondary | ICD-10-CM | POA: Insufficient documentation

## 2018-08-14 DIAGNOSIS — Z6841 Body Mass Index (BMI) 40.0 and over, adult: Secondary | ICD-10-CM | POA: Diagnosis not present

## 2018-08-14 DIAGNOSIS — F419 Anxiety disorder, unspecified: Secondary | ICD-10-CM | POA: Diagnosis not present

## 2018-08-14 DIAGNOSIS — Z79890 Hormone replacement therapy (postmenopausal): Secondary | ICD-10-CM | POA: Insufficient documentation

## 2018-08-14 DIAGNOSIS — Z86718 Personal history of other venous thrombosis and embolism: Secondary | ICD-10-CM | POA: Insufficient documentation

## 2018-08-14 DIAGNOSIS — G4733 Obstructive sleep apnea (adult) (pediatric): Secondary | ICD-10-CM | POA: Insufficient documentation

## 2018-08-14 DIAGNOSIS — Z7982 Long term (current) use of aspirin: Secondary | ICD-10-CM | POA: Insufficient documentation

## 2018-08-14 HISTORY — DX: Other specified behavioral and emotional disorders with onset usually occurring in childhood and adolescence: F98.8

## 2018-08-14 HISTORY — DX: Unspecified urinary incontinence: R32

## 2018-08-14 HISTORY — DX: Unspecified optic neuritis: H46.9

## 2018-08-14 HISTORY — DX: Reserved for inherently not codable concepts without codable children: IMO0001

## 2018-08-14 HISTORY — DX: Calculus of bile duct without cholangitis or cholecystitis without obstruction: K80.50

## 2018-08-14 HISTORY — DX: Essential (primary) hypertension: I10

## 2018-08-14 HISTORY — DX: Cramp and spasm: R25.2

## 2018-08-14 HISTORY — DX: Migraine, unspecified, not intractable, without status migrainosus: G43.909

## 2018-08-14 HISTORY — DX: Acute embolism and thrombosis of unspecified deep veins of unspecified lower extremity: I82.409

## 2018-08-14 LAB — CBC
HCT: 39.7 % (ref 36.0–46.0)
Hemoglobin: 12.3 g/dL (ref 12.0–15.0)
MCH: 27.2 pg (ref 26.0–34.0)
MCHC: 31 g/dL (ref 30.0–36.0)
MCV: 87.8 fL (ref 80.0–100.0)
Platelets: 287 10*3/uL (ref 150–400)
RBC: 4.52 MIL/uL (ref 3.87–5.11)
RDW: 14.2 % (ref 11.5–15.5)
WBC: 6.5 10*3/uL (ref 4.0–10.5)
nRBC: 0 % (ref 0.0–0.2)

## 2018-08-14 LAB — COMPREHENSIVE METABOLIC PANEL
ALT: 17 U/L (ref 0–44)
AST: 16 U/L (ref 15–41)
Albumin: 3.8 g/dL (ref 3.5–5.0)
Alkaline Phosphatase: 83 U/L (ref 38–126)
Anion gap: 9 (ref 5–15)
BUN: 17 mg/dL (ref 6–20)
CO2: 28 mmol/L (ref 22–32)
Calcium: 8.9 mg/dL (ref 8.9–10.3)
Chloride: 103 mmol/L (ref 98–111)
Creatinine, Ser: 1 mg/dL (ref 0.44–1.00)
GFR calc Af Amer: >60 mL/min (ref >60)
GFR calc non Af Amer: >60 mL/min (ref >60)
Glucose, Bld: 128 mg/dL — ABNORMAL HIGH (ref 70–99)
Potassium: 4.9 mmol/L (ref 3.5–5.1)
Sodium: 140 mmol/L (ref 135–145)
Total Bilirubin: 0.4 mg/dL (ref 0.3–1.2)
Total Protein: 7 g/dL (ref 6.5–8.1)

## 2018-08-14 LAB — HEMOGLOBIN A1C
Hgb A1c MFr Bld: 6 % — ABNORMAL HIGH (ref 4.8–5.6)
Mean Plasma Glucose: 125.5 mg/dL

## 2018-08-14 LAB — GLUCOSE, CAPILLARY: Glucose-Capillary: 139 mg/dL — ABNORMAL HIGH (ref 70–99)

## 2018-08-15 NOTE — H&P (Signed)
Anna Walker Documented: 07/11/2018 10:26 AM Location: [facility] Patient #: [id number] DOB: 11/09/1955 Married / Language: Cleophus Molt / Race: White Female   History of Present Illness Randall Hiss M. Mignonne Afonso MD; 07/12/2018 9:39 AM) The patient is a 63 year old female who presents for evaluation of gallbladder disease. She is referred by Dr Penelope Coop for evaluation of right upper quadrant pain. She states that she has had stomach issues for many months ever since November and December. She states that her stomach will get upset after eating. She points to her epigastric and right upper quadrant and to her side. She will also have it radiates to her upper back as well. She may have some nausea occasionally with it as well as bloating. The increased discomfort will last for about an hour until it eases up to sort of her chronic discomfort that she has been having in her right upper abdomen. She denies any fever or chills or vomiting. She has had 2 C-sections and hysterectomy. She uses CPAP. She does not carry a diagnosis of multiple sclerosis. She just has optic neuritis. She denies any acholic stools. She denies any weight loss. She had an ultrasound which was negative for gallstones. She had a nuclear medicine scan which revealed an abnormal ejection fraction of her gallbladder. She cares for her husband who is homebound   Problem List/Past Medical Randall Hiss M. Redmond Pulling, MD; 07/12/2018 3:71 AM) BILIARY COLIC (G62.69)  HISTORY OF DVT IN ADULTHOOD (S85.462)   Past Surgical History Emeline Gins, [place]; 07/11/2018 10:27 AM) Breast Mass; Local Excision  Left. Cesarean Section - Multiple  Hysterectomy (not due to cancer) - Partial  Knee Surgery  Bilateral. Sentinel Lymph Node Biopsy   Diagnostic Studies History Emeline Gins, [place]; 07/11/2018 10:27 AM) Mammogram  1-3 years ago Pap Smear  >5 years ago  Allergies Emeline Gins, CMA; 07/11/2018 10:28 AM) Adderall  *ADHD/ANTI-NARCOLEPSY/ANTI-OBESITY/ANOREXIANTS*  Allergies Reconciled   Medication History Emeline Gins, CMA; 07/11/2018 10:30 AM) Amantadine HCl (100MG  Capsule, Oral) Active. Aspirin (81MG  Tablet, Oral) Active. FLUoxetine HCl (20MG  Capsule, Oral) Active. Gabapentin (300MG  Capsule, Oral) Active. hydroCHLOROthiazide (12.5MG  Tablet, Oral) Active. Levothyroxine Sodium (150MCG Tablet, Oral) Active. Omeprazole (40MG  Capsule DR, Oral) Active. Oxybutynin Chloride (5MG  Tablet, Oral) Active. Simvastatin (10MG  Tablet, Oral) Active. SUMAtriptan Succinate (100MG  Tablet, Oral) Active. traMADol HCl (50MG  Tablet, Oral) Active. raNITIdine HCl (150MG  Tablet, Oral) Active. rOPINIRole HCl (3MG  Tablet, Oral) Active. Medications Reconciled  Social History Emeline Gins, [place]; 07/11/2018 10:27 AM) Alcohol use  Occasional alcohol use. Caffeine use  Carbonated beverages, Coffee. No drug use  Tobacco use  Former smoker.  Family History Emeline Gins, [place]; 07/11/2018 10:27 AM) Arthritis  Mother, Sister. Colon Polyps  Sister. Diabetes Mellitus  Sister. Heart Disease  Father. Hypertension  Mother. Migraine Headache  Mother. Thyroid problems  Sister.  Pregnancy / Birth History Emeline Gins, [place]; 07/11/2018 10:27 AM) Age at menarche  46 years. Contraceptive History  Contraceptive implant, Oral contraceptives. Gravida  4 Irregular periods  Length (months) of breastfeeding  3-6 Maternal age  64-25 Para  2  Other Problems Randall Hiss M. Redmond Pulling, MD; 07/12/2018 9:40 AM) Anxiety Disorder  Back Pain  Bladder Problems  Diabetes Mellitus  Gastroesophageal Reflux Disease  Heart murmur  High blood pressure  Hypercholesterolemia  Lump In Breast  Migraine Headache  Other disease, cancer, significant illness  Pulmonary Embolism / Blood Clot in Legs  Thyroid Disease  Sleep Apnea     Review of Systems Randall Hiss M. Yaslene Lindamood MD; 07/12/2018 9:37 AM) General  Present-  Fatigue. Not Present- Appetite Loss, Chills, Fever, Night Sweats, Weight Gain and Weight Loss. Skin Not Present- Change in Wart/Mole, Dryness, Hives, Jaundice, New Lesions, Non-Healing Wounds, Rash and Ulcer. HEENT Present- Hoarseness, Visual Disturbances and Wears glasses/contact lenses. Not Present- Earache, Hearing Loss, Nose Bleed, Oral Ulcers, Ringing in the Ears, Seasonal Allergies, Sinus Pain, Sore Throat and Yellow Eyes. Respiratory Present- Chronic Cough and Snoring. Not Present- Bloody sputum, Difficulty Breathing and Wheezing. Breast Not Present- Breast Mass, Breast Pain, Nipple Discharge and Skin Changes. Cardiovascular Present- Leg Cramps, Shortness of Breath and Swelling of Extremities. Not Present- Chest Pain, Difficulty Breathing Lying Down, Palpitations and Rapid Heart Rate. Gastrointestinal Present- Abdominal Pain, Bloating, Change in Bowel Habits, Constipation, Difficulty Swallowing, Nausea and Rectal Pain. Not Present- Bloody Stool, Chronic diarrhea, Excessive gas, Gets full quickly at meals, Hemorrhoids, Indigestion and Vomiting. Female Genitourinary Present- Frequency. Not Present- Nocturia, Painful Urination, Pelvic Pain and Urgency. Musculoskeletal Present- Back Pain, Joint Pain, Joint Stiffness, Muscle Pain, Muscle Weakness and Swelling of Extremities. Neurological Present- Decreased Memory, Headaches, Numbness, Trouble walking and Weakness. Not Present- Fainting, Seizures, Tingling and Tremor. Psychiatric Present- Anxiety. Not Present- Bipolar, Change in Sleep Pattern, Depression, Fearful and Frequent crying. Endocrine Present- Heat Intolerance, Hot flashes and New Diabetes. Not Present- Cold Intolerance, Excessive Hunger and Hair Changes. Hematology Present- Easy Bruising and Gland problems. Not Present- Blood Thinners, Excessive bleeding, HIV and Persistent Infections. All other systems negative  Vitals Emeline Gins CMA; 07/11/2018 10:28 AM) 07/11/2018 10:27  AM Weight: 279.8 lb Height: 66in Body Surface Area: 2.31 m Body Mass Index: 45.16 kg/m  Temp.: 98.58F  Pulse: 97 (Regular)  BP: 134/76(Sitting, Left Arm, Standard)       Physical Exam Randall Hiss M. Brondon Wann MD; 07/12/2018 9:37 AM) General Mental Status-Alert. General Appearance-Consistent with stated age. Hydration-Well hydrated. Voice-Normal. Note: severe obesity   Head and Neck Head-normocephalic, atraumatic with no lesions or palpable masses. Trachea-midline. Thyroid Gland Characteristics - normal size and consistency.  Eye Eyeball - Bilateral-Extraocular movements intact. Sclera/Conjunctiva - Bilateral-No scleral icterus.  ENMT Ears Pinna - Bilateral - no bony growth in lateral aspect of ear canal, no drainage observed. Nose and Sinuses External Inspection of the Nose - symmetric, no deformities observed.  Chest and Lung Exam Chest and lung exam reveals -quiet, even and easy respiratory effort with no use of accessory muscles and on auscultation, normal breath sounds, no adventitious sounds and normal vocal resonance. Inspection Chest Wall - Normal. Back - normal.  Breast - Did not examine.  Cardiovascular Cardiovascular examination reveals -normal heart sounds, regular rate and rhythm with no murmurs and normal pedal pulses bilaterally.  Abdomen Inspection Inspection of the abdomen reveals - No Hernias. Skin - Scar - no surgical scars. Palpation/Percussion Palpation and Percussion of the abdomen reveal - Soft, Non Tender, No Rebound tenderness, No Rigidity (guarding) and No hepatosplenomegaly. Auscultation Auscultation of the abdomen reveals - Bowel sounds normal.  Peripheral Vascular Upper Extremity Palpation - Pulses bilaterally normal.  Neurologic Neurologic evaluation reveals -alert and oriented x 3 with no impairment of recent or remote memory. Mental Status-Normal.  Neuropsychiatric The patient's mood and affect  are described as -normal. Judgment and Insight-insight is appropriate concerning matters relevant to self.  Musculoskeletal Normal Exam - Left-Upper Extremity Strength Normal and Lower Extremity Strength Normal. Normal Exam - Right-Upper Extremity Strength Normal and Lower Extremity Strength Normal.  Lymphatic Head & Neck  General Head & Neck Lymphatics: Bilateral - Description - Normal. Axillary - Did not examine. Femoral & Inguinal -  Did not examine.    Assessment & Plan Randall Hiss M. Darrin Koman MD; 07/12/2018 4:01 AM)  BILIARY COLIC (U27.25) Impression: I believe the patient's symptoms are consistent with gallbladder disease.  We discussed gallbladder disease. The patient was given Neurosurgeon. We discussed non-operative and operative management. We discussed the signs & symptoms of acute cholecystitis  I discussed laparoscopic cholecystectomy with IOC in detail. The patient was given educational material as well as diagrams detailing the procedure. We discussed the risks and benefits of a laparoscopic cholecystectomy including, but not limited to bleeding, infection, injury to surrounding structures such as the intestine or liver, bile leak, retained gallstones, need to convert to an open procedure, prolonged diarrhea, blood clots such as DVT, common bile duct injury, anesthesia risks, and possible need for additional procedures. We discussed the typical post-operative recovery course. I explained that the likelihood of improvement of their symptoms is good.  The patient has elected to proceed with surgery.  Current Plans Pt Education - Pamphlet Given - Laparoscopic Gallbladder Surgery: discussed with patient and provided information. You are being scheduled for surgery- Our schedulers will call you.  You should hear from our office's scheduling department within 5 working days about the location, date, and time of surgery. We try to make accommodations for patient's  preferences in scheduling surgery, but sometimes the OR schedule or the surgeon's schedule prevents [place] from making those accommodations.  If you have not heard from our office [phone]) in 5 working days, call the office and ask for your surgeon's nurse.  If you have other questions about your diagnosis, plan, or surgery, call the office and ask for your surgeon's nurse.   HISTORY OF DVT IN ADULTHOOD (Q59.563) Impression: We did discuss the since she has had a history of a lower extremity DVT to that places her at increased risk for recurrent blood clots. We will give her subcutaneous heparin preoperatively.   OBSTRUCTIVE SLEEP APNEA ON CPAP (G47.33)  Leighton Ruff. Redmond Pulling, MD, FACS General, Bariatric, & Minimally Invasive Surgery [facility], [place]

## 2018-08-16 ENCOUNTER — Encounter (HOSPITAL_COMMUNITY): Payer: Self-pay | Admitting: Emergency Medicine

## 2018-08-16 ENCOUNTER — Other Ambulatory Visit: Payer: Self-pay

## 2018-08-16 ENCOUNTER — Ambulatory Visit (HOSPITAL_COMMUNITY): Payer: PPO | Admitting: Physician Assistant

## 2018-08-16 ENCOUNTER — Encounter (HOSPITAL_COMMUNITY)
Admission: RE | Disposition: A | Discharge status: Home / Self Care | Payer: Self-pay | Source: Other Acute Inpatient Hospital | Attending: General Surgery

## 2018-08-16 ENCOUNTER — Ambulatory Visit (HOSPITAL_COMMUNITY)
Admission: RE | Admit: 2018-08-16 | Discharge: 2018-08-16 | Disposition: A | Discharge status: Home / Self Care | Payer: PPO | Source: Other Acute Inpatient Hospital | Attending: General Surgery | Admitting: General Surgery

## 2018-08-16 ENCOUNTER — Ambulatory Visit (HOSPITAL_COMMUNITY): Payer: PPO | Admitting: Anesthesiology

## 2018-08-16 DIAGNOSIS — K811 Chronic cholecystitis: Secondary | ICD-10-CM | POA: Insufficient documentation

## 2018-08-16 DIAGNOSIS — E119 Type 2 diabetes mellitus without complications: Secondary | ICD-10-CM | POA: Insufficient documentation

## 2018-08-16 DIAGNOSIS — G4733 Obstructive sleep apnea (adult) (pediatric): Secondary | ICD-10-CM | POA: Insufficient documentation

## 2018-08-16 DIAGNOSIS — G43909 Migraine, unspecified, not intractable, without status migrainosus: Secondary | ICD-10-CM | POA: Diagnosis not present

## 2018-08-16 DIAGNOSIS — Z86718 Personal history of other venous thrombosis and embolism: Secondary | ICD-10-CM | POA: Insufficient documentation

## 2018-08-16 DIAGNOSIS — F419 Anxiety disorder, unspecified: Secondary | ICD-10-CM | POA: Insufficient documentation

## 2018-08-16 DIAGNOSIS — Z86711 Personal history of pulmonary embolism: Secondary | ICD-10-CM | POA: Insufficient documentation

## 2018-08-16 DIAGNOSIS — Z87891 Personal history of nicotine dependence: Secondary | ICD-10-CM | POA: Insufficient documentation

## 2018-08-16 DIAGNOSIS — K8050 Calculus of bile duct without cholangitis or cholecystitis without obstruction: Secondary | ICD-10-CM | POA: Diagnosis not present

## 2018-08-16 DIAGNOSIS — Z6841 Body Mass Index (BMI) 40.0 and over, adult: Secondary | ICD-10-CM | POA: Insufficient documentation

## 2018-08-16 DIAGNOSIS — Z7982 Long term (current) use of aspirin: Secondary | ICD-10-CM | POA: Insufficient documentation

## 2018-08-16 DIAGNOSIS — I10 Essential (primary) hypertension: Secondary | ICD-10-CM | POA: Diagnosis not present

## 2018-08-16 DIAGNOSIS — Z79899 Other long term (current) drug therapy: Secondary | ICD-10-CM | POA: Insufficient documentation

## 2018-08-16 DIAGNOSIS — E7800 Pure hypercholesterolemia, unspecified: Secondary | ICD-10-CM | POA: Insufficient documentation

## 2018-08-16 DIAGNOSIS — Z79890 Hormone replacement therapy (postmenopausal): Secondary | ICD-10-CM | POA: Insufficient documentation

## 2018-08-16 DIAGNOSIS — Z79891 Long term (current) use of opiate analgesic: Secondary | ICD-10-CM | POA: Insufficient documentation

## 2018-08-16 DIAGNOSIS — K219 Gastro-esophageal reflux disease without esophagitis: Secondary | ICD-10-CM | POA: Insufficient documentation

## 2018-08-16 DIAGNOSIS — E039 Hypothyroidism, unspecified: Secondary | ICD-10-CM | POA: Diagnosis not present

## 2018-08-16 HISTORY — PX: CHOLECYSTECTOMY: SHX55

## 2018-08-16 LAB — GLUCOSE, CAPILLARY
Glucose-Capillary: 98 mg/dL (ref 70–99)
Glucose-Capillary: 163 mg/dL — ABNORMAL HIGH (ref 70–99)

## 2018-08-16 SURGERY — LAPAROSCOPIC CHOLECYSTECTOMY
Anesthesia: General | Site: Abdomen

## 2018-08-16 MED ORDER — ROCURONIUM BROMIDE 10 MG/ML (PF) SYRINGE
PREFILLED_SYRINGE | INTRAVENOUS | Status: DC | PRN
  Administered 2018-08-16: 60 mg via INTRAVENOUS

## 2018-08-16 MED ORDER — ONDANSETRON HCL 4 MG/2ML IJ SOLN
INTRAMUSCULAR | Status: DC | PRN
  Administered 2018-08-16: 4 mg via INTRAVENOUS

## 2018-08-16 MED ORDER — ACETAMINOPHEN 160 MG/5ML PO SOLN: 1000.0000 mg | Freq: Once | ORAL | Status: DC | PRN

## 2018-08-16 MED ORDER — ONDANSETRON HCL 4 MG/2ML IJ SOLN
INTRAMUSCULAR | Status: AC
  Filled 2018-08-16: qty 2

## 2018-08-16 MED ORDER — KETOROLAC TROMETHAMINE 30 MG/ML IJ SOLN
INTRAMUSCULAR | Status: AC
  Filled 2018-08-16: qty 1

## 2018-08-16 MED ORDER — FENTANYL CITRATE (PF) 250 MCG/5ML IJ SOLN
INTRAMUSCULAR | Status: AC
  Filled 2018-08-16: qty 5

## 2018-08-16 MED ORDER — KETOROLAC TROMETHAMINE 30 MG/ML IJ SOLN
INTRAMUSCULAR | Status: DC | PRN
  Administered 2018-08-16: 30 mg via INTRAVENOUS

## 2018-08-16 MED ORDER — CHLORHEXIDINE GLUCONATE CLOTH 2 % EX PADS: 6.0000 | MEDICATED_PAD | Freq: Once | CUTANEOUS | Status: DC

## 2018-08-16 MED ORDER — BUPIVACAINE-EPINEPHRINE (PF) 0.25% -1:200000 IJ SOLN
INTRAMUSCULAR | Status: AC
  Filled 2018-08-16: qty 30

## 2018-08-16 MED ORDER — ROCURONIUM BROMIDE 10 MG/ML (PF) SYRINGE
PREFILLED_SYRINGE | INTRAVENOUS | Status: AC
  Filled 2018-08-16: qty 10

## 2018-08-16 MED ORDER — PROPOFOL 10 MG/ML IV BOLUS
INTRAVENOUS | Status: AC
  Filled 2018-08-16: qty 20

## 2018-08-16 MED ORDER — ACETAMINOPHEN 500 MG PO TABS
1000.0000 mg | ORAL_TABLET | ORAL | Status: AC
  Administered 2018-08-16: 1000 mg via ORAL
  Filled 2018-08-16: qty 2

## 2018-08-16 MED ORDER — ACETAMINOPHEN 500 MG PO TABS: 1000.0000 mg | ORAL_TABLET | Freq: Once | ORAL | Status: DC | PRN

## 2018-08-16 MED ORDER — SUGAMMADEX SODIUM 500 MG/5ML IV SOLN
INTRAVENOUS | Status: AC
  Filled 2018-08-16: qty 5

## 2018-08-16 MED ORDER — DEXAMETHASONE SODIUM PHOSPHATE 10 MG/ML IJ SOLN
INTRAMUSCULAR | Status: DC | PRN
  Administered 2018-08-16: 10 mg via INTRAVENOUS

## 2018-08-16 MED ORDER — OXYCODONE HCL 5 MG PO TABS
5.0000 mg | ORAL_TABLET | Freq: Once | ORAL | Status: AC | PRN
  Administered 2018-08-16: 5 mg via ORAL

## 2018-08-16 MED ORDER — SODIUM CHLORIDE 0.9 % IV SOLN
2.0000 g | INTRAVENOUS | Status: AC
  Administered 2018-08-16: 2 g via INTRAVENOUS
  Filled 2018-08-16: qty 2

## 2018-08-16 MED ORDER — FENTANYL CITRATE (PF) 100 MCG/2ML IJ SOLN
25.0000 ug | INTRAMUSCULAR | Status: DC | PRN
  Administered 2018-08-16 (×2): 50 ug via INTRAVENOUS

## 2018-08-16 MED ORDER — FENTANYL CITRATE (PF) 250 MCG/5ML IJ SOLN
INTRAMUSCULAR | Status: DC | PRN
  Administered 2018-08-16: 100 ug via INTRAVENOUS
  Administered 2018-08-16: 50 ug via INTRAVENOUS

## 2018-08-16 MED ORDER — BUPIVACAINE-EPINEPHRINE 0.25% -1:200000 IJ SOLN
INTRAMUSCULAR | Status: DC | PRN
  Administered 2018-08-16: 30 mL

## 2018-08-16 MED ORDER — LIDOCAINE 20MG/ML (2%) 15 ML SYRINGE OPTIME
INTRAMUSCULAR | Status: DC | PRN
  Administered 2018-08-16: 1.5 mg/kg/h via INTRAVENOUS

## 2018-08-16 MED ORDER — 0.9 % SODIUM CHLORIDE (POUR BTL) OPTIME
TOPICAL | Status: DC | PRN
  Administered 2018-08-16: 1000 mL

## 2018-08-16 MED ORDER — LIDOCAINE HCL 2 % IJ SOLN
INTRAMUSCULAR | Status: AC
  Filled 2018-08-16: qty 20

## 2018-08-16 MED ORDER — PROPOFOL 10 MG/ML IV BOLUS
INTRAVENOUS | Status: DC | PRN
  Administered 2018-08-16: 200 mg via INTRAVENOUS

## 2018-08-16 MED ORDER — OXYCODONE HCL 5 MG PO TABS: 5.0000 mg | ORAL_TABLET | Freq: Four times a day (QID) | ORAL | 0 refills | Status: DC | PRN

## 2018-08-16 MED ORDER — OXYCODONE HCL 5 MG/5ML PO SOLN: 5.0000 mg | Freq: Once | ORAL | Status: AC | PRN

## 2018-08-16 MED ORDER — GABAPENTIN 300 MG PO CAPS
300.0000 mg | ORAL_CAPSULE | ORAL | Status: AC
  Administered 2018-08-16: 300 mg via ORAL
  Filled 2018-08-16: qty 1

## 2018-08-16 MED ORDER — FENTANYL CITRATE (PF) 100 MCG/2ML IJ SOLN
INTRAMUSCULAR | Status: AC
  Filled 2018-08-16: qty 2

## 2018-08-16 MED ORDER — MIDAZOLAM HCL 2 MG/2ML IJ SOLN
INTRAMUSCULAR | Status: AC
  Filled 2018-08-16: qty 2

## 2018-08-16 MED ORDER — LACTATED RINGERS IV SOLN
INTRAVENOUS | Status: DC
  Administered 2018-08-16 (×2): via INTRAVENOUS

## 2018-08-16 MED ORDER — DEXAMETHASONE SODIUM PHOSPHATE 10 MG/ML IJ SOLN
INTRAMUSCULAR | Status: AC
  Filled 2018-08-16: qty 1

## 2018-08-16 MED ORDER — ACETAMINOPHEN 10 MG/ML IV SOLN: 1000.0000 mg | Freq: Once | INTRAVENOUS | Status: DC | PRN

## 2018-08-16 MED ORDER — HEPARIN SODIUM (PORCINE) 5000 UNIT/ML IJ SOLN
5000.0000 [IU] | Freq: Once | INTRAMUSCULAR | Status: AC
  Administered 2018-08-16: 5000 [IU] via SUBCUTANEOUS
  Filled 2018-08-16: qty 1

## 2018-08-16 MED ORDER — SUGAMMADEX SODIUM 500 MG/5ML IV SOLN
INTRAVENOUS | Status: DC | PRN
  Administered 2018-08-16: 300 mg via INTRAVENOUS

## 2018-08-16 MED ORDER — LACTATED RINGERS IR SOLN
Status: DC | PRN
  Administered 2018-08-16: 1000 mL

## 2018-08-16 MED ORDER — OXYCODONE HCL 5 MG PO TABS
ORAL_TABLET | ORAL | Status: AC
  Filled 2018-08-16: qty 1

## 2018-08-16 MED ORDER — MIDAZOLAM HCL 5 MG/5ML IJ SOLN
INTRAMUSCULAR | Status: DC | PRN
  Administered 2018-08-16: 2 mg via INTRAVENOUS

## 2018-08-16 SURGICAL SUPPLY — 40 items
APPLIER CLIP 5 13 M/L LIGAMAX5 (MISCELLANEOUS)
APPLIER CLIP ROT 10 11.4 M/L (STAPLE)
BENZOIN TINCTURE PRP APPL 2/3 (GAUZE/BANDAGES/DRESSINGS) ×2 IMPLANT
BNDG ADH 1X3 SHEER STRL LF (GAUZE/BANDAGES/DRESSINGS) ×12 IMPLANT
CABLE HIGH FREQUENCY MONO STRZ (ELECTRODE) ×3 IMPLANT
CHLORAPREP W/TINT 26 (MISCELLANEOUS) ×3 IMPLANT
CLIP APPLIE 5 13 M/L LIGAMAX5 (MISCELLANEOUS) IMPLANT
CLIP APPLIE ROT 10 11.4 M/L (STAPLE) IMPLANT
CLIP VESOLOCK MED LG 6/CT (CLIP) IMPLANT
CLOSURE WOUND 1/2 X4 (GAUZE/BANDAGES/DRESSINGS) ×1
COVER SURGICAL LIGHT HANDLE (MISCELLANEOUS) ×3 IMPLANT
DECANTER SPIKE VIAL GLASS SM (MISCELLANEOUS) ×3 IMPLANT
ELECT REM PT RETURN 15FT ADLT (MISCELLANEOUS) ×3 IMPLANT
GLOVE BIO SURGEON STRL SZ7.5 (GLOVE) ×3 IMPLANT
GLOVE BIOGEL PI IND STRL 7.0 (GLOVE) IMPLANT
GLOVE BIOGEL PI INDICATOR 7.0 (GLOVE) ×8
GLOVE INDICATOR 8.0 STRL GRN (GLOVE) ×3 IMPLANT
GLOVE SURG SS PI 7.0 STRL IVOR (GLOVE) ×2 IMPLANT
GOWN STRL REUS W/TWL XL LVL3 (GOWN DISPOSABLE) ×9 IMPLANT
GRASPER SUT TROCAR 14GX15 (MISCELLANEOUS) IMPLANT
HEMOSTAT SNOW SURGICEL 2X4 (HEMOSTASIS) IMPLANT
KIT BASIN OR (CUSTOM PROCEDURE TRAY) ×3 IMPLANT
KIT TURNOVER KIT A (KITS) IMPLANT
POUCH RETRIEVAL ECOSAC 10 (ENDOMECHANICALS) ×1 IMPLANT
POUCH RETRIEVAL ECOSAC 10MM (ENDOMECHANICALS) ×2
SCISSORS LAP 5X35 DISP (ENDOMECHANICALS) ×3 IMPLANT
SET IRRIG TUBING LAPAROSCOPIC (IRRIGATION / IRRIGATOR) ×3 IMPLANT
SET TUBE SMOKE EVAC HIGH FLOW (TUBING) ×3 IMPLANT
SLEEVE XCEL OPT CAN 5 100 (ENDOMECHANICALS) ×6 IMPLANT
STRIP CLOSURE SKIN 1/2X4 (GAUZE/BANDAGES/DRESSINGS) ×1 IMPLANT
SUT MNCRL AB 4-0 PS2 18 (SUTURE) ×3 IMPLANT
SUT VIC AB 0 UR5 27 (SUTURE) IMPLANT
SUT VICRYL 0 TIES 12 18 (SUTURE) IMPLANT
SUT VICRYL 0 UR6 27IN ABS (SUTURE) IMPLANT
TOWEL OR 17X26 10 PK STRL BLUE (TOWEL DISPOSABLE) ×3 IMPLANT
TOWEL OR NON WOVEN STRL DISP B (DISPOSABLE) ×3 IMPLANT
TRAY LAPAROSCOPIC (CUSTOM PROCEDURE TRAY) ×3 IMPLANT
TROCAR BLADELESS OPT 5 100 (ENDOMECHANICALS) ×3 IMPLANT
TROCAR XCEL BLUNT TIP 100MML (ENDOMECHANICALS) IMPLANT
TROCAR XCEL NON-BLD 11X100MML (ENDOMECHANICALS) IMPLANT

## 2018-08-16 NOTE — Anesthesia Preprocedure Evaluation (Signed)
Anesthesia Evaluation  Patient identified by MRN, date of birth, ID band Patient awake    Reviewed: Allergy & Precautions, NPO status , Patient's Chart, lab work & pertinent test results  History of Anesthesia Complications Negative for: history of anesthetic complications  Airway Mallampati: III  TM Distance: >3 FB Neck ROM: Full    Dental  (+) Dental Advisory Given   Pulmonary sleep apnea , neg recent URI, former smoker,    breath sounds clear to auscultation       Cardiovascular hypertension, Pt. on medications (-) angina+ DOE  (-) Past MI and (-) CHF  Rhythm:Regular     Neuro/Psych  Headaches, PSYCHIATRIC DISORDERS    GI/Hepatic GERD  Medicated,biliary colic   Endo/Other  diabetesHypothyroidism Morbid obesity  Renal/GU      Musculoskeletal negative musculoskeletal ROS (+)   Abdominal   Peds  Hematology negative hematology ROS (+)   Anesthesia Other Findings   Reproductive/Obstetrics                             Anesthesia Physical Anesthesia Plan  ASA: III  Anesthesia Plan: General   Post-op Pain Management:    Induction: Intravenous  PONV Risk Score and Plan: 3 and Ondansetron and Dexamethasone  Airway Management Planned: Oral ETT  Additional Equipment:   Intra-op Plan:   Post-operative Plan: Extubation in OR  Informed Consent: I have reviewed the patients History and Physical, chart, labs and discussed the procedure including the risks, benefits and alternatives for the proposed anesthesia with the patient or authorized representative who has indicated his/her understanding and acceptance.     Dental advisory given  Plan Discussed with: CRNA and Surgeon  Anesthesia Plan Comments:         Anesthesia Quick Evaluation

## 2018-08-16 NOTE — Transfer of Care (Signed)
Immediate Anesthesia Transfer of Care Note  Patient: Anna Walker  Procedure(s) Performed: LAPAROSCOPIC CHOLECYSTECTOMY (N/A Abdomen)  Patient Location: PACU  Anesthesia Type:General  Level of Consciousness: drowsy, patient cooperative and responds to stimulation  Airway & Oxygen Therapy: Patient Spontanous Breathing and Patient connected to face mask oxygen  Post-op Assessment: Report given to RN and Post -op Vital signs reviewed and stable  Post vital signs: Reviewed and stable  Last Vitals:  Vitals Value Taken Time  BP    Temp    Pulse 89 08/16/18 0842  Resp 18 08/16/18 0842  SpO2 100 % 08/16/18 0842  Vitals shown include unvalidated device data.  Last Pain:  Vitals:   08/16/18 0613  TempSrc: Oral  PainSc:       Patients Stated Pain Goal: 4 (72/76/18 4859)  Complications: No apparent anesthesia complications

## 2018-08-16 NOTE — Anesthesia Procedure Notes (Signed)
Procedure Name: Intubation Date/Time: 08/16/2018 7:38 AM Performed by: Glory Buff, CRNA Pre-anesthesia Checklist: Patient identified, Emergency Drugs available, Suction available and Patient being monitored Patient Re-evaluated:Patient Re-evaluated prior to induction Oxygen Delivery Method: Circle system utilized Preoxygenation: Pre-oxygenation with 100% oxygen Induction Type: IV induction Ventilation: Mask ventilation without difficulty Laryngoscope Size: Miller and 3 Grade View: Grade II Tube type: Oral Tube size: 7.0 mm Number of attempts: 1 Airway Equipment and Method: Stylet and Oral airway Placement Confirmation: ETT inserted through vocal cords under direct vision,  positive ETCO2 and breath sounds checked- equal and bilateral Secured at: 20 cm Tube secured with: Tape Dental Injury: Teeth and Oropharynx as per pre-operative assessment

## 2018-08-16 NOTE — H&P (Signed)
Anna Walker is an 63 y.o. female.   Chief Complaint: here for surgery  HPI: 63 yo female presents for surgery for RUQ pain/biliary colic. Denies changes since seen in clinic  The patient is a 63 year old female who presents for evaluation of gallbladder disease. She is referred by Dr Penelope Coop for evaluation of right upper quadrant pain. She states that she has had stomach issues for many months ever since November and December. She states that her stomach will get upset after eating. She points to her epigastric and right upper quadrant and to her side. She will also have it radiates to her upper back as well. She may have some nausea occasionally with it as well as bloating. The increased discomfort will last for about an hour until it eases up to sort of her chronic discomfort that she has been having in her right upper abdomen. She denies any fever or chills or vomiting. She has had 2 C-sections and hysterectomy. She uses CPAP. She does not carry a diagnosis of multiple sclerosis. She just has optic neuritis. She denies any acholic stools. She denies any weight loss. She had an ultrasound which was negative for gallstones. She had a nuclear medicine scan which revealed an abnormal ejection fraction of her gallbladder. She cares for her husband who is homebound  Past Medical History:  Diagnosis Date  . ADD (attention deficit disorder)   . Biliary colic   . Bladder leak   . Diabetes mellitus without complication (HCC)    type 2 , diet controlled   . DVT (deep venous thrombosis) (HCC)    left leg , resolved after treatment of blood thinners   . GERD (gastroesophageal reflux disease)    causes coughing   . Hypertension   . Leg cramps    "in my thighs"   . Migraines   . Multiple sclerosis ([place])    per patient " that ha sbeen ruled out , they though it was that initially but it did not preogress like it"  . Optic neuritis    lesion on right optic nerve; right eye only ,  reduced vision and blurriness , loss of depth perception, glasses do not help when it flares    . OSA (obstructive sleep apnea)    nightly cpap   . Paresthesias/numbness    fingers and toes   . Pneumonia 2017  . Restless leg syndrome   . Thyroid disease    Hypothyroidism    Past Surgical History:  Procedure Laterality Date  . ABDOMINAL HYSTERECTOMY    . BREAST LUMPECTOMY Left   . CESAREAN SECTION     x2   . MENISCUS REPAIR Left left  . MENISCUS REPAIR Right    knee   . TUBAL LIGATION     during 2nd c section     Family History  Problem Relation Age of Onset  . Barrett's esophagus Mother   . Hypertension Mother   . Graves' disease Sister   . Rheum arthritis Sister   . Heart attack Father 72  . Coronary artery disease Neg Hx    Social History:  reports that she quit smoking about 34 years ago. She has never used smokeless tobacco. She reports current alcohol use. She reports that she does not use drugs.  Allergies:  Allergies  Allergen Reactions  . Adderall [Amphetamine-Dextroamphetamine] Other (See Comments)    Caused elevated BP  . Topamax [Topiramate]     dizzy    Medications Prior  to Admission  Medication Sig Dispense Refill  . acetaminophen (TYLENOL) 500 MG tablet Take 1,500 mg by mouth 3 (three) times daily as needed (PAIN.).    Marland Kitchen amantadine (SYMMETREL) 100 MG capsule TAKE 1 CAPSULE(100 MG) BY MOUTH THREE TIMES DAILY (Patient taking differently: Take 300 mg by mouth daily. ) 270 capsule 3  . aspirin (ASPIRIN EC) 81 MG EC tablet Take 81 mg by mouth daily. Swallow whole.    Marland Kitchen FLUoxetine (PROZAC) 10 MG capsule Take 10 mg by mouth at bedtime. Take with 20 mg dose    . FLUoxetine (PROZAC) 20 MG capsule Take 20 mg by mouth at bedtime. Take with 10 mg dose    . FLUoxetine (PROZAC) 20 MG tablet Take 20 mg by mouth at bedtime.     . gabapentin (NEURONTIN) 300 MG capsule Take 1-2 capsules (300-600 mg total) by mouth 3 (three) times daily. 300 mg at dinner and 600 mg at  bedtime (Patient taking differently: Take 300-600 mg by mouth See admin instructions. Take 1 capsule (300 mg) by mouth at dinner and 2 capsules  (600 mg) at bedtime) 270 capsule 4  . hydrochlorothiazide (HYDRODIURIL) 12.5 MG tablet Take 12.5 mg by mouth daily.     Marland Kitchen levothyroxine (SYNTHROID, LEVOTHROID) 150 MCG tablet Take 150 mcg by mouth daily before breakfast.    . omeprazole (PRILOSEC) 40 MG capsule Take 1 capsule (40 mg total) by mouth 2 (two) times daily before a meal. 60 capsule 1  . ondansetron (ZOFRAN) 4 MG tablet Take 4 mg by mouth every 8 (eight) hours as needed for nausea/vomiting.    Marland Kitchen oxybutynin (DITROPAN) 5 MG tablet Take 5 mg by mouth every evening.     Marland Kitchen rOPINIRole (REQUIP) 3 MG tablet TAKE 1 TABLET BY MOUTH DAILY AT DINNER AND 1 TABLET AT BEDTIME.  PLEASE CALL [phone] TO SCHEDULE AN APPT. (Patient taking differently: Take 3 mg by mouth at bedtime. ) 180 tablet 0  . simvastatin (ZOCOR) 10 MG tablet Take 10 mg by mouth every evening.     . SUMAtriptan (IMITREX) 100 MG tablet Take 1 tablet (100 mg total) by mouth once as needed for migraine. May repeat in 2 hours if headache persists or recurs. 10 tablet 5    Results for orders placed or performed during the hospital encounter of 08/16/18 (from the past 48 hour(s))  Glucose, capillary     Status: None   Collection Time: 08/16/18  5:46 AM  Result Value Ref Range   Glucose-Capillary 98 70 - 99 mg/dL   Comment 1 Notify RN    Comment 2 Document in Chart    No results found.  Review of Systems  All other systems reviewed and are negative.   Blood pressure (!) 144/91, pulse 70, temperature 98.4 F (36.9 C), temperature source Oral, resp. rate 18, height 5\' 6"  (1.676 m), weight 127.5 kg, SpO2 98 %. Physical Exam  Vitals reviewed. Constitutional: She is oriented to person, place, and time. She appears well-developed and well-nourished. No distress.  Severe obesity  HENT:  Head: Normocephalic and atraumatic.  Right Ear:  External ear normal.  Left Ear: External ear normal.  Eyes: Conjunctivae are normal. No scleral icterus.  Neck: Normal range of motion. Neck supple. No tracheal deviation present. No thyromegaly present.  Cardiovascular: Normal rate and normal heart sounds.  Respiratory: Effort normal and breath sounds normal. No stridor. No respiratory distress. She has no wheezes.  GI: Soft. She exhibits no distension. There is no abdominal  tenderness.  Musculoskeletal:        General: No tenderness or edema.  Lymphadenopathy:    She has no cervical adenopathy.  Neurological: She is alert and oriented to person, place, and time. She exhibits normal muscle tone.  Skin: Skin is warm and dry. No rash noted. She is not diaphoretic. No erythema. No pallor.  Psychiatric: She has a normal mood and affect. Her behavior is normal. Judgment and thought content normal.     Assessment/Plan Biliary colic Severe obesity H/o dvt osa on cpap  To OR for lap chole Eras  All questions asked & answered  Leighton Ruff. Redmond Pulling, MD, FACS General, Bariatric, & Minimally Invasive Surgery [facility], [place]   Greer Pickerel, MD 08/16/2018, 7:19 AM

## 2018-08-16 NOTE — Op Note (Signed)
Anna Walker [id number] 12/29/1955 08/16/2018  Laparoscopic Cholecystectomy Procedure Note  Indications: This patient presents with symptomatic gallbladder disease and will undergo laparoscopic cholecystectomy. Please see h&p for additional details  Pre-operative Diagnosis: biliary dyskinesia  Post-operative Diagnosis: Same  Surgeon: Greer Pickerel MD FACS  Assistants: none  Anesthesia: General endotracheal anesthesia  Procedure Details  The patient was seen again in the Holding Room. The risks, benefits, complications, treatment options, and expected outcomes were discussed with the patient. The possibilities of reaction to medication, pulmonary aspiration, perforation of viscus, bleeding, recurrent infection, finding a normal gallbladder, the need for additional procedures, failure to diagnose a condition, the possible need to convert to an open procedure, and creating a complication requiring transfusion or operation were discussed with the patient. The likelihood of improving the patient's symptoms with return to their baseline status is good.  The patient and/or family concurred with the proposed plan, giving informed consent. The site of surgery properly noted. The patient was taken to Operating Room, identified as Anna Walker and the procedure verified as Laparoscopic Cholecystectomy. A Time Out was held and the above information confirmed. Antibiotic prophylaxis was administered. She received oral tylenol and gabapentin and subcu heparin preoperatively   Prior to the induction of general anesthesia, antibiotic prophylaxis was administered. General endotracheal anesthesia was then administered and tolerated well. After the induction, the abdomen was prepped with Chloraprep and draped in the sterile fashion. The patient was positioned in the supine position.  Local anesthetic agent was injected into the skin near the umbilicus and an incision made. We dissected down to the  abdominal fascia with blunt dissection.  The fascia was incised vertically and we entered the peritoneal cavity bluntly.  A pursestring suture of 0-Vicryl was placed around the fascial opening.  The Hasson cannula was inserted and secured with the stay suture.  Pneumoperitoneum was then created with CO2 and tolerated well without any adverse changes in the patient's vital signs. An 5-mm port was placed in the subxiphoid position.  Two 5-mm ports were placed in the right upper quadrant. All skin incisions were infiltrated with a local anesthetic agent before making the incision and placing the trocars.   We positioned the patient in reverse Trendelenburg, tilted slightly to the patient's left.  The gallbladder was identified, the fundus grasped and retracted cephalad. Adhesions were lysed bluntly and with the electrocautery where indicated, taking care not to injure any adjacent organs or viscus. The infundibulum was grasped and retracted laterally, exposing the peritoneum overlying the triangle of Calot. This was then divided and exposed in a blunt fashion. A critical view of the cystic duct and cystic artery was obtained.  The cystic duct was clearly identified and bluntly dissected circumferentially.   The cystic duct was then ligated with clips and divided. The cystic artery which had been identified & dissected free was ligated with clips and divided as well.   The gallbladder was dissected from the liver bed in retrograde fashion with the electrocautery. There was a little spillage of bile from the gallbladder. The gallbladder was removed and placed in an Ecco sac.  The gallbladder and Ecco sac were then removed through the umbilical port site. The liver bed was irrigated and inspected. Hemostasis was achieved with the electrocautery. Copious irrigation was utilized and was repeatedly aspirated until clear.  The pursestring suture was used to close the umbilical fascia.    We again inspected the right  upper quadrant for hemostasis.  The umbilical closure was  inspected and there was no air leak and nothing trapped within the closure. Pneumoperitoneum was released as we removed the trocars.  4-0 Monocryl was used to close the skin.   Benzoin, steri-strips, and clean dressings were applied. The patient was then extubated and brought to the recovery room in stable condition. Instrument, sponge, and needle counts were correct at closure and at the conclusion of the case.   Findings: Probable chronic cholecystitis +critical view  Estimated Blood Loss: Minimal         Drains: none         Specimens: Gallbladder           Complications: None; patient tolerated the procedure well.         Disposition: PACU - hemodynamically stable.         Condition: stable  Leighton Ruff. Redmond Pulling, MD, FACS General, Bariatric, & Minimally Invasive Surgery [facility], [place]

## 2018-08-16 NOTE — Progress Notes (Signed)
Daughter Lexine Baton staying with pt for 24 hours after surgery.

## 2018-08-16 NOTE — Discharge Instructions (Signed)
[facility] °LAPAROSCOPIC SURGERY: POST OP INSTRUCTIONS °Always review your discharge instruction sheet given to you by the facility where your surgery was performed. °IF YOU HAVE DISABILITY OR FAMILY LEAVE FORMS, YOU MUST BRING THEM TO THE OFFICE FOR PROCESSING.   °DO NOT GIVE THEM TO YOUR DOCTOR. ° °PAIN CONTROL ° °1. First take acetaminophen (Tylenol) AND/or ibuprofen (Advil) to control your pain after surgery.  Follow directions on package.  Taking acetaminophen (Tylenol) and/or ibuprofen (Advil) regularly after surgery will help to control your pain and lower the amount of prescription pain medication you may need.  You should not take more than 3,000 mg (3 grams) of acetaminophen (Tylenol) in 24 hours.  You should not take ibuprofen (Advil), aleve, motrin, naprosyn or other NSAIDS if you have a history of stomach ulcers or chronic kidney disease.  °2. A prescription for pain medication may be given to you upon discharge.  Take your pain medication as prescribed, if you still have uncontrolled pain after taking acetaminophen (Tylenol) or ibuprofen (Advil). °3. Use ice packs to help control pain. °4. If you need a refill on your pain medication, please contact your pharmacy.  They will contact our office to request authorization. Prescriptions will not be filled after 5pm or on week-ends. ° °HOME MEDICATIONS °5. Take your usually prescribed medications unless otherwise directed. ° °DIET °6. You should follow a light diet the first few days after arrival home.  Be sure to include lots of fluids daily. Avoid fatty, fried foods.  ° °CONSTIPATION °7. It is common to experience some constipation after surgery and if you are taking pain medication.  Increasing fluid intake and taking a stool softener (such as Colace) will usually help or prevent this problem from occurring.  A mild laxative (Milk of Magnesia or Miralax) should be taken according to package instructions if there are no bowel  movements after 48 hours. ° °WOUND/INCISION CARE °8. Most patients will experience some swelling and bruising in the area of the incisions.  Ice packs will help.  Swelling and bruising can take several days to resolve.  °9. Unless discharge instructions indicate otherwise, follow guidelines below  °a. STERI-STRIPS - you may remove your outer bandages 48 hours after surgery, and you may shower at that time.  You have steri-strips (small skin tapes) in place directly over the incision.  These strips should be left on the skin for 7-10 days.   °b. DERMABOND/SKIN GLUE - you may shower in 24 hours.  The glue will flake off over the next 2-3 weeks. °10. Any sutures or staples will be removed at the office during your follow-up visit. ° °ACTIVITIES °11. You may resume regular (light) daily activities beginning the next day--such as daily self-care, walking, climbing stairs--gradually increasing activities as tolerated.  You may have sexual intercourse when it is comfortable.  Refrain from any heavy lifting or straining until approved by your doctor. °a. You may drive when you are no longer taking prescription pain medication, you can comfortably wear a seatbelt, and you can safely maneuver your car and apply brakes. ° °FOLLOW-UP °12. You should see your doctor in the office for a follow-up appointment approximately 2-3 weeks after your surgery.  You should have been given your post-op/follow-up appointment when your surgery was scheduled.  If you did not receive a post-op/follow-up appointment, make sure that you call for this appointment within a day or two after you arrive home to insure a convenient appointment time. ° °OTHER   INSTRUCTIONS 13. Get up and walk around hourly during the daytime  WHEN TO CALL YOUR DOCTOR: 1. Fever over 101.0 2. Inability to urinate 3. Continued bleeding from incision. 4. Increased pain, redness, or drainage from the incision. 5. Increasing abdominal pain  The clinic staff is  available to answer your questions during regular business hours.  Please dont hesitate to call and ask to speak to one of the nurses for clinical concerns.  If you have a medical emergency, go to the nearest emergency room or call 911.  A surgeon from [facility] is always on call at the hospital. [address] [place], [place], [place]  [address] ? [address], [place], [place]   [address] [phone] ? [phone] ? FAX [phone] Web site: [email or web address]     Managing Your Pain After Surgery Without Opioids    Thank you for participating in our program to help patients manage their pain after surgery without opioids. This is part of our effort to provide you with the best care possible, without exposing you or your family to the risk that opioids pose.  What pain can I expect after surgery? You can expect to have some pain after surgery. This is normal. The pain is typically worse the day after surgery, and quickly begins to get better. Many studies have found that many patients are able to manage their pain after surgery with Over-the-Counter (OTC) medications such as Tylenol and Motrin. If you have a condition that does not allow you to take Tylenol or Motrin, notify your surgical team.  How will I manage my pain? The best strategy for controlling your pain after surgery is around the clock pain control with Tylenol (acetaminophen) and Motrin (ibuprofen or Advil). Alternating these medications with each other allows you to maximize your pain control. In addition to Tylenol and Motrin, you can use heating pads or ice packs on your incisions to help reduce your pain.  How will I alternate your regular strength over-the-counter pain medication? You will take a dose of pain medication every three hours. ; Start by taking 650 mg of Tylenol (2 pills of 325 mg) ; 3 hours later take 600 mg of Motrin (3 pills of 200 mg) ; 3 hours after taking  the Motrin take 650 mg of Tylenol ; 3 hours after that take 600 mg of Motrin.   - 1 -  See example - if your first dose of Tylenol is at 12:00 PM   12:00 PM Tylenol 650 mg (2 pills of 325 mg)  3:00 PM Motrin 600 mg (3 pills of 200 mg)  6:00 PM Tylenol 650 mg (2 pills of 325 mg)  9:00 PM Motrin 600 mg (3 pills of 200 mg)  Continue alternating every 3 hours   We recommend that you follow this schedule around-the-clock for at least 3 days after surgery, or until you feel that it is no longer needed. Use the table on the last page of this handout to keep track of the medications you are taking. Important: Do not take more than 3000mg  of Tylenol or 2300mg  of Motrin in a 24-hour period. Do not take ibuprofen/Motrin if you have a history of bleeding stomach ulcers, severe kidney disease, &/or actively taking a blood thinner  What if I still have pain? If you have pain that is not controlled with the over-the-counter pain medications (Tylenol and Motrin or Advil) you might have what we call breakthrough pain. You will receive a  prescription for a small amount of an opioid pain medication such as Oxycodone, Tramadol, or Tylenol with Codeine. Use these opioid pills in the first 24 hours after surgery if you have breakthrough pain. Do not take more than 1 pill every 4-6 hours.  If you still have uncontrolled pain after using all opioid pills, don't hesitate to call our staff using the number provided. We will help make sure you are managing your pain in the best way possible, and if necessary, we can provide a prescription for additional pain medication.   Day 1    Time  Name of Medication Number of pills taken  Amount of Acetaminophen  Pain Level   Comments  AM PM       AM PM       AM PM       AM PM       AM PM       AM PM       AM PM       AM PM       Total Daily amount of Acetaminophen Do not take more than  3,000 mg per day      Day 2    Time  Name of Medication Number of  pills taken  Amount of Acetaminophen  Pain Level   Comments  AM PM       AM PM       AM PM       AM PM       AM PM       AM PM       AM PM       AM PM       Total Daily amount of Acetaminophen Do not take more than  3,000 mg per day      Day 3    Time  Name of Medication Number of pills taken  Amount of Acetaminophen  Pain Level   Comments  AM PM       AM PM       AM PM       AM PM          AM PM       AM PM       AM PM       AM PM       Total Daily amount of Acetaminophen Do not take more than  3,000 mg per day      Day 4    Time  Name of Medication Number of pills taken  Amount of Acetaminophen  Pain Level   Comments  AM PM       AM PM       AM PM       AM PM       AM PM       AM PM       AM PM       AM PM       Total Daily amount of Acetaminophen Do not take more than  3,000 mg per day      Day 5    Time  Name of Medication Number of pills taken  Amount of Acetaminophen  Pain Level   Comments  AM PM       AM PM       AM PM       AM PM       AM PM  AM PM       AM PM       AM PM       Total Daily amount of Acetaminophen Do not take more than  3,000 mg per day       Day 6    Time  Name of Medication Number of pills taken  Amount of Acetaminophen  Pain Level  Comments  AM PM       AM PM       AM PM       AM PM       AM PM       AM PM       AM PM       AM PM       Total Daily amount of Acetaminophen Do not take more than  3,000 mg per day      Day 7    Time  Name of Medication Number of pills taken  Amount of Acetaminophen  Pain Level   Comments  AM PM       AM PM       AM PM       AM PM       AM PM       AM PM       AM PM       AM PM       Total Daily amount of Acetaminophen Do not take more than  3,000 mg per day        For additional information about how and where to safely dispose of unused opioid medications - [employer]  Disclaimer: This document contains  information and/or instructional materials adapted from [facility] for the typical patient with your condition. It does not replace medical advice from your health care provider because your experience may differ from that of the typical patient. Talk to your health care provider if you have any questions about this document, your condition or your treatment plan. Adapted from [employer]

## 2018-08-17 ENCOUNTER — Encounter (HOSPITAL_COMMUNITY): Payer: Self-pay | Admitting: General Surgery

## 2018-08-20 NOTE — Anesthesia Postprocedure Evaluation (Signed)
Anesthesia Post Note  Patient: Anna Walker  Procedure(s) Performed: LAPAROSCOPIC CHOLECYSTECTOMY (N/A Abdomen)     Patient location during evaluation: PACU Anesthesia Type: General Level of consciousness: awake and alert Pain management: pain level controlled Vital Signs Assessment: post-procedure vital signs reviewed and stable Respiratory status: spontaneous breathing, nonlabored ventilation, respiratory function stable and patient connected to nasal cannula oxygen Cardiovascular status: blood pressure returned to baseline and stable Postop Assessment: no apparent nausea or vomiting Anesthetic complications: no    Last Vitals:  Vitals:   08/16/18 1000 08/16/18 1021  BP: 130/70 (!) 145/77  Pulse: 95 91  Resp: 15   Temp:    SpO2: 91% 98%    Last Pain:  Vitals:   08/16/18 1000  TempSrc:   PainSc: 3                  Hector Taft

## 2018-08-27 ENCOUNTER — Other Ambulatory Visit: Payer: Self-pay

## 2018-08-27 ENCOUNTER — Encounter (HOSPITAL_COMMUNITY): Payer: Self-pay | Admitting: Emergency Medicine

## 2018-08-27 ENCOUNTER — Emergency Department (HOSPITAL_COMMUNITY): Payer: PPO

## 2018-08-27 ENCOUNTER — Telehealth: Payer: Self-pay | Admitting: Neurology

## 2018-08-27 ENCOUNTER — Ambulatory Visit (HOSPITAL_COMMUNITY)
Admission: EM | Admit: 2018-08-27 | Discharge: 2018-08-27 | Disposition: A | Discharge status: Other Acute Inpatient Hospital | Payer: PPO | Source: Home / Self Care

## 2018-08-27 ENCOUNTER — Emergency Department (HOSPITAL_COMMUNITY)
Admission: EM | Admit: 2018-08-27 | Discharge: 2018-08-27 | Disposition: A | Discharge status: Home / Self Care | Payer: PPO | Attending: Emergency Medicine | Admitting: Emergency Medicine

## 2018-08-27 DIAGNOSIS — Z7982 Long term (current) use of aspirin: Secondary | ICD-10-CM | POA: Insufficient documentation

## 2018-08-27 DIAGNOSIS — I10 Essential (primary) hypertension: Secondary | ICD-10-CM | POA: Insufficient documentation

## 2018-08-27 DIAGNOSIS — H538 Other visual disturbances: Secondary | ICD-10-CM | POA: Diagnosis not present

## 2018-08-27 DIAGNOSIS — E039 Hypothyroidism, unspecified: Secondary | ICD-10-CM | POA: Insufficient documentation

## 2018-08-27 DIAGNOSIS — M4802 Spinal stenosis, cervical region: Secondary | ICD-10-CM | POA: Diagnosis not present

## 2018-08-27 DIAGNOSIS — Z79899 Other long term (current) drug therapy: Secondary | ICD-10-CM | POA: Diagnosis not present

## 2018-08-27 DIAGNOSIS — H469 Unspecified optic neuritis: Secondary | ICD-10-CM | POA: Insufficient documentation

## 2018-08-27 DIAGNOSIS — H4720 Unspecified optic atrophy: Secondary | ICD-10-CM | POA: Diagnosis not present

## 2018-08-27 DIAGNOSIS — M4312 Spondylolisthesis, cervical region: Secondary | ICD-10-CM | POA: Diagnosis not present

## 2018-08-27 DIAGNOSIS — G43109 Migraine with aura, not intractable, without status migrainosus: Secondary | ICD-10-CM

## 2018-08-27 DIAGNOSIS — Z87891 Personal history of nicotine dependence: Secondary | ICD-10-CM | POA: Diagnosis not present

## 2018-08-27 DIAGNOSIS — E119 Type 2 diabetes mellitus without complications: Secondary | ICD-10-CM | POA: Insufficient documentation

## 2018-08-27 DIAGNOSIS — M47812 Spondylosis without myelopathy or radiculopathy, cervical region: Secondary | ICD-10-CM | POA: Diagnosis not present

## 2018-08-27 LAB — COMPREHENSIVE METABOLIC PANEL
ALT: 21 U/L (ref 0–44)
AST: 20 U/L (ref 15–41)
Albumin: 3.6 g/dL (ref 3.5–5.0)
Alkaline Phosphatase: 74 U/L (ref 38–126)
Anion gap: 9 (ref 5–15)
BUN: 11 mg/dL (ref 6–20)
CO2: 27 mmol/L (ref 22–32)
Calcium: 8.7 mg/dL — ABNORMAL LOW (ref 8.9–10.3)
Chloride: 104 mmol/L (ref 98–111)
Creatinine, Ser: 1.04 mg/dL — ABNORMAL HIGH (ref 0.44–1.00)
GFR calc Af Amer: >60 mL/min (ref >60)
GFR calc non Af Amer: 58 mL/min — ABNORMAL LOW (ref >60)
Glucose, Bld: 113 mg/dL — ABNORMAL HIGH (ref 70–99)
Potassium: 3.8 mmol/L (ref 3.5–5.1)
Sodium: 140 mmol/L (ref 135–145)
Total Bilirubin: 0.7 mg/dL (ref 0.3–1.2)
Total Protein: 6.7 g/dL (ref 6.5–8.1)

## 2018-08-27 LAB — CBC WITH DIFFERENTIAL/PLATELET
Abs Immature Granulocytes: 0.02 10*3/uL (ref 0.00–0.07)
Basophils Absolute: 0 10*3/uL (ref 0.0–0.1)
Basophils Relative: 0 %
Eosinophils Absolute: 0.1 10*3/uL (ref 0.0–0.5)
Eosinophils Relative: 2 %
HCT: 37.3 % (ref 36.0–46.0)
Hemoglobin: 11.7 g/dL — ABNORMAL LOW (ref 12.0–15.0)
Immature Granulocytes: 0 %
Lymphocytes Relative: 29 %
Lymphs Abs: 1.8 10*3/uL (ref 0.7–4.0)
MCH: 26.9 pg (ref 26.0–34.0)
MCHC: 31.4 g/dL (ref 30.0–36.0)
MCV: 85.7 fL (ref 80.0–100.0)
Monocytes Absolute: 0.6 10*3/uL (ref 0.1–1.0)
Monocytes Relative: 9 %
Neutro Abs: 3.8 10*3/uL (ref 1.7–7.7)
Neutrophils Relative %: 60 %
Platelets: 245 10*3/uL (ref 150–400)
RBC: 4.35 MIL/uL (ref 3.87–5.11)
RDW: 13.8 % (ref 11.5–15.5)
WBC: 6.3 10*3/uL (ref 4.0–10.5)
nRBC: 0 % (ref 0.0–0.2)

## 2018-08-27 LAB — I-STAT CREATININE, ED: Creatinine, Ser: 1 mg/dL (ref 0.44–1.00)

## 2018-08-27 LAB — CBG MONITORING, ED: Glucose-Capillary: 70 mg/dL (ref 70–99)

## 2018-08-27 MED ORDER — MAGNESIUM SULFATE IN D5W 1-5 GM/100ML-% IV SOLN
1.0000 g | Freq: Once | INTRAVENOUS | Status: DC
  Filled 2018-08-27: qty 100

## 2018-08-27 MED ORDER — KETOROLAC TROMETHAMINE 15 MG/ML IJ SOLN
15.0000 mg | Freq: Once | INTRAMUSCULAR | Status: AC
  Administered 2018-08-27: 15 mg via INTRAVENOUS
  Filled 2018-08-27: qty 1

## 2018-08-27 MED ORDER — GADOBUTROL 1 MMOL/ML IV SOLN
10.0000 mL | Freq: Once | INTRAVENOUS | Status: AC | PRN
  Administered 2018-08-27: 10 mL via INTRAVENOUS

## 2018-08-27 MED ORDER — METOCLOPRAMIDE HCL 5 MG/ML IJ SOLN
10.0000 mg | Freq: Once | INTRAMUSCULAR | Status: AC
  Administered 2018-08-27: 10 mg via INTRAVENOUS
  Filled 2018-08-27: qty 2

## 2018-08-27 MED ORDER — DEXAMETHASONE SODIUM PHOSPHATE 10 MG/ML IJ SOLN
10.0000 mg | Freq: Once | INTRAMUSCULAR | Status: AC
  Administered 2018-08-27: 10 mg via INTRAVENOUS
  Filled 2018-08-27: qty 1

## 2018-08-27 MED ORDER — VALPROATE SODIUM 500 MG/5ML IV SOLN
500.0000 mg | Freq: Once | INTRAVENOUS | Status: AC
  Administered 2018-08-27: 500 mg via INTRAVENOUS
  Filled 2018-08-27: qty 5

## 2018-08-27 NOTE — Telephone Encounter (Signed)
Left message with after hour service on 08-27-18 @ 12:59 pm  Caller states that she had gall bladder surgery on 08-16-18 and optic neuritis has flared more blurry in rt and left eye is also blurry (no past problem with left eye )   please call

## 2018-08-27 NOTE — ED Provider Notes (Addendum)
  Physical Exam  BP 125/65 (BP Location: Right Arm)   Pulse 67   Temp 99.1 F (37.3 C)   Resp 16   SpO2 98%   Physical Exam  ED Course/Procedures     Procedures  MDM   Assuming care of patient from Dr. Tyrone Nine.   Patient in the ED for bilateral blurry vision. Workup thus far shows no acute changes.  Concerning findings are as following - new blurry vision in both eyes. Important pending results are MRIs.  According to Dr. Tyrone Nine, plan is to f/u on mri and neuro recs.   Patient had no complains, no concerns from the nursing side. Will continue to monitor.    Varney Biles, MD 08/27/18 1514  9:52 PM Patient's MRI results did not show any evidence of optic neuritis or new MS flare. I discussed the case with neurologist.  They came and saw the patient, and agree that likely patient is having complex migraines.  I have ordered the migraine cocktail as per the request.  She did not have significant change after initial round of Toradol and Reglan.  After this second round of medications, she will be discharged.  I have discussed the ER results and recommendations to the patient and advised that she follow-up with neurology as soon as possible.  Patient agrees.   Varney Biles, MD 08/27/18 2153

## 2018-08-27 NOTE — Discharge Instructions (Addendum)
Please see the neurologist as soon as possible for further evaluation. The MRI of your brain and eyes do not show evidence of optic neuritis or worsening MS.  Thought right now is that you are having complex headaches.  Please return to the ER if the headache gets severe and in not improving, you have associated new one sided numbness, tingling, weakness or confusion, seizures, poor balance or poor vision.

## 2018-08-27 NOTE — ED Triage Notes (Signed)
Blurred vision since Sat , has hx of optic neuritis in rt eye , first time for blurry vision in left , called neuro office today and sent here

## 2018-08-27 NOTE — ED Notes (Signed)
Pt in MRI.

## 2018-08-27 NOTE — Consult Note (Signed)
Requesting Physician: Dr. Bary Leriche    Chief Complaint: Blurry vision in both eyes  History obtained from: Patient and Chart     HPI:                                                                                                                                       Anna Walker is a 63 y.o. female with past medical history of right optic neuritis ( prev diagnosed also with MS) ,migraines, type 2 diabetes mellitus, , hypertension, hypothyroidism, obstructive sleep apnea with recent cholecystectomy presents to the emergency department with blurry vision in both of her eyes since Saturday.  Patient at baseline has tunnel vision in her right eye following right eye optic neuritis diagnosed in 1998.  While she was picking up groceries from [place] on Saturday afternoon, she noticed she had trouble seeing and it was difficult for her to drive back home.  She describes it as multiple layers of Saran wrap covering both her eyes.  Denies any visual floaters, altered colors or scotomas.  Her symptoms have stayed the same since then and she would have trouble reading, difficulty seeing objects and decided to come to the emergency department.  She denies having a headache when the symptoms started however is now complaining of a headache with mild photophobia. She denies any nausea or vomiting.  She also denies double vision, sensory or motor symptoms.  ED course Patient underwent MRI of her brain, MRI orbits as well as MRI C-spine with and without contrast to look for any enhancing lesion/inflammation of the optic nerves.  MRIs were negative for any new lesions, no optic nerve enhancement was seen in either eye.  Right eye optic nerve redemonstrated atrophy.  She was given a migraine cocktail of Toradol and Reglan which did not improve her symptoms.  CBC and CMP were within normal limits.  Prior history of optic neuritis/? MS  Was diagnosed 4.  Initially was treated as multiple sclerosis as  there was questionable lesion on MRI, was on Betaseron and Avonex for some period of time.  However MRI in 2014 showed no new lesions and was not suggestive for MS andLP was repeated showed no oligoclonal bands.  NMO antibodies were negative.  Patient was taken off MS drugs.   She has never had other neurological complaints except for right side numbness that has persisted since her diagnosis of optic neuritis.  She has had no new neurological deficits since being taken off medications, however would have flares of worsening right eye vision which would improve with steroids.   Past Medical History:  Diagnosis Date  . ADD (attention deficit disorder)   . Biliary colic   . Bladder leak   . Diabetes mellitus without complication (HCC)    type 2 , diet controlled   . DVT (deep venous thrombosis) (HCC)    left leg , resolved after treatment  of blood thinners   . GERD (gastroesophageal reflux disease)    causes coughing   . Hypertension   . Leg cramps    "in my thighs"   . Migraines   . Multiple sclerosis ([place])    per patient " that ha sbeen ruled out , they though it was that initially but it did not preogress like it"  . Optic neuritis    lesion on right optic nerve; right eye only , reduced vision and blurriness , loss of depth perception, glasses do not help when it flares    . OSA (obstructive sleep apnea)    nightly cpap   . Paresthesias/numbness    fingers and toes   . Pneumonia 2017  . Restless leg syndrome   . Thyroid disease    Hypothyroidism    Past Surgical History:  Procedure Laterality Date  . ABDOMINAL HYSTERECTOMY    . BREAST LUMPECTOMY Left   . CESAREAN SECTION     x2   . CHOLECYSTECTOMY N/A 08/16/2018   Procedure: LAPAROSCOPIC CHOLECYSTECTOMY;  Surgeon: Greer Pickerel, MD;  Location: [facility];  Service: General;  Laterality: N/A;  . MENISCUS REPAIR Left left  . MENISCUS REPAIR Right    knee   . TUBAL LIGATION     during 2nd c section     Family History  Problem  Relation Age of Onset  . Barrett's esophagus Mother   . Hypertension Mother   . Graves' disease Sister   . Rheum arthritis Sister   . Heart attack Father 69  . Coronary artery disease Neg Hx    Social History:  reports that she quit smoking about 34 years ago. She has never used smokeless tobacco. She reports current alcohol use. She reports that she does not use drugs.  Allergies:  Allergies  Allergen Reactions  . Adderall [Amphetamine-Dextroamphetamine] Other (See Comments)    Caused elevated BP  . Topamax [Topiramate]     dizzy    Medications:                                                                                                                        I reviewed home medications   ROS:                                                                                                                                     14 systems  reviewed and negative except above   Examination:                                                                                                      General: Appears well-developed and well-nourished.  Psych: Affect appropriate to situation Eyes: No scleral injection HENT: No OP obstrucion Head: Normocephalic.  Cardiovascular: Normal rate and regular rhythm.  Respiratory: Effort normal and breath sounds normal to anterior ascultation GI: Soft.  No distension. There is no tenderness.  Skin: WDI    Neurological Examination Mental Status: Alert, oriented, thought content appropriate.  Speech fluent without evidence of aphasia. Able to follow 3 step commands without difficulty. Cranial Nerves: II: Visual fields grossly normal in both eyes, color vision intact, undilated fundus exam showed atrophy of right optic nerve, no gross papilledema in the left eye.  III,IV, VI: ptosis not present, extra-ocular motions intact bilaterally, pupils equal, round, reactive to light and accommodation V,VII: smile symmetric, facial light touch sensation  normal bilaterally VIII: hearing normal bilaterally IX,X: uvula rises symmetrically XI: bilateral shoulder shrug XII: midline tongue extension Motor: Right : Upper extremity   5/5    Left:     Upper extremity   5/5  Lower extremity   5/5     Lower extremity   5/5 Tone and bulk:normal tone throughout; no atrophy noted Sensory: reduced sensation to light touch on the right leg Plantars: Right: downgoing   Left: downgoing Cerebellar: normal finger-to-nose, normal rapid alternating movements and normal heel-to-shin test      Lab Results: Basic Metabolic Panel: Recent Labs  Lab 08/27/18 1431 08/27/18 1457  NA 140  --   K 3.8  --   CL 104  --   CO2 27  --   GLUCOSE 113*  --   BUN 11  --   CREATININE 1.04* 1.00  CALCIUM 8.7*  --     CBC: Recent Labs  Lab 08/27/18 1431  WBC 6.3  NEUTROABS 3.8  HGB 11.7*  HCT 37.3  MCV 85.7  PLT 245    Coagulation Studies: No results for input(s): LABPROT, INR in the last 72 hours.  Imaging: Mr Jeri Cos And Wo Contrast  Result Date: 08/27/2018 CLINICAL DATA:  Blurred vision both eyes. History of optic neuritis. EXAM: MRI HEAD WITHOUT AND WITH CONTRAST TECHNIQUE: Multiplanar, multiecho pulse sequences of the brain and surrounding structures were obtained without and with intravenous contrast. CONTRAST:  10 cc Gadavist COMPARISON:  01/30/2017 FINDINGS: Brain: Diffusion imaging does not show any acute or subacute infarction. No abnormality is seen affecting the brainstem. Few old small vessel cerebellar strokes. Cerebral hemispheres show a single small focus of T2 signal in the left basal ganglia which could be an old small vessel stroke or dilated perivascular spaces. This was present previously. No cortical or large vessel territory abnormality. No mass, hemorrhage, hydrocephalus or extra-axial collection. Vascular: Major vessels at the base of the brain show flow. Skull and upper cervical spine: Negative Sinuses/Orbits: Sinuses are clear.   See orbital exam report. Other: None IMPRESSION: No change since the  previous study. No acute brain finding. No evidence of demyelinating disease. Small T2 bright focus in the left basal ganglia was present previously and could represent a small vessel infarction or dilated perivascular space. See results of orbital MRI. Electronically Signed   By: Nelson Chimes M.D.   On: 08/27/2018 18:39   Mr Cervical Spine W Or Wo Contrast  Result Date: 08/27/2018 CLINICAL DATA:  Blurred vision. History of optic neuritis. History of cervical spondylosis. EXAM: MRI CERVICAL SPINE WITHOUT AND WITH CONTRAST TECHNIQUE: Multiplanar and multiecho pulse sequences of the cervical spine, to include the craniocervical junction and cervicothoracic junction, were obtained without and with intravenous contrast. CONTRAST:  10 cc Gadavist COMPARISON:  04/03/2016 FINDINGS: Alignment: Straightening of the normal cervical lordosis with slight kyphotic curvature. One or 2 mm of anterolisthesis C4-5. Vertebrae: No primary bone lesion. Cord: No primary cord lesion. Posterior Fossa, vertebral arteries, paraspinal tissues: Negative Disc levels: No significant abnormality at the foramen magnum or C1-2. The C2-3 disc is normal. There is chronic facet fusion on the left. No canal or foraminal stenosis. C3-4: No disc abnormality.  No facet arthropathy.  No stenosis. C4-5: 1 or 2 mm of anterolisthesis. Facet arthritis on the left with enhancement. Minimal bulging of the disc. No compressive canal stenosis. Foraminal narrowing on the left that could affect the C5 nerve. C5-6: Spondylosis with endplate osteophytes and bulging of the disc more towards the left. Effacement of the subarachnoid space but no compression of the cord. AP diameter of the canal in the midline measures 8 mm. Bilateral bony foraminal narrowing, left more than right. Potential to affect either C6 nerve, more likely the left. C6-7: Endplate osteophytes and bulging of the disc. Narrowing  of the ventral subarachnoid space but no compression of the cord. Bilateral bony foraminal narrowing that could affect either C7 nerve. C7-T1: Normal interspace. Compared to the previous study, no change is evident. IMPRESSION: No evidence of cord lesion. Straightening and slight kyphotic curvature of the cervical spine. One or 2 mm of anterolisthesis at C4-5 due to facet arthropathy. Left foraminal narrowing at C4-5 that could possibly affect the left C5 nerve. C5-6 degenerative spondylosis. Canal narrowing with AP diameter of 8 mm. No cord compression. Foraminal narrowing that could affect either C6 nerve. C6-7 degenerative spondylosis. No cord compression. Foraminal narrowing that could affect either C7 nerve. No demonstrable change since 2018. Electronically Signed   By: Nelson Chimes M.D.   On: 08/27/2018 18:43   Mr Rosealee Albee YF Contrast  Result Date: 08/27/2018 CLINICAL DATA:  Blurred vision on both eyes. History of optic neuritis with visual defects on the right. Symptoms worsening. EXAM: MRI OF THE ORBITS WITHOUT AND WITH CONTRAST TECHNIQUE: Multiplanar, multisequence MR imaging of the orbits was performed both before and after the administration of intravenous contrast. CONTRAST:  10 cc Gadavist COMPARISON:  MRI brain same day.  MRI orbits 01/30/2017. FINDINGS: Both globes are normal. The orbital fat is normal. Extra-ocular muscles are normal. Chronic atrophy of the right optic nerve is again demonstrated, with relative prominence of the CSF in the optic nerve sheath on the right. Neither optic nerve shows evidence of acute T2 hyperintensity or contrast enhancement. Optic chiasm region and the optic radiations appear unremarkable. IMPRESSION: Chronic optic nerve atrophy on the right. No sign of swelling, focal T2 brightness or contrast enhancement to suggest acute optic neuritis. Electronically Signed   By: Nelson Chimes M.D.   On: 08/27/2018 18:46    I have reviewed the above  imaging : MRI Brain, MRI  orbits and MR C spine   ASSESSMENT AND PLAN  AIYAH SCARPELLI is a 63 y.o. female with past medical history of right optic neuritis ( prev diagnosed also with MS) ,migraines, type 2 diabetes mellitus, , hypertension, hypothyroidism, obstructive sleep apnea and recent gallbladder surgery presents to the emergency department with blurry vision in both of her eyes since Saturday.  MRI brain, MRI orbits and MRI C-spine does not show any evidence of active demyelination.  Given both eye involvement and negative MRIs, less likely to be optic neuritis/autoimmune process.  Previously tested for NMO has been negative.  There is no role for steroids in the absence of active inflammation.  The patient is obese, however given fairly sudden onset of symptoms and her age I have a very ow suspicion of this being idiopathic intracranial hypertension.  While dedicated MR venogram was not performed, MRI brain did not show any flow voids in the venous system.  No compressive lesions on MRI Brain also reassuring.  She does complain of mild headache and therefore recommend Depakote as well as 10 mg of Decadron, IV fluids and magnesium.  Hopefully, this presentation is due to migraine and this resolves her symptoms.  Also since there has been no progression of her symptoms since Saturday, I do not think she needs to be admitted as an inpatient for further work-up.  Impression Bilateral vision abnormalities  Recommendations Depakote, 10 mg of Decadron, IV fluids and magnesium Outpatient follow-up with ophthalmology and neurology   Anna Walker [facility] Pager Number [phone]

## 2018-08-27 NOTE — ED Provider Notes (Signed)
[place] EMERGENCY DEPARTMENT Provider Note   CSN: [id number] Arrival date & time: 08/27/18  1409    History   Chief Complaint Chief Complaint  Patient presents with  . Blurred Vision    HPI Anna Walker is a 63 y.o. female.     64 yo F with a chief complaints of blurry vision to both eyes.  Patient has a history of optic neuritis and has a peripheral deficits of vision in her right eye.  Over the past 72 hours or so she noted that her vision in that eye had gotten blurry as well as it was blurry in her left eye.  Her left eye has never had symptoms before.  She denies numbness or tingling difficulty with speech or swallowing.  She called her neurologist who suggested she come to the ED for evaluation.  She tried to go to urgent care first and they referred her here.  She denies headache denies cough congestion or fever.  Denies trauma.  The history is provided by the patient.  Illness Severity:  Mild Onset quality:  Sudden Duration:  3 days Timing:  Constant Progression:  Unchanged Chronicity:  New Associated symptoms: no chest pain, no congestion, no fever, no headaches, no myalgias, no nausea, no rhinorrhea, no shortness of breath, no vomiting and no wheezing     Past Medical History:  Diagnosis Date  . ADD (attention deficit disorder)   . Biliary colic   . Bladder leak   . Diabetes mellitus without complication (HCC)    type 2 , diet controlled   . DVT (deep venous thrombosis) (HCC)    left leg , resolved after treatment of blood thinners   . GERD (gastroesophageal reflux disease)    causes coughing   . Hypertension   . Leg cramps    "in my thighs"   . Migraines   . Multiple sclerosis ([place])    per patient " that ha sbeen ruled out , they though it was that initially but it did not preogress like it"  . Optic neuritis    lesion on right optic nerve; right eye only , reduced vision and blurriness , loss of depth perception, glasses do not  help when it flares    . OSA (obstructive sleep apnea)    nightly cpap   . Paresthesias/numbness    fingers and toes   . Pneumonia 2017  . Restless leg syndrome   . Thyroid disease    Hypothyroidism    Patient Active Problem List   Diagnosis Date Noted  . DOE (dyspnea on exertion)   . Substernal chest pain 02/08/2015  . Attention deficit disorder 12/22/2014  . History of optic neuritis 06/20/2014  . OSA on CPAP 06/20/2014  . Restless leg syndrome 06/20/2014  . Other fatigue 06/20/2014  . Migraine without aura and without status migrainosus, not intractable 06/20/2014  . possible MS 02/24/2014  . Hypothyroidism 02/24/2014  . GERD (gastroesophageal reflux disease) 02/24/2014    Past Surgical History:  Procedure Laterality Date  . ABDOMINAL HYSTERECTOMY    . BREAST LUMPECTOMY Left   . CESAREAN SECTION     x2   . CHOLECYSTECTOMY N/A 08/16/2018   Procedure: LAPAROSCOPIC CHOLECYSTECTOMY;  Surgeon: Greer Pickerel, MD;  Location: [facility];  Service: General;  Laterality: N/A;  . MENISCUS REPAIR Left left  . MENISCUS REPAIR Right    knee   . TUBAL LIGATION     during 2nd c section  OB History   No obstetric history on file.      Home Medications    Prior to Admission medications   Medication Sig Start Date End Date Taking? Authorizing Provider  acetaminophen (TYLENOL) 500 MG tablet Take 1,500 mg by mouth 3 (three) times daily as needed (PAIN.).    [provider]  amantadine (SYMMETREL) 100 MG capsule TAKE 1 CAPSULE(100 MG) BY MOUTH THREE TIMES DAILY Patient taking differently: Take 300 mg by mouth daily.  07/09/18   Pieter Partridge, DO  aspirin (ASPIRIN EC) 81 MG EC tablet Take 81 mg by mouth daily. Swallow whole.    [provider]  FLUoxetine (PROZAC) 10 MG capsule Take 10 mg by mouth at bedtime. Take with 20 mg dose 03/19/17   [provider]  FLUoxetine (PROZAC) 20 MG capsule Take 20 mg by mouth at bedtime. Take with 10 mg dose 06/21/18    [provider]  FLUoxetine (PROZAC) 20 MG tablet Take 20 mg by mouth at bedtime.     [provider]  gabapentin (NEURONTIN) 300 MG capsule Take 1-2 capsules (300-600 mg total) by mouth 3 (three) times daily. 300 mg at dinner and 600 mg at bedtime Patient taking differently: Take 300-600 mg by mouth See admin instructions. Take 1 capsule (300 mg) by mouth at dinner and 2 capsules  (600 mg) at bedtime 03/26/18   Pieter Partridge, DO  hydrochlorothiazide (HYDRODIURIL) 12.5 MG tablet Take 12.5 mg by mouth daily.  02/06/17   [provider]  levothyroxine (SYNTHROID, LEVOTHROID) 150 MCG tablet Take 150 mcg by mouth daily before breakfast.    [provider]  omeprazole (PRILOSEC) 40 MG capsule Take 1 capsule (40 mg total) by mouth 2 (two) times daily before a meal. 02/10/15   Barton Dubois, MD  ondansetron (ZOFRAN) 4 MG tablet Take 4 mg by mouth every 8 (eight) hours as needed for nausea/vomiting. 07/02/18   [provider]  oxybutynin (DITROPAN) 5 MG tablet Take 5 mg by mouth every evening.     [provider]  oxyCODONE (OXY IR/ROXICODONE) 5 MG immediate release tablet Take 1 tablet (5 mg total) by mouth every 6 (six) hours as needed for severe pain. 08/16/18   Greer Pickerel, MD  rOPINIRole (REQUIP) 3 MG tablet TAKE 1 TABLET BY MOUTH DAILY AT [facility] AND 1 TABLET AT BEDTIME.  PLEASE CALL [phone] TO SCHEDULE AN APPT. Patient taking differently: Take 3 mg by mouth at bedtime.  02/02/18   Sater, Nanine Means, MD  simvastatin (ZOCOR) 10 MG tablet Take 10 mg by mouth every evening.     [provider]  SUMAtriptan (IMITREX) 100 MG tablet Take 1 tablet (100 mg total) by mouth once as needed for migraine. May repeat in 2 hours if headache persists or recurs. 12/24/15   Sater, Nanine Means, MD    Family History Family History  Problem Relation Age of Onset  . Barrett's esophagus Mother   . Hypertension Mother   . Graves' disease Sister   . Rheum arthritis  Sister   . Heart attack Father 64  . Coronary artery disease Neg Hx     Social History Social History   Tobacco Use  . Smoking status: Former Smoker    Quit date: 01/25/1984    Years since quitting: 34.6  . Smokeless tobacco: Never Used  Substance Use Topics  . Alcohol use: Yes    Comment: 3 wine cooler a week   . Drug use: No  Allergies   Adderall [amphetamine-dextroamphetamine] and Topamax [topiramate]   Review of Systems Review of Systems  Constitutional: Negative for chills and fever.  HENT: Negative for congestion and rhinorrhea.   Eyes: Positive for visual disturbance. Negative for redness.  Respiratory: Negative for shortness of breath and wheezing.   Cardiovascular: Negative for chest pain and palpitations.  Gastrointestinal: Negative for nausea and vomiting.  Genitourinary: Negative for dysuria and urgency.  Musculoskeletal: Negative for arthralgias and myalgias.  Skin: Negative for pallor and wound.  Neurological: Negative for dizziness and headaches.     Physical Exam Updated Vital Signs BP 125/65 (BP Location: Right Arm)   Pulse 67   Temp 99.1 F (37.3 C)   Resp 16   SpO2 98%   Physical Exam Vitals signs and nursing note reviewed.  Constitutional:      General: She is not in acute distress.    Appearance: She is well-developed. She is not diaphoretic.  HENT:     Head: Normocephalic and atraumatic.  Eyes:     Pupils: Pupils are equal, round, and reactive to light.  Neck:     Musculoskeletal: Normal range of motion and neck supple.  Cardiovascular:     Rate and Rhythm: Normal rate and regular rhythm.     Heart sounds: No murmur. No friction rub. No gallop.   Pulmonary:     Effort: Pulmonary effort is normal.     Breath sounds: No wheezing or rales.  Abdominal:     General: There is no distension.     Palpations: Abdomen is soft.     Tenderness: There is no abdominal tenderness.  Musculoskeletal:        General: No tenderness.  Skin:     General: Skin is warm and dry.  Neurological:     Mental Status: She is alert and oriented to person, place, and time.     Cranial Nerves: Cranial nerves are intact.     Sensory: Sensation is intact.     Motor: Motor function is intact.     Coordination: Coordination is intact.  Psychiatric:        Behavior: Behavior normal.      ED Treatments / Results  Labs (all labs ordered are listed, but only abnormal results are displayed) Labs Reviewed  CBC WITH DIFFERENTIAL/PLATELET  COMPREHENSIVE METABOLIC PANEL  I-STAT CREATININE, ED    EKG None  Radiology No results found.  Procedures Procedures (including critical care time)  Medications Ordered in ED Medications - No data to display   Initial Impression / Assessment and Plan / ED Course  I have reviewed the triage vital signs and the nursing notes.  Pertinent labs & imaging results that were available during my care of the patient were reviewed by me and considered in my medical decision making (see chart for details).        63 yo F with a chief complaints of blurred vision.  Going on for about 72 hours now.  No other neurologic deficits.  Patient has a history of a MS-like syndrome that affects her right eye.  Has symptoms in the left eye now that she is never had before.  I called her neurologist on the phone today who referred her to the ED.  Will obtain lab work.  Discussed with neurology, Dr Leonel Ramsay, recommends MR brain, orbits and C spine.   Signed out to Dr. Mariane Masters, please see his note for further details or care.   The patients results and plan were  reviewed and discussed.   Any x-rays performed were independently reviewed by myself.   Differential diagnosis were considered with the presenting HPI.  Medications - No data to display  Vitals:   08/27/18 1416 08/27/18 1438  BP: 133/72 125/65  Pulse: 82 67  Resp: 17 16  Temp: 98 F (36.7 C) 99.1 F (37.3 C)  TempSrc: Oral   SpO2: 99% 98%     Final diagnoses:  Optic neuritis      Final Clinical Impressions(s) / ED Diagnoses   Final diagnoses:  Optic neuritis    ED Discharge Orders    None       Deno Etienne, DO 08/27/18 1507

## 2018-08-27 NOTE — Telephone Encounter (Signed)
Returned Pt's at 3:50pm. She answered call and said she was in the ED and will call back if needed.

## 2018-08-27 NOTE — ED Notes (Signed)
Neurology at bedside.

## 2018-08-27 NOTE — ED Notes (Signed)
Pt to go to ED for further eval of blurred vision with eye hx

## 2018-08-27 NOTE — ED Notes (Signed)
Pt back from MRI 

## 2018-08-28 ENCOUNTER — Telehealth: Payer: Self-pay | Admitting: Neurology

## 2018-08-28 NOTE — Telephone Encounter (Signed)
Pt went to the ED yesterday and they told her to follow up with [place] this week. He does not have any appointments at this time. She does have one on 09-21-18 to see him. She states that he would might want to call in her in some medication to help get her to the appt date. She uses the [facility] on [address]

## 2018-08-28 NOTE — Telephone Encounter (Signed)
Called and advised Pt, per Dr. Tomi Likens, ok to wait until 8/28. Pt was concerned she did not have anything to take in the event she started having sever pain again, states her right eye is still bothering her today. We went over her medications, I asked if she was still taking oxycodone, she stated it was given to her after her gallbladder surgery and she never used it. I advised her she could try that for severe pain if needed.

## 2018-09-01 DIAGNOSIS — G4733 Obstructive sleep apnea (adult) (pediatric): Secondary | ICD-10-CM | POA: Diagnosis not present

## 2018-09-21 ENCOUNTER — Encounter: Payer: Self-pay | Admitting: Neurology

## 2018-09-21 ENCOUNTER — Other Ambulatory Visit: Payer: Self-pay

## 2018-09-21 ENCOUNTER — Ambulatory Visit: Payer: PPO | Admitting: Neurology

## 2018-09-21 VITALS — BP 137/78 | HR 80 | Temp 98.5°F | Ht 66.0 in | Wt 285.0 lb

## 2018-09-21 DIAGNOSIS — G43009 Migraine without aura, not intractable, without status migrainosus: Secondary | ICD-10-CM | POA: Diagnosis not present

## 2018-09-21 DIAGNOSIS — H538 Other visual disturbances: Secondary | ICD-10-CM

## 2018-09-21 DIAGNOSIS — Z8669 Personal history of other diseases of the nervous system and sense organs: Secondary | ICD-10-CM

## 2018-09-21 MED ORDER — SUMATRIPTAN SUCCINATE 100 MG PO TABS: 100.0000 mg | ORAL_TABLET | Freq: Once | ORAL | 5 refills | Status: DC | PRN

## 2018-09-21 NOTE — Progress Notes (Signed)
NEUROLOGY FOLLOW UP OFFICE NOTE  Anna Walker SK:[id number]  HISTORY OF PRESENT ILLNESS: Anna Walker is a 63 year old woman with ADD, fatigue, hypertension, restless leg syndrome, migraines, hyperlipidemia, type 2 diabetes mellitus, OSA, hypothyroidism and history of right optic neuritis who follows up for migraines.  UPDATE: She was seen in the ED on 08/27/18 with 1 day of bilateral blurred vision, of gradual onset. It was monocular in either eye.  She first noticed it because she had trouble driving.  She had some right eye ache and dizziness but no headache or new numbness and tingling   MRI of brain/orbits and cervical spine with and without contrast appeared stable and showed no active demyelinating lesions or abnormal signal of optic nerves.  She was diagnosed by in-house neurology with migraine and was treated with migraine cocktail of toradol and Reglan.  She doesn't have vertigo but she feels "insecure" and problems with balance when ambulating.  Blurred vision is unchanged.  She has appointment with Dr. Gershon Walker next week.   Typical migraines have been well-controlled.  She cannot recall last migraine prior to going to ED.  Again, she reports burning in her hands (they feel hot).  This is chronic.    HISTORY: I RIGHT OPTIC NEURITISAND OTHER RECURRENT SYMPTOMS WITH QUESTIONABLE MULTIPLE SCLEROSIS: She had an episode of right optic neuritis in 1998, presenting first as blurred vision and then complete vision loss.A couple of weeks later, she developed right sided numbness of the face, arm and leg.She underwent a lumbar puncture which demonstrated one oligoclonal band. MRI of brain reportedly may have shown foci which may be concerning for MS. MRI cervical spine reportedly did not demonstrate demyelinating lesions. She was diagnosed with multiple sclerosis and treatedwith IV steroids. The vision loss lasted 7-8 weeks. The numbness lasted several days. She was treated with  DMT for several years. She started on Betaseron but was then switched to Copaxone and then Avonex due to intolerability. She had a repeat LP in 2002 which showed no oligoclonal bands and normal IgG index of 0.6. Visual evoked potential in 2013 showed absent right response and normal left response. Repeat MRI from July 2014 was thought to be essentially normal so Avonex was discontinued. Repeat MRI in November 2014 was unchanged. Therefore, it was believed that she did not have MS. NMO-IgG antibodies from 02/24/14 were negative.MRI of cervical spine without contrast from 03/24/16 was personally reviewed and revealed cervical spondylosis but no abnormal cord signal.  Since the episode in 1998, she endorses subtle weakness on the right side of her body. For example, when she starts to write, her handwriting will start to get worse. She also reports short-term memory deficits since 1998. Over the years, she has had recurrent episodes of right eye pain with right sided numbness. In the past, they were thought to be MS flares which were treated with steroids. They occur when she is heated or fatigued. They typically occur at least once a year and they last about 2 days. She last had an episode in early January 2019, for which she was evaluated in the ED on 01/30/17. She had an MRI of the brain and orbits with and without contrast, which was personally reviewed and demonstrated asymmetrically small right optic nerve compatible with sequelae of prior optic neuritis but no enhancement, as well as three nonspecific tiny hyperintense T2/FLAIR foci in the bifrontal periventricular white matter.All of her symptoms have not progressed over the years and recurrence of these episodes have not  increased since discontinuation of Avonex.  She has pain in the legs and gait issues. She does have arthritis in the knees, which required surgery and had a subsequent DVT. She is treated for restless leg syndrome, for  which she takes ropinirole and gabapentin.  She also reports chronic fatigue. She has OSA and is on CPAP. She takes amantadine.  II MIGRAINES She has history of migraines since early adulthood. They are severe bifrontal pain associated with nausea, photophobia and phonophobia but no vomiting, new visual disturbance or unilateral numbness or weakness. They last 1 to 2 days and usually occur once a year.  There are no specific triggers or relieving factors however eye gets more blurred when fatigued or with drop in blood sugar.  She also reports severe sharp right retro-orbital pain in September 2019. Constant but fluctuates. There is associated blurred vision in right eye, dizziness,nausea, photophobia and phonophobia, eye twitching and fatigue. No associated right sided numbness and weakness.  Current NSAIDS: ASA 81mg  daily Current analgesics:no Current triptans: sumatriptan 100mg  Current anti-emetic:no Current muscle relaxants:no Current anti-anxiolytic:no Current sleep aide:no Current Antihypertensive medications: HCTZ Current Antidepressant medications: fluoxetine 30mg  Current Anticonvulsant medications: gabapentin 300mg  evening and 600mg  at bedtime Other medications: Ropinirole 3mg  dinner and 3mg  bedtime, amantadine 100mg  three times daily (fatigue)   Past medication:  topiramate  PAST MEDICAL HISTORY: Past Medical History:  Diagnosis Date  . ADD (attention deficit disorder)   . Biliary colic   . Bladder leak   . Diabetes mellitus without complication (HCC)    type 2 , diet controlled   . DVT (deep venous thrombosis) (HCC)    left leg , resolved after treatment of blood thinners   . GERD (gastroesophageal reflux disease)    causes coughing   . Hypertension   . Leg cramps    "in my thighs"   . Migraines   . Multiple sclerosis ([place])    per patient " that ha sbeen ruled out , they though it was that initially but it did not preogress like it"  .  Optic neuritis    lesion on right optic nerve; right eye only , reduced vision and blurriness , loss of depth perception, glasses do not help when it flares    . OSA (obstructive sleep apnea)    nightly cpap   . Paresthesias/numbness    fingers and toes   . Pneumonia 2017  . Restless leg syndrome   . Thyroid disease    Hypothyroidism    MEDICATIONS: Current Outpatient Medications on File Prior to Visit  Medication Sig Dispense Refill  . acetaminophen (TYLENOL) 500 MG tablet Take 1,500 mg by mouth 3 (three) times daily as needed (PAIN.).    Marland Kitchen amantadine (SYMMETREL) 100 MG capsule TAKE 1 CAPSULE(100 MG) BY MOUTH THREE TIMES DAILY (Patient taking differently: Take 300 mg by mouth daily. ) 270 capsule 3  . aspirin (ASPIRIN EC) 81 MG EC tablet Take 81 mg by mouth daily. Swallow whole.    Marland Kitchen FLUoxetine (PROZAC) 10 MG capsule Take 10 mg by mouth at bedtime. Take with 20 mg dose    . FLUoxetine (PROZAC) 20 MG capsule Take 20 mg by mouth at bedtime. Take with 10 mg dose    . FLUoxetine (PROZAC) 20 MG tablet Take 20 mg by mouth at bedtime.     . gabapentin (NEURONTIN) 300 MG capsule Take 1-2 capsules (300-600 mg total) by mouth 3 (three) times daily. 300 mg at dinner and 600 mg at bedtime (Patient  taking differently: Take 300-600 mg by mouth See admin instructions. Take 1 capsule (300 mg) by mouth at dinner and 2 capsules  (600 mg) at bedtime) 270 capsule 4  . hydrochlorothiazide (HYDRODIURIL) 12.5 MG tablet Take 12.5 mg by mouth daily.     Marland Kitchen levothyroxine (SYNTHROID, LEVOTHROID) 150 MCG tablet Take 150 mcg by mouth daily before breakfast.    . omeprazole (PRILOSEC) 40 MG capsule Take 1 capsule (40 mg total) by mouth 2 (two) times daily before a meal. 60 capsule 1  . ondansetron (ZOFRAN) 4 MG tablet Take 4 mg by mouth every 8 (eight) hours as needed for nausea/vomiting.    Marland Kitchen oxybutynin (DITROPAN) 5 MG tablet Take 5 mg by mouth every evening.     Marland Kitchen oxyCODONE (OXY IR/ROXICODONE) 5 MG immediate release  tablet Take 1 tablet (5 mg total) by mouth every 6 (six) hours as needed for severe pain. 15 tablet 0  . rOPINIRole (REQUIP) 3 MG tablet TAKE 1 TABLET BY MOUTH DAILY AT DINNER AND 1 TABLET AT BEDTIME.  PLEASE CALL [phone] TO SCHEDULE AN APPT. (Patient taking differently: Take 3 mg by mouth at bedtime. ) 180 tablet 0  . simvastatin (ZOCOR) 10 MG tablet Take 10 mg by mouth every evening.     . SUMAtriptan (IMITREX) 100 MG tablet Take 1 tablet (100 mg total) by mouth once as needed for migraine. May repeat in 2 hours if headache persists or recurs. 10 tablet 5   No current facility-administered medications on file prior to visit.     ALLERGIES: Allergies  Allergen Reactions  . Adderall [Amphetamine-Dextroamphetamine] Other (See Comments)    Caused elevated BP  . Topamax [Topiramate]     dizzy    FAMILY HISTORY: Family History  Problem Relation Age of Onset  . Barrett's esophagus Mother   . Hypertension Mother   . Graves' disease Sister   . Rheum arthritis Sister   . Heart attack Father 38  . Coronary artery disease Neg Hx    SOCIAL HISTORY: Social History   Socioeconomic History  . Marital status: Married    Spouse name: Eddie Dibbles  . Number of children: 2  . Years of education: Not on file  . Highest education level: Some college, no degree  Occupational History  . Occupation: disabled  Social Needs  . Financial resource strain: Not on file  . Food insecurity    Worry: Not on file    Inability: Not on file  . Transportation needs    Medical: Not on file    Non-medical: Not on file  Tobacco Use  . Smoking status: Former Smoker    Quit date: 01/25/1984    Years since quitting: 34.6  . Smokeless tobacco: Never Used  Substance and Sexual Activity  . Alcohol use: Yes    Comment: 3 wine cooler a week   . Drug use: No  . Sexual activity: Yes    Birth control/protection: Surgical  Lifestyle  . Physical activity    Days per week: Not on file    Minutes per session: Not on  file  . Stress: Not on file  Relationships  . Social Herbalist on phone: Not on file    Gets together: Not on file    Attends religious service: Not on file    Active member of club or organization: Not on file    Attends meetings of clubs or organizations: Not on file    Relationship status: Not on file  .  Intimate partner violence    Fear of current or ex partner: Not on file    Emotionally abused: Not on file    Physically abused: Not on file    Forced sexual activity: Not on file  Other Topics Concern  . Not on file  Social History Narrative   Pt is right handed. She is married, lives with her husband in a 1 story house, ramp to enter. She drinks 2 cups of coffee a day.    REVIEW OF SYSTEMS: Constitutional: No fevers, chills, or sweats, no generalized fatigue, change in appetite Eyes: No visual changes, double vision, eye pain Ear, nose and throat: No hearing loss, ear pain, nasal congestion, sore throat Cardiovascular: No chest pain, palpitations Respiratory:  No shortness of breath at rest or with exertion, wheezes GastrointestinaI: No nausea, vomiting, diarrhea, abdominal pain, fecal incontinence Genitourinary:  No dysuria, urinary retention or frequency Musculoskeletal:  No neck pain, back pain Integumentary: No rash, pruritus, skin lesions Neurological: as above Psychiatric: No depression, insomnia, anxiety Endocrine: No palpitations, fatigue, diaphoresis, mood swings, change in appetite, change in weight, increased thirst Hematologic/Lymphatic:  No purpura, petechiae. Allergic/Immunologic: no itchy/runny eyes, nasal congestion, recent allergic reactions, rashes  PHYSICAL EXAM: Blood pressure 137/78, pulse 80, temperature 98.5 F (36.9 C), height 5\' 6"  (1.676 m), weight 285 lb (129.3 kg), SpO2 98 %. General: No acute distress.  Patient appears well-groomed.   Head:  Normocephalic/atraumatic Eyes:  Fundi examined but not visualized Neck: supple, no  paraspinal tenderness, full range of motion Heart:  Regular rate and rhythm Lungs:  Clear to auscultation bilaterally Back: No paraspinal tenderness Neurological Exam: alert and oriented to person, place, and time. Attention span and concentration intact, recent and remote memory intact, fund of knowledge intact.  Speech fluent and not dysarthric, language intact.  CN II-XII intact. Bulk and tone normal, muscle strength 5/5 throughout.  Sensation to temperature and vibration intact.  Deep tendon reflexes absent throughout, toes downgoing.  Finger to nose  testing intact.  Antalgic gait with cane. Romberg negative.  IMPRESSION: 1.  Bilateral blurred vision with history of optic neuritis.  She has follow up with her ophthalmologist next week. 2.  migraine with aura, without status migrainosus, not intractable, stable 3.  Recurrent symptoms with right sided numbness and weakness, likely complicated migraine. Based on testing that has been performed, I do not believe Ms. Lettiere has multiple sclerosis. CSF analysis revealed 1 oligoclonal band, which is not a significant number. She has had repeated MRIs over the years with minimal (3 ) tiny hyperintense foci in the periventricular white matter which have been stable. Given her longstanding history with recurrent flare-ups, I would expect radiographic evidence of disease progression on MRI. She also does not exhibit abnormal findings in other regions of the CNS, such as supratentorial region or spinal cord.  4.  Hot sensation in hands.  Chronic.  She has diabetes and hypothyroidism, which are controlled.  However, these symptoms are not consistent with a polyneuropathy.  PLAN: 1.  Follow up with Dr. Gershon Walker and requested to have his note sent to me. 2.  Refilled sumatriptan 3.  Recommend checking B12 to further evaluate burning in hands.  She will ask to have done at Dr. Delene Ruffini office next week. 4.  Otherwise, follow up in 6 months.  25 minutes  spent face to face with patient, over 50% spent discussing management.  Metta Clines, DO  CC: Kelton Pillar, MD

## 2018-09-21 NOTE — Patient Instructions (Signed)
1.  Refilled sumatriptan 2.  Follow up with Dr. Gershon Crane and have him send his note to me. 3.  Ask Dr. Laurann Montana to check B12 regarding hand burning 4.  Follow up in 6 months.

## 2018-09-25 DIAGNOSIS — H538 Other visual disturbances: Secondary | ICD-10-CM | POA: Diagnosis not present

## 2018-09-25 DIAGNOSIS — H25043 Posterior subcapsular polar age-related cataract, bilateral: Secondary | ICD-10-CM | POA: Diagnosis not present

## 2018-09-25 DIAGNOSIS — H47291 Other optic atrophy, right eye: Secondary | ICD-10-CM | POA: Diagnosis not present

## 2018-09-25 DIAGNOSIS — H2513 Age-related nuclear cataract, bilateral: Secondary | ICD-10-CM | POA: Diagnosis not present

## 2018-09-26 DIAGNOSIS — R32 Unspecified urinary incontinence: Secondary | ICD-10-CM | POA: Diagnosis not present

## 2018-09-26 DIAGNOSIS — Z0000 Encounter for general adult medical examination without abnormal findings: Secondary | ICD-10-CM | POA: Diagnosis not present

## 2018-09-26 DIAGNOSIS — J309 Allergic rhinitis, unspecified: Secondary | ICD-10-CM | POA: Diagnosis not present

## 2018-09-26 DIAGNOSIS — E1169 Type 2 diabetes mellitus with other specified complication: Secondary | ICD-10-CM | POA: Diagnosis not present

## 2018-09-26 DIAGNOSIS — G2581 Restless legs syndrome: Secondary | ICD-10-CM | POA: Diagnosis not present

## 2018-09-26 DIAGNOSIS — G35 Multiple sclerosis: Secondary | ICD-10-CM | POA: Diagnosis not present

## 2018-09-26 DIAGNOSIS — E785 Hyperlipidemia, unspecified: Secondary | ICD-10-CM | POA: Diagnosis not present

## 2018-09-26 DIAGNOSIS — K219 Gastro-esophageal reflux disease without esophagitis: Secondary | ICD-10-CM | POA: Diagnosis not present

## 2018-09-26 DIAGNOSIS — F39 Unspecified mood [affective] disorder: Secondary | ICD-10-CM | POA: Diagnosis not present

## 2018-09-26 DIAGNOSIS — I10 Essential (primary) hypertension: Secondary | ICD-10-CM | POA: Diagnosis not present

## 2018-09-26 DIAGNOSIS — E039 Hypothyroidism, unspecified: Secondary | ICD-10-CM | POA: Diagnosis not present

## 2018-09-26 DIAGNOSIS — G43909 Migraine, unspecified, not intractable, without status migrainosus: Secondary | ICD-10-CM | POA: Diagnosis not present

## 2018-09-28 DIAGNOSIS — E039 Hypothyroidism, unspecified: Secondary | ICD-10-CM | POA: Diagnosis not present

## 2018-09-28 DIAGNOSIS — Z23 Encounter for immunization: Secondary | ICD-10-CM | POA: Diagnosis not present

## 2018-09-28 DIAGNOSIS — E1169 Type 2 diabetes mellitus with other specified complication: Secondary | ICD-10-CM | POA: Diagnosis not present

## 2018-10-02 DIAGNOSIS — G4733 Obstructive sleep apnea (adult) (pediatric): Secondary | ICD-10-CM | POA: Diagnosis not present

## 2018-10-16 DIAGNOSIS — E1169 Type 2 diabetes mellitus with other specified complication: Secondary | ICD-10-CM | POA: Diagnosis not present

## 2018-10-18 ENCOUNTER — Emergency Department (HOSPITAL_COMMUNITY)
Admission: EM | Admit: 2018-10-18 | Discharge: 2018-10-19 | Disposition: A | Discharge status: Home / Self Care | Payer: PPO | Attending: Emergency Medicine | Admitting: Emergency Medicine

## 2018-10-18 ENCOUNTER — Other Ambulatory Visit: Payer: Self-pay

## 2018-10-18 ENCOUNTER — Encounter (HOSPITAL_COMMUNITY): Payer: Self-pay | Admitting: Emergency Medicine

## 2018-10-18 DIAGNOSIS — R300 Dysuria: Secondary | ICD-10-CM | POA: Diagnosis not present

## 2018-10-18 DIAGNOSIS — E119 Type 2 diabetes mellitus without complications: Secondary | ICD-10-CM | POA: Insufficient documentation

## 2018-10-18 DIAGNOSIS — Z79899 Other long term (current) drug therapy: Secondary | ICD-10-CM | POA: Insufficient documentation

## 2018-10-18 DIAGNOSIS — F909 Attention-deficit hyperactivity disorder, unspecified type: Secondary | ICD-10-CM | POA: Diagnosis not present

## 2018-10-18 DIAGNOSIS — Z87891 Personal history of nicotine dependence: Secondary | ICD-10-CM | POA: Insufficient documentation

## 2018-10-18 DIAGNOSIS — Z86718 Personal history of other venous thrombosis and embolism: Secondary | ICD-10-CM | POA: Insufficient documentation

## 2018-10-18 DIAGNOSIS — E039 Hypothyroidism, unspecified: Secondary | ICD-10-CM | POA: Insufficient documentation

## 2018-10-18 DIAGNOSIS — K5792 Diverticulitis of intestine, part unspecified, without perforation or abscess without bleeding: Secondary | ICD-10-CM | POA: Insufficient documentation

## 2018-10-18 DIAGNOSIS — Z9049 Acquired absence of other specified parts of digestive tract: Secondary | ICD-10-CM | POA: Diagnosis not present

## 2018-10-18 DIAGNOSIS — M549 Dorsalgia, unspecified: Secondary | ICD-10-CM | POA: Insufficient documentation

## 2018-10-18 DIAGNOSIS — Z7982 Long term (current) use of aspirin: Secondary | ICD-10-CM | POA: Diagnosis not present

## 2018-10-18 DIAGNOSIS — I10 Essential (primary) hypertension: Secondary | ICD-10-CM | POA: Insufficient documentation

## 2018-10-18 DIAGNOSIS — K5732 Diverticulitis of large intestine without perforation or abscess without bleeding: Secondary | ICD-10-CM | POA: Diagnosis not present

## 2018-10-18 DIAGNOSIS — R1032 Left lower quadrant pain: Secondary | ICD-10-CM | POA: Diagnosis present

## 2018-10-18 LAB — CBC
HCT: 40.1 % (ref 36.0–46.0)
Hemoglobin: 12.7 g/dL (ref 12.0–15.0)
MCH: 27.4 pg (ref 26.0–34.0)
MCHC: 31.7 g/dL (ref 30.0–36.0)
MCV: 86.4 fL (ref 80.0–100.0)
Platelets: 280 10*3/uL (ref 150–400)
RBC: 4.64 MIL/uL (ref 3.87–5.11)
RDW: 13.9 % (ref 11.5–15.5)
WBC: 11.1 10*3/uL — ABNORMAL HIGH (ref 4.0–10.5)
nRBC: 0 % (ref 0.0–0.2)

## 2018-10-18 LAB — COMPREHENSIVE METABOLIC PANEL
ALT: 17 U/L (ref 0–44)
AST: 15 U/L (ref 15–41)
Albumin: 3.9 g/dL (ref 3.5–5.0)
Alkaline Phosphatase: 81 U/L (ref 38–126)
Anion gap: 11 (ref 5–15)
BUN: 13 mg/dL (ref 6–20)
CO2: 25 mmol/L (ref 22–32)
Calcium: 9.2 mg/dL (ref 8.9–10.3)
Chloride: 103 mmol/L (ref 98–111)
Creatinine, Ser: 0.94 mg/dL (ref 0.44–1.00)
GFR calc Af Amer: >60 mL/min (ref >60)
GFR calc non Af Amer: >60 mL/min (ref >60)
Glucose, Bld: 97 mg/dL (ref 70–99)
Potassium: 4.1 mmol/L (ref 3.5–5.1)
Sodium: 139 mmol/L (ref 135–145)
Total Bilirubin: 0.7 mg/dL (ref 0.3–1.2)
Total Protein: 7.2 g/dL (ref 6.5–8.1)

## 2018-10-18 LAB — URINALYSIS, ROUTINE W REFLEX MICROSCOPIC
Bilirubin Urine: NEGATIVE
Glucose, UA: NEGATIVE mg/dL
Ketones, ur: NEGATIVE mg/dL
Leukocytes,Ua: NEGATIVE
Nitrite: NEGATIVE
Protein, ur: NEGATIVE mg/dL
Specific Gravity, Urine: 1.013 (ref 1.005–1.030)
pH: 6 (ref 5.0–8.0)

## 2018-10-18 LAB — LIPASE, BLOOD: Lipase: 23 U/L (ref 11–51)

## 2018-10-18 MED ORDER — SODIUM CHLORIDE 0.9% FLUSH: 3.0000 mL | Freq: Once | INTRAVENOUS | Status: DC

## 2018-10-18 NOTE — ED Triage Notes (Signed)
Pt states she has had abd pain and back pain since Saturday. Pt endorses nausea. Pt states her abd pain worsens when she urinates and has bm. Pt was seen by primary and referred here for further testing.

## 2018-10-19 ENCOUNTER — Encounter (HOSPITAL_COMMUNITY): Payer: Self-pay | Admitting: Radiology

## 2018-10-19 ENCOUNTER — Emergency Department (HOSPITAL_COMMUNITY): Payer: PPO

## 2018-10-19 DIAGNOSIS — K5732 Diverticulitis of large intestine without perforation or abscess without bleeding: Secondary | ICD-10-CM | POA: Diagnosis not present

## 2018-10-19 MED ORDER — ONDANSETRON HCL 4 MG/2ML IJ SOLN
4.0000 mg | Freq: Once | INTRAMUSCULAR | Status: AC
  Administered 2018-10-19: 4 mg via INTRAVENOUS
  Filled 2018-10-19: qty 2

## 2018-10-19 MED ORDER — IOHEXOL 300 MG/ML  SOLN
100.0000 mL | Freq: Once | INTRAMUSCULAR | Status: AC | PRN
  Administered 2018-10-19: 100 mL via INTRAVENOUS

## 2018-10-19 MED ORDER — HYDROCODONE-ACETAMINOPHEN 5-325 MG PO TABS: 1.0000 | ORAL_TABLET | Freq: Four times a day (QID) | ORAL | 0 refills | Status: DC | PRN

## 2018-10-19 MED ORDER — SODIUM CHLORIDE 0.9 % IV BOLUS
1000.0000 mL | Freq: Once | INTRAVENOUS | Status: AC
  Administered 2018-10-19: 1000 mL via INTRAVENOUS

## 2018-10-19 MED ORDER — CIPROFLOXACIN IN D5W 400 MG/200ML IV SOLN
400.0000 mg | Freq: Once | INTRAVENOUS | Status: AC
  Administered 2018-10-19: 400 mg via INTRAVENOUS
  Filled 2018-10-19: qty 200

## 2018-10-19 MED ORDER — CIPROFLOXACIN HCL 500 MG PO TABS: 500.0000 mg | ORAL_TABLET | Freq: Two times a day (BID) | ORAL | 0 refills | Status: DC

## 2018-10-19 MED ORDER — MORPHINE SULFATE (PF) 4 MG/ML IV SOLN
4.0000 mg | Freq: Once | INTRAVENOUS | Status: AC
  Administered 2018-10-19: 4 mg via INTRAVENOUS
  Filled 2018-10-19: qty 1

## 2018-10-19 MED ORDER — METRONIDAZOLE 500 MG PO TABS: 500.0000 mg | ORAL_TABLET | Freq: Two times a day (BID) | ORAL | 0 refills | Status: DC

## 2018-10-19 MED ORDER — METRONIDAZOLE IN NACL 5-0.79 MG/ML-% IV SOLN
500.0000 mg | Freq: Once | INTRAVENOUS | Status: AC
  Administered 2018-10-19: 500 mg via INTRAVENOUS
  Filled 2018-10-19: qty 100

## 2018-10-19 NOTE — ED Provider Notes (Signed)
[facility] EMERGENCY DEPARTMENT Provider Note   CSN: [id number] Arrival date & time: 10/18/18  1841     History   Chief Complaint Chief Complaint  Patient presents with  . Abdominal Pain  . Back Pain    HPI Anna Walker is a 63 y.o. female.     Patient presents to the ED with a chief complaint of abdominal pain.  She states that she has been having the symptoms for the past week.  She reports pain when she urinates and when she defecates.  She states she has only had small BMs.  She denies any fevers or chills.  She does report associated nausea, but no vomiting or diarrhea.  She states that the pain is in her back and wraps all around her belly.  Of note, she is S/P 3 months cholecystectomy.  The history is provided by the patient. No language interpreter was used.    Past Medical History:  Diagnosis Date  . ADD (attention deficit disorder)   . Biliary colic   . Bladder leak   . Diabetes mellitus without complication (HCC)    type 2 , diet controlled   . DVT (deep venous thrombosis) (HCC)    left leg , resolved after treatment of blood thinners   . GERD (gastroesophageal reflux disease)    causes coughing   . Hypertension   . Leg cramps    "in my thighs"   . Migraines   . Multiple sclerosis ([place])    per patient " that ha sbeen ruled out , they though it was that initially but it did not preogress like it"  . Optic neuritis    lesion on right optic nerve; right eye only , reduced vision and blurriness , loss of depth perception, glasses do not help when it flares    . OSA (obstructive sleep apnea)    nightly cpap   . Paresthesias/numbness    fingers and toes   . Pneumonia 2017  . Restless leg syndrome   . Thyroid disease    Hypothyroidism    Patient Active Problem List   Diagnosis Date Noted  . DOE (dyspnea on exertion)   . Substernal chest pain 02/08/2015  . Attention deficit disorder 12/22/2014  . History of optic neuritis  06/20/2014  . OSA on CPAP 06/20/2014  . Restless leg syndrome 06/20/2014  . Other fatigue 06/20/2014  . Migraine without aura and without status migrainosus, not intractable 06/20/2014  . possible MS 02/24/2014  . Hypothyroidism 02/24/2014  . GERD (gastroesophageal reflux disease) 02/24/2014    Past Surgical History:  Procedure Laterality Date  . ABDOMINAL HYSTERECTOMY    . BREAST LUMPECTOMY Left   . CESAREAN SECTION     x2   . CHOLECYSTECTOMY N/A 08/16/2018   Procedure: LAPAROSCOPIC CHOLECYSTECTOMY;  Surgeon: Greer Pickerel, MD;  Location: [facility];  Service: General;  Laterality: N/A;  . MENISCUS REPAIR Left left  . MENISCUS REPAIR Right    knee   . TUBAL LIGATION     during 2nd c section      OB History   No obstetric history on file.      Home Medications    Prior to Admission medications   Medication Sig Start Date End Date Taking? Authorizing Provider  acetaminophen (TYLENOL) 500 MG tablet Take 1,500 mg by mouth 3 (three) times daily as needed (PAIN.).    [provider]  amantadine (SYMMETREL) 100 MG capsule TAKE 1 CAPSULE(100 MG) BY  MOUTH THREE TIMES DAILY Patient taking differently: Take 300 mg by mouth daily.  07/09/18   Pieter Partridge, DO  aspirin (ASPIRIN EC) 81 MG EC tablet Take 81 mg by mouth daily. Swallow whole.    [provider]  FLUoxetine (PROZAC) 10 MG capsule Take 10 mg by mouth at bedtime. Take with 20 mg dose 03/19/17   [provider]  FLUoxetine (PROZAC) 20 MG capsule Take 20 mg by mouth at bedtime. Take with 10 mg dose 06/21/18   [provider]  FREESTYLE LITE test strip  09/17/18   [provider]  gabapentin (NEURONTIN) 300 MG capsule Take 1-2 capsules (300-600 mg total) by mouth 3 (three) times daily. 300 mg at dinner and 600 mg at bedtime Patient taking differently: Take 300-600 mg by mouth See admin instructions. Take 1 capsule (300 mg) by mouth at dinner and 2 capsules  (600 mg) at bedtime 03/26/18   Pieter Partridge, DO  hydrochlorothiazide (HYDRODIURIL) 12.5 MG tablet Take 12.5 mg by mouth daily.  02/06/17   [provider]  levothyroxine (SYNTHROID, LEVOTHROID) 150 MCG tablet Take 150 mcg by mouth daily before breakfast.    [provider]  omeprazole (PRILOSEC) 40 MG capsule Take 1 capsule (40 mg total) by mouth 2 (two) times daily before a meal. 02/10/15   Barton Dubois, MD  ondansetron (ZOFRAN) 4 MG tablet Take 4 mg by mouth every 8 (eight) hours as needed for nausea/vomiting. 07/02/18   [provider]  oxybutynin (DITROPAN) 5 MG tablet Take 5 mg by mouth every evening.     [provider]  rOPINIRole (REQUIP) 3 MG tablet TAKE 1 TABLET BY MOUTH DAILY AT DINNER AND 1 TABLET AT BEDTIME.  PLEASE CALL [phone] TO SCHEDULE AN APPT. Patient taking differently: Take 3 mg by mouth at bedtime.  02/02/18   Sater, Nanine Means, MD  simvastatin (ZOCOR) 10 MG tablet Take 10 mg by mouth every evening.     [provider]  SUMAtriptan (IMITREX) 100 MG tablet Take 1 tablet (100 mg total) by mouth once as needed for migraine. May repeat in 2 hours if headache persists or recurs. 09/21/18   Pieter Partridge, DO    Family History Family History  Problem Relation Age of Onset  . Barrett's esophagus Mother   . Hypertension Mother   . Graves' disease Sister   . Rheum arthritis Sister   . Heart attack Father 45  . Coronary artery disease Neg Hx     Social History Social History   Tobacco Use  . Smoking status: Former Smoker    Quit date: 01/25/1984    Years since quitting: 34.7  . Smokeless tobacco: Never Used  Substance Use Topics  . Alcohol use: Not Currently  . Drug use: No     Allergies   Adderall [amphetamine-dextroamphetamine] and Topamax [topiramate]   Review of Systems Review of Systems  All other systems reviewed and are negative.    Physical Exam Updated Vital Signs BP (!) 142/62 (BP Location: Left Arm)   Pulse 89   Temp 99.1 F (37.3 C) (Oral)    Resp 16   Ht 5\' 6"  (1.676 m)   Wt 125.6 kg   SpO2 98%   BMI 44.71 kg/m   Physical Exam Vitals signs and nursing note reviewed.  Constitutional:      General: She is not in acute distress.    Appearance: She is well-developed.  HENT:     Head: Normocephalic  and atraumatic.  Eyes:     Conjunctiva/sclera: Conjunctivae normal.  Neck:     Musculoskeletal: Neck supple.  Cardiovascular:     Rate and Rhythm: Normal rate and regular rhythm.     Heart sounds: No murmur.  Pulmonary:     Effort: Pulmonary effort is normal. No respiratory distress.     Breath sounds: Normal breath sounds.  Abdominal:     Palpations: Abdomen is soft.     Tenderness: There is abdominal tenderness in the left lower quadrant.  Musculoskeletal: Normal range of motion.  Skin:    General: Skin is warm and dry.  Neurological:     Mental Status: She is alert and oriented to person, place, and time.  Psychiatric:        Mood and Affect: Mood normal.        Behavior: Behavior normal.      ED Treatments / Results  Labs (all labs ordered are listed, but only abnormal results are displayed) Labs Reviewed  CBC - Abnormal; Notable for the following components:      Result Value   WBC 11.1 (*)    All other components within normal limits  URINALYSIS, ROUTINE W REFLEX MICROSCOPIC - Abnormal; Notable for the following components:   Hgb urine dipstick SMALL (*)    Bacteria, UA RARE (*)    All other components within normal limits  LIPASE, BLOOD  COMPREHENSIVE METABOLIC PANEL  I-STAT BETA HCG BLOOD, ED ([facility], [facility], [facility] ONLY)    EKG None  Radiology Ct Abdomen Pelvis W Contrast  Result Date: 10/19/2018 CLINICAL DATA:  63 year old female with abdominal pain. EXAM: CT ABDOMEN AND PELVIS WITH CONTRAST TECHNIQUE: Multidetector CT imaging of the abdomen and pelvis was performed using the standard protocol following bolus administration of intravenous contrast. CONTRAST:  175mL OMNIPAQUE IOHEXOL 300 MG/ML  SOLN  COMPARISON:  CT of the abdomen pelvis dated 09/10/2015 FINDINGS: Lower chest: The visualized lung bases are clear. No intra-abdominal free air or free fluid. Hepatobiliary: There is fatty infiltration of the liver. No intrahepatic biliary ductal dilatation. Cholecystectomy. Pancreas: Unremarkable. No pancreatic ductal dilatation or surrounding inflammatory changes. Spleen: Normal in size without focal abnormality. Adrenals/Urinary Tract: Adrenal glands are unremarkable. Kidneys are normal, without renal calculi, focal lesion, or hydronephrosis. Bladder is unremarkable. Stomach/Bowel: There is extensive distal descending and sigmoid diverticulosis. There is active inflammatory changes of the sigmoid colon consistent with acute diverticulitis. No diverticular abscess or perforation. Scattered colonic diverticula. There is no bowel obstruction. The appendix is normal. Vascular/Lymphatic: The abdominal aorta and IVC are unremarkable. No portal venous gas. There is no adenopathy. Reproductive: Hysterectomy. No adnexal masses. Other: None Musculoskeletal: Degenerative changes of the lower lumbar spine with disc desiccation and vacuum phenomena. No acute osseous pathology. IMPRESSION: 1. Sigmoid diverticulitis. No diverticular abscess or perforation. 2. Fatty liver. Electronically Signed   By: Anner Crete M.D.   On: 10/19/2018 03:40    Procedures Procedures (including critical care time)  Medications Ordered in ED Medications  sodium chloride flush (NS) 0.9 % injection 3 mL (has no administration in time range)  sodium chloride 0.9 % bolus 1,000 mL (has no administration in time range)  morphine 4 MG/ML injection 4 mg (has no administration in time range)  ondansetron (ZOFRAN) injection 4 mg (has no administration in time range)     Initial Impression / Assessment and Plan / ED Course  I have reviewed the triage vital signs and the nursing notes.  Pertinent labs & imaging results that  were available  during my care of the patient were reviewed by me and considered in my medical decision making (see chart for details).        Patient with lower abdominal pain and back pain.  Symptoms have been ongoing for about a week.  She was sent to the ED by her PCP for further evaluation.  She is noted to have leukocytosis of 11.1.  She does have some left lower quadrant abdominal tenderness.  Will check CT abdomen/pelvis.  CT shows sigmoid diverticulitis.  Will treat with Cipro and Flagyl.  Will give first dose IV in the ED.  As there is no perforation or abscess, the patient is well-appearing, and her pain is well controlled, I believe the patient can be treated on an outpatient basis.  Patient understands and agrees with this plan.  She will follow-up with her doctor.  Discharge home with Cipro and Flagyl.  Will send home with a prescription for a few Vicodin.  I discussed the risks and benefits of pain medicine with patient.  Final Clinical Impressions(s) / ED Diagnoses   Final diagnoses:  Diverticulitis    ED Discharge Orders         Ordered    ciprofloxacin (CIPRO) 500 MG tablet  2 times daily     10/19/18 0444    metroNIDAZOLE (FLAGYL) 500 MG tablet  2 times daily     10/19/18 0444    HYDROcodone-acetaminophen (NORCO/VICODIN) 5-325 MG tablet  Every 6 hours PRN     10/19/18 0445           Montine Circle, PA-C [id number]    Delora Fuel, MD [id number] [phone]

## 2018-10-21 ENCOUNTER — Encounter (HOSPITAL_COMMUNITY): Payer: Self-pay | Admitting: Emergency Medicine

## 2018-10-21 ENCOUNTER — Emergency Department (HOSPITAL_COMMUNITY)
Admission: EM | Admit: 2018-10-21 | Discharge: 2018-10-21 | Disposition: A | Discharge status: Home / Self Care | Payer: PPO | Attending: Emergency Medicine | Admitting: Emergency Medicine

## 2018-10-21 ENCOUNTER — Emergency Department (HOSPITAL_COMMUNITY): Payer: PPO

## 2018-10-21 ENCOUNTER — Other Ambulatory Visit: Payer: Self-pay

## 2018-10-21 DIAGNOSIS — E039 Hypothyroidism, unspecified: Secondary | ICD-10-CM | POA: Diagnosis not present

## 2018-10-21 DIAGNOSIS — E119 Type 2 diabetes mellitus without complications: Secondary | ICD-10-CM | POA: Diagnosis not present

## 2018-10-21 DIAGNOSIS — K5792 Diverticulitis of intestine, part unspecified, without perforation or abscess without bleeding: Secondary | ICD-10-CM | POA: Diagnosis not present

## 2018-10-21 DIAGNOSIS — R1030 Lower abdominal pain, unspecified: Secondary | ICD-10-CM | POA: Insufficient documentation

## 2018-10-21 DIAGNOSIS — Z79899 Other long term (current) drug therapy: Secondary | ICD-10-CM | POA: Diagnosis not present

## 2018-10-21 DIAGNOSIS — Z7982 Long term (current) use of aspirin: Secondary | ICD-10-CM | POA: Diagnosis not present

## 2018-10-21 DIAGNOSIS — R109 Unspecified abdominal pain: Secondary | ICD-10-CM

## 2018-10-21 DIAGNOSIS — Z87891 Personal history of nicotine dependence: Secondary | ICD-10-CM | POA: Diagnosis not present

## 2018-10-21 DIAGNOSIS — I10 Essential (primary) hypertension: Secondary | ICD-10-CM | POA: Diagnosis not present

## 2018-10-21 LAB — URINALYSIS, ROUTINE W REFLEX MICROSCOPIC
Bilirubin Urine: NEGATIVE
Glucose, UA: NEGATIVE mg/dL
Hgb urine dipstick: NEGATIVE
Ketones, ur: NEGATIVE mg/dL
Nitrite: NEGATIVE
Protein, ur: NEGATIVE mg/dL
Specific Gravity, Urine: 1.016 (ref 1.005–1.030)
pH: 7 (ref 5.0–8.0)

## 2018-10-21 LAB — COMPREHENSIVE METABOLIC PANEL
ALT: 23 U/L (ref 0–44)
AST: 21 U/L (ref 15–41)
Albumin: 3.6 g/dL (ref 3.5–5.0)
Alkaline Phosphatase: 80 U/L (ref 38–126)
Anion gap: 11 (ref 5–15)
BUN: 13 mg/dL (ref 6–20)
CO2: 24 mmol/L (ref 22–32)
Calcium: 8.8 mg/dL — ABNORMAL LOW (ref 8.9–10.3)
Chloride: 104 mmol/L (ref 98–111)
Creatinine, Ser: 1.05 mg/dL — ABNORMAL HIGH (ref 0.44–1.00)
GFR calc Af Amer: >60 mL/min (ref >60)
GFR calc non Af Amer: 58 mL/min — ABNORMAL LOW (ref >60)
Glucose, Bld: 102 mg/dL — ABNORMAL HIGH (ref 70–99)
Potassium: 3.5 mmol/L (ref 3.5–5.1)
Sodium: 139 mmol/L (ref 135–145)
Total Bilirubin: 0.6 mg/dL (ref 0.3–1.2)
Total Protein: 6.8 g/dL (ref 6.5–8.1)

## 2018-10-21 LAB — CBC
HCT: 37.6 % (ref 36.0–46.0)
Hemoglobin: 12.2 g/dL (ref 12.0–15.0)
MCH: 27.5 pg (ref 26.0–34.0)
MCHC: 32.4 g/dL (ref 30.0–36.0)
MCV: 84.9 fL (ref 80.0–100.0)
Platelets: 275 10*3/uL (ref 150–400)
RBC: 4.43 MIL/uL (ref 3.87–5.11)
RDW: 13.6 % (ref 11.5–15.5)
WBC: 7.7 10*3/uL (ref 4.0–10.5)
nRBC: 0 % (ref 0.0–0.2)

## 2018-10-21 LAB — LIPASE, BLOOD: Lipase: 25 U/L (ref 11–51)

## 2018-10-21 MED ORDER — SODIUM CHLORIDE 0.9% FLUSH
3.0000 mL | Freq: Once | INTRAVENOUS | Status: AC
  Administered 2018-10-21: 3 mL via INTRAVENOUS

## 2018-10-21 MED ORDER — LACTATED RINGERS IV BOLUS
1000.0000 mL | Freq: Once | INTRAVENOUS | Status: AC
  Administered 2018-10-21: 1000 mL via INTRAVENOUS

## 2018-10-21 MED ORDER — IOHEXOL 300 MG/ML  SOLN
100.0000 mL | Freq: Once | INTRAMUSCULAR | Status: AC | PRN
  Administered 2018-10-21: 100 mL via INTRAVENOUS

## 2018-10-21 NOTE — ED Provider Notes (Signed)
[facility] EMERGENCY DEPARTMENT Provider Note   CSN: [id number] Arrival date & time: 10/21/18  0053     History   Chief Complaint Chief Complaint  Patient presents with   Abdominal Pain    HPI Anna Walker is a 63 y.o. female.     HPI   63 year old female with abdominal pain.  Patient was seen in the emergency room on 10/19/2018.  She was diagnosed with diverticulitis based on CT scan.  She was started on ciprofloxacin and metronidazole.  She states that she has been taking these as prescribed.  She continued to have lower abdominal pain.  Sensation that she needs to have a bowel movement but is not going.  Some mild nausea.  She is concerned that she may potentially have a bowel obstruction.  No fevers.  She states that she only took 1 dose of Vicodin at home but stopped because it gave her bad dreams.  Past Medical History:  Diagnosis Date   ADD (attention deficit disorder)    Biliary colic    Bladder leak    Diabetes mellitus without complication ([place])    type 2 , diet controlled    DVT (deep venous thrombosis) (HCC)    left leg , resolved after treatment of blood thinners    GERD (gastroesophageal reflux disease)    causes coughing    Hypertension    Leg cramps    "in my thighs"    Migraines    Multiple sclerosis ([place])    per patient " that ha sbeen ruled out , they though it was that initially but it did not preogress like it"   Optic neuritis    lesion on right optic nerve; right eye only , reduced vision and blurriness , loss of depth perception, glasses do not help when it flares     OSA (obstructive sleep apnea)    nightly cpap    Paresthesias/numbness    fingers and toes    Pneumonia 2017   Restless leg syndrome    Thyroid disease    Hypothyroidism    Patient Active Problem List   Diagnosis Date Noted   DOE (dyspnea on exertion)    Substernal chest pain 02/08/2015   Attention deficit disorder 12/22/2014    History of optic neuritis 06/20/2014   OSA on CPAP 06/20/2014   Restless leg syndrome 06/20/2014   Other fatigue 06/20/2014   Migraine without aura and without status migrainosus, not intractable 06/20/2014   possible MS 02/24/2014   Hypothyroidism 02/24/2014   GERD (gastroesophageal reflux disease) 02/24/2014    Past Surgical History:  Procedure Laterality Date   ABDOMINAL HYSTERECTOMY     BREAST LUMPECTOMY Left    CESAREAN SECTION     x2    CHOLECYSTECTOMY N/A 08/16/2018   Procedure: LAPAROSCOPIC CHOLECYSTECTOMY;  Surgeon: Greer Pickerel, MD;  Location: [facility];  Service: General;  Laterality: N/A;   MENISCUS REPAIR Left left   MENISCUS REPAIR Right    knee    TUBAL LIGATION     during 2nd c section      OB History   No obstetric history on file.      Home Medications    Prior to Admission medications   Medication Sig Start Date End Date Taking? Authorizing Provider  acetaminophen (TYLENOL) 500 MG tablet Take 1,500 mg by mouth 3 (three) times daily as needed (PAIN.).    [provider]  amantadine (SYMMETREL) 100 MG capsule TAKE 1 CAPSULE(100 MG)  BY MOUTH THREE TIMES DAILY Patient taking differently: Take 300 mg by mouth daily.  07/09/18   Pieter Partridge, DO  aspirin (ASPIRIN EC) 81 MG EC tablet Take 81 mg by mouth daily. Swallow whole.    [provider]  ciprofloxacin (CIPRO) 500 MG tablet Take 1 tablet (500 mg total) by mouth 2 (two) times daily. 10/19/18   Montine Circle, PA-C  FLUoxetine (PROZAC) 10 MG capsule Take 10 mg by mouth at bedtime. Take with 20 mg dose 03/19/17   [provider]  FLUoxetine (PROZAC) 20 MG capsule Take 20 mg by mouth at bedtime. Take with 10 mg dose 06/21/18   [provider]  FREESTYLE LITE test strip  09/17/18   [provider]  gabapentin (NEURONTIN) 300 MG capsule Take 1-2 capsules (300-600 mg total) by mouth 3 (three) times daily. 300 mg at dinner and 600 mg at bedtime Patient taking  differently: Take 300-600 mg by mouth See admin instructions. Take 1 capsule (300 mg) by mouth at dinner and 2 capsules  (600 mg) at bedtime 03/26/18   Pieter Partridge, DO  hydrochlorothiazide (HYDRODIURIL) 12.5 MG tablet Take 12.5 mg by mouth daily.  02/06/17   [provider]  HYDROcodone-acetaminophen (NORCO/VICODIN) 5-325 MG tablet Take 1-2 tablets by mouth every 6 (six) hours as needed. 10/19/18   Montine Circle, PA-C  levothyroxine (SYNTHROID, LEVOTHROID) 150 MCG tablet Take 150 mcg by mouth daily before breakfast.    [provider]  metroNIDAZOLE (FLAGYL) 500 MG tablet Take 1 tablet (500 mg total) by mouth 2 (two) times daily. 10/19/18   Montine Circle, PA-C  omeprazole (PRILOSEC) 40 MG capsule Take 1 capsule (40 mg total) by mouth 2 (two) times daily before a meal. 02/10/15   Barton Dubois, MD  ondansetron (ZOFRAN) 4 MG tablet Take 4 mg by mouth every 8 (eight) hours as needed for nausea/vomiting. 07/02/18   [provider]  oxybutynin (DITROPAN) 5 MG tablet Take 5 mg by mouth every evening.     [provider]  rOPINIRole (REQUIP) 3 MG tablet TAKE 1 TABLET BY MOUTH DAILY AT DINNER AND 1 TABLET AT BEDTIME.  PLEASE CALL [phone] TO SCHEDULE AN APPT. Patient taking differently: Take 3 mg by mouth at bedtime.  02/02/18   Sater, Nanine Means, MD  simvastatin (ZOCOR) 10 MG tablet Take 10 mg by mouth every evening.     [provider]  SUMAtriptan (IMITREX) 100 MG tablet Take 1 tablet (100 mg total) by mouth once as needed for migraine. May repeat in 2 hours if headache persists or recurs. 09/21/18   Pieter Partridge, DO    Family History Family History  Problem Relation Age of Onset   Barrett's esophagus Mother    Hypertension Mother    Berenice Primas' disease Sister    Rheum arthritis Sister    Heart attack Father 40   Coronary artery disease Neg Hx     Social History Social History   Tobacco Use   Smoking status: Former Smoker    Quit date: 01/25/1984      Years since quitting: 34.7   Smokeless tobacco: Never Used  Substance Use Topics   Alcohol use: Not Currently   Drug use: No     Allergies   Adderall [amphetamine-dextroamphetamine] and Topamax [topiramate]   Review of Systems Review of Systems  All systems reviewed and negative, other than as noted in HPI.  Physical Exam Updated Vital Signs BP 126/81    Pulse 75  Temp 98.8 F (37.1 C) (Oral)    Resp 17    SpO2 97%   Physical Exam Vitals signs and nursing note reviewed.  Constitutional:      General: She is not in acute distress.    Appearance: She is well-developed. She is obese.  HENT:     Head: Normocephalic and atraumatic.  Eyes:     General:        Right eye: No discharge.        Left eye: No discharge.     Conjunctiva/sclera: Conjunctivae normal.  Neck:     Musculoskeletal: Neck supple.  Cardiovascular:     Rate and Rhythm: Normal rate and regular rhythm.     Heart sounds: Normal heart sounds. No murmur. No friction rub. No gallop.   Pulmonary:     Effort: Pulmonary effort is normal. No respiratory distress.     Breath sounds: Normal breath sounds.  Abdominal:     General: There is no distension.     Palpations: Abdomen is soft.     Tenderness: There is abdominal tenderness.     Comments: Mild tenderness across the lower abdomen.  No rebound or guarding.  Musculoskeletal:        General: No tenderness.  Skin:    General: Skin is warm and dry.  Neurological:     Mental Status: She is alert.  Psychiatric:        Behavior: Behavior normal.        Thought Content: Thought content normal.      ED Treatments / Results  Labs (all labs ordered are listed, but only abnormal results are displayed) Labs Reviewed  COMPREHENSIVE METABOLIC PANEL - Abnormal; Notable for the following components:      Result Value   Glucose, Bld 102 (*)    Creatinine, Ser 1.05 (*)    Calcium 8.8 (*)    GFR calc non Af Amer 58 (*)    All other components within  normal limits  URINALYSIS, ROUTINE W REFLEX MICROSCOPIC - Abnormal; Notable for the following components:   Leukocytes,Ua TRACE (*)    Bacteria, UA RARE (*)    All other components within normal limits  LIPASE, BLOOD  CBC    EKG None  Radiology Ct Abdomen Pelvis W Contrast  Result Date: 10/21/2018 CLINICAL DATA:  Worsening abdominal pain with history of diverticulitis. EXAM: CT ABDOMEN AND PELVIS WITH CONTRAST TECHNIQUE: Multidetector CT imaging of the abdomen and pelvis was performed using the standard protocol following bolus administration of intravenous contrast. CONTRAST:  123mL OMNIPAQUE IOHEXOL 300 MG/ML  SOLN COMPARISON:  10/19/2018 FINDINGS: Lower chest: Unremarkable. Hepatobiliary: No suspicious focal abnormality within the liver parenchyma. Gallbladder is surgically absent. No intrahepatic or extrahepatic biliary dilation. Pancreas: No focal mass lesion. No dilatation of the main duct. No intraparenchymal cyst. No peripancreatic edema. Spleen: No splenomegaly. No focal mass lesion. Adrenals/Urinary Tract: No adrenal nodule or mass. Kidneys unremarkable. No evidence for hydroureter. The urinary bladder appears normal for the degree of distention. Stomach/Bowel: Stomach is unremarkable. No gastric wall thickening. No evidence of outlet obstruction. Duodenum is normally positioned as is the ligament of Treitz. No small bowel wall thickening. No small bowel dilatation. The terminal ileum is normal. The appendix is not visualized, but there is no edema or inflammation in the region of the cecum. Left colonic diverticulosis again noted with pericolonic edema/inflammation at the junction of the descending and sigmoid segments. Wall thickening and pericolonic edema/inflammation has decreased in the interval. No  evidence for perforation or abscess. Vascular/Lymphatic: No abdominal aortic aneurysm. No abdominal aortic atherosclerotic calcification. There is no gastrohepatic or hepatoduodenal ligament  lymphadenopathy. No intraperitoneal or retroperitoneal lymphadenopathy. No pelvic sidewall lymphadenopathy. Reproductive: The uterus is surgically absent. There is no adnexal mass. Other: No intraperitoneal free fluid. Musculoskeletal: No worrisome lytic or sclerotic osseous abnormality. IMPRESSION: 1. Interval decrease in wall thickening and pericolonic edema/inflammation associated with the proximal sigmoid colon. Imaging features consistent with improving diverticulitis. No perforation or abscess. Electronically Signed   By: Misty Stanley M.D.   On: 10/21/2018 09:08    Procedures Procedures (including critical care time)  Medications Ordered in ED Medications  sodium chloride flush (NS) 0.9 % injection 3 mL (has no administration in time range)  lactated ringers bolus 1,000 mL (1,000 mLs Intravenous New Bag/Given 10/21/18 0803)  iohexol (OMNIPAQUE) 300 MG/ML solution 100 mL (100 mLs Intravenous Contrast Given 10/21/18 0825)     Initial Impression / Assessment and Plan / ED Course  I have reviewed the triage vital signs and the nursing notes.  Pertinent labs & imaging results that were available during my care of the patient were reviewed by me and considered in my medical decision making (see chart for details).     63 year old female with left flank pain.  Suspect ureteral colic.  History of kidney stones.  Describes symptoms seem consistent.  She is not really tender on exam.  Blood noted on UA.  Will CT.  Symptomatic treatment.  Reassessment.8-year-old female with continued abdominal pain after recent diagnosis of diverticulitis.  She reports compliance with her ciprofloxacin and metronidazole.  Mild tenderness on exam.  I suspect that she is simply having continued symptoms of uncomplicated diverticulitis.  Her urgency use the bathroom could simply be because of colonic inflammation.  Explained this to patient.  Found it difficult to reassure her.  There is certainly the possibility of  perforation or abscess although I feel this is less likely.  She does not feel like her pain is gotten significantly better despite several days of treatment.  Will re-CT.  She is declining pain and nausea medicine at this time.   CT as expected.  Still some changes of diverticulitis, but improved.  No perforation or abscess.  No signs of other concerning pathology.  Patient reassured.  Reiterated return precautions.  Continued antibiotics until finished.  Final Clinical Impressions(s) / ED Diagnoses   Final diagnoses:  Abdominal pain, unspecified abdominal location  Diverticulitis    ED Discharge Orders    None       Virgel Manifold, MD 10/21/18 [phone]

## 2018-10-21 NOTE — ED Notes (Signed)
Patient transported to CT 

## 2018-10-21 NOTE — ED Triage Notes (Signed)
Pt c/o abd pain/distention, denies n/v, denies diarrhea. No BM since Thursday. Pt dx with diverticulitis.

## 2018-10-21 NOTE — Discharge Instructions (Addendum)
Your CT scan today still shows some changes of diverticulitis, but improved from your last CT scan. There is no perforation, abscess or other concerning finding.

## 2018-11-01 DIAGNOSIS — G4733 Obstructive sleep apnea (adult) (pediatric): Secondary | ICD-10-CM | POA: Diagnosis not present

## 2018-11-12 DIAGNOSIS — K5792 Diverticulitis of intestine, part unspecified, without perforation or abscess without bleeding: Secondary | ICD-10-CM | POA: Diagnosis not present

## 2018-11-12 DIAGNOSIS — K5900 Constipation, unspecified: Secondary | ICD-10-CM | POA: Diagnosis not present

## 2018-11-12 DIAGNOSIS — K760 Fatty (change of) liver, not elsewhere classified: Secondary | ICD-10-CM | POA: Diagnosis not present

## 2018-11-12 DIAGNOSIS — K625 Hemorrhage of anus and rectum: Secondary | ICD-10-CM | POA: Diagnosis not present

## 2018-11-23 ENCOUNTER — Other Ambulatory Visit: Payer: Self-pay | Admitting: Gastroenterology

## 2018-11-26 ENCOUNTER — Other Ambulatory Visit: Payer: Self-pay | Admitting: Gastroenterology

## 2018-11-26 DIAGNOSIS — M1712 Unilateral primary osteoarthritis, left knee: Secondary | ICD-10-CM | POA: Diagnosis not present

## 2018-11-26 DIAGNOSIS — M25561 Pain in right knee: Secondary | ICD-10-CM | POA: Diagnosis not present

## 2018-11-26 DIAGNOSIS — M25562 Pain in left knee: Secondary | ICD-10-CM | POA: Diagnosis not present

## 2018-11-26 DIAGNOSIS — G8929 Other chronic pain: Secondary | ICD-10-CM | POA: Diagnosis not present

## 2018-12-04 DIAGNOSIS — K625 Hemorrhage of anus and rectum: Secondary | ICD-10-CM | POA: Diagnosis not present

## 2018-12-06 ENCOUNTER — Ambulatory Visit (HOSPITAL_COMMUNITY)
Admission: EM | Admit: 2018-12-06 | Discharge: 2018-12-06 | Disposition: A | Discharge status: Home / Self Care | Payer: PPO | Attending: Internal Medicine | Admitting: Internal Medicine

## 2018-12-06 ENCOUNTER — Ambulatory Visit (INDEPENDENT_AMBULATORY_CARE_PROVIDER_SITE_OTHER): Payer: PPO

## 2018-12-06 ENCOUNTER — Other Ambulatory Visit: Payer: Self-pay

## 2018-12-06 ENCOUNTER — Encounter (HOSPITAL_COMMUNITY): Payer: Self-pay | Admitting: Emergency Medicine

## 2018-12-06 DIAGNOSIS — S9031XA Contusion of right foot, initial encounter: Secondary | ICD-10-CM

## 2018-12-06 DIAGNOSIS — S99921A Unspecified injury of right foot, initial encounter: Secondary | ICD-10-CM | POA: Diagnosis not present

## 2018-12-06 DIAGNOSIS — M79671 Pain in right foot: Secondary | ICD-10-CM | POA: Diagnosis not present

## 2018-12-06 HISTORY — DX: Diverticulosis of intestine, part unspecified, without perforation or abscess without bleeding: K57.90

## 2018-12-06 NOTE — ED Provider Notes (Signed)
[place]    CSN: [id number] Arrival date & time: 12/06/18  1043      History   Chief Complaint Chief Complaint  Patient presents with  . Foot Injury    right    HPI Anna Walker is a 63 y.o. female.   Patient is a 63 year old female that presents today with right foot pain.  This started after dropping a piece of lumber on her right foot yesterday.  This has been constant, waxing and waning.  The pain and swelling is to the top of her foot.  There is mild bruising.  She has been resting and icing.  She is still able to ambulate but walking mostly on her heel due to the pain.  No numbness, tingling or deformity.  ROS per HPI      Past Medical History:  Diagnosis Date  . ADD (attention deficit disorder)   . Biliary colic   . Bladder leak   . Diabetes mellitus without complication (HCC)    type 2 , diet controlled   . Diverticular disease   . DVT (deep venous thrombosis) (HCC)    left leg , resolved after treatment of blood thinners   . GERD (gastroesophageal reflux disease)    causes coughing   . Hypertension   . Leg cramps    "in my thighs"   . Migraines   . Multiple sclerosis ([place])    per patient " that ha sbeen ruled out , they though it was that initially but it did not preogress like it"  . Optic neuritis    lesion on right optic nerve; right eye only , reduced vision and blurriness , loss of depth perception, glasses do not help when it flares    . OSA (obstructive sleep apnea)    nightly cpap   . Paresthesias/numbness    fingers and toes   . Pneumonia 2017  . Restless leg syndrome   . Thyroid disease    Hypothyroidism    Patient Active Problem List   Diagnosis Date Noted  . DOE (dyspnea on exertion)   . Substernal chest pain 02/08/2015  . Attention deficit disorder 12/22/2014  . History of optic neuritis 06/20/2014  . OSA on CPAP 06/20/2014  . Restless leg syndrome 06/20/2014  . Other fatigue 06/20/2014  . Migraine without  aura and without status migrainosus, not intractable 06/20/2014  . possible MS 02/24/2014  . Hypothyroidism 02/24/2014  . GERD (gastroesophageal reflux disease) 02/24/2014    Past Surgical History:  Procedure Laterality Date  . ABDOMINAL HYSTERECTOMY    . BREAST LUMPECTOMY Left   . CESAREAN SECTION     x2   . CHOLECYSTECTOMY N/A 08/16/2018   Procedure: LAPAROSCOPIC CHOLECYSTECTOMY;  Surgeon: Greer Pickerel, MD;  Location: [facility];  Service: General;  Laterality: N/A;  . MENISCUS REPAIR Left left  . MENISCUS REPAIR Right    knee   . TUBAL LIGATION     during 2nd c section     OB History   No obstetric history on file.      Home Medications    Prior to Admission medications   Medication Sig Start Date End Date Taking? Authorizing Provider  amantadine (SYMMETREL) 100 MG capsule TAKE 1 CAPSULE(100 MG) BY MOUTH THREE TIMES DAILY Patient taking differently: Take 300 mg by mouth daily.  07/09/18  Yes Tomi Likens, Adam R, DO  aspirin (ASPIRIN EC) 81 MG EC tablet Take 81 mg by mouth daily. Swallow whole.   Yes  [provider]  FLUoxetine (PROZAC) 10 MG capsule Take 10 mg by mouth at bedtime. Take with 20 mg dose 03/19/17  Yes [provider]  FREESTYLE LITE test strip  09/17/18  Yes [provider]  gabapentin (NEURONTIN) 300 MG capsule Take 1-2 capsules (300-600 mg total) by mouth 3 (three) times daily. 300 mg at dinner and 600 mg at bedtime Patient taking differently: Take 300-600 mg by mouth See admin instructions. Take 1 capsule (300 mg) by mouth at dinner and 2 capsules  (600 mg) at bedtime 03/26/18  Yes Jaffe, Adam R, DO  hydrochlorothiazide (HYDRODIURIL) 12.5 MG tablet Take 12.5 mg by mouth daily.  02/06/17  Yes [provider]  levothyroxine (SYNTHROID, LEVOTHROID) 150 MCG tablet Take 150 mcg by mouth daily before breakfast.   Yes [provider]  omeprazole (PRILOSEC) 40 MG capsule Take 1 capsule (40 mg total) by mouth 2 (two) times daily before a  meal. 02/10/15  Yes Barton Dubois, MD  oxybutynin (DITROPAN) 5 MG tablet Take 5 mg by mouth every evening.    Yes [provider]  rOPINIRole (REQUIP) 3 MG tablet TAKE 1 TABLET BY MOUTH DAILY AT DINNER AND 1 TABLET AT BEDTIME.  PLEASE CALL [phone] TO SCHEDULE AN APPT. Patient taking differently: Take 3 mg by mouth at bedtime.  02/02/18  Yes Sater, Nanine Means, MD  simvastatin (ZOCOR) 10 MG tablet Take 10 mg by mouth every evening.    Yes [provider]  acetaminophen (TYLENOL) 500 MG tablet Take 1,500 mg by mouth 3 (three) times daily as needed (PAIN.).    [provider]  FLUoxetine (PROZAC) 20 MG capsule Take 20 mg by mouth at bedtime. Take with 10 mg dose 06/21/18   [provider]  SUMAtriptan (IMITREX) 100 MG tablet Take 1 tablet (100 mg total) by mouth once as needed for migraine. May repeat in 2 hours if headache persists or recurs. 09/21/18   Pieter Partridge, DO    Family History Family History  Problem Relation Age of Onset  . Barrett's esophagus Mother   . Hypertension Mother   . Graves' disease Sister   . Rheum arthritis Sister   . Heart attack Father 29  . Coronary artery disease Neg Hx     Social History Social History   Tobacco Use  . Smoking status: Former Smoker    Quit date: 01/25/1984    Years since quitting: 34.8  . Smokeless tobacco: Never Used  Substance Use Topics  . Alcohol use: Not Currently  . Drug use: No     Allergies   Adderall [amphetamine-dextroamphetamine] and Topamax [topiramate]   Review of Systems Review of Systems   Physical Exam Triage Vital Signs ED Triage Vitals  Enc Vitals Group     BP 12/06/18 1108 (!) 130/59     Pulse Rate 12/06/18 1108 86     Resp 12/06/18 1108 14     Temp 12/06/18 1108 98.4 F (36.9 C)     Temp Source 12/06/18 1108 Oral     SpO2 12/06/18 1108 97 %     Weight --      Height --      Head Circumference --      Peak Flow --      Pain Score 12/06/18 1106 6     Pain Loc --       Pain Edu? --      Excl. in [place]? --    No data found.  Updated  Vital Signs BP (!) 130/59 (BP Location: Right Arm)   Pulse 86   Temp 98.4 F (36.9 C) (Oral)   Resp 14   SpO2 97%   Visual Acuity Right Eye Distance:   Left Eye Distance:   Bilateral Distance:    Right Eye Near:   Left Eye Near:    Bilateral Near:     Physical Exam Vitals signs and nursing note reviewed.  Constitutional:      General: She is not in acute distress.    Appearance: Normal appearance. She is not ill-appearing, toxic-appearing or diaphoretic.  HENT:     Head: Normocephalic.     Nose: Nose normal.     Mouth/Throat:     Pharynx: Oropharynx is clear.  Eyes:     Conjunctiva/sclera: Conjunctivae normal.  Neck:     Musculoskeletal: Normal range of motion.  Pulmonary:     Effort: Pulmonary effort is normal.  Musculoskeletal: Normal range of motion.     Right foot: Normal range of motion. Tenderness and swelling present.       Feet:  Skin:    General: Skin is warm and dry.     Findings: No rash.  Neurological:     Mental Status: She is alert.  Psychiatric:        Mood and Affect: Mood normal.      UC Treatments / Results  Labs (all labs ordered are listed, but only abnormal results are displayed) Labs Reviewed - No data to display  EKG   Radiology Dg Foot Complete Right  Result Date: 12/06/2018 CLINICAL DATA:  Right foot pain since an injury yesterday when the patient dropped a shelf on her foot. Initial encounter. EXAM: RIGHT FOOT COMPLETE - 3+ VIEW COMPARISON:  None. FINDINGS: There is no acute bony or joint abnormality. Mild midfoot osteoarthritis and a small plantar calcaneal spur are noted. Soft tissues are unremarkable. IMPRESSION: No acute abnormality. Electronically Signed   By: Inge Rise M.D.   On: 12/06/2018 11:42    Procedures Procedures (including critical care time)  Medications Ordered in UC Medications - No data to display  Initial Impression / Assessment  and Plan / UC Course  I have reviewed the triage vital signs and the nursing notes.  Pertinent labs & imaging results that were available during my care of the patient were reviewed by me and considered in my medical decision making (see chart for details).     Contusion of the right foot-x-ray negative for any acute fracture We will have her rest, ice, elevate pain ibuprofen or Tylenol for pain Compression  Follow up as needed for continued or worsening symptoms  Final Clinical Impressions(s) / UC Diagnoses   Final diagnoses:  Contusion of right foot, initial encounter     Discharge Instructions     Your x-ray did not show any fractures. Rest, ice and elevate the foot You can take ibuprofen every 8 hours for pain, inflammation and swelling. Follow up as needed for continued or worsening symptoms     ED Prescriptions    None     PDMP not reviewed this encounter.   Orvan July, NP 12/06/18 1519

## 2018-12-06 NOTE — Discharge Instructions (Addendum)
Your x-ray did not show any fractures. Rest, ice and elevate the foot You can take ibuprofen every 8 hours for pain, inflammation and swelling. Follow up as needed for continued or worsening symptoms

## 2018-12-06 NOTE — ED Triage Notes (Signed)
Pt reports dropping a piece of lumber on her right foot.  Pt has pain and swelling in her two right toes and along the lateral side of her foot below the toes.

## 2018-12-10 DIAGNOSIS — G4733 Obstructive sleep apnea (adult) (pediatric): Secondary | ICD-10-CM | POA: Diagnosis not present

## 2018-12-31 ENCOUNTER — Other Ambulatory Visit (HOSPITAL_COMMUNITY)
Admission: RE | Admit: 2018-12-31 | Discharge: 2018-12-31 | Disposition: A | Discharge status: Home / Self Care | Payer: PPO | Source: Ambulatory Visit | Attending: Gastroenterology | Admitting: Gastroenterology

## 2018-12-31 DIAGNOSIS — Z01812 Encounter for preprocedural laboratory examination: Secondary | ICD-10-CM | POA: Diagnosis not present

## 2018-12-31 DIAGNOSIS — Z20828 Contact with and (suspected) exposure to other viral communicable diseases: Secondary | ICD-10-CM | POA: Insufficient documentation

## 2019-01-01 ENCOUNTER — Encounter (HOSPITAL_COMMUNITY): Payer: Self-pay | Admitting: *Deleted

## 2019-01-01 ENCOUNTER — Other Ambulatory Visit: Payer: Self-pay

## 2019-01-01 LAB — NOVEL CORONAVIRUS, NAA (HOSP ORDER, SEND-OUT TO REF LAB; TAT 18-24 HRS): SARS-CoV-2, NAA: NOT DETECTED

## 2019-01-02 DIAGNOSIS — G4733 Obstructive sleep apnea (adult) (pediatric): Secondary | ICD-10-CM | POA: Diagnosis not present

## 2019-01-03 ENCOUNTER — Ambulatory Visit (HOSPITAL_COMMUNITY): Payer: PPO | Admitting: Anesthesiology

## 2019-01-03 ENCOUNTER — Other Ambulatory Visit: Payer: Self-pay

## 2019-01-03 ENCOUNTER — Ambulatory Visit (HOSPITAL_COMMUNITY)
Admission: RE | Admit: 2019-01-03 | Discharge: 2019-01-03 | Disposition: A | Discharge status: Home / Self Care | Payer: PPO | Attending: Gastroenterology | Admitting: Gastroenterology

## 2019-01-03 ENCOUNTER — Encounter (HOSPITAL_COMMUNITY): Payer: Self-pay | Admitting: Gastroenterology

## 2019-01-03 ENCOUNTER — Encounter (HOSPITAL_COMMUNITY)
Admission: RE | Disposition: A | Discharge status: Home / Self Care | Payer: Self-pay | Source: Home / Self Care | Attending: Gastroenterology

## 2019-01-03 DIAGNOSIS — E039 Hypothyroidism, unspecified: Secondary | ICD-10-CM | POA: Diagnosis not present

## 2019-01-03 DIAGNOSIS — K625 Hemorrhage of anus and rectum: Secondary | ICD-10-CM | POA: Diagnosis not present

## 2019-01-03 DIAGNOSIS — Z79899 Other long term (current) drug therapy: Secondary | ICD-10-CM | POA: Diagnosis not present

## 2019-01-03 DIAGNOSIS — G35 Multiple sclerosis: Secondary | ICD-10-CM | POA: Diagnosis not present

## 2019-01-03 DIAGNOSIS — K219 Gastro-esophageal reflux disease without esophagitis: Secondary | ICD-10-CM | POA: Insufficient documentation

## 2019-01-03 DIAGNOSIS — G2581 Restless legs syndrome: Secondary | ICD-10-CM | POA: Diagnosis not present

## 2019-01-03 DIAGNOSIS — E119 Type 2 diabetes mellitus without complications: Secondary | ICD-10-CM | POA: Insufficient documentation

## 2019-01-03 DIAGNOSIS — Z7982 Long term (current) use of aspirin: Secondary | ICD-10-CM | POA: Insufficient documentation

## 2019-01-03 DIAGNOSIS — I10 Essential (primary) hypertension: Secondary | ICD-10-CM | POA: Insufficient documentation

## 2019-01-03 DIAGNOSIS — K5900 Constipation, unspecified: Secondary | ICD-10-CM | POA: Insufficient documentation

## 2019-01-03 DIAGNOSIS — F329 Major depressive disorder, single episode, unspecified: Secondary | ICD-10-CM | POA: Insufficient documentation

## 2019-01-03 DIAGNOSIS — K5730 Diverticulosis of large intestine without perforation or abscess without bleeding: Secondary | ICD-10-CM | POA: Insufficient documentation

## 2019-01-03 DIAGNOSIS — Z79890 Hormone replacement therapy (postmenopausal): Secondary | ICD-10-CM | POA: Diagnosis not present

## 2019-01-03 DIAGNOSIS — G4730 Sleep apnea, unspecified: Secondary | ICD-10-CM | POA: Diagnosis not present

## 2019-01-03 DIAGNOSIS — K760 Fatty (change of) liver, not elsewhere classified: Secondary | ICD-10-CM | POA: Insufficient documentation

## 2019-01-03 DIAGNOSIS — K5790 Diverticulosis of intestine, part unspecified, without perforation or abscess without bleeding: Secondary | ICD-10-CM | POA: Diagnosis not present

## 2019-01-03 HISTORY — PX: COLONOSCOPY WITH PROPOFOL: SHX5780

## 2019-01-03 LAB — GLUCOSE, CAPILLARY: Glucose-Capillary: 86 mg/dL (ref 70–99)

## 2019-01-03 SURGERY — COLONOSCOPY WITH PROPOFOL
Anesthesia: Monitor Anesthesia Care

## 2019-01-03 MED ORDER — SODIUM CHLORIDE 0.9 % IV SOLN: INTRAVENOUS | Status: DC

## 2019-01-03 MED ORDER — LACTATED RINGERS IV SOLN
INTRAVENOUS | Status: DC
  Administered 2019-01-03: 1000 mL via INTRAVENOUS

## 2019-01-03 MED ORDER — PROPOFOL 10 MG/ML IV BOLUS
INTRAVENOUS | Status: AC
  Filled 2019-01-03: qty 20

## 2019-01-03 MED ORDER — PROPOFOL 10 MG/ML IV BOLUS
INTRAVENOUS | Status: DC | PRN
  Administered 2019-01-03 (×5): 40 mg via INTRAVENOUS
  Administered 2019-01-03: 30 mg via INTRAVENOUS
  Administered 2019-01-03 (×4): 40 mg via INTRAVENOUS

## 2019-01-03 MED ORDER — LIDOCAINE 2% (20 MG/ML) 5 ML SYRINGE
INTRAMUSCULAR | Status: DC | PRN
  Administered 2019-01-03: 40 mg via INTRAVENOUS

## 2019-01-03 MED ORDER — PROPOFOL 10 MG/ML IV BOLUS
INTRAVENOUS | Status: AC
  Filled 2019-01-03: qty 40

## 2019-01-03 SURGICAL SUPPLY — 21 items

## 2019-01-03 NOTE — Anesthesia Preprocedure Evaluation (Addendum)
Anesthesia Evaluation  Patient identified by MRN, date of birth, ID band Patient awake    Reviewed: Allergy & Precautions, NPO status , Patient's Chart, lab work & pertinent test results  Airway Mallampati: II  TM Distance: >3 FB Neck ROM: Full    Dental  (+) Teeth Intact, Dental Advisory Given   Pulmonary former smoker,    breath sounds clear to auscultation       Cardiovascular hypertension,  Rhythm:Regular Rate:Normal     Neuro/Psych    GI/Hepatic   Endo/Other  diabetes  Renal/GU      Musculoskeletal   Abdominal (+) + obese,   Peds  Hematology   Anesthesia Other Findings   Reproductive/Obstetrics                             Anesthesia Physical Anesthesia Plan  ASA: II  Anesthesia Plan: MAC   Post-op Pain Management:    Induction: Intravenous  PONV Risk Score and Plan:   Airway Management Planned: Natural Airway and Simple Face Mask  Additional Equipment:   Intra-op Plan:   Post-operative Plan:   Informed Consent: I have reviewed the patients History and Physical, chart, labs and discussed the procedure including the risks, benefits and alternatives for the proposed anesthesia with the patient or authorized representative who has indicated his/her understanding and acceptance.     Dental advisory given  Plan Discussed with: CRNA and Anesthesiologist  Anesthesia Plan Comments:         Anesthesia Quick Evaluation

## 2019-01-03 NOTE — H&P (Signed)
The patient presents to the endoscopy unit for outpatient colonoscopy to follow-up on recent attack of diverticulitis and make sure there is no other significant abnormality in the colon.  No abdominal pain at this time.  She was successfully treated with antibiotics.  Physical no acute distress  Heart regular rhythm  Lungs clear  Abdomen soft and nontender  Impression: Recent diverticulitis  Plan colonoscopy

## 2019-01-03 NOTE — Discharge Instructions (Signed)

## 2019-01-03 NOTE — Anesthesia Postprocedure Evaluation (Signed)
Anesthesia Post Note  Patient: Anna Walker  Procedure(s) Performed: COLONOSCOPY WITH PROPOFOL (N/A )     Patient location during evaluation: Endoscopy Anesthesia Type: MAC Level of consciousness: awake and alert Pain management: pain level controlled Vital Signs Assessment: post-procedure vital signs reviewed and stable Respiratory status: spontaneous breathing, nonlabored ventilation, respiratory function stable and patient connected to nasal cannula oxygen Cardiovascular status: stable and blood pressure returned to baseline Postop Assessment: no apparent nausea or vomiting Anesthetic complications: no    Last Vitals:  Vitals:   01/03/19 1500 01/03/19 1509  BP: 130/74 140/76  Pulse: 77 70  Resp: 19 17  Temp:    SpO2: 96% 98%    Last Pain:  Vitals:   01/03/19 1442  TempSrc: Temporal  PainSc: 0-No pain                 Christl Fessenden COKER

## 2019-01-03 NOTE — Op Note (Signed)
[facility] Patient Name: Anna Walker Procedure Date: 01/03/2019 MRN: [id number] Attending MD: Wonda Horner , MD Date of Birth: 29-May-1955 CSN: [id number] Age: 63 Admit Type: Outpatient Procedure:                Colonoscopy Indications:              Follow-up of diverticulitis Providers:                Wonda Horner, MD, Benay Pillow, RN, Grace Isaac, RN, Lazaro Arms, Technician Referring MD:              Medicines:                Propofol per Anesthesia Complications:            No immediate complications. Estimated Blood Loss:     Estimated blood loss: none. Procedure:                Pre-Anesthesia Assessment:                           - Prior to the procedure, a History and Physical                            was performed, and patient medications and                            allergies were reviewed. The patient's tolerance of                            previous anesthesia was also reviewed. The risks                            and benefits of the procedure and the sedation                            options and risks were discussed with the patient.                            All questions were answered, and informed consent                            was obtained. Prior Anticoagulants: The patient has                            taken no previous anticoagulant or antiplatelet                            agents. ASA Grade Assessment: III - A patient with                            severe systemic disease. After reviewing the risks  and benefits, the patient was deemed in                            satisfactory condition to undergo the procedure.                           After obtaining informed consent, the colonoscope                            was passed under direct vision. Throughout the                            procedure, the patient's blood pressure, pulse, and   oxygen saturations were monitored continuously. The                            [id number] Olympus pediatric colonscope                            was introduced through the anus and advanced to the                            the cecum, identified by appendiceal orifice and                            ileocecal valve. The ileocecal valve, appendiceal                            orifice, and rectum were photographed. The                            colonoscopy was performed without difficulty. The                            patient tolerated the procedure well. The quality                            of the bowel preparation was adequate. Scope In: 2:17:37 PM Scope Out: 2:36:04 PM Scope Withdrawal Time: 0 hours 8 minutes 23 seconds  Total Procedure Duration: 0 hours 18 minutes 27 seconds  Findings:      The perianal and digital rectal examinations were normal.      Multiple diverticula were found in the sigmoid colon and descending       colon.      The exam was otherwise without abnormality. Impression:               - Diverticulosis in the sigmoid colon and in the                            descending colon.                           - The examination was otherwise normal.                           -  No specimens collected. Moderate Sedation:      . Recommendation:           - Resume regular diet.                           - Continue present medications.                           - Repeat colonoscopy in 10 years for screening                            purposes. Procedure Code(s):        --- Professional ---                           [phone], Colonoscopy, flexible; diagnostic, including                            collection of specimen(s) by brushing or washing,                            when performed (separate procedure) Diagnosis Code(s):        --- Professional ---                           [id number], Diverticulitis of large intestine without                             perforation or abscess without bleeding                           K57.30, Diverticulosis of large intestine without                            perforation or abscess without bleeding CPT copyright 2019 [employer]. All rights reserved. The codes documented in this report are preliminary and upon coder review may  be revised to meet current compliance requirements. Wonda Horner, MD 01/03/2019 2:42:37 PM This report has been signed electronically. Number of Addenda: 0

## 2019-01-03 NOTE — Anesthesia Procedure Notes (Addendum)
Procedure Name: MAC Date/Time: 01/03/2019 2:12 PM Performed by: Cynda Familia, CRNA Pre-anesthesia Checklist: Patient identified, Suction available, Patient being monitored, Timeout performed and Emergency Drugs available Patient Re-evaluated:Patient Re-evaluated prior to induction Oxygen Delivery Method: Simple face mask Placement Confirmation: positive ETCO2 and breath sounds checked- equal and bilateral Dental Injury: Teeth and Oropharynx as per pre-operative assessment

## 2019-01-03 NOTE — Transfer of Care (Signed)
Immediate Anesthesia Transfer of Care Note  Patient: ZIKERIA KEOUGH  Procedure(s) Performed: COLONOSCOPY WITH PROPOFOL (N/A )  Patient Location: PACU and Endoscopy Unit  Anesthesia Type:MAC  Level of Consciousness: awake, alert  and oriented  Airway & Oxygen Therapy: Patient Spontanous Breathing  Post-op Assessment: Report given to RN and Post -op Vital signs reviewed and stable  Post vital signs: Reviewed and stable  Last Vitals:  Vitals Value Taken Time  BP    Temp    Pulse 77 01/03/19 1442  Resp 11 01/03/19 1442  SpO2 98 % 01/03/19 1442  Vitals shown include unvalidated device data.  Last Pain:  Vitals:   01/03/19 1229  TempSrc:   PainSc: 0-No pain         Complications: No apparent anesthesia complications

## 2019-01-04 ENCOUNTER — Encounter: Payer: Self-pay | Admitting: *Deleted

## 2019-01-15 ENCOUNTER — Other Ambulatory Visit: Payer: Self-pay | Admitting: Orthopedic Surgery

## 2019-01-15 DIAGNOSIS — E70319 Ocular albinism, unspecified: Secondary | ICD-10-CM

## 2019-01-15 DIAGNOSIS — R52 Pain, unspecified: Secondary | ICD-10-CM

## 2019-01-15 DIAGNOSIS — R609 Edema, unspecified: Secondary | ICD-10-CM

## 2019-01-27 ENCOUNTER — Encounter (HOSPITAL_COMMUNITY): Payer: Self-pay | Admitting: *Deleted

## 2019-01-27 ENCOUNTER — Other Ambulatory Visit: Payer: Self-pay

## 2019-01-27 ENCOUNTER — Emergency Department (HOSPITAL_COMMUNITY): Payer: PPO

## 2019-01-27 ENCOUNTER — Emergency Department (HOSPITAL_BASED_OUTPATIENT_CLINIC_OR_DEPARTMENT_OTHER): Payer: PPO

## 2019-01-27 ENCOUNTER — Emergency Department (HOSPITAL_COMMUNITY)
Admission: EM | Admit: 2019-01-27 | Discharge: 2019-01-27 | Discharge status: Against Medical Advice | Payer: PPO | Attending: Emergency Medicine | Admitting: Emergency Medicine

## 2019-01-27 DIAGNOSIS — R079 Chest pain, unspecified: Secondary | ICD-10-CM | POA: Diagnosis not present

## 2019-01-27 DIAGNOSIS — M79609 Pain in unspecified limb: Secondary | ICD-10-CM | POA: Diagnosis not present

## 2019-01-27 DIAGNOSIS — Z5321 Procedure and treatment not carried out due to patient leaving prior to being seen by health care provider: Secondary | ICD-10-CM | POA: Insufficient documentation

## 2019-01-27 DIAGNOSIS — M25561 Pain in right knee: Secondary | ICD-10-CM | POA: Diagnosis present

## 2019-01-27 LAB — CBC
HCT: 41.6 % (ref 36.0–46.0)
Hemoglobin: 13 g/dL (ref 12.0–15.0)
MCH: 27.7 pg (ref 26.0–34.0)
MCHC: 31.3 g/dL (ref 30.0–36.0)
MCV: 88.5 fL (ref 80.0–100.0)
Platelets: 279 10*3/uL (ref 150–400)
RBC: 4.7 MIL/uL (ref 3.87–5.11)
RDW: 14.2 % (ref 11.5–15.5)
WBC: 8.1 10*3/uL (ref 4.0–10.5)
nRBC: 0 % (ref 0.0–0.2)

## 2019-01-27 LAB — BASIC METABOLIC PANEL
Anion gap: 9 (ref 5–15)
BUN: 11 mg/dL (ref 8–23)
CO2: 26 mmol/L (ref 22–32)
Calcium: 8.6 mg/dL — ABNORMAL LOW (ref 8.9–10.3)
Chloride: 107 mmol/L (ref 98–111)
Creatinine, Ser: 0.85 mg/dL (ref 0.44–1.00)
GFR calc Af Amer: >60 mL/min (ref >60)
GFR calc non Af Amer: >60 mL/min (ref >60)
Glucose, Bld: 108 mg/dL — ABNORMAL HIGH (ref 70–99)
Potassium: 4 mmol/L (ref 3.5–5.1)
Sodium: 142 mmol/L (ref 135–145)

## 2019-01-27 LAB — TROPONIN I (HIGH SENSITIVITY): Troponin I (High Sensitivity): 3 ng/L (ref <18)

## 2019-01-27 MED ORDER — SODIUM CHLORIDE 0.9% FLUSH: 3.0000 mL | Freq: Once | INTRAVENOUS | Status: DC

## 2019-01-27 NOTE — ED Notes (Signed)
Negative for DVT

## 2019-01-27 NOTE — Progress Notes (Signed)
VASCULAR LAB PRELIMINARY  PRELIMINARY  PRELIMINARY  PRELIMINARY  Right lower extremity venous duplex completed.    Preliminary report:  See CV proc for preliminary results.   Called report to [facility], Charge, RN  Mauro Kaufmann, Skylan Lara, RVT 01/27/2019, 5:05 PM

## 2019-01-27 NOTE — ED Triage Notes (Signed)
Pt is here with sudden severe pain to back of calf and knee on right side.  Then started having chest pain to mid and between shoulder blades like someone squeezing her. Pt has had blood clots in left leg before and only on BASA.  Pulse palpable in LE bilaterally

## 2019-01-27 NOTE — ED Notes (Signed)
Pt up to desk stating that she will be leaving. Her results have already crossed over into her MyChart per the pt and she will speak to her primary care about results. Pt advised that she should return for worsening symptoms.

## 2019-01-29 ENCOUNTER — Ambulatory Visit
Admission: RE | Admit: 2019-01-29 | Discharge: 2019-01-29 | Disposition: A | Discharge status: Home / Self Care | Payer: PPO | Source: Ambulatory Visit | Attending: Orthopedic Surgery | Admitting: Orthopedic Surgery

## 2019-01-29 ENCOUNTER — Other Ambulatory Visit: Payer: Self-pay

## 2019-01-29 DIAGNOSIS — M1712 Unilateral primary osteoarthritis, left knee: Secondary | ICD-10-CM | POA: Diagnosis not present

## 2019-01-29 DIAGNOSIS — R52 Pain, unspecified: Secondary | ICD-10-CM

## 2019-01-29 DIAGNOSIS — R609 Edema, unspecified: Secondary | ICD-10-CM

## 2019-01-29 DIAGNOSIS — E70319 Ocular albinism, unspecified: Secondary | ICD-10-CM

## 2019-01-31 DIAGNOSIS — G8929 Other chronic pain: Secondary | ICD-10-CM | POA: Diagnosis not present

## 2019-01-31 DIAGNOSIS — M1712 Unilateral primary osteoarthritis, left knee: Secondary | ICD-10-CM | POA: Diagnosis not present

## 2019-01-31 DIAGNOSIS — Z6841 Body Mass Index (BMI) 40.0 and over, adult: Secondary | ICD-10-CM | POA: Diagnosis not present

## 2019-01-31 DIAGNOSIS — M25561 Pain in right knee: Secondary | ICD-10-CM | POA: Diagnosis not present

## 2019-01-31 DIAGNOSIS — M25562 Pain in left knee: Secondary | ICD-10-CM | POA: Diagnosis not present

## 2019-02-08 ENCOUNTER — Ambulatory Visit (HOSPITAL_COMMUNITY)
Admission: EM | Admit: 2019-02-08 | Discharge: 2019-02-08 | Disposition: A | Discharge status: Home / Self Care | Payer: PPO | Attending: Emergency Medicine | Admitting: Emergency Medicine

## 2019-02-08 ENCOUNTER — Encounter (HOSPITAL_COMMUNITY): Payer: Self-pay

## 2019-02-08 ENCOUNTER — Other Ambulatory Visit: Payer: Self-pay

## 2019-02-08 DIAGNOSIS — G43919 Migraine, unspecified, intractable, without status migrainosus: Secondary | ICD-10-CM | POA: Diagnosis not present

## 2019-02-08 MED ORDER — METOCLOPRAMIDE HCL 5 MG/ML IJ SOLN
5.0000 mg | Freq: Once | INTRAMUSCULAR | Status: AC
  Administered 2019-02-08: 5 mg via INTRAMUSCULAR

## 2019-02-08 MED ORDER — METOCLOPRAMIDE HCL 5 MG/ML IJ SOLN
INTRAMUSCULAR | Status: AC
  Filled 2019-02-08: qty 2

## 2019-02-08 MED ORDER — KETOROLAC TROMETHAMINE 60 MG/2ML IM SOLN
INTRAMUSCULAR | Status: AC
  Filled 2019-02-08: qty 2

## 2019-02-08 MED ORDER — KETOROLAC TROMETHAMINE 60 MG/2ML IM SOLN
60.0000 mg | Freq: Once | INTRAMUSCULAR | Status: AC
  Administered 2019-02-08: 60 mg via INTRAMUSCULAR

## 2019-02-08 MED ORDER — DEXAMETHASONE SODIUM PHOSPHATE 10 MG/ML IJ SOLN
10.0000 mg | Freq: Once | INTRAMUSCULAR | Status: AC
  Administered 2019-02-08: 10 mg via INTRAMUSCULAR

## 2019-02-08 MED ORDER — DEXAMETHASONE SODIUM PHOSPHATE 10 MG/ML IJ SOLN
INTRAMUSCULAR | Status: AC
  Filled 2019-02-08: qty 1

## 2019-02-08 NOTE — Discharge Instructions (Signed)
May take Benadryl as well to promote sleep and help with headache.  Go home, rest in quiet dark room. Limit screen time.  Drink plenty of water to ensure adequate hydration.   Continue to follow up with your neurologist.  If any worsening of symptoms or change from your typical migraine symptoms, no improvement, please go to the ER.

## 2019-02-08 NOTE — ED Provider Notes (Signed)
[place]    CSN: [id number] Arrival date & time: 02/08/19  [id number]      History   Chief Complaint Chief Complaint  Patient presents with  . Migraine  . Dizziness  . Nausea    HPI Anna Walker is a 64 y.o. female.   Willette Alma presents with complaints of migraine headache symptoms which started yesterday. Frontal head as well as posterior neck pain. Pain behind right eye which she feels even to right ear. Dizziness. Light, sound and movement sensitive.  No head injury. Legs feel weak. Nausea no vomiting. No worse than other migraines she has had in the past, she has not had ear pain like this or pain to posterior neck, however. Took imitrex x2 yesterday, last at 8p last night. Took tylenol last night as well. Hasn't taken any medications for pain today. Doesn't take any form of preventative migraine medication. Has followed with neurology for this in the past, with head imaging completed in the past. Next appointment in march.    ROS per HPI, negative if not otherwise mentioned.      Past Medical History:  Diagnosis Date  . ADD (attention deficit disorder)   . Biliary colic   . Bladder leak   . Diabetes mellitus without complication (HCC)    type 2 , diet controlled   . Diverticular disease   . DVT (deep venous thrombosis) (HCC)    left leg , resolved after treatment of blood thinners   . GERD (gastroesophageal reflux disease)    causes coughing   . Hypertension   . Leg cramps    "in my thighs"   . Migraines   . Multiple sclerosis ([place])    per patient " that ha sbeen ruled out , they though it was that initially but it did not preogress like it"  . Optic neuritis    lesion on right optic nerve; right eye only , reduced vision and blurriness , loss of depth perception, glasses do not help when it flares    . OSA (obstructive sleep apnea)    nightly cpap   . Paresthesias/numbness    fingers and toes   . Pneumonia 2017  . Restless leg syndrome    . Thyroid disease    Hypothyroidism    Patient Active Problem List   Diagnosis Date Noted  . DOE (dyspnea on exertion)   . Substernal chest pain 02/08/2015  . Attention deficit disorder 12/22/2014  . History of optic neuritis 06/20/2014  . OSA on CPAP 06/20/2014  . Restless leg syndrome 06/20/2014  . Other fatigue 06/20/2014  . Migraine without aura and without status migrainosus, not intractable 06/20/2014  . possible MS 02/24/2014  . Hypothyroidism 02/24/2014  . GERD (gastroesophageal reflux disease) 02/24/2014    Past Surgical History:  Procedure Laterality Date  . ABDOMINAL HYSTERECTOMY    . BREAST LUMPECTOMY Left   . CESAREAN SECTION     x2   . CHOLECYSTECTOMY N/A 08/16/2018   Procedure: LAPAROSCOPIC CHOLECYSTECTOMY;  Surgeon: Greer Pickerel, MD;  Location: [facility];  Service: General;  Laterality: N/A;  . COLONOSCOPY WITH PROPOFOL N/A 01/03/2019   Procedure: COLONOSCOPY WITH PROPOFOL;  Surgeon: Wonda Horner, MD;  Location: [facility];  Service: Endoscopy;  Laterality: N/A;  . MENISCUS REPAIR Left left  . MENISCUS REPAIR Right    knee   . TUBAL LIGATION     during 2nd c section     OB History   No obstetric  history on file.      Home Medications    Prior to Admission medications   Medication Sig Start Date End Date Taking? Authorizing Provider  amantadine (SYMMETREL) 100 MG capsule TAKE 1 CAPSULE(100 MG) BY MOUTH THREE TIMES DAILY Patient taking differently: Take 100-200 mg by mouth See admin instructions. TAKE 2 CAPSULES (200 MG) BY MOUTH SCHEDULED EVERY MORNING & MAY TAKE 1 CAPSULE (100 MG) BY MOUTH IN THE AFTERNOON AS NEEDED FOR MULTIPLE SCLEROSIS SYMPTOMS 07/09/18   Pieter Partridge, DO  aspirin (ASPIRIN EC) 81 MG EC tablet Take 81 mg by mouth daily. Swallow whole.    [provider]  celecoxib (CELEBREX) 200 MG capsule Take 200 mg by mouth daily. 11/27/18   [provider]  FLUoxetine (PROZAC) 10 MG capsule Take 10 mg by mouth at bedtime.  Take with 20 mg dose 03/19/17   [provider]  FLUoxetine (PROZAC) 20 MG capsule Take 20 mg by mouth at bedtime. Take with 10 mg dose 06/21/18   [provider]  gabapentin (NEURONTIN) 300 MG capsule Take 1-2 capsules (300-600 mg total) by mouth 3 (three) times daily. 300 mg at dinner and 600 mg at bedtime Patient taking differently: Take 300-600 mg by mouth See admin instructions. Take 1 capsule (300 mg) by mouth at dinner and 2 capsules  (600 mg) at bedtime 03/26/18   Pieter Partridge, DO  hydrochlorothiazide (HYDRODIURIL) 12.5 MG tablet Take 12.5 mg by mouth daily.  02/06/17   [provider]  levothyroxine (SYNTHROID, LEVOTHROID) 150 MCG tablet Take 150 mcg by mouth daily before breakfast.    [provider]  omeprazole (PRILOSEC) 40 MG capsule Take 1 capsule (40 mg total) by mouth 2 (two) times daily before a meal. 02/10/15   Barton Dubois, MD  oxybutynin (DITROPAN) 5 MG tablet Take 5 mg by mouth at bedtime.     [provider]  polyethylene glycol-electrolytes (NULYTELY/GOLYTELY) 420 g solution Take 4,000 mLs by mouth once. 11/23/18   [provider]  PROCTOZONE-HC 2.5 % rectal cream Apply 1 application topically 2 (two) times daily as needed for hemorrhoids. 11/12/18   [provider]  rOPINIRole (REQUIP) 3 MG tablet TAKE 1 TABLET BY MOUTH DAILY AT DINNER AND 1 TABLET AT BEDTIME.  PLEASE CALL [phone] TO SCHEDULE AN APPT. Patient taking differently: Take 3 mg by mouth at bedtime.  02/02/18   Sater, Nanine Means, MD  simvastatin (ZOCOR) 10 MG tablet Take 10 mg by mouth every evening.     [provider]  SUMAtriptan (IMITREX) 100 MG tablet Take 1 tablet (100 mg total) by mouth once as needed for migraine. May repeat in 2 hours if headache persists or recurs. 09/21/18   Pieter Partridge, DO    Family History Family History  Problem Relation Age of Onset  . Barrett's esophagus Mother   . Hypertension Mother   . Graves' disease Sister    . Rheum arthritis Sister   . Heart attack Father 61  . Coronary artery disease Neg Hx     Social History Social History   Tobacco Use  . Smoking status: Former Smoker    Quit date: 01/25/1984    Years since quitting: 35.0  . Smokeless tobacco: Never Used  Substance Use Topics  . Alcohol use: Not Currently  . Drug use: No     Allergies   Adderall [amphetamine-dextroamphetamine] and Topamax [topiramate]   Review of Systems Review of Systems   Physical Exam Triage Vital Signs  ED Triage Vitals  Enc Vitals Group     BP 02/08/19 1008 131/73     Pulse Rate 02/08/19 1008 85     Resp 02/08/19 1008 16     Temp 02/08/19 1008 98.4 F (36.9 C)     Temp Source 02/08/19 1008 Oral     SpO2 02/08/19 1008 95 %     Weight --      Height --      Head Circumference --      Peak Flow --      Pain Score 02/08/19 1010 10     Pain Loc --      Pain Edu? --      Excl. in [place]? --    No data found.  Updated Vital Signs BP 131/73 (BP Location: Right Arm)   Pulse 85   Temp 98.4 F (36.9 C) (Oral)   Resp 16   SpO2 95%   Visual Acuity Right Eye Distance:   Left Eye Distance:   Bilateral Distance:    Right Eye Near:   Left Eye Near:    Bilateral Near:     Physical Exam Constitutional:      General: She is in acute distress.     Appearance: She is well-developed.     Comments: Sitting in dark room in wheelchair with sunglasses on   HENT:     Right Ear: Tympanic membrane and ear canal normal.  Eyes:     Extraocular Movements: Extraocular movements intact.     Conjunctiva/sclera: Conjunctivae normal.     Pupils: Pupils are equal, round, and reactive to light.  Cardiovascular:     Rate and Rhythm: Normal rate.  Pulmonary:     Effort: Pulmonary effort is normal.  Skin:    General: Skin is warm and dry.  Neurological:     General: No focal deficit present.     Mental Status: She is alert and oriented to person, place, and time. Mental status is at baseline.     Cranial  Nerves: No cranial nerve deficit.     Sensory: No sensory deficit.     Motor: No weakness.      UC Treatments / Results  Labs (all labs ordered are listed, but only abnormal results are displayed) Labs Reviewed - No data to display  EKG   Radiology No results found.  Procedures Procedures (including critical care time)  Medications Ordered in UC Medications  ketorolac (TORADOL) injection 60 mg (60 mg Intramuscular Given 02/08/19 1047)  metoCLOPramide (REGLAN) injection 5 mg (5 mg Intramuscular Given 02/08/19 1047)  dexamethasone (DECADRON) injection 10 mg (10 mg Intramuscular Given 02/08/19 1047)    Initial Impression / Assessment and Plan / UC Course  I have reviewed the triage vital signs and the nursing notes.  Pertinent labs & imaging results that were available during my care of the patient were reviewed by me and considered in my medical decision making (see chart for details).     No red flag findings on physical exam today, patient endorses she overall feels similar to previous documented migraine headaches, although a few variations. Migraine cocktail provided with strict return precautions. Patient verbalized understanding and agreeable to plan.   Final Clinical Impressions(s) / UC Diagnoses   Final diagnoses:  Intractable migraine without status migrainosus, unspecified migraine type     Discharge Instructions     May take Benadryl as well to promote sleep and help with headache.  Go home, rest in quiet dark  room. Limit screen time.  Drink plenty of water to ensure adequate hydration.   Continue to follow up with your neurologist.  If any worsening of symptoms or change from your typical migraine symptoms, no improvement, please go to the ER.    ED Prescriptions    None     PDMP not reviewed this encounter.   Zigmund Gottron, NP 02/08/19 1104

## 2019-02-08 NOTE — ED Triage Notes (Signed)
Pt C/o headache, nausea and dizziness. Symptoms started yesterday. Pt states that she has taking medication that is prescribed for her headache but nothing is working.

## 2019-02-10 ENCOUNTER — Other Ambulatory Visit: Payer: Self-pay | Admitting: Neurology

## 2019-02-18 ENCOUNTER — Other Ambulatory Visit: Payer: Self-pay | Admitting: Neurology

## 2019-02-19 DIAGNOSIS — L03112 Cellulitis of left axilla: Secondary | ICD-10-CM | POA: Diagnosis not present

## 2019-03-22 DIAGNOSIS — G4733 Obstructive sleep apnea (adult) (pediatric): Secondary | ICD-10-CM | POA: Diagnosis not present

## 2019-03-25 NOTE — Progress Notes (Signed)
Virtual Visit via Video Note The purpose of this virtual visit is to provide medical care while limiting exposure to the novel coronavirus.    Consent was obtained for video visit:  Yes.   Answered questions that patient had about telehealth interaction:  Yes.   I discussed the limitations, risks, security and privacy concerns of performing an evaluation and management service by telemedicine. I also discussed with the patient that there may be a patient responsible charge related to this service. The patient expressed understanding and agreed to proceed.  Pt location: Home Physician Location: office Name of referring provider:  Kelton Pillar, MD I connected with Anna Walker at patients initiation/request on 03/26/2019 at 10:50 AM EST by video enabled telemedicine application and verified that I am speaking with the correct person using two identifiers. Pt MRN:  [id number] Pt DOB:  March 15, 1955 Video Participants:  Anna Walker   History of Present Illness:  Anna Walker is a 64 year old woman with ADD, fatigue, hypertension, restless leg syndrome, migraines, hyperlipidemia, type 2 diabetes mellitus, OSA, hypothyroidism and history of right optic neuritis who follows up for migraines.  UPDATE: She had one of her right retro-orbital headaches on 02/07/2019.  However, this headache was different as the pain radiated into her right ear and posterior neck.  She went to Urgent Care the next day and was given Benadryl.  Sometimes she may have mild right eye pain once a week lasting a few hours to a day.  Yesterday, she was in the kitchen and suddenly felt like she was going to fall.  No lightheadedness, dizziness or spinning.  She felt shaky and sat into a chair.  Blood sugar was 119.  It lasted a few minutes.  Sometimes she wakes up in bed at night or in the morning and her hands are numb (entire hand).  She will shake them out and massage them and they improve.  She may notice it  if she is holding onto a phone for prolonged period of time.    Restless leg is controlled.  Takes gabapentin 600mg  at bedtime and ropinirole 3mg  at dinner and at bedtime.  If needed, she takes 300mg  gabapentin earlier in the evening.  Current NSAIDS: ASA 81mg  daily Current analgesics:no Current triptans: sumatriptan 100mg  Current anti-emetic:no Current muscle relaxants:no Current anti-anxiolytic:no Current sleep aide:no Current Antihypertensive medications: HCTZ Current Antidepressant medications: fluoxetine 30mg  Current Anticonvulsant medications: gabapentin 300mg  evening PRN and 600mg  at bedtime Other medications: Ropinirole 3mg  dinner and 3mg  bedtime, amantadine 100mg  three times daily(fatigue)      HISTORY: I RIGHT OPTIC NEURITISAND OTHER RECURRENT SYMPTOMS WITH QUESTIONABLE MULTIPLE SCLEROSIS: She had an episode of right optic neuritis in 1998, presenting first as blurred vision and then complete vision loss.A couple of weeks later, she developed right sided numbness of the face, arm and leg.She underwent a lumbar puncture which demonstrated one oligoclonal band. MRI of brain reportedly may have shown foci which may be concerning for MS. MRI cervical spine reportedly did not demonstrate demyelinating lesions. She was diagnosed with multiple sclerosis and treatedwith IV steroids. The vision loss lasted 7-8 weeks. The numbness lasted several days. She was treated with DMT for several years. She started on Betaseron but was then switched to Copaxone and then Avonex due to intolerability. She had a repeat LP in 2002 which showed no oligoclonal bands and normal IgG index of 0.6. Visual evoked potential in 2013 showed absent right response and normal left response. Repeat MRI from July 2014 was  thought to be essentially normal so Avonex was discontinued. Repeat MRI in November 2014 was unchanged. Therefore, it was believed that she did not have MS. NMO-IgG  antibodies from 02/24/14 were negative.MRI of cervical spine without contrast from 03/24/16 was personally reviewed and revealed cervical spondylosis but no abnormal cord signal.  Since the episode in 1998, she endorses subtle weakness on the right side of her body. For example, when she starts to write, her handwriting will start to get worse. She also reports short-term memory deficits since 1998. Over the years, she has had recurrent episodes of right eye pain with right sided numbness. In the past, they were thought to be MS flares which were treated with steroids. They occur when she is heated or fatigued. They typically occur at least once a year and they last about 2 days. She last had an episode in early January 2019, for which she was evaluated in the ED on 01/30/17. She had an MRI of the brain and orbits with and without contrast, which was personally reviewed and demonstrated asymmetrically small right optic nerve compatible with sequelae of prior optic neuritis but no enhancement, as well as three nonspecific tiny hyperintense T2/FLAIR foci in the bifrontal periventricular white matter.All of her symptoms have not progressed over the years and recurrence of these episodes have not increased since discontinuation of Avonex.  She was seen in the ED on 08/27/18 with 1 day of bilateral blurred vision, of gradual onset. It was monocular in either eye.  She first noticed it because she had trouble driving.  She had some right eye ache and dizziness but no headache or new numbness and tingling   MRI of brain/orbits and cervical spine with and without contrast appeared stable and showed no active demyelinating lesions or abnormal signal of optic nerves.  She was diagnosed by in-house neurology with migraine and was treated with migraine cocktail of toradol and Reglan.  She doesn't have vertigo but she feels "insecure" and problems with balance when ambulating.  Blurred vision was unchanged.  She  followed up with her ophthalmologist, Dr. Gershon Crane.  Eye exam was unremarkable except for early cataracts.   She has pain in the legs and gait issues. She does have arthritis in the knees, which required surgery and had a subsequent DVT. She is treated for restless leg syndrome, for which she takes ropinirole and gabapentin.  She has chronic burning in the hands.  She also reports chronic fatigue. She has OSA and is on CPAP. She takes amantadine.  II MIGRAINES She has history of migraines since early adulthood. They are severe bifrontal pain associated with nausea, photophobia and phonophobia but no vomiting, new visual disturbance or unilateral numbness or weakness. They last 1 to 2 days and usually occur once a year.There are no specific triggers or relieving factors however eye gets more blurred when fatigued or with drop in blood sugar.  She also reports another migraine described assevere sharpright retro-orbitalpain in September 2019. Constant but fluctuates. There is associated blurred vision in right eye, dizziness,nausea, photophobia and phonophobia, eye twitching and fatigue. No associated right sided numbness and weakness.    Past medication: topiramate  Past Medical History: Past Medical History:  Diagnosis Date  . ADD (attention deficit disorder)   . Biliary colic   . Bladder leak   . Diabetes mellitus without complication (HCC)    type 2 , diet controlled   . Diverticular disease   . DVT (deep venous thrombosis) ([place])  left leg , resolved after treatment of blood thinners   . GERD (gastroesophageal reflux disease)    causes coughing   . Hypertension   . Leg cramps    "in my thighs"   . Migraines   . Multiple sclerosis ([place])    per patient " that ha sbeen ruled out , they though it was that initially but it did not preogress like it"  . Optic neuritis    lesion on right optic nerve; right eye only , reduced vision and blurriness , loss of depth  perception, glasses do not help when it flares    . OSA (obstructive sleep apnea)    nightly cpap   . Paresthesias/numbness    fingers and toes   . Pneumonia 2017  . Restless leg syndrome   . Thyroid disease    Hypothyroidism    Medications: Outpatient Encounter Medications as of 03/26/2019  Medication Sig Note  . amantadine (SYMMETREL) 100 MG capsule TAKE 1 CAPSULE(100 MG) BY MOUTH THREE TIMES DAILY (Patient taking differently: Take 100-200 mg by mouth See admin instructions. TAKE 2 CAPSULES (200 MG) BY MOUTH SCHEDULED EVERY MORNING & MAY TAKE 1 CAPSULE (100 MG) BY MOUTH IN THE AFTERNOON AS NEEDED FOR MULTIPLE SCLEROSIS SYMPTOMS)   . aspirin (ASPIRIN EC) 81 MG EC tablet Take 81 mg by mouth daily. Swallow whole.   . celecoxib (CELEBREX) 200 MG capsule Take 200 mg by mouth daily.   Marland Kitchen FLUoxetine (PROZAC) 10 MG capsule Take 10 mg by mouth at bedtime. Take with 20 mg dose 08/08/2018: Total dose=30 mg  . FLUoxetine (PROZAC) 20 MG capsule Take 20 mg by mouth at bedtime. Take with 10 mg dose 08/08/2018: Daily dose=30 mg  . gabapentin (NEURONTIN) 300 MG capsule Take 1-2 capsules (300-600 mg total) by mouth 3 (three) times daily. 300 mg at dinner and 600 mg at bedtime (Patient taking differently: Take 300-600 mg by mouth See admin instructions. Take 1 capsule (300 mg) by mouth at dinner and 2 capsules  (600 mg) at bedtime)   . hydrochlorothiazide (HYDRODIURIL) 12.5 MG tablet Take 12.5 mg by mouth daily.    Marland Kitchen levothyroxine (SYNTHROID, LEVOTHROID) 150 MCG tablet Take 150 mcg by mouth daily before breakfast.   . omeprazole (PRILOSEC) 40 MG capsule Take 1 capsule (40 mg total) by mouth 2 (two) times daily before a meal.   . oxybutynin (DITROPAN) 5 MG tablet Take 5 mg by mouth at bedtime.    . polyethylene glycol-electrolytes (NULYTELY/GOLYTELY) 420 g solution Take 4,000 mLs by mouth once. 12/27/2018: BEFORE COLONOSCOPY   . PROCTOZONE-HC 2.5 % rectal cream Apply 1 application topically 2 (two) times daily as  needed for hemorrhoids.   Marland Kitchen rOPINIRole (REQUIP) 3 MG tablet TAKE 1 TABLET BY MOUTH DAILY AT DINNER AND 1 TABLET AT BEDTIME. PLEASE CALL [phone] TO SCHEDULE AN APPOINTMENT   . simvastatin (ZOCOR) 10 MG tablet Take 10 mg by mouth every evening.    . SUMAtriptan (IMITREX) 100 MG tablet Take 1 tablet (100 mg total) by mouth once as needed for migraine. May repeat in 2 hours if headache persists or recurs.    No facility-administered encounter medications on file as of 03/26/2019.    Allergies: Allergies  Allergen Reactions  . Adderall [Amphetamine-Dextroamphetamine] Other (See Comments)    Caused elevated BP  . Topamax [Topiramate]     dizzy    Family History: Family History  Problem Relation Age of Onset  . Barrett's esophagus Mother   . Hypertension Mother   .  Graves' disease Sister   . Rheum arthritis Sister   . Heart attack Father 40  . Coronary artery disease Neg Hx     Social History: Social History   Socioeconomic History  . Marital status: Married    Spouse name: Eddie Dibbles  . Number of children: 2  . Years of education: Not on file  . Highest education level: Some college, no degree  Occupational History  . Occupation: disabled  Tobacco Use  . Smoking status: Former Smoker    Quit date: 01/25/1984    Years since quitting: 35.1  . Smokeless tobacco: Never Used  Substance and Sexual Activity  . Alcohol use: Not Currently  . Drug use: No  . Sexual activity: Yes    Birth control/protection: Surgical  Other Topics Concern  . Not on file  Social History Narrative   Pt is right handed. She is married, lives with her husband in a 1 story house, ramp to enter. She drinks 2 cups of coffee a day.    Social Determinants of Health   Financial Resource Strain:   . Difficulty of Paying Living Expenses: Not on file  Food Insecurity:   . Worried About Charity fundraiser in the Last Year: Not on file  . Ran Out of Food in the Last Year: Not on file  Transportation Needs:   .  Lack of Transportation (Medical): Not on file  . Lack of Transportation (Non-Medical): Not on file  Physical Activity:   . Days of Exercise per Week: Not on file  . Minutes of Exercise per Session: Not on file  Stress:   . Feeling of Stress : Not on file  Social Connections:   . Frequency of Communication with Friends and Family: Not on file  . Frequency of Social Gatherings with Friends and Family: Not on file  . Attends Religious Services: Not on file  . Active Member of Clubs or Organizations: Not on file  . Attends Archivist Meetings: Not on file  . Marital Status: Not on file  Intimate Partner Violence:   . Fear of Current or Ex-Partner: Not on file  . Emotionally Abused: Not on file  . Physically Abused: Not on file  . Sexually Abused: Not on file    Observations/Objective:   There were no vitals taken for this visit. No acute distress.  Alert and oriented.  Speech fluent and not dysarthric.  Language intact.  Eyes orthophoric on primary gaze.  Face symmetric.  Assessment and Plan:   1.  Migraine with aura, without status migrainosus, not intractable.   2.  Recurrent symptoms with right-sided weakness and numbness, likely complicated migraine. Based on testing that has been performed, I do not believe Ms. Monterosa has multiple sclerosis. CSF analysis revealed 1 oligoclonal band, which is not a significant number. She has had repeated MRIs over the years with minimal (3 ) tiny hyperintense foci in the periventricular white matter which have been stable. Given her longstanding history with recurrent flare-ups, I would expect radiographic evidence of disease progression on MRI. She also does not exhibit abnormal findings in other regions of the CNS, such as supratentorial region or spinal cord. 3.  Restless leg syndrome, stable 4.  Probable bilateral carpal tunnel syndrome  1.  For migraine preventative management, gabapentin 600mg  at bedtime 2.  For migraine  abortive therapy, sumatriptan 100mg  3.  For restless leg, ropinirole 3mg  at dinner and 3mg  at bedtime; gabapentin 600mg  at bedtime (300mg  in evening if  needed) 4. Advised to wear wrist splints at bedtime.  If ineffective or if symptoms worsen, NCV-EMG of upper extremities. 5.  Limit use of pain relievers to no more than 2 days out of week to prevent risk of rebound or medication-overuse headache. 6.  Keep headache diary 7.  Exercise, hydration, caffeine cessation, sleep hygiene, monitor for and avoid triggers 8. Follow up 6 months.   Follow Up Instructions:    -I discussed the assessment and treatment plan with the patient. The patient was provided an opportunity to ask questions and all were answered. The patient agreed with the plan and demonstrated an understanding of the instructions.   The patient was advised to call back or seek an in-person evaluation if the symptoms worsen or if the condition fails to improve as anticipated.    Dudley Major, DO

## 2019-03-26 ENCOUNTER — Telehealth (INDEPENDENT_AMBULATORY_CARE_PROVIDER_SITE_OTHER): Payer: PPO | Admitting: Neurology

## 2019-03-26 ENCOUNTER — Other Ambulatory Visit: Payer: Self-pay

## 2019-03-26 ENCOUNTER — Encounter: Payer: Self-pay | Admitting: Neurology

## 2019-03-26 DIAGNOSIS — G43009 Migraine without aura, not intractable, without status migrainosus: Secondary | ICD-10-CM | POA: Diagnosis not present

## 2019-03-26 DIAGNOSIS — G5603 Carpal tunnel syndrome, bilateral upper limbs: Secondary | ICD-10-CM | POA: Diagnosis not present

## 2019-03-26 DIAGNOSIS — G2581 Restless legs syndrome: Secondary | ICD-10-CM | POA: Diagnosis not present

## 2019-04-04 DIAGNOSIS — E785 Hyperlipidemia, unspecified: Secondary | ICD-10-CM | POA: Diagnosis not present

## 2019-04-04 DIAGNOSIS — E1169 Type 2 diabetes mellitus with other specified complication: Secondary | ICD-10-CM | POA: Diagnosis not present

## 2019-04-04 DIAGNOSIS — I10 Essential (primary) hypertension: Secondary | ICD-10-CM | POA: Diagnosis not present

## 2019-04-04 DIAGNOSIS — M2390 Unspecified internal derangement of unspecified knee: Secondary | ICD-10-CM | POA: Diagnosis not present

## 2019-04-04 DIAGNOSIS — E039 Hypothyroidism, unspecified: Secondary | ICD-10-CM | POA: Diagnosis not present

## 2019-04-19 DIAGNOSIS — G4733 Obstructive sleep apnea (adult) (pediatric): Secondary | ICD-10-CM | POA: Diagnosis not present

## 2019-04-29 ENCOUNTER — Other Ambulatory Visit: Payer: Self-pay | Admitting: Neurology

## 2019-04-30 ENCOUNTER — Encounter (INDEPENDENT_AMBULATORY_CARE_PROVIDER_SITE_OTHER): Payer: Self-pay | Admitting: Bariatrics

## 2019-05-01 ENCOUNTER — Other Ambulatory Visit: Payer: Self-pay

## 2019-05-01 ENCOUNTER — Ambulatory Visit (INDEPENDENT_AMBULATORY_CARE_PROVIDER_SITE_OTHER): Payer: PPO | Admitting: Bariatrics

## 2019-05-01 ENCOUNTER — Encounter (INDEPENDENT_AMBULATORY_CARE_PROVIDER_SITE_OTHER): Payer: Self-pay | Admitting: Bariatrics

## 2019-05-01 VITALS — BP 132/79 | HR 77 | Temp 98.4°F | Ht 65.0 in | Wt 277.0 lb

## 2019-05-01 DIAGNOSIS — E119 Type 2 diabetes mellitus without complications: Secondary | ICD-10-CM

## 2019-05-01 DIAGNOSIS — K219 Gastro-esophageal reflux disease without esophagitis: Secondary | ICD-10-CM | POA: Diagnosis not present

## 2019-05-01 DIAGNOSIS — Z0289 Encounter for other administrative examinations: Secondary | ICD-10-CM

## 2019-05-01 DIAGNOSIS — I10 Essential (primary) hypertension: Secondary | ICD-10-CM | POA: Diagnosis not present

## 2019-05-01 DIAGNOSIS — E7849 Other hyperlipidemia: Secondary | ICD-10-CM

## 2019-05-01 DIAGNOSIS — G4733 Obstructive sleep apnea (adult) (pediatric): Secondary | ICD-10-CM | POA: Diagnosis not present

## 2019-05-01 DIAGNOSIS — Z1331 Encounter for screening for depression: Secondary | ICD-10-CM

## 2019-05-01 DIAGNOSIS — E559 Vitamin D deficiency, unspecified: Secondary | ICD-10-CM | POA: Diagnosis not present

## 2019-05-01 DIAGNOSIS — Z6841 Body Mass Index (BMI) 40.0 and over, adult: Secondary | ICD-10-CM | POA: Diagnosis not present

## 2019-05-01 DIAGNOSIS — E038 Other specified hypothyroidism: Secondary | ICD-10-CM

## 2019-05-01 DIAGNOSIS — R0602 Shortness of breath: Secondary | ICD-10-CM | POA: Diagnosis not present

## 2019-05-01 DIAGNOSIS — R5383 Other fatigue: Secondary | ICD-10-CM | POA: Diagnosis not present

## 2019-05-01 NOTE — Progress Notes (Signed)
Chief Complaint:   OBESITY Anna Walker (MR# [id number]) is a 64 y.o. female who presents for evaluation and treatment of obesity and related comorbidities. Current BMI is Body mass index is 46.1 kg/m.Marland Kitchen Anna Walker has been struggling with her weight for many years and has been unsuccessful in either losing weight, maintaining weight loss, or reaching her healthy weight goal.  Anna Walker is currently in the action stage of change and ready to dedicate time achieving and maintaining a healthier weight. Anna Walker is interested in becoming our patient and working on intensive lifestyle modifications including (but not limited to) diet and exercise for weight loss.  Anna Walker plans to have bilateral knee replacement surgery and states that her weight needs to be 240 lbs or less for surgery. She snacks at night after dinner.  Anna Walker's habits were reviewed today and are as follows: Her family eats meals together, she thinks her family will eat healthier with her, her desired weight loss is 37 lbs, she has been heavy since 1998, she started gaining weight after illness in 1998, her heaviest weight ever was 290 pounds, she craves chocolate, she snacks frequently in the evenings and she struggles with emotional eating.  Depression Screen Anna Walker's Food and Mood (modified PHQ-9) score was 4.  Depression screen Anna Walker 2/9 05/01/2019  Decreased Interest 0  Down, Depressed, Hopeless 1  PHQ - 2 Score 1  Altered sleeping 0  Tired, decreased energy 0  Change in appetite 1  Feeling bad or failure about yourself  1  Trouble concentrating 0  Moving slowly or fidgety/restless 0  Suicidal thoughts 1  PHQ-9 Score 4  Difficult doing work/chores Somewhat difficult   Subjective:   Other fatigue. Anna Walker denies daytime somnolence and denies waking up still tired. Anna Walker generally gets 6-8 hours of sleep per night, and states that she has a hard time falling asleep. Snoring is present. Apneic episodes  are not present. Epworth Sleepiness Score is 7.  Shortness of breath on exertion. Anna Walker notes increasing shortness of breath with certain activities and seems to be worsening over time with weight gain. She notes getting out of breath sooner with activity than she used to. This has gotten worse recently. Anna Walker denies shortness of breath at rest or orthopnea.  Other specified hypothyroidism. Anna Walker is taking levothyroxine.  Lab Results  Component Value Date   TSH 1.507 02/09/2015   Gastroesophageal reflux disease, unspecified whether esophagitis present. Anna Walker is taking omeprazole.  Type 2 diabetes mellitus without complication, without long-term current use of insulin (Anna Walker). Anna Walker is on no medications. She states she has had MAB.  Lab Results  Component Value Date   HGBA1C 6.0 (H) 08/14/2018   HGBA1C 5.9 (H) 02/09/2015   Lab Results  Component Value Date   LDLCALC 72 02/09/2015   CREATININE 0.85 01/27/2019   No results found for: INSULIN  Other hyperlipidemia. Anna Walker is taking simvastatin.   Lab Results  Component Value Date   CHOL 157 02/09/2015   HDL 34 (L) 02/09/2015   LDLCALC 72 02/09/2015   TRIG 256 (H) 02/09/2015   CHOLHDL 4.6 02/09/2015   Lab Results  Component Value Date   ALT 23 10/21/2018   AST 21 10/21/2018   ALKPHOS 80 10/21/2018   BILITOT 0.6 10/21/2018   The ASCVD Risk score Anna Bussing DC Jr., Anna al., Anna Walker) failed to calculate for the following reasons:   Cannot find a previous HDL lab   Cannot find a previous total cholesterol lab  Essential hypertension. Anna Walker  is taking HCTZ.  BP Readings from Last 3 Encounters:  05/01/19 132/79  02/08/19 131/73  01/27/19 139/89   Lab Results  Component Value Date   CREATININE 0.85 01/27/2019   CREATININE 1.05 (H) 10/21/2018   CREATININE 0.94 10/18/2018   Obstructive sleep apnea syndrome. Anna Walker has a hard time falling asleep.  Vitamin D deficiency. Anna Walker is not taking Vitamin D  supplementation.  Assessment/Plan:   Other fatigue. Anna Walker does not feel that her weight is causing her energy to be lower than it should be. Fatigue may be related to obesity, depression or many other causes. Labs will be ordered, and in the meanwhile, Anna Walker will focus on self care including making healthy food choices, increasing physical activity and focusing on stress reduction. EKG 12-Lead done today.  Shortness of breath on exertion. Anna Walker does feel that she gets out of breath more easily that she used to when she exercises. Anna Walker's shortness of breath appears to be obesity related and exercise induced. She has agreed to work on weight loss and gradually increase exercise to treat her exercise induced shortness of breath. Will continue to monitor closely.  Other specified hypothyroidism. Patient with long-standing hypothyroidism, on levothyroxine therapy. She appears euthyroid. Orders and follow up as documented in patient record. Anna Walker will continue her medication as directed.  Counseling . Good thyroid control is important for overall health. Supratherapeutic thyroid levels are dangerous and will not improve weight loss results. . The correct way to take levothyroxine is fasting, with water, separated by at least 30 minutes from breakfast, and separated by more than 4 hours from calcium, iron, multivitamins, acid reflux medications (PPIs).   Gastroesophageal reflux disease, unspecified whether esophagitis present. Intensive lifestyle modifications are the first line treatment for this issue. We discussed several lifestyle modifications today and she will continue to work on diet, exercise and weight loss efforts. Orders and follow up as documented in patient record. Anna Walker will continue her medication as directed.  Counseling . If a person has gastroesophageal reflux disease (GERD), food and stomach acid move back up into the esophagus and cause symptoms or problems  such as damage to the esophagus. . Anti-reflux measures include: raising the head of the bed, avoiding tight clothing or belts, avoiding eating late at night, not lying down shortly after mealtime, and achieving weight loss. . Avoid ASA, NSAID's, caffeine, alcohol, and tobacco.  . OTC Pepcid and/or Tums are often very helpful for as needed use.  Marland Kitchen However, for persisting chronic or daily symptoms, stronger medications like Omeprazole may be needed. . You may need to avoid foods and drinks such as: ? Coffee and tea (with or without caffeine). ? Drinks that contain alcohol. ? Energy drinks and sports drinks. ? Bubbly (carbonated) drinks or sodas. ? Chocolate and cocoa. ? Peppermint and mint flavorings. ? Garlic and onions. ? Horseradish. ? Spicy and acidic foods. These include peppers, chili powder, curry powder, vinegar, hot sauces, and BBQ sauce. ? Citrus fruit juices and citrus fruits, such as oranges, lemons, and limes. ? Tomato-based foods. These include red sauce, chili, salsa, and pizza with red sauce. ? Fried and fatty foods. These include donuts, french fries, potato chips, and high-fat dressings. ? High-fat meats. These include hot dogs, rib eye steak, sausage, ham, and bacon.  Type 2 diabetes mellitus without complication, without long-term current use of insulin ([place]). Good blood sugar control is important to decrease the likelihood of diabetic complications such as nephropathy, neuropathy, limb loss, blindness, coronary artery disease,  and death. Intensive lifestyle modification including diet, exercise and weight loss are the first line of treatment for diabetes. Anna Walker will decrease carbohydrates, increase healthy fats and protein, and increase activities. C-peptide, Insulin, random labs ordered today.  Other hyperlipidemia. Cardiovascular risk and specific lipid/LDL goals reviewed.  We discussed several lifestyle modifications today and Anna Walker will continue to work on diet,  exercise and weight loss efforts. Orders and follow up as documented in patient record. She will continue her medication as directed.  Counseling Intensive lifestyle modifications are the first line treatment for this issue. . Dietary changes: Increase soluble fiber. Decrease simple carbohydrates. . Exercise changes: Moderate to vigorous-intensity aerobic activity 150 minutes per week if tolerated. . Lipid-lowering medications: see documented in medical record.  Essential hypertension. Anna Walker is working on healthy weight loss and exercise to improve blood pressure control. We will watch for signs of hypotension as she continues her lifestyle modifications. She will continue her medication as directed.  Obstructive sleep apnea syndrome. Intensive lifestyle modifications are the first line treatment for this issue. We discussed several lifestyle modifications today and she will continue to work on diet, exercise and weight loss efforts. We will continue to monitor. Orders and follow up as documented in patient record. Anna Walker wears CPAP nightly and will continue.  Counseling  Sleep apnea is a condition in which breathing pauses or becomes shallow during sleep. This happens over and over during the night. This disrupts your sleep and keeps your body from getting the rest that it needs, which can cause tiredness and lack of energy (fatigue) during the day.  Sleep apnea treatment: If you were given a device to open your airway while you sleep, USE IT!  Sleep hygiene:   Limit or avoid alcohol, caffeinated beverages, and cigarettes, especially close to bedtime.   Do not eat a large meal or eat spicy foods right before bedtime. This can lead to digestive discomfort that can make it hard for you to sleep.  Keep a sleep diary to help you and your health care provider figure out what could be causing your insomnia.  . Make your bedroom a dark, comfortable place where it is easy to fall  asleep. ? Put up shades or blackout curtains to block light from outside. ? Use a white noise machine to block noise. ? Keep the temperature cool. . Limit screen use before bedtime. This includes: ? Watching TV. ? Using your smartphone, tablet, or computer. . Stick to a routine that includes going to bed and waking up at the same times every day and night. This can help you fall asleep faster. Consider making a quiet activity, such as reading, part of your nighttime routine. . Try to avoid taking naps during the day so that you sleep better at night. . Get out of bed if you are still awake after 15 minutes of trying to sleep. Keep the lights down, but try reading or doing a quiet activity. When you feel sleepy, go back to bed.  Vitamin D deficiency. Low Vitamin D level contributes to fatigue and are associated with obesity, breast, and colon cancer. VITAMIN D 25 Hydroxy (Vit-D Deficiency, Fractures) level ordered today.  Class 3 severe obesity with serious comorbidity and body mass index (BMI) of 45.0 to 49.9 in adult, unspecified obesity type (Anna Walker).  Anna Walker is currently in the action stage of change and her goal is to continue with weight loss efforts. I recommend Anna Walker begin the structured treatment plan as follows:  She  has agreed to the Category 3 Plan.  She will work on meal planning, mindful eating, and eliminating sugary drinks.  We reviewed with the patient labs from 01/27/2019 including BMP, CBC, and glucose; labs from 04/04/2019 including A1c, lipids, TSH, CBC, and CMP.  Exercise goals: All adults should avoid inactivity. Some physical activity is better than none, and adults who participate in any amount of physical activity gain some health benefits.   Behavioral modification strategies: increasing lean protein intake, decreasing simple carbohydrates, increasing vegetables, increasing water intake, decreasing eating out, no skipping meals, meal planning and cooking  strategies, keeping healthy foods in the home and planning for success.  She was informed of the importance of frequent follow-up visits to maximize her success with intensive lifestyle modifications for her multiple health conditions. She was informed we would discuss her lab results at her next visit unless there is a critical issue that needs to be addressed sooner. Anna Walker agreed to keep her next visit at the agreed upon time to discuss these results.  Objective:   Blood pressure 132/79, pulse 77, temperature 98.4 F (36.9 C), height 5\' 5"  (1.651 m), weight 277 lb (125.6 kg), SpO2 96 %. Body mass index is 46.1 kg/m.  EKG: Sinus  Rhythm with 74 BPM. Low voltage - may be related to body habitus. Otherwise normal.   Indirect Calorimeter completed today shows a VO2 of 279 and a REE of 1941.  Her calculated basal metabolic rate is 99991111 thus her basal metabolic rate is better than expected.  General: Cooperative, alert, well developed, in no acute distress. HEENT: Conjunctivae and lids unremarkable. Cardiovascular: Regular rhythm.  Lungs: Normal work of breathing. Neurologic: No focal deficits. Uses a cane for ambulation.  Lab Results  Component Value Date   CREATININE 0.85 01/27/2019   BUN 11 01/27/2019   NA 142 01/27/2019   K 4.0 01/27/2019   CL 107 01/27/2019   CO2 26 01/27/2019   Lab Results  Component Value Date   ALT 23 10/21/2018   AST 21 10/21/2018   ALKPHOS 80 10/21/2018   BILITOT 0.6 10/21/2018   Lab Results  Component Value Date   HGBA1C 6.0 (H) 08/14/2018   HGBA1C 5.9 (H) 02/09/2015   No results found for: INSULIN Lab Results  Component Value Date   TSH 1.507 02/09/2015   Lab Results  Component Value Date   CHOL 157 02/09/2015   HDL 34 (L) 02/09/2015   LDLCALC 72 02/09/2015   TRIG 256 (H) 02/09/2015   CHOLHDL 4.6 02/09/2015   Lab Results  Component Value Date   WBC 8.1 01/27/2019   HGB 13.0 01/27/2019   HCT 41.6 01/27/2019   MCV 88.5 01/27/2019    PLT 279 01/27/2019   No results found for: IRON, TIBC, FERRITIN  Obesity Behavioral Intervention Visit Documentation for Insurance:   Approximately 15 minutes were spent on the discussion below.  ASK: We discussed the diagnosis of obesity with Anna Walker today and Keelan agreed to give [place] permission to discuss obesity behavioral modification therapy today.  ASSESS: Shakota has the diagnosis of obesity and her BMI today is 46.2. Aianna is in the action stage of change.   ADVISE: Arelia was educated on the multiple health risks of obesity as well as the benefit of weight loss to improve her health. She was advised of the need for long term treatment and the importance of lifestyle modifications to improve her current health and to decrease her risk of future health problems.  AGREE: Multiple  dietary modification options and treatment options were discussed and Anna Walker agreed to follow the recommendations documented in the above note.  ARRANGE: Anna Walker was educated on the importance of frequent visits to treat obesity as outlined per CMS and USPSTF guidelines and agreed to schedule her next follow up appointment today.  Attestation Statements:   Reviewed by clinician on day of visit: allergies, medications, problem list, medical history, surgical history, family history, social history, and previous encounter notes.  Migdalia Dk, am acting as Location manager for [employer], DO   I have reviewed the above documentation for accuracy and completeness, and I agree with the above. Jearld Lesch, DO

## 2019-05-02 ENCOUNTER — Encounter (INDEPENDENT_AMBULATORY_CARE_PROVIDER_SITE_OTHER): Payer: Self-pay | Admitting: Bariatrics

## 2019-05-02 DIAGNOSIS — E559 Vitamin D deficiency, unspecified: Secondary | ICD-10-CM | POA: Insufficient documentation

## 2019-05-02 LAB — C-PEPTIDE: C-Peptide: 5 ng/mL — ABNORMAL HIGH (ref 1.1–4.4)

## 2019-05-02 LAB — VITAMIN D 25 HYDROXY (VIT D DEFICIENCY, FRACTURES): Vit D, 25-Hydroxy: 11.8 ng/mL — ABNORMAL LOW (ref 30.0–100.0)

## 2019-05-02 LAB — INSULIN, RANDOM: INSULIN: 22.3 u[IU]/mL (ref 2.6–24.9)

## 2019-05-15 ENCOUNTER — Other Ambulatory Visit: Payer: Self-pay

## 2019-05-15 ENCOUNTER — Ambulatory Visit (INDEPENDENT_AMBULATORY_CARE_PROVIDER_SITE_OTHER): Payer: PPO | Admitting: Bariatrics

## 2019-05-15 ENCOUNTER — Encounter (INDEPENDENT_AMBULATORY_CARE_PROVIDER_SITE_OTHER): Payer: Self-pay | Admitting: Bariatrics

## 2019-05-15 VITALS — BP 131/81 | HR 77 | Temp 98.3°F | Ht 65.0 in | Wt 273.0 lb

## 2019-05-15 DIAGNOSIS — R7303 Prediabetes: Secondary | ICD-10-CM

## 2019-05-15 DIAGNOSIS — Z6841 Body Mass Index (BMI) 40.0 and over, adult: Secondary | ICD-10-CM | POA: Diagnosis not present

## 2019-05-15 DIAGNOSIS — E559 Vitamin D deficiency, unspecified: Secondary | ICD-10-CM | POA: Diagnosis not present

## 2019-05-15 MED ORDER — VITAMIN D (ERGOCALCIFEROL) 1.25 MG (50000 UNIT) PO CAPS: 50000.0000 [IU] | ORAL_CAPSULE | ORAL | 0 refills | Status: DC

## 2019-05-16 ENCOUNTER — Encounter (INDEPENDENT_AMBULATORY_CARE_PROVIDER_SITE_OTHER): Payer: Self-pay | Admitting: Bariatrics

## 2019-05-16 NOTE — Progress Notes (Signed)
Chief Complaint:   OBESITY [employer] is here to discuss her progress with her obesity treatment plan along with follow-up of her obesity related diagnoses. Charlise is on the Category 3 Plan and states she is following her eating plan approximately 90% of the time. Timeca states she is walking 2500-3000 steps 7 day a week.  Today's visit was #: 2 Starting weight: 277 lbs Starting date: 05/01/2019 Today's weight: 273 lbs Today's date: 05/15/2019 Total lbs lost to date: 4 Total lbs lost since last in-office visit: 4  Interim History: Lakeva is down 4 lbs. She is disappointed that she only lost 4 lbs. She struggled with getting enough protein at dinner and says she will get hungry if she doesn't get in all of the protein. She states she saves snack calories for the end of the day. She is drinking coffee with 2 tsp of sugar and fat-free milk.  Subjective:   Vitamin D deficiency. Last Vitamin D 11.8 on 05/01/2019.  Prediabetes. Twinkle has a diagnosis of prediabetes based on her elevated HgA1c and was informed this puts her at greater risk of developing diabetes. She continues to work on diet and exercise to decrease her risk of diabetes. She denies nausea or hypoglycemia.  Lab Results  Component Value Date   HGBA1C 6.0 (H) 08/14/2018   Lab Results  Component Value Date   INSULIN 22.3 05/01/2019   Assessment/Plan:   Vitamin D deficiency. Low Vitamin D level contributes to fatigue and are associated with obesity, breast, and colon cancer. She will start Vitamin D, Ergocalciferol, (DRISDOL) 1.25 MG (50000 UNIT) CAPS capsule every week #4 with 0 refills and will follow-up for routine testing of Vitamin D, at least 2-3 times per year to avoid over-replacement.    Prediabetes. Clida will continue to work on weight loss, exercise, increasing healthy fats and protein, and decreasing simple carbohydrates to help decrease the risk of diabetes.   Class 3 severe obesity with  serious comorbidity and body mass index (BMI) of 45.0 to 49.9 in adult, unspecified obesity type ([place]).  Dnasia is currently in the action stage of change. As such, her goal is to continue with weight loss efforts. She has agreed to the Category 3 Plan.   She will work on meal planning and increasing her water intake.  We reviewed with the patient labs from 05/01/2019 including insulin, C-peptide, and Vitamin D.  Exercise goals: All adults should avoid inactivity. Some physical activity is better than none, and adults who participate in any amount of physical activity gain some health benefits.  Behavioral modification strategies: increasing lean protein intake, decreasing simple carbohydrates, increasing vegetables, increasing water intake, decreasing eating out, no skipping meals, meal planning and cooking strategies, keeping healthy foods in the home and planning for success.  Heidemarie has agreed to follow-up with our clinic in 2 weeks. She was informed of the importance of frequent follow-up visits to maximize her success with intensive lifestyle modifications for her multiple health conditions.   Objective:   Blood pressure 131/81, pulse 77, temperature 98.3 F (36.8 C), height 5\' 5"  (1.651 m), weight 273 lb (123.8 kg), SpO2 97 %. Body mass index is 45.43 kg/m.  General: Cooperative, alert, well developed, in no acute distress. HEENT: Conjunctivae and lids unremarkable. Cardiovascular: Regular rhythm.  Lungs: Normal work of breathing. Neurologic: No focal deficits. Uses a cane for ambulation.  Lab Results  Component Value Date   CREATININE 0.85 01/27/2019   BUN 11 01/27/2019  NA 142 01/27/2019   K 4.0 01/27/2019   CL 107 01/27/2019   CO2 26 01/27/2019   Lab Results  Component Value Date   ALT 23 10/21/2018   AST 21 10/21/2018   ALKPHOS 80 10/21/2018   BILITOT 0.6 10/21/2018   Lab Results  Component Value Date   HGBA1C 6.0 (H) 08/14/2018   HGBA1C 5.9 (H)  02/09/2015   Lab Results  Component Value Date   INSULIN 22.3 05/01/2019   Lab Results  Component Value Date   TSH 1.507 02/09/2015   Lab Results  Component Value Date   CHOL 157 02/09/2015   HDL 34 (L) 02/09/2015   LDLCALC 72 02/09/2015   TRIG 256 (H) 02/09/2015   CHOLHDL 4.6 02/09/2015   Lab Results  Component Value Date   WBC 8.1 01/27/2019   HGB 13.0 01/27/2019   HCT 41.6 01/27/2019   MCV 88.5 01/27/2019   PLT 279 01/27/2019   No results found for: IRON, TIBC, FERRITIN  Obesity Behavioral Intervention Documentation for Insurance:   Approximately 15 minutes were spent on the discussion below.  ASK: We discussed the diagnosis of obesity with Benjamine Mola today and Jamelia agreed to give [place] permission to discuss obesity behavioral modification therapy today.  ASSESS: Bushra has the diagnosis of obesity and her BMI today is 45.5. Alexias is in the action stage of change.   ADVISE: Wylodine was educated on the multiple health risks of obesity as well as the benefit of weight loss to improve her health. She was advised of the need for long term treatment and the importance of lifestyle modifications to improve her current health and to decrease her risk of future health problems.  AGREE: Multiple dietary modification options and treatment options were discussed and Kevin agreed to follow the recommendations documented in the above note.  ARRANGE: Amyra was educated on the importance of frequent visits to treat obesity as outlined per CMS and USPSTF guidelines and agreed to schedule her next follow up appointment today.  Attestation Statements:   Reviewed by clinician on day of visit: allergies, medications, problem list, medical history, surgical history, family history, social history, and previous encounter notes.  Migdalia Dk, am acting as Location manager for [employer], DO   I have reviewed the above documentation for accuracy and completeness,  and I agree with the above. Jearld Lesch, DO

## 2019-05-20 DIAGNOSIS — G4733 Obstructive sleep apnea (adult) (pediatric): Secondary | ICD-10-CM | POA: Diagnosis not present

## 2019-06-04 ENCOUNTER — Encounter (INDEPENDENT_AMBULATORY_CARE_PROVIDER_SITE_OTHER): Payer: Self-pay | Admitting: Bariatrics

## 2019-06-04 ENCOUNTER — Ambulatory Visit (INDEPENDENT_AMBULATORY_CARE_PROVIDER_SITE_OTHER): Payer: PPO | Admitting: Bariatrics

## 2019-06-04 ENCOUNTER — Other Ambulatory Visit: Payer: Self-pay

## 2019-06-04 VITALS — BP 118/72 | HR 72 | Temp 98.5°F | Ht 65.0 in | Wt 267.0 lb

## 2019-06-04 DIAGNOSIS — E559 Vitamin D deficiency, unspecified: Secondary | ICD-10-CM | POA: Diagnosis not present

## 2019-06-04 DIAGNOSIS — Z6841 Body Mass Index (BMI) 40.0 and over, adult: Secondary | ICD-10-CM | POA: Diagnosis not present

## 2019-06-04 DIAGNOSIS — I10 Essential (primary) hypertension: Secondary | ICD-10-CM

## 2019-06-04 MED ORDER — VITAMIN D (ERGOCALCIFEROL) 1.25 MG (50000 UNIT) PO CAPS: 50000.0000 [IU] | ORAL_CAPSULE | ORAL | 0 refills | Status: DC

## 2019-06-04 NOTE — Progress Notes (Signed)
Chief Complaint:   OBESITY Anna Walker is here to discuss her progress with her obesity treatment plan along with follow-up of her obesity related diagnoses. Anna Walker is on the Category 3 Plan and states she is following her eating plan approximately 75-80% of the time. Anna Walker states she is exercising 0 minutes 0 times per week.  Today's visit was #: 3 Starting weight: 277 lbs Starting date: 05/01/2019 Today's weight: 267 lbs Today's date: 06/04/2019 Total lbs lost to date: 10 Total lbs lost since last in-office visit: 6  Interim History: Anna Walker is down an additional 6 lbs and is doing well with the plan.  Subjective:   Vitamin D deficiency. No nausea, vomiting, or muscle weakness. Last Vitamin D 11.8 on 05/01/2019.  Essential hypertension. Anna Walker is taking HCTZ. Blood pressure is well controlled.  BP Readings from Last 3 Encounters:  06/04/19 118/72  05/15/19 131/81  05/01/19 132/79   Lab Results  Component Value Date   CREATININE 0.85 01/27/2019   CREATININE 1.05 (H) 10/21/2018   CREATININE 0.94 10/18/2018   Assessment/Plan:   Vitamin D deficiency. Low Vitamin D level contributes to fatigue and are associated with obesity, breast, and colon cancer. She was given a prescription for Vitamin D, Ergocalciferol, (DRISDOL) 1.25 MG (50000 UNIT) CAPS capsule every week #4 with 0 refills and will follow-up for routine testing of Vitamin D, at least 2-3 times per year to avoid over-replacement.    Essential hypertension. Anna Walker is working on healthy weight loss and exercise to improve blood pressure control. We will watch for signs of hypotension as she continues her lifestyle modifications. She will continue her medications as directed.  Class 3 severe obesity with serious comorbidity and body mass index (BMI) of 40.0 to 44.9 in adult, unspecified obesity type ([place]).  Anna Walker is currently in the action stage of change. As such, her goal is to continue with  weight loss efforts. She has agreed to the Category 3 Plan alternating with the Pescatarian plan + 300 calories.  She will work on meal planning and intentional eating.   Exercise goals: Anna Walker will start walking for exercise.  Behavioral modification strategies: increasing lean protein intake, decreasing simple carbohydrates, increasing vegetables, increasing water intake, decreasing eating out, no skipping meals, meal planning and cooking strategies, keeping healthy foods in the home and planning for success.  Anna Walker has agreed to follow-up with our clinic in 2 weeks. She was informed of the importance of frequent follow-up visits to maximize her success with intensive lifestyle modifications for her multiple health conditions.   Objective:   Blood pressure 118/72, pulse 72, temperature 98.5 F (36.9 C), height 5\' 5"  (1.651 m), weight 267 lb (121.1 kg), SpO2 98 %. Body mass index is 44.43 kg/m.  General: Cooperative, alert, well developed, in no acute distress. HEENT: Conjunctivae and lids unremarkable. Cardiovascular: Regular rhythm.  Lungs: Normal work of breathing. Neurologic: No focal deficits.  Extremities: Anna Walker has a brace on the left ankle and foot; is using a cane for ambulation.  Lab Results  Component Value Date   CREATININE 0.85 01/27/2019   BUN 11 01/27/2019   NA 142 01/27/2019   K 4.0 01/27/2019   CL 107 01/27/2019   CO2 26 01/27/2019   Lab Results  Component Value Date   ALT 23 10/21/2018   AST 21 10/21/2018   ALKPHOS 80 10/21/2018   BILITOT 0.6 10/21/2018   Lab Results  Component Value Date   HGBA1C 6.0 (H) 08/14/2018  HGBA1C 5.9 (H) 02/09/2015   Lab Results  Component Value Date   INSULIN 22.3 05/01/2019   Lab Results  Component Value Date   TSH 1.507 02/09/2015   Lab Results  Component Value Date   CHOL 157 02/09/2015   HDL 34 (L) 02/09/2015   LDLCALC 72 02/09/2015   TRIG 256 (H) 02/09/2015   CHOLHDL 4.6 02/09/2015   Lab Results    Component Value Date   WBC 8.1 01/27/2019   HGB 13.0 01/27/2019   HCT 41.6 01/27/2019   MCV 88.5 01/27/2019   PLT 279 01/27/2019   No results found for: IRON, TIBC, FERRITIN  Obesity Behavioral Intervention Documentation for Insurance:   Approximately 15 minutes were spent on the discussion below.  ASK: We discussed the diagnosis of obesity with Anna Walker today and Anna Walker agreed to give [place] permission to discuss obesity behavioral modification therapy today.  ASSESS: Anna Walker has the diagnosis of obesity and her BMI today is 44.5. Anna Walker is in the action stage of change.   ADVISE: Anna Walker was educated on the multiple health risks of obesity as well as the benefit of weight loss to improve her health. She was advised of the need for long term treatment and the importance of lifestyle modifications to improve her current health and to decrease her risk of future health problems.  AGREE: Multiple dietary modification options and treatment options were discussed and Anna Walker agreed to follow the recommendations documented in the above note.  ARRANGE: Anna Walker was educated on the importance of frequent visits to treat obesity as outlined per CMS and USPSTF guidelines and agreed to schedule her next follow up appointment today.  Attestation Statements:   Reviewed by clinician on day of visit: allergies, medications, problem list, medical history, surgical history, family history, social history, and previous encounter notes.  Migdalia Dk, am acting as Location manager for [employer], DO   I have reviewed the above documentation for accuracy and completeness, and I agree with the above. Jearld Lesch, DO

## 2019-06-05 ENCOUNTER — Encounter (INDEPENDENT_AMBULATORY_CARE_PROVIDER_SITE_OTHER): Payer: Self-pay | Admitting: Bariatrics

## 2019-06-14 ENCOUNTER — Encounter (INDEPENDENT_AMBULATORY_CARE_PROVIDER_SITE_OTHER): Payer: Self-pay

## 2019-06-18 ENCOUNTER — Ambulatory Visit (INDEPENDENT_AMBULATORY_CARE_PROVIDER_SITE_OTHER): Payer: PPO | Admitting: Bariatrics

## 2019-06-18 ENCOUNTER — Encounter (INDEPENDENT_AMBULATORY_CARE_PROVIDER_SITE_OTHER): Payer: Self-pay | Admitting: Bariatrics

## 2019-06-18 ENCOUNTER — Other Ambulatory Visit: Payer: Self-pay

## 2019-06-18 VITALS — BP 129/78 | HR 71 | Temp 98.2°F | Ht 65.0 in | Wt 264.0 lb

## 2019-06-18 DIAGNOSIS — G4733 Obstructive sleep apnea (adult) (pediatric): Secondary | ICD-10-CM | POA: Diagnosis not present

## 2019-06-18 DIAGNOSIS — Z6841 Body Mass Index (BMI) 40.0 and over, adult: Secondary | ICD-10-CM | POA: Diagnosis not present

## 2019-06-18 DIAGNOSIS — Z9989 Dependence on other enabling machines and devices: Secondary | ICD-10-CM | POA: Diagnosis not present

## 2019-06-18 DIAGNOSIS — E559 Vitamin D deficiency, unspecified: Secondary | ICD-10-CM | POA: Diagnosis not present

## 2019-06-18 MED ORDER — VITAMIN D (ERGOCALCIFEROL) 1.25 MG (50000 UNIT) PO CAPS: 50000.0000 [IU] | ORAL_CAPSULE | ORAL | 0 refills | Status: DC

## 2019-06-18 NOTE — Progress Notes (Signed)
Chief Complaint:   OBESITY [name] is here to discuss her progress with her obesity treatment plan along with follow-up of her obesity related diagnoses. Anna Walker is on the Category 3 Plan and states she is following her eating plan approximately 65% of the time. Anna Walker states she is exercising 0 minutes 0 times per week.  Today's visit was #: 4 Starting weight: 277 lbs Starting date: 05/01/2019 Today's weight: 264 lbs Today's date: 06/18/2019 Total lbs lost to date: 13 Total lbs lost since last in-office visit: 3  Interim History: Anna Walker is down 3 lbs and doing well overall.  Subjective:   Vitamin D deficiency. No nausea, vomiting, or muscle weakness. Last Vitamin D 11.8 on 05/01/2019.  OSA on CPAP. Anna Walker is using CPAP and reports sleep but is not restful.  Assessment/Plan:   Vitamin D deficiency. Low Vitamin D level contributes to fatigue and are associated with obesity, breast, and colon cancer. She was given a prescription for Vitamin D, Ergocalciferol, (DRISDOL) 1.25 MG (50000 UNIT) CAPS capsule every week #4 with 0 refills and will follow-up for routine testing of Vitamin D, at least 2-3 times per year to avoid over-replacement.   OSA on CPAP. Intensive lifestyle modifications are the first line treatment for this issue. We discussed several lifestyle modifications today and she will continue to work on diet, exercise and weight loss efforts. We will continue to monitor. Orders and follow up as documented in patient record. Anna Walker will continue to use CPAP nightly.  Counseling  Sleep apnea is a condition in which breathing pauses or becomes shallow during sleep. This happens over and over during the night. This disrupts your sleep and keeps your body from getting the rest that it needs, which can cause tiredness and lack of energy (fatigue) during the day.  Sleep apnea treatment: If you were given a device to open your airway while you sleep, USE  IT!  Sleep hygiene:   Limit or avoid alcohol, caffeinated beverages, and cigarettes, especially close to bedtime.   Do not eat a large meal or eat spicy foods right before bedtime. This can lead to digestive discomfort that can make it hard for you to sleep.  Keep a sleep diary to help you and your health care provider figure out what could be causing your insomnia.  . Make your bedroom a dark, comfortable place where it is easy to fall asleep. ? Put up shades or blackout curtains to block light from outside. ? Use a white noise machine to block noise. ? Keep the temperature cool. . Limit screen use before bedtime. This includes: ? Watching TV. ? Using your smartphone, tablet, or computer. . Stick to a routine that includes going to bed and waking up at the same times every day and night. This can help you fall asleep faster. Consider making a quiet activity, such as reading, part of your nighttime routine. . Try to avoid taking naps during the day so that you sleep better at night. . Get out of bed if you are still awake after 15 minutes of trying to sleep. Keep the lights down, but try reading or doing a quiet activity. When you feel sleepy, go back to bed.  Class 3 severe obesity with serious comorbidity and body mass index (BMI) of 40.0 to 44.9 in adult, unspecified obesity type (Anna Walker).  Anna Walker is currently in the action stage of change. As such, her goal is to continue with weight loss efforts. She has  agreed to the Category 3 Plan alternating with the [place] + 300 calories for snacks.   She will work on meal planning and intentional eating. Handouts were given on "Eating Out", "Protein Equivalents", and "Making Smart Fruit Choices."  Exercise goals: Anna Walker will walk as tolerated; weed eating and yard work.  Behavioral modification strategies: increasing lean protein intake, decreasing simple carbohydrates, increasing vegetables, increasing water intake, decreasing  eating out, no skipping meals, meal planning and cooking strategies, keeping healthy foods in the home and planning for success.  Anna Walker has agreed to follow-up with our clinic in 4 weeks. She was informed of the importance of frequent follow-up visits to maximize her success with intensive lifestyle modifications for her multiple health conditions.   Objective:   Blood pressure 129/78, pulse 71, temperature 98.2 F (36.8 C), temperature source Oral, height 5\' 5"  (1.651 m), weight 264 lb (119.7 kg), SpO2 94 %. Body mass index is 43.93 kg/m.  General: Cooperative, alert, well developed, in no acute distress. HEENT: Conjunctivae and lids unremarkable. Cardiovascular: Regular rhythm.  Lungs: Normal work of breathing. Neurologic: No focal deficits. Using a cane for ambulation.  Lab Results  Component Value Date   CREATININE 0.85 01/27/2019   BUN 11 01/27/2019   NA 142 01/27/2019   K 4.0 01/27/2019   CL 107 01/27/2019   CO2 26 01/27/2019   Lab Results  Component Value Date   ALT 23 10/21/2018   AST 21 10/21/2018   ALKPHOS 80 10/21/2018   BILITOT 0.6 10/21/2018   Lab Results  Component Value Date   HGBA1C 6.0 (H) 08/14/2018   HGBA1C 5.9 (H) 02/09/2015   Lab Results  Component Value Date   INSULIN 22.3 05/01/2019   Lab Results  Component Value Date   TSH 1.507 02/09/2015   Lab Results  Component Value Date   CHOL 157 02/09/2015   HDL 34 (L) 02/09/2015   LDLCALC 72 02/09/2015   TRIG 256 (H) 02/09/2015   CHOLHDL 4.6 02/09/2015   Lab Results  Component Value Date   WBC 8.1 01/27/2019   HGB 13.0 01/27/2019   HCT 41.6 01/27/2019   MCV 88.5 01/27/2019   PLT 279 01/27/2019   No results found for: IRON, TIBC, FERRITIN  Obesity Behavioral Intervention Documentation for Insurance:   Approximately 15 minutes were spent on the discussion below.  ASK: We discussed the diagnosis of obesity with Anna Walker today and Anna Walker agreed to give [place] permission to discuss  obesity behavioral modification therapy today.  ASSESS: Anna Walker has the diagnosis of obesity and her BMI today is 44.0. Anna Walker is in the action stage of change.   ADVISE: Anna Walker was educated on the multiple health risks of obesity as well as the benefit of weight loss to improve her health. She was advised of the need for long term treatment and the importance of lifestyle modifications to improve her current health and to decrease her risk of future health problems.  AGREE: Multiple dietary modification options and treatment options were discussed and Anna Walker agreed to follow the recommendations documented in the above note.  ARRANGE: Anna Walker was educated on the importance of frequent visits to treat obesity as outlined per CMS and USPSTF guidelines and agreed to schedule her next follow up appointment today.  Attestation Statements:   Reviewed by clinician on day of visit: allergies, medications, problem list, medical history, surgical history, family history, social history, and previous encounter notes.  Anna Walker, am acting as Location manager for [employer], DO  I have reviewed the above documentation for accuracy and completeness, and I agree with the above. Jearld Lesch, DO

## 2019-06-19 ENCOUNTER — Encounter (INDEPENDENT_AMBULATORY_CARE_PROVIDER_SITE_OTHER): Payer: Self-pay | Admitting: Bariatrics

## 2019-06-19 DIAGNOSIS — G4733 Obstructive sleep apnea (adult) (pediatric): Secondary | ICD-10-CM | POA: Diagnosis not present

## 2019-07-08 ENCOUNTER — Ambulatory Visit (INDEPENDENT_AMBULATORY_CARE_PROVIDER_SITE_OTHER): Payer: PPO

## 2019-07-08 ENCOUNTER — Other Ambulatory Visit: Payer: Self-pay

## 2019-07-08 ENCOUNTER — Ambulatory Visit (HOSPITAL_COMMUNITY)
Admission: EM | Admit: 2019-07-08 | Discharge: 2019-07-08 | Disposition: A | Discharge status: Home / Self Care | Payer: PPO | Attending: Family Medicine | Admitting: Family Medicine

## 2019-07-08 ENCOUNTER — Encounter (HOSPITAL_COMMUNITY): Payer: Self-pay

## 2019-07-08 DIAGNOSIS — M25531 Pain in right wrist: Secondary | ICD-10-CM | POA: Diagnosis not present

## 2019-07-08 DIAGNOSIS — S6992XA Unspecified injury of left wrist, hand and finger(s), initial encounter: Secondary | ICD-10-CM | POA: Diagnosis not present

## 2019-07-08 NOTE — Discharge Instructions (Addendum)
Use ice for 20 minutes every 2-4 hours to reduce pain and swelling Elevate hand Wear brace while active Take Aleve 2 pills in the morning 2 pills at night with food Take pain medicine if needed See your doctor if not improving in 3 weeks

## 2019-07-08 NOTE — ED Provider Notes (Signed)
[facility]    CSN: [id number] Arrival date & time: 07/08/19  1140      History   Chief Complaint Chief Complaint  Patient presents with  . Wrist Pain    HPI Anna Walker is a 64 y.o. female.   HPI   Patient states that she did a lot of heavy work with her left hand yesterday.  She was pulling on heavy furniture.  She states that when she was done she came in the house and reached out to grab a towel off of a towel rack, and had an immediate severe pain in her left wrist.  There is no fall.  There is no trauma.  The pain is in the radial aspect of the wrist and the thumb.  Swelling all across the hand.  Pain with finger movement.  Pain with thumb movement.  No prior fracture.  She does not have a history of osteopenia or osteoporosis but does have a history of vitamin D deficiency  Past Medical History:  Diagnosis Date  . ADD (attention deficit disorder)   . Anxiety   . Biliary colic   . Bladder leak   . Diabetes mellitus without complication (HCC)    type 2 , diet controlled   . Diverticular disease   . DVT (deep venous thrombosis) (HCC)    left leg , resolved after treatment of blood thinners   . Edema of both lower extremities   . GERD (gastroesophageal reflux disease)    causes coughing   . Glaucoma   . High cholesterol   . Hypertension   . Hypothyroid   . Leg cramps    "in my thighs"   . Leg cramps   . Migraines   . Multiple sclerosis ([place])    per patient " that ha sbeen ruled out , they though it was that initially but it did not preogress like it"  . Optic neuritis    lesion on right optic nerve; right eye only , reduced vision and blurriness , loss of depth perception, glasses do not help when it flares    . OSA (obstructive sleep apnea)    nightly cpap   . Paresthesias/numbness    fingers and toes   . Pneumonia 2017  . Restless leg syndrome   . Sleep apnea   . SOB (shortness of breath)   . Thyroid disease    Hypothyroidism     Patient Active Problem List   Diagnosis Date Noted  . Vitamin D deficiency 05/02/2019  . Morbid obesity ([place]) 05/01/2019  . Chronic pain of both knees 04/25/2017  . DOE (dyspnea on exertion)   . Substernal chest pain 02/08/2015  . Attention deficit disorder 12/22/2014  . History of optic neuritis 06/20/2014  . OSA on CPAP 06/20/2014  . Restless leg syndrome 06/20/2014  . Other fatigue 06/20/2014  . Migraine without aura and without status migrainosus, not intractable 06/20/2014  . possible MS 02/24/2014  . Hypothyroidism 02/24/2014  . GERD (gastroesophageal reflux disease) 02/24/2014    Past Surgical History:  Procedure Laterality Date  . ABDOMINAL HYSTERECTOMY    . BREAST LUMPECTOMY Left   . CESAREAN SECTION     x2   . CHOLECYSTECTOMY N/A 08/16/2018   Procedure: LAPAROSCOPIC CHOLECYSTECTOMY;  Surgeon: Greer Pickerel, MD;  Location: [facility]S;  Service: General;  Laterality: N/A;  . COLONOSCOPY WITH PROPOFOL N/A 01/03/2019   Procedure: COLONOSCOPY WITH PROPOFOL;  Surgeon: Wonda Horner, MD;  Location: [facility] ENDOSCOPY;  Service: Endoscopy;  Laterality: N/A;  . MENISCUS REPAIR Left left  . MENISCUS REPAIR Right    knee   . TUBAL LIGATION     during 2nd c section     OB History    Gravida  2   Para  2   Term      Preterm      AB      Living        SAB      TAB      Ectopic      Multiple      Live Births               Home Medications    Prior to Admission medications   Medication Sig Start Date End Date Taking? Authorizing Provider  acetaminophen (TYLENOL) 500 MG tablet Take 500 mg by mouth every 6 (six) hours as needed.    [provider]  amantadine (SYMMETREL) 100 MG capsule TAKE 1 CAPSULE(100 MG) BY MOUTH THREE TIMES DAILY Patient taking differently: Take 100-200 mg by mouth See admin instructions. TAKE 2 CAPSULES (200 MG) BY MOUTH SCHEDULED EVERY MORNING & MAY TAKE 1 CAPSULE (100 MG) BY MOUTH IN THE AFTERNOON AS NEEDED FOR MULTIPLE  SCLEROSIS SYMPTOMS 07/09/18   Pieter Partridge, DO  ASPERCREME LIDOCAINE EX Apply topically.    [provider]  aspirin (ASPIRIN EC) 81 MG EC tablet Take 81 mg by mouth daily. Swallow whole.    [provider]  FLUoxetine (PROZAC) 10 MG capsule Take 10 mg by mouth at bedtime. Take with 20 mg dose 03/19/17   [provider]  FLUoxetine (PROZAC) 20 MG capsule Take 20 mg by mouth at bedtime. Take with 10 mg dose 06/21/18   [provider]  gabapentin (NEURONTIN) 300 MG capsule TAKE 1 CAPSULE BY MOUTH AT [facility] AND 2 CAPSULES AT BEDTIME 04/29/19   Tomi Likens, Adam R, DO  hydrochlorothiazide (HYDRODIURIL) 12.5 MG tablet Take 12.5 mg by mouth daily.  02/06/17   [provider]  levothyroxine (SYNTHROID, LEVOTHROID) 150 MCG tablet Take 150 mcg by mouth daily before breakfast.    [provider]  omeprazole (PRILOSEC) 40 MG capsule Take 1 capsule (40 mg total) by mouth 2 (two) times daily before a meal. 02/10/15   Barton Dubois, MD  ondansetron (ZOFRAN) 4 MG tablet Take 4 mg by mouth every 8 (eight) hours as needed for nausea or vomiting.    [provider]  oxybutynin (DITROPAN) 5 MG tablet Take 5 mg by mouth at bedtime.     [provider]  rOPINIRole (REQUIP) 3 MG tablet TAKE 1 TABLET BY MOUTH DAILY AT DINNER AND 1 TABLET AT BEDTIME. PLEASE CALL [phone] TO SCHEDULE AN APPOINTMENT 02/18/19   Pieter Partridge, DO  simvastatin (ZOCOR) 10 MG tablet Take 10 mg by mouth every evening.     [provider]  SUMAtriptan (IMITREX) 100 MG tablet Take 1 tablet (100 mg total) by mouth once as needed for migraine. May repeat in 2 hours if headache persists or recurs. 09/21/18   Pieter Partridge, DO  Vitamin D, Ergocalciferol, (DRISDOL) 1.25 MG (50000 UNIT) CAPS capsule Take 1 capsule (50,000 Units total) by mouth every 7 (seven) days. 06/18/19   Georgia Lopes, DO    Family History Family History  Problem Relation Age of Onset  . Barrett's esophagus Mother    . Hypertension Mother   . Obesity Mother   . Graves' disease Sister   .  Rheum arthritis Sister   . Heart attack Father 11  . Sudden death Father   . Coronary artery disease Neg Hx     Social History Social History   Tobacco Use  . Smoking status: Former Smoker    Quit date: 01/25/1984    Years since quitting: 35.4  . Smokeless tobacco: Never Used  Vaping Use  . Vaping Use: Never used  Substance Use Topics  . Alcohol use: Not Currently  . Drug use: No     Allergies   Adderall [amphetamine-dextroamphetamine] and Topamax [topiramate]   Review of Systems Review of Systems  Musculoskeletal: Positive for arthralgias.  See HPI  Physical Exam Triage Vital Signs ED Triage Vitals  Enc Vitals Group     BP 07/08/19 1234 118/70     Pulse Rate 07/08/19 1234 72     Resp 07/08/19 1234 14     Temp 07/08/19 1234 98.4 F (36.9 C)     Temp Source 07/08/19 1234 Oral     SpO2 07/08/19 1234 100 %     Weight --      Height --      Head Circumference --      Peak Flow --      Pain Score 07/08/19 1231 8     Pain Loc --      Pain Edu? --      Excl. in [place]? --    No data found.  Updated Vital Signs BP 118/70 (BP Location: Right Arm)   Pulse 72   Temp 98.4 F (36.9 C) (Oral)   Resp 14   SpO2 100%   Visual Acuity Right Eye Distance:   Left Eye Distance:   Bilateral Distance:    Right Eye Near:   Left Eye Near:    Bilateral Near:     Physical Exam Constitutional:      General: She is not in acute distress.    Appearance: She is well-developed. She is obese.  HENT:     Head: Normocephalic and atraumatic.  Eyes:     Conjunctiva/sclera: Conjunctivae normal.     Pupils: Pupils are equal, round, and reactive to light.  Cardiovascular:     Rate and Rhythm: Normal rate.  Pulmonary:     Effort: Pulmonary effort is normal. No respiratory distress.  Musculoskeletal:        General: Normal range of motion.     Cervical back: Normal range of motion.     Comments: Cradles  left wrist close to body.  Mild diffuse swelling of hand and fingers.  Tenderness over the anatomic snuffbox.  Tenderness across the volar aspect of the wrist over the flexor tendons.  Tenderness in the forearm  Skin:    General: Skin is warm and dry.  Neurological:     Mental Status: She is alert.     Comments: Uses cane for ambulation      UC Treatments / Results  Labs (all labs ordered are listed, but only abnormal results are displayed) Labs Reviewed - No data to display  EKG   Radiology DG Wrist Complete Left  Result Date: 07/08/2019 CLINICAL DATA:  The patient suffered a left wrist injury moving furniture last night. Initial encounter. EXAM: LEFT WRIST - COMPLETE 3+ VIEW COMPARISON:  Plain films of the left hand 11/27/2017. FINDINGS: There is no evidence of fracture or dislocation. There is no evidence of arthropathy or other focal bone abnormality. Soft tissues are unremarkable. IMPRESSION: Negative exam. Electronically Signed  By: Inge Rise M.D.   On: 07/08/2019 12:51    Procedures Procedures (including critical care time)  Medications Ordered in UC Medications - No data to display  Initial Impression / Assessment and Plan / UC Course  I have reviewed the triage vital signs and the nursing notes.  Pertinent labs & imaging results that were available during my care of the patient were reviewed by me and considered in my medical decision making (see chart for details).     No fracture.  Wrist strain is discussed.  Conservative management. Final Clinical Impressions(s) / UC Diagnoses   Final diagnoses:  Right wrist pain     Discharge Instructions     Use ice for 20 minutes every 2-4 hours to reduce pain and swelling Elevate hand Wear brace while active Take Aleve 2 pills in the morning 2 pills at night with food Take pain medicine if needed See your doctor if not improving in 3 weeks   ED Prescriptions    None     PDMP not reviewed this  encounter.   Raylene Everts, MD 07/08/19 [phone]

## 2019-07-08 NOTE — ED Triage Notes (Signed)
Patient reports she was moving something heavy last night. When finished, went inside to grab a towel and had sharp shooting pains in the left wrist.

## 2019-07-16 ENCOUNTER — Encounter (INDEPENDENT_AMBULATORY_CARE_PROVIDER_SITE_OTHER): Payer: Self-pay | Admitting: Bariatrics

## 2019-07-16 ENCOUNTER — Ambulatory Visit (INDEPENDENT_AMBULATORY_CARE_PROVIDER_SITE_OTHER): Payer: PPO | Admitting: Bariatrics

## 2019-07-16 ENCOUNTER — Other Ambulatory Visit: Payer: Self-pay

## 2019-07-16 VITALS — BP 110/70 | HR 69 | Temp 97.9°F | Ht 65.0 in | Wt 264.0 lb

## 2019-07-16 DIAGNOSIS — I10 Essential (primary) hypertension: Secondary | ICD-10-CM

## 2019-07-16 DIAGNOSIS — Z6841 Body Mass Index (BMI) 40.0 and over, adult: Secondary | ICD-10-CM

## 2019-07-16 DIAGNOSIS — E559 Vitamin D deficiency, unspecified: Secondary | ICD-10-CM | POA: Diagnosis not present

## 2019-07-16 MED ORDER — VITAMIN D (ERGOCALCIFEROL) 1.25 MG (50000 UNIT) PO CAPS: 50000.0000 [IU] | ORAL_CAPSULE | ORAL | 0 refills | Status: DC

## 2019-07-17 ENCOUNTER — Encounter (INDEPENDENT_AMBULATORY_CARE_PROVIDER_SITE_OTHER): Payer: Self-pay | Admitting: Bariatrics

## 2019-07-17 NOTE — Progress Notes (Signed)
Chief Complaint:   OBESITY Yachats is here to discuss her progress with her obesity treatment plan along with follow-up of her obesity related diagnoses. Aiva is on the Category 3 Plan + 300 calorie snacks and states she is following her eating plan approximately 70% of the time. Melysa states she is exercising 0 minutes 0 times per week.  Today's visit was #: 5 Starting weight: 277 lbs Starting date: 05/01/2019 Today's weight: 264 lbs Today's date: 07/16/2019 Total lbs lost to date: 13 Total lbs lost since last in-office visit: 0  Interim History: Falicia's weight remains the same and she is doing well overall. She is having a hard time preparing food and has on a brace.  Subjective:   Essential hypertension. Blood pressure is well controlled.  BP Readings from Last 3 Encounters:  07/16/19 110/70  07/08/19 118/70  06/18/19 129/78   Lab Results  Component Value Date   CREATININE 0.85 01/27/2019   CREATININE 1.05 (H) 10/21/2018   CREATININE 0.94 10/18/2018   Vitamin D deficiency. No nausea, vomiting, or muscle weakness. Last Vitamin D was 11.8 on 05/01/2019.  Assessment/Plan:   Essential hypertension. Ebelin is working on healthy weight loss and exercise to improve blood pressure control. We will watch for signs of hypotension as she continues her lifestyle modifications. She will continue her medication as directed.  Vitamin D deficiency. Low Vitamin D level contributes to fatigue and are associated with obesity, breast, and colon cancer. She was given a prescription for Vitamin D, Ergocalciferol, (DRISDOL) 1.25 MG (50000 UNIT) CAPS capsule every week #4 with 0 refills and will follow-up for routine testing of Vitamin D, at least 2-3 times per year to avoid over-replacement.   Class 3 severe obesity with serious comorbidity and body mass index (BMI) of 40.0 to 44.9 in adult, unspecified obesity type ([place]).  Keamber is currently in the action stage of  change. As such, her goal is to continue with weight loss efforts. She has agreed to the Category 3 Plan and the [place] +300 calorie snacks.Marland Kitchen   She will work on meal planning, intentional eating, and increasing her water intake.   Ideas were given for dinner.  Exercise goals: Dinisha will be more active.  Behavioral modification strategies: increasing lean protein intake, decreasing simple carbohydrates, increasing vegetables, increasing water intake, decreasing eating out, no skipping meals, meal planning and cooking strategies, keeping healthy foods in the home, ways to avoid boredom eating and planning for success.  Sheresa has agreed to follow-up with our clinic in 2 weeks. She was informed of the importance of frequent follow-up visits to maximize her success with intensive lifestyle modifications for her multiple health conditions.   Objective:   Blood pressure 110/70, pulse 69, temperature 97.9 F (36.6 C), height 5\' 5"  (1.651 m), weight 264 lb (119.7 kg), SpO2 98 %. Body mass index is 43.93 kg/m.  General: Cooperative, alert, well developed, in no acute distress. HEENT: Conjunctivae and lids unremarkable. Cardiovascular: Regular rhythm.  Lungs: Normal work of breathing. Neurologic: No focal deficits.   Lab Results  Component Value Date   CREATININE 0.85 01/27/2019   BUN 11 01/27/2019   NA 142 01/27/2019   K 4.0 01/27/2019   CL 107 01/27/2019   CO2 26 01/27/2019   Lab Results  Component Value Date   ALT 23 10/21/2018   AST 21 10/21/2018   ALKPHOS 80 10/21/2018   BILITOT 0.6 10/21/2018   Lab Results  Component Value Date  HGBA1C 6.0 (H) 08/14/2018   HGBA1C 5.9 (H) 02/09/2015   Lab Results  Component Value Date   INSULIN 22.3 05/01/2019   Lab Results  Component Value Date   TSH 1.507 02/09/2015   Lab Results  Component Value Date   CHOL 157 02/09/2015   HDL 34 (L) 02/09/2015   LDLCALC 72 02/09/2015   TRIG 256 (H) 02/09/2015   CHOLHDL 4.6  02/09/2015   Lab Results  Component Value Date   WBC 8.1 01/27/2019   HGB 13.0 01/27/2019   HCT 41.6 01/27/2019   MCV 88.5 01/27/2019   PLT 279 01/27/2019   No results found for: IRON, TIBC, FERRITIN  Obesity Behavioral Intervention Documentation for Insurance:   Approximately 15 minutes were spent on the discussion below.  ASK: We discussed the diagnosis of obesity with Benjamine Mola today and Shaakira agreed to give [place] permission to discuss obesity behavioral modification therapy today.  ASSESS: Tykira has the diagnosis of obesity and her BMI today is 43.3. Julane is in the action stage of change.   ADVISE: Nancey was educated on the multiple health risks of obesity as well as the benefit of weight loss to improve her health. She was advised of the need for long term treatment and the importance of lifestyle modifications to improve her current health and to decrease her risk of future health problems.  AGREE: Multiple dietary modification options and treatment options were discussed and Samiyyah agreed to follow the recommendations documented in the above note.  ARRANGE: Racheal was educated on the importance of frequent visits to treat obesity as outlined per CMS and USPSTF guidelines and agreed to schedule her next follow up appointment today.  Attestation Statements:   Reviewed by clinician on day of visit: allergies, medications, problem list, medical history, surgical history, family history, social history, and previous encounter notes.  Migdalia Dk, am acting as Location manager for [employer], DO   I have reviewed the above documentation for accuracy and completeness, and I agree with the above. Jearld Lesch, DO

## 2019-07-20 DIAGNOSIS — G4733 Obstructive sleep apnea (adult) (pediatric): Secondary | ICD-10-CM | POA: Diagnosis not present

## 2019-08-06 ENCOUNTER — Ambulatory Visit (INDEPENDENT_AMBULATORY_CARE_PROVIDER_SITE_OTHER): Payer: PPO | Admitting: Bariatrics

## 2019-08-06 ENCOUNTER — Encounter (INDEPENDENT_AMBULATORY_CARE_PROVIDER_SITE_OTHER): Payer: Self-pay | Admitting: Bariatrics

## 2019-08-16 DIAGNOSIS — M25539 Pain in unspecified wrist: Secondary | ICD-10-CM | POA: Diagnosis not present

## 2019-08-21 DIAGNOSIS — M79642 Pain in left hand: Secondary | ICD-10-CM | POA: Diagnosis not present

## 2019-08-29 ENCOUNTER — Other Ambulatory Visit (INDEPENDENT_AMBULATORY_CARE_PROVIDER_SITE_OTHER): Payer: Self-pay | Admitting: Bariatrics

## 2019-08-29 ENCOUNTER — Other Ambulatory Visit: Payer: Self-pay

## 2019-08-29 ENCOUNTER — Encounter (INDEPENDENT_AMBULATORY_CARE_PROVIDER_SITE_OTHER): Payer: Self-pay | Admitting: Bariatrics

## 2019-08-29 ENCOUNTER — Ambulatory Visit (INDEPENDENT_AMBULATORY_CARE_PROVIDER_SITE_OTHER): Payer: PPO | Admitting: Bariatrics

## 2019-08-29 VITALS — BP 129/79 | HR 75 | Temp 98.2°F | Ht 65.0 in | Wt 262.0 lb

## 2019-08-29 DIAGNOSIS — R7303 Prediabetes: Secondary | ICD-10-CM | POA: Diagnosis not present

## 2019-08-29 DIAGNOSIS — Z6841 Body Mass Index (BMI) 40.0 and over, adult: Secondary | ICD-10-CM

## 2019-08-29 DIAGNOSIS — E559 Vitamin D deficiency, unspecified: Secondary | ICD-10-CM | POA: Diagnosis not present

## 2019-08-29 MED ORDER — VITAMIN D (ERGOCALCIFEROL) 1.25 MG (50000 UNIT) PO CAPS: 50000.0000 [IU] | ORAL_CAPSULE | ORAL | 0 refills | Status: DC

## 2019-09-02 NOTE — Progress Notes (Signed)
Chief Complaint:   OBESITY Anna Walker is here to discuss her progress with her obesity treatment plan along with follow-up of her obesity related diagnoses. Anna Walker is on the Category 3 Plan and states she is following her eating plan approximately 50% of the time. Anna Walker states she is exercising 0 minutes 0 times per week.  Today's visit was #: 6 Starting weight: 277 lbs Starting date: 05/01/2019 Today's weight: 262 lbs Today's date: 08/29/2019 Total lbs lost to date: 15 Total lbs lost since last in-office visit: 2  Interim History: Anna Walker is down 2 lbs and is doing well overall. She has pain at night in her bilateral knees, right greater than left. She states it has been difficult to stay on plan. Husband has had toes amputated.  Subjective:   Prediabetes. Anna Walker has a diagnosis of prediabetes based on her elevated HgA1c and was informed this puts her at greater risk of developing diabetes. She continues to work on diet and exercise to decrease her risk of diabetes. She denies nausea or hypoglycemia. No polyphagia. Anna Walker is using [employer].  Lab Results  Component Value Date   HGBA1C 6.0 (H) 08/14/2018   Lab Results  Component Value Date   INSULIN 22.3 05/01/2019   Vitamin D deficiency. No nausea, vomiting, or muscle weakness.    Ref. Range 05/01/2019 10:35  Vitamin D, 25-Hydroxy Latest Ref Range: 30.0 - 100.0 ng/mL 11.8 (L)   Assessment/Plan:   Prediabetes. Anna Walker will continue to work on weight loss, increasing activity as tolerated with no pounding exercises, increasing healthy fats and protein, and decreasing simple carbohydrates to help decrease the risk of diabetes.   Vitamin D deficiency. Low Vitamin D level contributes to fatigue and are associated with obesity, breast, and colon cancer. She was given a prescription for Vitamin D, Ergocalciferol, (DRISDOL) 1.25 MG (50000 UNIT) CAPS capsule every week #4 with 0 refills and will follow-up for  routine testing of Vitamin D, at least 2-3 times per year to avoid over-replacement.   Class 3 severe obesity with serious comorbidity and body mass index (BMI) of 40.0 to 44.9 in adult, unspecified obesity type (Anna Walker).  Anna Walker is currently in the action stage of change. As such, her goal is to continue with weight loss efforts. She has agreed to the Category 3 Plan alternating with the [place] + 300 calories.  She will work on meal planning, intentional eating, and cooking more at home.   Exercise goals: All adults should avoid inactivity. Some physical activity is better than none, and adults who participate in any amount of physical activity gain some health benefits.  Behavioral modification strategies: increasing lean protein intake, decreasing simple carbohydrates, increasing vegetables, increasing water intake, decreasing eating out, no skipping meals, meal planning and cooking strategies, keeping healthy foods in the home and planning for success.  Anna Walker has agreed to follow-up with our clinic in 2-3 weeks. She was informed of the importance of frequent follow-up visits to maximize her success with intensive lifestyle modifications for her multiple health conditions.   Objective:   Blood pressure 129/79, pulse 75, temperature 98.2 F (36.8 C), height 5\' 5"  (1.651 m), weight 262 lb (118.8 kg), SpO2 95 %. Body mass index is 43.6 kg/m.  General: Cooperative, alert, well developed, in no acute distress. HEENT: Conjunctivae and lids unremarkable. Cardiovascular: Regular rhythm.  Lungs: Normal work of breathing. Neurologic: No focal deficits. Pt is using a cane. She has soft cast on the left wrist  and one over her arm.  Lab Results  Component Value Date   CREATININE 0.85 01/27/2019   BUN 11 01/27/2019   NA 142 01/27/2019   K 4.0 01/27/2019   CL 107 01/27/2019   CO2 26 01/27/2019   Lab Results  Component Value Date   ALT 23 10/21/2018   AST 21 10/21/2018    ALKPHOS 80 10/21/2018   BILITOT 0.6 10/21/2018   Lab Results  Component Value Date   HGBA1C 6.0 (H) 08/14/2018   HGBA1C 5.9 (H) 02/09/2015   Lab Results  Component Value Date   INSULIN 22.3 05/01/2019   Lab Results  Component Value Date   TSH 1.507 02/09/2015   Lab Results  Component Value Date   CHOL 157 02/09/2015   HDL 34 (L) 02/09/2015   LDLCALC 72 02/09/2015   TRIG 256 (H) 02/09/2015   CHOLHDL 4.6 02/09/2015   Lab Results  Component Value Date   WBC 8.1 01/27/2019   HGB 13.0 01/27/2019   HCT 41.6 01/27/2019   MCV 88.5 01/27/2019   PLT 279 01/27/2019   No results found for: IRON, TIBC, FERRITIN  Obesity Behavioral Intervention Documentation for Insurance:   Approximately 15 minutes were spent on the discussion below.  ASK: We discussed the diagnosis of obesity with Anna Walker today and Anna Walker agreed to give [place] permission to discuss obesity behavioral modification therapy today.  ASSESS: Anna Walker has the diagnosis of obesity and her BMI today is 43.7. Anna Walker is in the action stage of change.   ADVISE: Anna Walker was educated on the multiple health risks of obesity as well as the benefit of weight loss to improve her health. She was advised of the need for long term treatment and the importance of lifestyle modifications to improve her current health and to decrease her risk of future health problems.  AGREE: Multiple dietary modification options and treatment options were discussed and Anna Walker agreed to follow the recommendations documented in the above note.  ARRANGE: Anna Walker was educated on the importance of frequent visits to treat obesity as outlined per CMS and USPSTF guidelines and agreed to schedule her next follow up appointment today.  Attestation Statements:   Reviewed by clinician on day of visit: allergies, medications, problem list, medical history, surgical history, family history, social history, and previous encounter notes.  Migdalia Dk, am acting as Location manager for [employer], DO   I have reviewed the above documentation for accuracy and completeness, and I agree with the above. Jearld Lesch, DO

## 2019-09-17 ENCOUNTER — Ambulatory Visit (INDEPENDENT_AMBULATORY_CARE_PROVIDER_SITE_OTHER): Payer: PPO | Admitting: Bariatrics

## 2019-09-17 ENCOUNTER — Other Ambulatory Visit (INDEPENDENT_AMBULATORY_CARE_PROVIDER_SITE_OTHER): Payer: Self-pay | Admitting: Bariatrics

## 2019-09-17 ENCOUNTER — Other Ambulatory Visit: Payer: Self-pay

## 2019-09-17 ENCOUNTER — Encounter (INDEPENDENT_AMBULATORY_CARE_PROVIDER_SITE_OTHER): Payer: Self-pay | Admitting: Bariatrics

## 2019-09-17 VITALS — BP 100/63 | HR 70 | Temp 98.5°F | Ht 65.0 in | Wt 260.0 lb

## 2019-09-17 DIAGNOSIS — Z6841 Body Mass Index (BMI) 40.0 and over, adult: Secondary | ICD-10-CM

## 2019-09-17 DIAGNOSIS — E559 Vitamin D deficiency, unspecified: Secondary | ICD-10-CM | POA: Diagnosis not present

## 2019-09-17 DIAGNOSIS — R7303 Prediabetes: Secondary | ICD-10-CM

## 2019-09-17 DIAGNOSIS — E7849 Other hyperlipidemia: Secondary | ICD-10-CM

## 2019-09-17 DIAGNOSIS — I10 Essential (primary) hypertension: Secondary | ICD-10-CM | POA: Diagnosis not present

## 2019-09-17 MED ORDER — VITAMIN D (ERGOCALCIFEROL) 1.25 MG (50000 UNIT) PO CAPS: 50000.0000 [IU] | ORAL_CAPSULE | ORAL | 0 refills | Status: DC

## 2019-09-17 NOTE — Progress Notes (Signed)
Chief Complaint:   OBESITY Anna Walker is here to discuss her progress with her obesity treatment plan along with follow-up of her obesity related diagnoses. Anna Walker is on the Category 3 Plan and states she is following her eating plan approximately 50% of the time. Anna Walker states she is exercising 0 minutes 0 times per week.  Today's visit was #: 7 Starting weight: 277 lbs Starting date: 05/01/2019 Today's weight: 260 lbs Today's date: 09/17/2019 Total lbs lost to date: 17 Total lbs lost since last in-office visit: 2  Interim History: Anna Walker is down 2 lbs and doing well overall.  Subjective:   Vitamin D deficiency. No nausea, vomiting, or muscle weakness.    Ref. Range 05/01/2019 10:35  Vitamin D, 25-Hydroxy Latest Ref Range: 30.0 - 100.0 ng/mL 11.8 (L)   Essential hypertension. Anna Walker is taking HCTZ.  BP Readings from Last 3 Encounters:  09/17/19 100/63  08/29/19 129/79  07/16/19 110/70   Lab Results  Component Value Date   CREATININE 0.85 01/27/2019   CREATININE 1.05 (H) 10/21/2018   CREATININE 0.94 10/18/2018   Other hyperlipidemia. Anna Walker is taking Zocor.   Lab Results  Component Value Date   CHOL 157 02/09/2015   HDL 34 (L) 02/09/2015   LDLCALC 72 02/09/2015   TRIG 256 (H) 02/09/2015   CHOLHDL 4.6 02/09/2015   Lab Results  Component Value Date   ALT 23 10/21/2018   AST 21 10/21/2018   ALKPHOS 80 10/21/2018   BILITOT 0.6 10/21/2018   The ASCVD Risk score Mikey Bussing DC Jr., et al., 2013) failed to calculate for the following reasons:   Cannot find a previous HDL lab   Cannot find a previous total cholesterol lab  Prediabetes. Anna Walker has a diagnosis of prediabetes based on her elevated HgA1c and was informed this puts her at greater risk of developing diabetes. She continues to work on diet and exercise to decrease her risk of diabetes. She denies nausea or hypoglycemia.  Lab Results  Component Value Date   HGBA1C 6.0 (H) 08/14/2018    Lab Results  Component Value Date   INSULIN 22.3 05/01/2019   Assessment/Plan:   Vitamin D deficiency. Low Vitamin D level contributes to fatigue and are associated with obesity, breast, and colon cancer. She was given a prescription for Vitamin D, Ergocalciferol, (DRISDOL) 1.25 MG (50000 UNIT) CAPS capsule every week #4 with 0 refills and VITAMIN D 25 Hydroxy (Vit-D Deficiency, Fractures) level will be checked today.  Essential hypertension. Anna Walker is working on healthy weight loss and exercise to improve blood pressure control. We will watch for signs of hypotension as she continues her lifestyle modifications. She will continue her medication as directed.   Other hyperlipidemia. Cardiovascular risk and specific lipid/LDL goals reviewed.  We discussed several lifestyle modifications today and Anna Walker will continue to work on diet, exercise and weight loss efforts. Orders and follow up as documented in patient record. She will continue her medication as directed.   Counseling Intensive lifestyle modifications are the first line treatment for this issue. . Dietary changes: Increase soluble fiber. Decrease simple carbohydrates. . Exercise changes: Moderate to vigorous-intensity aerobic activity 150 minutes per week if tolerated. . Lipid-lowering medications: see documented in medical record.   Prediabetes. Anna Walker will continue to work on weight loss, exercise, and decreasing simple carbohydrates to help decrease the risk of diabetes. Hemoglobin A1c, Insulin, random levels will be checked today.  Class 3 severe obesity with serious comorbidity and body mass index (BMI) of  40.0 to 44.9 in adult, unspecified obesity type (Anna Walker).  Anna Walker is currently in the action stage of change. As such, her goal is to continue with weight loss efforts. She has agreed to the Category 3 Plan.   She will work on meal planning, intentional eating, and increasing her protein intake. She will adhere  more closely to the plan.  Exercise goals: Anna Walker states she has been more active.  Behavioral modification strategies: increasing lean protein intake, decreasing simple carbohydrates, increasing vegetables, increasing water intake, decreasing eating out, no skipping meals, meal planning and cooking strategies, keeping healthy foods in the home and planning for success.  Anna Walker has agreed to follow-up with our clinic in 2-3 weeks. She was informed of the importance of frequent follow-up visits to maximize her success with intensive lifestyle modifications for her multiple health conditions.   Anna Walker was informed we would discuss her lab results at her next visit unless there is a critical issue that needs to be addressed sooner. Anna Walker agreed to keep her next visit at the agreed upon time to discuss these results.  Objective:   Blood pressure 100/63, pulse 70, temperature 98.5 F (36.9 C), temperature source Oral, height 5\' 5"  (1.651 m), weight 260 lb (117.9 kg), SpO2 96 %. Body mass index is 43.27 kg/m.  General: Cooperative, alert, well developed, in no acute distress. HEENT: Conjunctivae and lids unremarkable. Cardiovascular: Regular rhythm.  Lungs: Normal work of breathing. Neurologic: No focal deficits. Pt is walking with a cane for ambulation.  Lab Results  Component Value Date   CREATININE 0.85 01/27/2019   BUN 11 01/27/2019   NA 142 01/27/2019   K 4.0 01/27/2019   CL 107 01/27/2019   CO2 26 01/27/2019   Lab Results  Component Value Date   ALT 23 10/21/2018   AST 21 10/21/2018   ALKPHOS 80 10/21/2018   BILITOT 0.6 10/21/2018   Lab Results  Component Value Date   HGBA1C 6.0 (H) 08/14/2018   HGBA1C 5.9 (H) 02/09/2015   Lab Results  Component Value Date   INSULIN 22.3 05/01/2019   Lab Results  Component Value Date   TSH 1.507 02/09/2015   Lab Results  Component Value Date   CHOL 157 02/09/2015   HDL 34 (L) 02/09/2015   LDLCALC 72 02/09/2015    TRIG 256 (H) 02/09/2015   CHOLHDL 4.6 02/09/2015   Lab Results  Component Value Date   WBC 8.1 01/27/2019   HGB 13.0 01/27/2019   HCT 41.6 01/27/2019   MCV 88.5 01/27/2019   PLT 279 01/27/2019   No results found for: IRON, TIBC, FERRITIN  Obesity Behavioral Intervention Documentation for Insurance:   Approximately 15 minutes were spent on the discussion below.  ASK: We discussed the diagnosis of obesity with Anna Walker today and Jayanna agreed to give [place] permission to discuss obesity behavioral modification therapy today.  ASSESS: Anna Walker has the diagnosis of obesity and her BMI today is 43.4. Anna Walker is in the action stage of change.   ADVISE: Anna Walker was educated on the multiple health risks of obesity as well as the benefit of weight loss to improve her health. She was advised of the need for long term treatment and the importance of lifestyle modifications to improve her current health and to decrease her risk of future health problems.  AGREE: Multiple dietary modification options and treatment options were discussed and Anna Walker agreed to follow the recommendations documented in the above note.  ARRANGE: Anna Walker was educated on the importance of  frequent visits to treat obesity as outlined per CMS and USPSTF guidelines and agreed to schedule her next follow up appointment today.  Attestation Statements:   Reviewed by clinician on day of visit: allergies, medications, problem list, medical history, surgical history, family history, social history, and previous encounter notes.  Migdalia Dk, am acting as Location manager for [employer], DO   I have reviewed the above documentation for accuracy and completeness, and I agree with the above. Jearld Lesch, DO

## 2019-09-18 LAB — COMPREHENSIVE METABOLIC PANEL
ALT: 13 IU/L (ref 0–32)
AST: 15 IU/L (ref 0–40)
Albumin/Globulin Ratio: 1.7 (ref 1.2–2.2)
Albumin: 4.1 g/dL (ref 3.8–4.8)
Alkaline Phosphatase: 101 IU/L (ref 48–121)
BUN/Creatinine Ratio: 20 (ref 12–28)
BUN: 16 mg/dL (ref 8–27)
Bilirubin Total: 0.2 mg/dL (ref 0.0–1.2)
CO2: 25 mmol/L (ref 20–29)
Calcium: 8.7 mg/dL (ref 8.7–10.3)
Chloride: 103 mmol/L (ref 96–106)
Creatinine, Ser: 0.81 mg/dL (ref 0.57–1.00)
GFR calc Af Amer: 91 mL/min/{1.73_m2} (ref >59)
GFR calc non Af Amer: 79 mL/min/{1.73_m2} (ref >59)
Globulin, Total: 2.4 g/dL (ref 1.5–4.5)
Glucose: 102 mg/dL — ABNORMAL HIGH (ref 65–99)
Potassium: 4.3 mmol/L (ref 3.5–5.2)
Sodium: 143 mmol/L (ref 134–144)
Total Protein: 6.5 g/dL (ref 6.0–8.5)

## 2019-09-18 LAB — LIPID PANEL WITH LDL/HDL RATIO
Cholesterol, Total: 147 mg/dL (ref 100–199)
HDL: 48 mg/dL (ref >39)
LDL Chol Calc (NIH): 84 mg/dL (ref 0–99)
LDL/HDL Ratio: 1.8 ratio (ref 0.0–3.2)
Triglycerides: 76 mg/dL (ref 0–149)
VLDL Cholesterol Cal: 15 mg/dL (ref 5–40)

## 2019-09-18 LAB — HEMOGLOBIN A1C
Est. average glucose Bld gHb Est-mCnc: 120 mg/dL
Hgb A1c MFr Bld: 5.8 % — ABNORMAL HIGH (ref 4.8–5.6)

## 2019-09-18 LAB — VITAMIN D 25 HYDROXY (VIT D DEFICIENCY, FRACTURES): Vit D, 25-Hydroxy: 34.5 ng/mL (ref 30.0–100.0)

## 2019-09-18 LAB — INSULIN, RANDOM: INSULIN: 23.3 u[IU]/mL (ref 2.6–24.9)

## 2019-09-19 ENCOUNTER — Ambulatory Visit (INDEPENDENT_AMBULATORY_CARE_PROVIDER_SITE_OTHER): Payer: PPO | Admitting: Bariatrics

## 2019-09-25 NOTE — Progress Notes (Signed)
NEUROLOGY FOLLOW UP OFFICE NOTE  CAIDENCE KASEMAN [id number]  HISTORY OF PRESENT ILLNESS: Ziyan Hillmer is a 64 year old woman with ADD, fatigue, hypertension, restless leg syndrome, migraines, hyperlipidemia, type 2 diabetes mellitus, OSA, hypothyroidism and history of right optic neuritis who follows up for migraines.  UPDATE: Migraines are controlled.  They last several hours.  She has had 1 in past month.  She still has episodes of dizziness/spinning and feels shaky.  It often occurs spontaneously.  It occurs daily.  Often lasts several minutes but may last up to 30 minutes.  She feels dizzy with any quick movements.  Blood glucose is okay.    Restless leg is controlled.  Takes gabapentin 600mg  at bedtime and ropinirole 3mg  at dinner and at bedtime.  If needed, she takes 300mg  gabapentin earlier in the evening.  Current NSAIDS: ASA 81mg  daily Current analgesics:no Current triptans: sumatriptan 100mg  Current anti-emetic:no Current muscle relaxants:no Current anti-anxiolytic:no Current sleep aide:no Current Antihypertensive medications: HCTZ Current Antidepressant medications: fluoxetine 30mg  Current Anticonvulsant medications: gabapentin 300mg  evening PRN and 600mg  at bedtime Other medications: Ropinirole 3mg  dinner and 3mg  bedtime, amantadine 100mg  three times daily(fatigue)    HISTORY: I RIGHT OPTIC NEURITISAND OTHER RECURRENT SYMPTOMS WITH QUESTIONABLE MULTIPLE SCLEROSIS: She had an episode of right optic neuritis in 1998, presenting first as blurred vision and then complete vision loss.A couple of weeks later, she developed right sided numbness of the face, arm and leg.She underwent a lumbar puncture which demonstrated one oligoclonal band. MRI of brain reportedly may have shown foci which may be concerning for MS. MRI cervical spine reportedly did not demonstrate demyelinating lesions. She was diagnosed with multiple sclerosis and treatedwith IV  steroids. The vision loss lasted 7-8 weeks. The numbness lasted several days. She was treated with DMT for several years. She started on Betaseron but was then switched to Copaxone and then Avonex due to intolerability. She had a repeat LP in 2002 which showed no oligoclonal bands and normal IgG index of 0.6. Visual evoked potential in 2013 showed absent right response and normal left response. Repeat MRI from July 2014 was thought to be essentially normal so Avonex was discontinued. Repeat MRI in November 2014 was unchanged. Therefore, it was believed that she did not have MS. NMO-IgG antibodies from 02/24/14 were negative.MRI of cervical spine without contrast from 03/24/16 was personally reviewed and revealed cervical spondylosis but no abnormal cord signal.  Since the episode in 1998, she endorses subtle weakness on the right side of her body. For example, when she starts to write, her handwriting will start to get worse. She also reports short-term memory deficits since 1998. Over the years, she has had recurrent episodes of right eye pain with right sided numbness. In the past, they were thought to be MS flares which were treated with steroids. They occur when she is heated or fatigued. They typically occur at least once a year and they last about 2 days. She last had an episode in early January 2019, for which she was evaluated in the ED on 01/30/17. She had an MRI of the brain and orbits with and without contrast, which was personally reviewed and demonstrated asymmetrically small right optic nerve compatible with sequelae of prior optic neuritis but no enhancement, as well as three nonspecific tiny hyperintense T2/FLAIR foci in the bifrontal periventricular white matter.All of her symptoms have not progressed over the years and recurrence of these episodes have not increased since discontinuation of Avonex.  She was seen in the  ED on 08/27/18 with1 day ofbilateral blurred vision, of  gradual onset. It was monocular in either eye. She first noticed it because she had trouble driving. She had some right eye ache and dizziness but no headache or new numbness and tinglingMRI of brain/orbits and cervical spine with and without contrast appeared stable and showed no active demyelinating lesions or abnormal signal of optic nerves. She was diagnosed by in-house neurology with migraine and was treated with migraine cocktail of toradol and Reglan. She doesn't have vertigo but she feels "insecure" and problems with balance when ambulating. Blurred vision was unchanged. She followed up with her ophthalmologist, Dr. Gershon Crane.  Eye exam was unremarkable except for early cataracts.   She has pain in the legs and gait issues. She does have arthritis in the knees, which required surgery and had a subsequent DVT. She is treated for restless leg syndrome, for which she takes ropinirole and gabapentin.  She has chronic burning in the hands.  She also reports chronic fatigue. She has OSA and is on CPAP. She takes amantadine.  II MIGRAINES She has history of migraines since early adulthood. They are severe bifrontal pain associated with nausea, photophobia and phonophobia but no vomiting, new visual disturbance or unilateral numbness or weakness. They last 1 to 2 days and usually occur once a year.There are no specific triggers or relieving factors however eye gets more blurred when fatigued or with drop in blood sugar.  She also reports another migraine described assevere sharpright retro-orbitalpain in September 2019. One one occasion, pain radiated into right ear and posterior neck.  Constant but fluctuates. There is associated blurred vision in right eye, dizziness,nausea, photophobia and phonophobia, eye twitching and fatigue. No associated right sided numbness and weakness.   Past medication: topiramate  PAST MEDICAL HISTORY: Past Medical History:  Diagnosis Date    . ADD (attention deficit disorder)   . Anxiety   . Biliary colic   . Bladder leak   . Diabetes mellitus without complication (HCC)    type 2 , diet controlled   . Diverticular disease   . DVT (deep venous thrombosis) (HCC)    left leg , resolved after treatment of blood thinners   . Edema of both lower extremities   . GERD (gastroesophageal reflux disease)    causes coughing   . Glaucoma   . High cholesterol   . Hypertension   . Hypothyroid   . Leg cramps    "in my thighs"   . Leg cramps   . Migraines   . Multiple sclerosis ([place])    per patient " that ha sbeen ruled out , they though it was that initially but it did not preogress like it"  . Optic neuritis    lesion on right optic nerve; right eye only , reduced vision and blurriness , loss of depth perception, glasses do not help when it flares    . OSA (obstructive sleep apnea)    nightly cpap   . Paresthesias/numbness    fingers and toes   . Pneumonia 2017  . Restless leg syndrome   . Sleep apnea   . SOB (shortness of breath)   . Thyroid disease    Hypothyroidism    MEDICATIONS: Current Outpatient Medications on File Prior to Visit  Medication Sig Dispense Refill  . acetaminophen (TYLENOL) 500 MG tablet Take 500 mg by mouth every 6 (six) hours as needed.    Marland Kitchen amantadine (SYMMETREL) 100 MG capsule TAKE 1 CAPSULE(100 MG) BY  MOUTH THREE TIMES DAILY (Patient taking differently: Take 100-200 mg by mouth See admin instructions. TAKE 2 CAPSULES (200 MG) BY MOUTH SCHEDULED EVERY MORNING & MAY TAKE 1 CAPSULE (100 MG) BY MOUTH IN THE AFTERNOON AS NEEDED FOR MULTIPLE SCLEROSIS SYMPTOMS) 270 capsule 3  . ASPERCREME LIDOCAINE EX Apply topically.    Marland Kitchen aspirin (ASPIRIN EC) 81 MG EC tablet Take 81 mg by mouth daily. Swallow whole.    Marland Kitchen FLUoxetine (PROZAC) 10 MG capsule Take 10 mg by mouth at bedtime. Take with 20 mg dose    . FLUoxetine (PROZAC) 20 MG capsule Take 20 mg by mouth at bedtime. Take with 10 mg dose    . gabapentin  (NEURONTIN) 300 MG capsule TAKE 1 CAPSULE BY MOUTH AT DINNER AND 2 CAPSULES AT BEDTIME 270 capsule 4  . hydrochlorothiazide (HYDRODIURIL) 12.5 MG tablet Take 12.5 mg by mouth daily.     Marland Kitchen levothyroxine (SYNTHROID, LEVOTHROID) 150 MCG tablet Take 150 mcg by mouth daily before breakfast.    . omeprazole (PRILOSEC) 40 MG capsule Take 1 capsule (40 mg total) by mouth 2 (two) times daily before a meal. 60 capsule 1  . ondansetron (ZOFRAN) 4 MG tablet Take 4 mg by mouth every 8 (eight) hours as needed for nausea or vomiting.    Marland Kitchen oxybutynin (DITROPAN) 5 MG tablet Take 5 mg by mouth at bedtime.     Marland Kitchen rOPINIRole (REQUIP) 3 MG tablet TAKE 1 TABLET BY MOUTH DAILY AT DINNER AND 1 TABLET AT BEDTIME. PLEASE CALL [phone] TO SCHEDULE AN APPOINTMENT 180 tablet 0  . simvastatin (ZOCOR) 10 MG tablet Take 10 mg by mouth every evening.     . SUMAtriptan (IMITREX) 100 MG tablet Take 1 tablet (100 mg total) by mouth once as needed for migraine. May repeat in 2 hours if headache persists or recurs. 10 tablet 5  . Vitamin D, Ergocalciferol, (DRISDOL) 1.25 MG (50000 UNIT) CAPS capsule Take 1 capsule (50,000 Units total) by mouth every 7 (seven) days. 4 capsule 0   No current facility-administered medications on file prior to visit.    ALLERGIES: Allergies  Allergen Reactions  . Adderall [Amphetamine-Dextroamphetamine] Other (See Comments)    Caused elevated BP  . Topamax [Topiramate]     dizzy    FAMILY HISTORY: Family History  Problem Relation Age of Onset  . Barrett's esophagus Mother   . Hypertension Mother   . Obesity Mother   . Graves' disease Sister   . Rheum arthritis Sister   . Heart attack Father 42  . Sudden death Father   . Coronary artery disease Neg Hx    SOCIAL HISTORY: Social History   Socioeconomic History  . Marital status: Married    Spouse name: Eddie Dibbles  . Number of children: 2  . Years of education: Not on file  . Highest education level: Some college, no degree  Occupational  History  . Occupation: disabled  Tobacco Use  . Smoking status: Former Smoker    Quit date: 01/25/1984    Years since quitting: 35.6  . Smokeless tobacco: Never Used  Vaping Use  . Vaping Use: Never used  Substance and Sexual Activity  . Alcohol use: Not Currently  . Drug use: No  . Sexual activity: Yes    Birth control/protection: Surgical  Other Topics Concern  . Not on file  Social History Narrative   Pt is right handed. She is married, lives with her husband in a 1 story house, ramp to enter. She drinks  2 cups of coffee a day.    Social Determinants of Health   Financial Resource Strain:   . Difficulty of Paying Living Expenses: Not on file  Food Insecurity:   . Worried About Charity fundraiser in the Last Year: Not on file  . Ran Out of Food in the Last Year: Not on file  Transportation Needs:   . Lack of Transportation (Medical): Not on file  . Lack of Transportation (Non-Medical): Not on file  Physical Activity:   . Days of Exercise per Week: Not on file  . Minutes of Exercise per Session: Not on file  Stress:   . Feeling of Stress : Not on file  Social Connections:   . Frequency of Communication with Friends and Family: Not on file  . Frequency of Social Gatherings with Friends and Family: Not on file  . Attends Religious Services: Not on file  . Active Member of Clubs or Organizations: Not on file  . Attends Archivist Meetings: Not on file  . Marital Status: Not on file  Intimate Partner Violence:   . Fear of Current or Ex-Partner: Not on file  . Emotionally Abused: Not on file  . Physically Abused: Not on file  . Sexually Abused: Not on file    PHYSICAL EXAM: Blood pressure 117/76, pulse 78, height 5\' 6"  (1.676 m), weight 261 lb (118.4 kg), SpO2 98 %. General: No acute distress.  Patient appears well-groomed.   Head:  Normocephalic/atraumatic Eyes:  Fundi examined but not visualized Neck: supple, no paraspinal tenderness, full range of  motion Heart:  Regular rate and rhythm Lungs:  Clear to auscultation bilaterally Back: No paraspinal tenderness Neurological Exam: alert and oriented to person, place, and time. Attention span and concentration intact, recent and remote memory intact, fund of knowledge intact.  Speech fluent and not dysarthric, language intact.  CN II-XII intact. Bulk and tone normal, muscle strength 5/5 throughout.  Sensation to light touch, temperature and vibration intact.  Deep tendon reflexes 2+ throughout, toes downgoing.  Finger to nose and heel to shin testing intact.  Gait normal, Romberg negative.  IMPRESSION: 1.  Migraine with aura, without status migrainosus, not intractable.   2.  Recurrent symptoms of subjective right-sided heaviness/numbness, likely complicated migraine. Based on testing that has been performed, I do not believe Ms. Mordan has multiple sclerosis. CSF analysis revealed 1 oligoclonal band, which is not a significant number. She has had repeated MRIs over the years with minimal (3 ) tiny hyperintense foci in the periventricular white matter which have been stable. Given her longstanding history with recurrent flare-ups, I would expect radiographic evidence of disease progression on MRI. She also does not exhibit abnormal findings in other regions of the CNS, such as supratentorial region or spinal cord. 3.  Restless leg syndrome, stable   PLAN: 1.  For preventative management, gabapentin 600mg  at bedtime 2.  For abortive therapy, sumatriptan 3. For restless leg:  Ropinirole 3mg  at dinner and 3mg  at bedtime; gabapentin 600mg  at bedtime (300mg  in evening if needed). 4.  Limit use of pain relievers to no more than 2 days out of week to prevent risk of rebound or medication-overuse headache. 5.  Keep headache diary 6.  Exercise, hydration, caffeine cessation, sleep hygiene, monitor for and avoid triggers 7.  Consider:  magnesium citrate 400mg  daily, riboflavin 400mg  daily, and  coenzyme Q10 100mg  three times daily 8. Always keep in mind that currently taking a hormone or birth control may  be a possible trigger or aggravating factor for migraine. 9. Follow up 6 months   Metta Clines, DO  CC: Kelton Pillar, MD

## 2019-09-26 ENCOUNTER — Ambulatory Visit (INDEPENDENT_AMBULATORY_CARE_PROVIDER_SITE_OTHER): Payer: PPO | Admitting: Neurology

## 2019-09-26 ENCOUNTER — Encounter: Payer: Self-pay | Admitting: Neurology

## 2019-09-26 ENCOUNTER — Other Ambulatory Visit: Payer: Self-pay

## 2019-09-26 VITALS — BP 117/76 | HR 78 | Ht 66.0 in | Wt 261.0 lb

## 2019-09-26 DIAGNOSIS — G43109 Migraine with aura, not intractable, without status migrainosus: Secondary | ICD-10-CM

## 2019-09-26 DIAGNOSIS — R42 Dizziness and giddiness: Secondary | ICD-10-CM | POA: Diagnosis not present

## 2019-09-26 DIAGNOSIS — Z8669 Personal history of other diseases of the nervous system and sense organs: Secondary | ICD-10-CM | POA: Diagnosis not present

## 2019-09-26 DIAGNOSIS — G2581 Restless legs syndrome: Secondary | ICD-10-CM | POA: Diagnosis not present

## 2019-09-26 NOTE — Patient Instructions (Signed)
No change in medication at this time.

## 2019-10-07 ENCOUNTER — Ambulatory Visit (INDEPENDENT_AMBULATORY_CARE_PROVIDER_SITE_OTHER): Payer: PPO | Admitting: Bariatrics

## 2019-10-07 ENCOUNTER — Encounter (INDEPENDENT_AMBULATORY_CARE_PROVIDER_SITE_OTHER): Payer: Self-pay | Admitting: Bariatrics

## 2019-10-07 ENCOUNTER — Other Ambulatory Visit: Payer: Self-pay

## 2019-10-07 ENCOUNTER — Other Ambulatory Visit (INDEPENDENT_AMBULATORY_CARE_PROVIDER_SITE_OTHER): Payer: Self-pay | Admitting: Bariatrics

## 2019-10-07 VITALS — BP 107/74 | HR 70 | Temp 98.1°F | Ht 66.0 in | Wt 258.0 lb

## 2019-10-07 DIAGNOSIS — I10 Essential (primary) hypertension: Secondary | ICD-10-CM

## 2019-10-07 DIAGNOSIS — E559 Vitamin D deficiency, unspecified: Secondary | ICD-10-CM

## 2019-10-07 DIAGNOSIS — Z6841 Body Mass Index (BMI) 40.0 and over, adult: Secondary | ICD-10-CM | POA: Diagnosis not present

## 2019-10-07 DIAGNOSIS — F5089 Other specified eating disorder: Secondary | ICD-10-CM

## 2019-10-07 MED ORDER — VITAMIN D (ERGOCALCIFEROL) 1.25 MG (50000 UNIT) PO CAPS: 50000.0000 [IU] | ORAL_CAPSULE | ORAL | 0 refills | Status: DC

## 2019-10-09 NOTE — Progress Notes (Signed)
Chief Complaint:   OBESITY Anna Walker is here to discuss her progress with her obesity treatment plan along with follow-up of her obesity related diagnoses. Anna Walker is on the Category 3 Plan and states she is following her eating plan approximately 50% of the time. Anna Walker states she is getting in 5,000 steps 4-5 times per week.  Today's visit was #: 8 Starting weight: 277 lbs Starting date: 05/01/2019 Today's weight: 258 lbs Today's date: 10/07/2019 Total lbs lost to date: 19 lbs Total lbs lost since last in-office visit: 2 lbs  Interim History: Anna Walker is down an additional 2 pounds and doing well overall.  Subjective:   1. Vitamin D deficiency Anna Walker's Vitamin D level was 34.5 on 09/17/2019. She is currently taking prescription vitamin D 50,000 IU each week. She denies nausea, vomiting or muscle weakness.  She says she gets minimal sunlight exposure.  2. Essential hypertension Review: taking medications as instructed, no medication side effects noted, no chest pain on exertion, no dyspnea on exertion, no swelling of ankles.  She is taking HCTZ.  BP Readings from Last 3 Encounters:  10/07/19 107/74  09/26/19 117/76  09/17/19 100/63   3. Other disorder of eating She says she tries to keep her hands busy.  Assessment/Plan:   1. Vitamin D deficiency Low Vitamin D level contributes to fatigue and are associated with obesity, breast, and colon cancer. She agrees to continue to take prescription Vitamin D @50 ,000 IU every week and will follow-up for routine testing of Vitamin D, at least 2-3 times per year to avoid over-replacement.  -Refill Vitamin D, Ergocalciferol, (DRISDOL) 1.25 MG (50000 UNIT) CAPS capsule; Take 1 capsule (50,000 Units total) by mouth every 7 (seven) days.  Dispense: 4 capsule; Refill: 0  2. Essential hypertension Anna Walker is working on healthy weight loss and exercise to improve blood pressure control. We will watch for signs of hypotension as  she continues her lifestyle modifications.  3. Other disorder of eating Will continue to keep her hands busy.  Discussed CBT techniques.   4. Class 3 severe obesity with serious comorbidity and body mass index (BMI) of 40.0 to 44.9 in adult, unspecified obesity type [facility]) Anna Walker is currently in the action stage of change. As such, her goal is to continue with weight loss efforts. She has agreed to the Category 3 Plan.   Anna Walker will work on meal planning, intentional eating, and will be more adherent to the plan.  Exercise goals: As is.  Behavioral modification strategies: increasing lean protein intake, decreasing simple carbohydrates, increasing vegetables, increasing water intake, decreasing eating out, no skipping meals, meal planning and cooking strategies, keeping healthy foods in the home and planning for success.  Anna Walker has agreed to follow-up with our clinic in 2 weeks. She was informed of the importance of frequent follow-up visits to maximize her success with intensive lifestyle modifications for her multiple health conditions.   Objective:   Blood pressure 107/74, pulse 70, temperature 98.1 F (36.7 C), height 5\' 6"  (1.676 m), weight 258 lb (117 kg), SpO2 95 %. Body mass index is 41.64 kg/m.  General: Cooperative, alert, well developed, in no acute distress. HEENT: Conjunctivae and lids unremarkable. Cardiovascular: Regular rhythm.  Lungs: Normal work of breathing. Neurologic: No focal deficits.   Lab Results  Component Value Date   CREATININE 0.81 09/17/2019   BUN 16 09/17/2019   NA 143 09/17/2019   K 4.3 09/17/2019   CL 103 09/17/2019   CO2 25 09/17/2019  Lab Results  Component Value Date   ALT 13 09/17/2019   AST 15 09/17/2019   ALKPHOS 101 09/17/2019   BILITOT 0.2 09/17/2019   Lab Results  Component Value Date   HGBA1C 5.8 (H) 09/17/2019   HGBA1C 6.0 (H) 08/14/2018   HGBA1C 5.9 (H) 02/09/2015   Lab Results  Component Value Date   INSULIN  23.3 09/17/2019   INSULIN 22.3 05/01/2019   Lab Results  Component Value Date   TSH 1.507 02/09/2015   Lab Results  Component Value Date   CHOL 147 09/17/2019   HDL 48 09/17/2019   LDLCALC 84 09/17/2019   TRIG 76 09/17/2019   CHOLHDL 4.6 02/09/2015   Lab Results  Component Value Date   WBC 8.1 01/27/2019   HGB 13.0 01/27/2019   HCT 41.6 01/27/2019   MCV 88.5 01/27/2019   PLT 279 01/27/2019   Obesity Behavioral Intervention:   Approximately 15 minutes were spent on the discussion below.  ASK: We discussed the diagnosis of obesity with Anna Walker today and Anna Walker agreed to give [place] permission to discuss obesity behavioral modification therapy today.  ASSESS: Anna Walker has the diagnosis of obesity and her BMI today is 42.9. Anna Walker is in the action stage of change.   ADVISE: Anna Walker was educated on the multiple health risks of obesity as well as the benefit of weight loss to improve her health. She was advised of the need for long term treatment and the importance of lifestyle modifications to improve her current health and to decrease her risk of future health problems.  AGREE: Multiple dietary modification options and treatment options were discussed and Anna Walker agreed to follow the recommendations documented in the above note.  ARRANGE: Anna Walker was educated on the importance of frequent visits to treat obesity as outlined per CMS and USPSTF guidelines and agreed to schedule her next follow up appointment today.  Attestation Statements:   Reviewed by clinician on day of visit: allergies, medications, problem list, medical history, surgical history, family history, social history, and previous encounter notes.  I, Water quality scientist, CMA, am acting as Location manager for [employer], DO  I have reviewed the above documentation for accuracy and completeness, and I agree with the above. Jearld Lesch, DO

## 2019-10-10 ENCOUNTER — Encounter (INDEPENDENT_AMBULATORY_CARE_PROVIDER_SITE_OTHER): Payer: Self-pay | Admitting: Bariatrics

## 2019-10-24 ENCOUNTER — Ambulatory Visit (INDEPENDENT_AMBULATORY_CARE_PROVIDER_SITE_OTHER): Payer: PPO | Admitting: Bariatrics

## 2019-10-24 ENCOUNTER — Encounter (INDEPENDENT_AMBULATORY_CARE_PROVIDER_SITE_OTHER): Payer: Self-pay | Admitting: Bariatrics

## 2019-10-24 ENCOUNTER — Other Ambulatory Visit (INDEPENDENT_AMBULATORY_CARE_PROVIDER_SITE_OTHER): Payer: Self-pay | Admitting: Bariatrics

## 2019-10-24 ENCOUNTER — Other Ambulatory Visit: Payer: Self-pay

## 2019-10-24 VITALS — BP 130/75 | HR 76 | Temp 98.4°F | Ht 65.0 in | Wt 261.0 lb

## 2019-10-24 DIAGNOSIS — E559 Vitamin D deficiency, unspecified: Secondary | ICD-10-CM

## 2019-10-24 DIAGNOSIS — Z6841 Body Mass Index (BMI) 40.0 and over, adult: Secondary | ICD-10-CM

## 2019-10-24 DIAGNOSIS — K5909 Other constipation: Secondary | ICD-10-CM | POA: Diagnosis not present

## 2019-10-24 DIAGNOSIS — I10 Essential (primary) hypertension: Secondary | ICD-10-CM

## 2019-10-24 DIAGNOSIS — E7849 Other hyperlipidemia: Secondary | ICD-10-CM

## 2019-10-24 MED ORDER — VITAMIN D (ERGOCALCIFEROL) 1.25 MG (50000 UNIT) PO CAPS: 50000.0000 [IU] | ORAL_CAPSULE | ORAL | 0 refills | Status: DC

## 2019-10-24 NOTE — Progress Notes (Signed)
Chief Complaint:   OBESITY Anna Walker is here to discuss her progress with her obesity treatment plan along with follow-up of her obesity related diagnoses. Anna Walker is on the Category 3 Plan and states she is following her eating plan approximately 60% of the time. Anna Walker states she is walking 5,000 steps 5 times per week.  Today's visit was #: 9 Starting weight: 277 lbs Starting date: 05/01/2019 Today's weight: 261 lbs Today's date: 10/24/2019 Total lbs lost to date: 16 Total lbs lost since last in-office visit: 0  Interim History: Anna Walker is up lbs but she is doing well overall. She has not been as adherent to the plan. Her bioimpedance machine is showing more water.  Subjective:   1. Vitamin D deficiency Anna Walker is on Vit D, and she denies nausea, vomiting, or muscle weakness.  2. Essential hypertension Anna Walker's blood pressures is 130/75 today. She is on hydrochlorothiazide.  3. Other constipation Anna Walker notes constipation related to protein, and she stopped the activity.  4. Other hyperlipidemia Anna Walker is taking Zocor, and she denies side effects.  Assessment/Plan:   1. Vitamin D deficiency Low Vitamin D level contributes to fatigue and are associated with obesity, breast, and colon cancer. We will refill prescription Vitamin D for 1 month. Anna Walker will follow-up for routine testing of Vitamin D, at least 2-3 times per year to avoid over-replacement.  - Vitamin D, Ergocalciferol, (DRISDOL) 1.25 MG (50000 UNIT) CAPS capsule; Take 1 capsule (50,000 Units total) by mouth every 7 (seven) days.  Dispense: 4 capsule; Refill: 0  2. Essential hypertension Anna Walker will continue her medications, and will continue working on healthy weight loss and exercise to improve blood pressure control. We will watch for signs of hypotension as she continues her lifestyle modifications.  3. Other constipation Anna Walker was informed that a decrease in bowel movement  frequency is normal while losing weight, but stools should not be hard or painful. Anna Walker is to increase her water intake, try flaxseed, wheat, miralax OTC. Orders and follow up as documented in patient record.   Counseling Getting to Good Bowel Health: Your goal is to have one soft bowel movement each day. Drink at least 8 glasses of water each day. Eat plenty of fiber (goal is over 25 grams each day). It is best to get most of your fiber from dietary sources which includes leafy green vegetables, fresh fruit, and whole grains. You may need to add fiber with the help of OTC fiber supplements. These include Metamucil, Citrucel, and Flaxseed. If you are still having trouble, try adding Miralax or Magnesium Citrate. If all of these changes do not work, Cabin crew.  4. Other hyperlipidemia Cardiovascular risk and specific lipid/LDL goals reviewed. We discussed several lifestyle modifications today. Anna Walker will continue her medications, and will continue to work on diet, exercise and weight loss efforts. Orders and follow up as documented in patient record.   Counseling Intensive lifestyle modifications are the first line treatment for this issue. . Dietary changes: Increase soluble fiber. Decrease simple carbohydrates. . Exercise changes: Moderate to vigorous-intensity aerobic activity 150 minutes per week if tolerated. . Lipid-lowering medications: see documented in medical record.  5. Class 3 severe obesity with serious comorbidity and body mass index (BMI) of 40.0 to 44.9 in adult, unspecified obesity type Anna Walker is currently in the action stage of change. As such, her goal is to continue with weight loss efforts. She has agreed to the Category 3 Plan.   Anna Walker is to  be more adherent to the meal plan.  Exercise goals: As is, as she has been more active with walking with a cane.  Behavioral modification strategies: increasing lean protein intake, decreasing simple  carbohydrates, increasing vegetables, increasing water intake, decreasing eating out, no skipping meals, meal planning and cooking strategies, keeping healthy foods in the home and planning for success.  Anna Walker has agreed to follow-up with our clinic in 3 weeks. She was informed of the importance of frequent follow-up visits to maximize her success with intensive lifestyle modifications for her multiple health conditions.   Objective:   Blood pressure 130/75, pulse 76, temperature 98.4 F (36.9 C), height 5\' 5"  (1.651 Walker), weight 261 lb (118.4 kg), SpO2 97 %. Body mass index is 43.43 kg/Walker.  General: Cooperative, alert, well developed, in no acute distress. HEENT: Conjunctivae and lids unremarkable. Cardiovascular: Regular rhythm.  Lungs: Normal work of breathing. Neurologic: No focal deficits.   Lab Results  Component Value Date   CREATININE 0.81 09/17/2019   BUN 16 09/17/2019   NA 143 09/17/2019   K 4.3 09/17/2019   CL 103 09/17/2019   CO2 25 09/17/2019   Lab Results  Component Value Date   ALT 13 09/17/2019   AST 15 09/17/2019   ALKPHOS 101 09/17/2019   BILITOT 0.2 09/17/2019   Lab Results  Component Value Date   HGBA1C 5.8 (H) 09/17/2019   HGBA1C 6.0 (H) 08/14/2018   HGBA1C 5.9 (H) 02/09/2015   Lab Results  Component Value Date   INSULIN 23.3 09/17/2019   INSULIN 22.3 05/01/2019   Lab Results  Component Value Date   TSH 1.507 02/09/2015   Lab Results  Component Value Date   CHOL 147 09/17/2019   HDL 48 09/17/2019   LDLCALC 84 09/17/2019   TRIG 76 09/17/2019   CHOLHDL 4.6 02/09/2015   Lab Results  Component Value Date   WBC 8.1 01/27/2019   HGB 13.0 01/27/2019   HCT 41.6 01/27/2019   MCV 88.5 01/27/2019   PLT 279 01/27/2019   No results found for: IRON, TIBC, FERRITIN  Obesity Behavioral Intervention:   Approximately 15 minutes were spent on the discussion below.  ASK: We discussed the diagnosis of obesity with Anna Walker today and Anna Walker  agreed to give [place] permission to discuss obesity behavioral modification therapy today.  ASSESS: Anna Walker has the diagnosis of obesity and her BMI today is 43.43. Anna Walker is in the action stage of change.   ADVISE: Anna Walker was educated on the multiple health risks of obesity as well as the benefit of weight loss to improve her health. She was advised of the need for long term treatment and the importance of lifestyle modifications to improve her current health and to decrease her risk of future health problems.  AGREE: Multiple dietary modification options and treatment options were discussed and Yajayra agreed to follow the recommendations documented in the above note.  ARRANGE: Morgaine was educated on the importance of frequent visits to treat obesity as outlined per CMS and USPSTF guidelines and agreed to schedule her next follow up appointment today.  Attestation Statements:   Reviewed by clinician on day of visit: allergies, medications, problem list, medical history, surgical history, family history, social history, and previous encounter notes.   Wilhemena Durie, am acting as Location manager for [employer], DO.  I have reviewed the above documentation for accuracy and completeness, and I agree with the above. Jearld Lesch, DO

## 2019-11-14 ENCOUNTER — Encounter (INDEPENDENT_AMBULATORY_CARE_PROVIDER_SITE_OTHER): Payer: Self-pay | Admitting: Bariatrics

## 2019-11-14 ENCOUNTER — Ambulatory Visit (INDEPENDENT_AMBULATORY_CARE_PROVIDER_SITE_OTHER): Payer: PPO | Admitting: Bariatrics

## 2019-11-14 ENCOUNTER — Other Ambulatory Visit: Payer: Self-pay

## 2019-11-14 VITALS — BP 124/85 | HR 67 | Temp 97.7°F | Ht 65.0 in | Wt 262.0 lb

## 2019-11-14 DIAGNOSIS — Z6841 Body Mass Index (BMI) 40.0 and over, adult: Secondary | ICD-10-CM | POA: Diagnosis not present

## 2019-11-14 DIAGNOSIS — E7849 Other hyperlipidemia: Secondary | ICD-10-CM

## 2019-11-14 DIAGNOSIS — I10 Essential (primary) hypertension: Secondary | ICD-10-CM | POA: Diagnosis not present

## 2019-11-18 DIAGNOSIS — G43909 Migraine, unspecified, not intractable, without status migrainosus: Secondary | ICD-10-CM | POA: Diagnosis not present

## 2019-11-18 DIAGNOSIS — E039 Hypothyroidism, unspecified: Secondary | ICD-10-CM | POA: Diagnosis not present

## 2019-11-18 DIAGNOSIS — E1169 Type 2 diabetes mellitus with other specified complication: Secondary | ICD-10-CM | POA: Diagnosis not present

## 2019-11-18 DIAGNOSIS — E785 Hyperlipidemia, unspecified: Secondary | ICD-10-CM | POA: Diagnosis not present

## 2019-11-18 DIAGNOSIS — Z23 Encounter for immunization: Secondary | ICD-10-CM | POA: Diagnosis not present

## 2019-11-18 DIAGNOSIS — Z0000 Encounter for general adult medical examination without abnormal findings: Secondary | ICD-10-CM | POA: Diagnosis not present

## 2019-11-18 DIAGNOSIS — F39 Unspecified mood [affective] disorder: Secondary | ICD-10-CM | POA: Diagnosis not present

## 2019-11-18 DIAGNOSIS — G4733 Obstructive sleep apnea (adult) (pediatric): Secondary | ICD-10-CM | POA: Diagnosis not present

## 2019-11-18 DIAGNOSIS — G2581 Restless legs syndrome: Secondary | ICD-10-CM | POA: Diagnosis not present

## 2019-11-18 DIAGNOSIS — I10 Essential (primary) hypertension: Secondary | ICD-10-CM | POA: Diagnosis not present

## 2019-11-18 DIAGNOSIS — E559 Vitamin D deficiency, unspecified: Secondary | ICD-10-CM | POA: Diagnosis not present

## 2019-11-18 DIAGNOSIS — G35 Multiple sclerosis: Secondary | ICD-10-CM | POA: Diagnosis not present

## 2019-11-18 NOTE — Progress Notes (Signed)
Chief Complaint:   OBESITY Anna Walker is here to discuss her progress with her obesity treatment plan along with follow-up of her obesity related diagnoses. Anna Walker is on the Category 3 Plan and states she is following her eating plan approximately 50% of the time. Anna Walker states she is walking 5,000 steps 5 times per week.  Today's visit was #: 10 Starting weight: 277 lbs Starting date: 05/01/2019 Today's weight: 262 lbs Today's date: 11/14/2019 Total lbs lost to date: 15 Total lbs lost since last in-office visit: 0  Interim History: Anna Walker is up 1 lb since her last visit. She has struggled with the plan. She reports doing well with her water intake.  Subjective:   Essential hypertension. Anna Walker is taking HCTZ.  BP Readings from Last 3 Encounters:  11/14/19 124/85  10/24/19 130/75  10/07/19 107/74   Lab Results  Component Value Date   CREATININE 0.81 09/17/2019   CREATININE 0.85 01/27/2019   CREATININE 1.05 (H) 10/21/2018   Other hyperlipidemia. Anna Walker is taking Zocor.   Lab Results  Component Value Date   CHOL 147 09/17/2019   HDL 48 09/17/2019   LDLCALC 84 09/17/2019   TRIG 76 09/17/2019   CHOLHDL 4.6 02/09/2015   Lab Results  Component Value Date   ALT 13 09/17/2019   AST 15 09/17/2019   ALKPHOS 101 09/17/2019   BILITOT 0.2 09/17/2019   The 10-year ASCVD risk score Anna Walker Anna Jr., Anna al., Anna Walker) is: 8.5%   Values used to calculate the score:     Age: 64 years     Sex: Female     Is Non-Hispanic African American: No     Diabetic: Yes     Tobacco smoker: No     Systolic Blood Pressure: 458 mmHg     Is BP treated: Yes     HDL Cholesterol: 48 mg/dL     Total Cholesterol: 147 mg/dL  Assessment/Plan:   Essential hypertension. Anna Walker is working on healthy weight loss and exercise to improve blood pressure control. We will watch for signs of hypotension as she continues her lifestyle modifications. She will continue her medication as  directed.   Other hyperlipidemia. Cardiovascular risk and specific lipid/LDL goals reviewed.  We discussed several lifestyle modifications today and Anna Walker will continue to work on diet, exercise and weight loss efforts. Orders and follow up as documented in patient record. She will continue Zocor as directed.   Counseling Intensive lifestyle modifications are the first line treatment for this issue. . Dietary changes: Increase soluble fiber. Decrease simple carbohydrates. . Exercise changes: Moderate to vigorous-intensity aerobic activity 150 minutes per week if tolerated. . Lipid-lowering medications: see documented in medical record.  Class 3 severe obesity with serious comorbidity and body mass index (BMI) of 40.0 to 44.9 in adult, unspecified obesity type (Anna Walker).  Anna Walker is currently in the action stage of change. As such, her goal is to continue with weight loss efforts. She has agreed to the Category 3 Plan.   She will work on meal planning and intentional eating.   Exercise goals: Anna Walker will continue walking 5,000 steps 5 times per week.  Behavioral modification strategies: increasing lean protein intake, decreasing simple carbohydrates, increasing vegetables, increasing water intake, decreasing eating out, no skipping meals, meal planning and cooking strategies, keeping healthy foods in the home and planning for success.  Anna Walker has agreed to follow-up with our clinic in 2-3 weeks. She was informed of the importance of frequent follow-up visits to  maximize her success with intensive lifestyle modifications for her multiple health conditions.   Objective:   Blood pressure 124/85, pulse 67, temperature 97.7 F (36.5 C), height 5\' 5"  (1.651 m), weight 262 lb (118.8 kg), SpO2 98 %. Body mass index is 43.6 kg/m.  General: Cooperative, alert, well developed, in no acute distress. HEENT: Conjunctivae and lids unremarkable. Cardiovascular: Regular rhythm.  Lungs: Normal  work of breathing. Neurologic: No focal deficits. Pt is ambulating with a cane.  Lab Results  Component Value Date   CREATININE 0.81 09/17/2019   BUN 16 09/17/2019   NA 143 09/17/2019   K 4.3 09/17/2019   CL 103 09/17/2019   CO2 25 09/17/2019   Lab Results  Component Value Date   ALT 13 09/17/2019   AST 15 09/17/2019   ALKPHOS 101 09/17/2019   BILITOT 0.2 09/17/2019   Lab Results  Component Value Date   HGBA1C 5.8 (H) 09/17/2019   HGBA1C 6.0 (H) 08/14/2018   HGBA1C 5.9 (H) 02/09/2015   Lab Results  Component Value Date   INSULIN 23.3 09/17/2019   INSULIN 22.3 05/01/2019   Lab Results  Component Value Date   TSH 1.507 02/09/2015   Lab Results  Component Value Date   CHOL 147 09/17/2019   HDL 48 09/17/2019   LDLCALC 84 09/17/2019   TRIG 76 09/17/2019   CHOLHDL 4.6 02/09/2015   Lab Results  Component Value Date   WBC 8.1 01/27/2019   HGB 13.0 01/27/2019   HCT 41.6 01/27/2019   MCV 88.5 01/27/2019   PLT 279 01/27/2019   No results found for: IRON, TIBC, FERRITIN  Obesity Behavioral Intervention:   Approximately 15 minutes were spent on the discussion below.  ASK: We discussed the diagnosis of obesity with Anna Walker today and Anna Walker agreed to give [place] permission to discuss obesity behavioral modification therapy today.  ASSESS: Anna Walker has the diagnosis of obesity and her BMI today is 43.7. Anna Walker is in the action stage of change.   ADVISE: Anna Walker was educated on the multiple health risks of obesity as well as the benefit of weight loss to improve her health. She was advised of the need for long term treatment and the importance of lifestyle modifications to improve her current health and to decrease her risk of future health problems.  AGREE: Multiple dietary modification options and treatment options were discussed and Anna Walker agreed to follow the recommendations documented in the above note.  ARRANGE: Anna Walker was educated on the  importance of frequent visits to treat obesity as outlined per CMS and USPSTF guidelines and agreed to schedule her next follow up appointment today.  Attestation Statements:   Reviewed by clinician on day of visit: allergies, medications, problem list, medical history, surgical history, family history, social history, and previous encounter notes.  Migdalia Dk, am acting as Location manager for [employer], DO   I have reviewed the above documentation for accuracy and completeness, and I agree with the above. Jearld Lesch, DO

## 2019-11-19 ENCOUNTER — Emergency Department (HOSPITAL_COMMUNITY)
Admission: EM | Admit: 2019-11-19 | Discharge: 2019-11-20 | Disposition: A | Discharge status: Home / Self Care | Payer: PPO | Attending: Emergency Medicine | Admitting: Emergency Medicine

## 2019-11-19 ENCOUNTER — Encounter (HOSPITAL_COMMUNITY): Payer: Self-pay | Admitting: Emergency Medicine

## 2019-11-19 ENCOUNTER — Other Ambulatory Visit: Payer: Self-pay

## 2019-11-19 DIAGNOSIS — Z87891 Personal history of nicotine dependence: Secondary | ICD-10-CM | POA: Diagnosis not present

## 2019-11-19 DIAGNOSIS — E039 Hypothyroidism, unspecified: Secondary | ICD-10-CM | POA: Insufficient documentation

## 2019-11-19 DIAGNOSIS — Y990 Civilian activity done for income or pay: Secondary | ICD-10-CM | POA: Insufficient documentation

## 2019-11-19 DIAGNOSIS — S61211A Laceration without foreign body of left index finger without damage to nail, initial encounter: Secondary | ICD-10-CM | POA: Diagnosis not present

## 2019-11-19 DIAGNOSIS — I10 Essential (primary) hypertension: Secondary | ICD-10-CM | POA: Diagnosis not present

## 2019-11-19 DIAGNOSIS — Z7982 Long term (current) use of aspirin: Secondary | ICD-10-CM | POA: Diagnosis not present

## 2019-11-19 DIAGNOSIS — Y9289 Other specified places as the place of occurrence of the external cause: Secondary | ICD-10-CM | POA: Diagnosis not present

## 2019-11-19 DIAGNOSIS — Z23 Encounter for immunization: Secondary | ICD-10-CM | POA: Insufficient documentation

## 2019-11-19 DIAGNOSIS — Z79899 Other long term (current) drug therapy: Secondary | ICD-10-CM | POA: Diagnosis not present

## 2019-11-19 DIAGNOSIS — W3189XA Contact with other specified machinery, initial encounter: Secondary | ICD-10-CM | POA: Insufficient documentation

## 2019-11-19 DIAGNOSIS — Y9389 Activity, other specified: Secondary | ICD-10-CM | POA: Diagnosis not present

## 2019-11-19 DIAGNOSIS — E119 Type 2 diabetes mellitus without complications: Secondary | ICD-10-CM | POA: Insufficient documentation

## 2019-11-19 NOTE — ED Triage Notes (Signed)
Patient presents with skin laceration at distal tip of her left index finger sustained from a blade this evening while working on crafts. Dressing applied at triage .

## 2019-11-20 MED ORDER — TETANUS-DIPHTH-ACELL PERTUSSIS 5-2.5-18.5 LF-MCG/0.5 IM SUSY
0.5000 mL | PREFILLED_SYRINGE | Freq: Once | INTRAMUSCULAR | Status: AC
  Administered 2019-11-20: 0.5 mL via INTRAMUSCULAR
  Filled 2019-11-20: qty 0.5

## 2019-11-20 NOTE — Discharge Instructions (Signed)
Avoid soaking your wound in stagnant or dirty water such as while taking a bath. You can shower normally. Keep the area clean with mild soap and warm water. Do not apply peroxide or alcohol to your wound as this can break down newly forming skin and prolong wound healing. If you keep the area bandaged, change the dressing/bandage at least once per day.  Follow-up with your primary care doctor for wound recheck, as needed.

## 2019-11-20 NOTE — ED Provider Notes (Signed)
[facility] EMERGENCY DEPARTMENT Provider Note   CSN: [id number] Arrival date & time: 11/19/19  2315     History Chief Complaint  Patient presents with  . Finger Laceration    Anna Walker is a 64 y.o. female.  The history is provided by the patient. No language interpreter was used.  Laceration Location:  Finger Finger laceration location:  L index finger Length:  0.5cm Depth:  Through dermis Quality: straight   Bleeding: controlled   Injury mechanism: roller cutter. Pain details:    Severity:  Mild   Timing:  Constant   Progression:  Unchanged Foreign body present:  No foreign bodies Relieved by:  Pressure Tetanus status:  Unknown Associated symptoms comment:  Subjective decreased sensation in the distal digit      Past Medical History:  Diagnosis Date  . ADD (attention deficit disorder)   . Anxiety   . Biliary colic   . Bladder leak   . Diabetes mellitus without complication (HCC)    type 2 , diet controlled   . Diverticular disease   . DVT (deep venous thrombosis) (HCC)    left leg , resolved after treatment of blood thinners   . Edema of both lower extremities   . GERD (gastroesophageal reflux disease)    causes coughing   . Glaucoma   . High cholesterol   . Hypertension   . Hypothyroid   . Leg cramps    "in my thighs"   . Leg cramps   . Migraines   . Multiple sclerosis ([place])    per patient " that ha sbeen ruled out , they though it was that initially but it did not preogress like it"  . Optic neuritis    lesion on right optic nerve; right eye only , reduced vision and blurriness , loss of depth perception, glasses do not help when it flares    . OSA (obstructive sleep apnea)    nightly cpap   . Paresthesias/numbness    fingers and toes   . Pneumonia 2017  . Restless leg syndrome   . Sleep apnea   . SOB (shortness of breath)   . Thyroid disease    Hypothyroidism    Patient Active Problem List   Diagnosis Date  Noted  . Vitamin D deficiency 05/02/2019  . Morbid obesity ([place]) 05/01/2019  . Chronic pain of both knees 04/25/2017  . DOE (dyspnea on exertion)   . Substernal chest pain 02/08/2015  . Attention deficit disorder 12/22/2014  . History of optic neuritis 06/20/2014  . OSA on CPAP 06/20/2014  . Restless leg syndrome 06/20/2014  . Other fatigue 06/20/2014  . Migraine without aura and without status migrainosus, not intractable 06/20/2014  . possible MS 02/24/2014  . Hypothyroidism 02/24/2014  . GERD (gastroesophageal reflux disease) 02/24/2014    Past Surgical History:  Procedure Laterality Date  . ABDOMINAL HYSTERECTOMY    . BREAST LUMPECTOMY Left   . CESAREAN SECTION     x2   . CHOLECYSTECTOMY N/A 08/16/2018   Procedure: LAPAROSCOPIC CHOLECYSTECTOMY;  Surgeon: Greer Pickerel, MD;  Location: [facility] ORS;  Service: General;  Laterality: N/A;  . COLONOSCOPY WITH PROPOFOL N/A 01/03/2019   Procedure: COLONOSCOPY WITH PROPOFOL;  Surgeon: Wonda Horner, MD;  Location: [facility] ENDOSCOPY;  Service: Endoscopy;  Laterality: N/A;  . MENISCUS REPAIR Left left  . MENISCUS REPAIR Right    knee   . TUBAL LIGATION     during 2nd c section  OB History    Gravida  2   Para  2   Term      Preterm      AB      Living        SAB      TAB      Ectopic      Multiple      Live Births              Family History  Problem Relation Age of Onset  . Barrett's esophagus Mother   . Hypertension Mother   . Obesity Mother   . Graves' disease Sister   . Rheum arthritis Sister   . Heart attack Father 31  . Sudden death Father   . Coronary artery disease Neg Hx     Social History   Tobacco Use  . Smoking status: Former Smoker    Quit date: 01/25/1984    Years since quitting: 35.8  . Smokeless tobacco: Never Used  Vaping Use  . Vaping Use: Never used  Substance Use Topics  . Alcohol use: Not Currently  . Drug use: No    Home Medications Prior to Admission medications     Medication Sig Start Date End Date Taking? Authorizing Provider  acetaminophen (TYLENOL) 500 MG tablet Take 500 mg by mouth every 6 (six) hours as needed.    [provider]  amantadine (SYMMETREL) 100 MG capsule TAKE 1 CAPSULE(100 MG) BY MOUTH THREE TIMES DAILY Patient taking differently: Take 100-200 mg by mouth See admin instructions. TAKE 2 CAPSULES (200 MG) BY MOUTH SCHEDULED EVERY MORNING & MAY TAKE 1 CAPSULE (100 MG) BY MOUTH IN THE AFTERNOON AS NEEDED FOR MULTIPLE SCLEROSIS SYMPTOMS 07/09/18   Pieter Partridge, DO  ASPERCREME LIDOCAINE EX Apply topically.    [provider]  aspirin (ASPIRIN EC) 81 MG EC tablet Take 81 mg by mouth daily. Swallow whole.    [provider]  FLUoxetine (PROZAC) 10 MG capsule Take 10 mg by mouth at bedtime. Take with 20 mg dose 03/19/17   [provider]  FLUoxetine (PROZAC) 20 MG capsule Take 20 mg by mouth at bedtime. Take with 10 mg dose 06/21/18   [provider]  gabapentin (NEURONTIN) 300 MG capsule TAKE 1 CAPSULE BY MOUTH AT [facility] AND 2 CAPSULES AT BEDTIME 04/29/19   Tomi Likens, Adam R, DO  hydrochlorothiazide (HYDRODIURIL) 12.5 MG tablet Take 12.5 mg by mouth daily.  02/06/17   [provider]  levothyroxine (SYNTHROID, LEVOTHROID) 150 MCG tablet Take 150 mcg by mouth daily before breakfast.    [provider]  omeprazole (PRILOSEC) 40 MG capsule Take 1 capsule (40 mg total) by mouth 2 (two) times daily before a meal. 02/10/15   Barton Dubois, MD  ondansetron (ZOFRAN) 4 MG tablet Take 4 mg by mouth every 8 (eight) hours as needed for nausea or vomiting.    [provider]  oxybutynin (DITROPAN) 5 MG tablet Take 5 mg by mouth at bedtime.     [provider]  rOPINIRole (REQUIP) 3 MG tablet TAKE 1 TABLET BY MOUTH DAILY AT DINNER AND 1 TABLET AT BEDTIME. PLEASE CALL [phone] TO SCHEDULE AN APPOINTMENT 02/18/19   Pieter Partridge, DO  simvastatin (ZOCOR) 10 MG tablet Take 10 mg by mouth every  evening.     [provider]  SUMAtriptan (IMITREX) 100 MG tablet Take 1 tablet (100 mg total) by mouth once as needed for migraine. May repeat in 2 hours if  headache persists or recurs. 09/21/18   Pieter Partridge, DO  Vitamin D, Ergocalciferol, (DRISDOL) 1.25 MG (50000 UNIT) CAPS capsule Take 1 capsule (50,000 Units total) by mouth every 7 (seven) days. 10/24/19   Jearld Lesch A, DO    Allergies    Adderall [amphetamine-dextroamphetamine] and Topamax [topiramate]  Review of Systems   Review of Systems  Ten systems reviewed and are negative for acute change, except as noted in the HPI.    Physical Exam Updated Vital Signs BP 138/71 (BP Location: Right Arm)   Pulse 71   Temp 98.1 F (36.7 C) (Oral)   Resp 16   Ht 5\' 5"  (1.651 m)   Wt 130 kg   SpO2 99%   BMI 47.69 kg/m   Physical Exam Vitals and nursing note reviewed.  Constitutional:      General: She is not in acute distress.    Appearance: She is well-developed. She is not diaphoretic.     Comments: Nontoxic appearing and in NAD  HENT:     Head: Normocephalic and atraumatic.  Eyes:     General: No scleral icterus.    Conjunctiva/sclera: Conjunctivae normal.  Cardiovascular:     Rate and Rhythm: Normal rate and regular rhythm.     Pulses: Normal pulses.     Comments: Capillary refill intact in the L index finger. Pulmonary:     Effort: Pulmonary effort is normal. No respiratory distress.     Comments: Respirations even and unlabored Musculoskeletal:        General: Normal range of motion.     Cervical back: Normal range of motion.     Comments: Linear laceration to the the distal tip of the left index finger terminating at the nail without nail involvement.  No active bleeding.  Normal range of motion of the digit.  Skin:    General: Skin is warm and dry.     Coloration: Skin is not pale.     Findings: No erythema or rash.  Neurological:     Mental Status: She is alert and oriented to person, place, and time.      Coordination: Coordination normal.  Psychiatric:        Behavior: Behavior normal.     ED Results / Procedures / Treatments   Labs (all labs ordered are listed, but only abnormal results are displayed) Labs Reviewed - No data to display  EKG None  Radiology No results found.  Procedures Procedures (including critical care time)  LACERATION REPAIR Performed by: Reita Cliche, PA-S Authorized by: Antonietta Breach Consent: Verbal consent obtained. Risks and benefits: risks, benefits and alternatives were discussed Consent given by: patient Patient identity confirmed: provided demographic data Prepped and Draped in normal sterile fashion Wound explored  Laceration Location: L index finger  Laceration Length: 0.5cm  No Foreign Bodies seen or palpated  Amount of cleaning: standard  Skin closure: Dermabond  Number of sutures: n/a  Technique: simple  Patient tolerance: Patient tolerated the procedure well with no immediate complications.   Medications Ordered in ED Medications - No data to display  ED Course  I have reviewed the triage vital signs and the nursing notes.  Pertinent labs & imaging results that were available during my care of the patient were reviewed by me and considered in my medical decision making (see chart for details).    MDM Rules/Calculators/A&P  Tdap booster given. Laceration occurred < 8 hours prior to repair which was well tolerated. Pt has no comorbidities to effect normal wound healing. Discussed home care with pt and answered questions. Pt to follow up for wound check PRN. Pt is hemodynamically stable w no complaints prior to dc.     Final Clinical Impression(s) / ED Diagnoses Final diagnoses:  Laceration of left index finger without foreign body without damage to nail, initial encounter    Rx / DC Orders ED Discharge Orders    None       Antonietta Breach, PA-C 11/20/19 0156    Ripley Fraise,  MD 11/20/19 0201

## 2019-11-22 ENCOUNTER — Other Ambulatory Visit: Payer: Self-pay | Admitting: Neurology

## 2019-11-23 ENCOUNTER — Encounter (INDEPENDENT_AMBULATORY_CARE_PROVIDER_SITE_OTHER): Payer: Self-pay | Admitting: Bariatrics

## 2019-12-03 ENCOUNTER — Encounter (INDEPENDENT_AMBULATORY_CARE_PROVIDER_SITE_OTHER): Payer: Self-pay | Admitting: Bariatrics

## 2019-12-03 ENCOUNTER — Other Ambulatory Visit: Payer: Self-pay

## 2019-12-03 ENCOUNTER — Ambulatory Visit (INDEPENDENT_AMBULATORY_CARE_PROVIDER_SITE_OTHER): Payer: PPO | Admitting: Bariatrics

## 2019-12-03 VITALS — BP 114/79 | HR 80 | Temp 98.6°F | Ht 65.0 in | Wt 258.0 lb

## 2019-12-03 DIAGNOSIS — E7849 Other hyperlipidemia: Secondary | ICD-10-CM

## 2019-12-03 DIAGNOSIS — Z6841 Body Mass Index (BMI) 40.0 and over, adult: Secondary | ICD-10-CM | POA: Diagnosis not present

## 2019-12-03 DIAGNOSIS — I10 Essential (primary) hypertension: Secondary | ICD-10-CM

## 2019-12-03 NOTE — Progress Notes (Signed)
Chief Complaint:   OBESITY Anna Walker is here to discuss her progress with her obesity treatment plan along with follow-up of her obesity related diagnoses. Anna Walker is on the Category 3 Plan and states she is following her eating plan approximately 0% of the time. Anna Walker states she is exercising 0 minutes 0 times per week.  Today's visit was #: 11 Starting weight: 277 lbs Starting date: 05/01/2019 Today's weight: 258 lbs Today's date: 12/03/2019 Total lbs lost to date: 19 Total lbs lost since last in-office visit: 4  Interim History: Anna Walker is down an additional 4 lbs. She reports doing well with her water intake.  Subjective:   Other hyperlipidemia. Anna Walker is taking Zocor and denies myalgias.   Lab Results  Component Value Date   CHOL 147 09/17/2019   HDL 48 09/17/2019   LDLCALC 84 09/17/2019   TRIG 76 09/17/2019   CHOLHDL 4.6 02/09/2015   Lab Results  Component Value Date   ALT 13 09/17/2019   AST 15 09/17/2019   ALKPHOS 101 09/17/2019   BILITOT 0.2 09/17/2019   The 10-year ASCVD risk score Mikey Bussing DC Jr., et al., 2013) is: 7.2%   Values used to calculate the score:     Age: 64 years     Sex: Female     Is Non-Hispanic African American: No     Diabetic: Yes     Tobacco smoker: No     Systolic Blood Pressure: 341 mmHg     Is BP treated: Yes     HDL Cholesterol: 48 mg/dL     Total Cholesterol: 147 mg/dL  Essential hypertension. Blood pressure is controlled.  BP Readings from Last 3 Encounters:  12/03/19 114/79  11/20/19 140/85  11/14/19 124/85   Lab Results  Component Value Date   CREATININE 0.81 09/17/2019   CREATININE 0.85 01/27/2019   CREATININE 1.05 (H) 10/21/2018   Assessment/Plan:   Other hyperlipidemia. Cardiovascular risk and specific lipid/LDL goals reviewed.  We discussed several lifestyle modifications today and Anna Walker will continue to work on diet, exercise and weight loss efforts. Orders and follow up as documented in  patient record. She will continue her medication as directed.   Counseling Intensive lifestyle modifications are the first line treatment for this issue. . Dietary changes: Increase soluble fiber. Decrease simple carbohydrates. . Exercise changes: Moderate to vigorous-intensity aerobic activity 150 minutes per week if tolerated. . Lipid-lowering medications: see documented in medical record.  Essential hypertension. Anna Walker is working on healthy weight loss and exercise to improve blood pressure control. We will watch for signs of hypotension as she continues her lifestyle modifications. She will continue her medication as directed.   Class 3 severe obesity with serious comorbidity and body mass index (BMI) of 40.0 to 44.9 in adult, unspecified obesity type (Anna Walker).  Anna Walker is currently in the action stage of change. As such, her goal is to continue with weight loss efforts. She has agreed to the Category 3 Plan.   She will adhere more closely to the plan, work on meal planning, and increase her protein intake.  Exercise goals: All adults should avoid inactivity. Some physical activity is better than none, and adults who participate in any amount of physical activity gain some health benefits.  Behavioral modification strategies: increasing lean protein intake, decreasing simple carbohydrates, increasing vegetables, increasing water intake, decreasing eating out, no skipping meals, meal planning and cooking strategies, keeping healthy foods in the home and planning for success.  Anna Walker has agreed  to follow-up with our clinic fasting in 3 weeks. She was informed of the importance of frequent follow-up visits to maximize her success with intensive lifestyle modifications for her multiple health conditions.   Objective:   Blood pressure 114/79, pulse 80, temperature 98.6 F (37 C), height 5\' 5"  (1.651 m), weight 258 lb (117 kg), SpO2 96 %. Body mass index is 42.93 kg/m.  General:  Cooperative, alert, well developed, in no acute distress. HEENT: Conjunctivae and lids unremarkable. Cardiovascular: Regular rhythm.  Lungs: Normal work of breathing. Neurologic: No focal deficits. Pt is walking with a cane.  Lab Results  Component Value Date   CREATININE 0.81 09/17/2019   BUN 16 09/17/2019   NA 143 09/17/2019   K 4.3 09/17/2019   CL 103 09/17/2019   CO2 25 09/17/2019   Lab Results  Component Value Date   ALT 13 09/17/2019   AST 15 09/17/2019   ALKPHOS 101 09/17/2019   BILITOT 0.2 09/17/2019   Lab Results  Component Value Date   HGBA1C 5.8 (H) 09/17/2019   HGBA1C 6.0 (H) 08/14/2018   HGBA1C 5.9 (H) 02/09/2015   Lab Results  Component Value Date   INSULIN 23.3 09/17/2019   INSULIN 22.3 05/01/2019   Lab Results  Component Value Date   TSH 1.507 02/09/2015   Lab Results  Component Value Date   CHOL 147 09/17/2019   HDL 48 09/17/2019   LDLCALC 84 09/17/2019   TRIG 76 09/17/2019   CHOLHDL 4.6 02/09/2015   Lab Results  Component Value Date   WBC 8.1 01/27/2019   HGB 13.0 01/27/2019   HCT 41.6 01/27/2019   MCV 88.5 01/27/2019   PLT 279 01/27/2019   No results found for: IRON, TIBC, FERRITIN  Obesity Behavioral Intervention:   Approximately 15 minutes were spent on the discussion below.  ASK: We discussed the diagnosis of obesity with Anna Walker today and Anna Walker agreed to give [place] permission to discuss obesity behavioral modification therapy today.  ASSESS: Anna Walker has the diagnosis of obesity and her BMI today is 43.0. Anna Walker is in the action stage of change.   ADVISE: Anna Walker was educated on the multiple health risks of obesity as well as the benefit of weight loss to improve her health. She was advised of the need for long term treatment and the importance of lifestyle modifications to improve her current health and to decrease her risk of future health problems.  AGREE: Multiple dietary modification options and treatment  options were discussed and Anna Walker agreed to follow the recommendations documented in the above note.  ARRANGE: Anna Walker was educated on the importance of frequent visits to treat obesity as outlined per CMS and USPSTF guidelines and agreed to schedule her next follow up appointment today.  Attestation Statements:   Reviewed by clinician on day of visit: allergies, medications, problem list, medical history, surgical history, family history, social history, and previous encounter notes.  Migdalia Dk, am acting as Location manager for [employer], DO   I have reviewed the above documentation for accuracy and completeness, and I agree with the above. Jearld Lesch, DO

## 2019-12-04 ENCOUNTER — Encounter (INDEPENDENT_AMBULATORY_CARE_PROVIDER_SITE_OTHER): Payer: Self-pay | Admitting: Bariatrics

## 2019-12-05 DIAGNOSIS — M8580 Other specified disorders of bone density and structure, unspecified site: Secondary | ICD-10-CM | POA: Diagnosis not present

## 2019-12-05 DIAGNOSIS — Z1231 Encounter for screening mammogram for malignant neoplasm of breast: Secondary | ICD-10-CM | POA: Diagnosis not present

## 2019-12-11 DIAGNOSIS — G4721 Circadian rhythm sleep disorder, delayed sleep phase type: Secondary | ICD-10-CM | POA: Diagnosis not present

## 2019-12-11 DIAGNOSIS — G4733 Obstructive sleep apnea (adult) (pediatric): Secondary | ICD-10-CM | POA: Diagnosis not present

## 2019-12-18 DIAGNOSIS — R922 Inconclusive mammogram: Secondary | ICD-10-CM | POA: Diagnosis not present

## 2019-12-18 DIAGNOSIS — R928 Other abnormal and inconclusive findings on diagnostic imaging of breast: Secondary | ICD-10-CM | POA: Diagnosis not present

## 2019-12-24 ENCOUNTER — Ambulatory Visit (INDEPENDENT_AMBULATORY_CARE_PROVIDER_SITE_OTHER): Payer: PPO | Admitting: Bariatrics

## 2019-12-24 ENCOUNTER — Other Ambulatory Visit: Payer: Self-pay

## 2019-12-24 ENCOUNTER — Encounter (INDEPENDENT_AMBULATORY_CARE_PROVIDER_SITE_OTHER): Payer: Self-pay | Admitting: Bariatrics

## 2019-12-24 VITALS — BP 126/80 | HR 71 | Temp 97.9°F | Ht 65.0 in | Wt 262.0 lb

## 2019-12-24 DIAGNOSIS — E559 Vitamin D deficiency, unspecified: Secondary | ICD-10-CM | POA: Diagnosis not present

## 2019-12-24 DIAGNOSIS — Z6841 Body Mass Index (BMI) 40.0 and over, adult: Secondary | ICD-10-CM

## 2019-12-24 DIAGNOSIS — R7303 Prediabetes: Secondary | ICD-10-CM

## 2019-12-24 MED ORDER — VITAMIN D (ERGOCALCIFEROL) 1.25 MG (50000 UNIT) PO CAPS: 50000.0000 [IU] | ORAL_CAPSULE | ORAL | 0 refills | Status: DC

## 2019-12-24 NOTE — Progress Notes (Signed)
Chief Complaint:   OBESITY Anna Walker is here to discuss her progress with her obesity treatment plan along with follow-up of her obesity related diagnoses. Anna Walker is on the Category 3 Plan and states she is following her eating plan approximately 0% of the time. Anna Walker states she is walking 5,000 steps 6 times per week.  Today's visit was #: 12 Starting weight: 277 lbs Starting date: 05/01/2019 Today's weight: 262 lbs Today's date: 12/24/2019 Total lbs lost to date: 15 Total lbs lost since last in-office visit: 0  Interim History: Anna Walker is up 4 lbs over the Thanksgiving holiday.  Subjective:   Vitamin D deficiency. Anna Walker is taking Vitamin D supplementation.    Ref. Range 09/17/2019 10:30  Vitamin D, 25-Hydroxy Latest Ref Range: 30.0 - 100.0 ng/mL 34.5   Prediabetes. Anna Walker has a diagnosis of prediabetes based on her elevated HgA1c and was informed this puts her at greater risk of developing diabetes. She continues to work on diet and exercise to decrease her risk of diabetes. She denies nausea or hypoglycemia. Anna Walker is on no medication.  Lab Results  Component Value Date   HGBA1C 5.8 (H) 09/17/2019   Lab Results  Component Value Date   INSULIN 23.3 09/17/2019   INSULIN 22.3 05/01/2019   Assessment/Plan:   Vitamin D deficiency. Low Vitamin D level contributes to fatigue and are associated with obesity, breast, and colon cancer. She was given a prescription for Vitamin D, Ergocalciferol, (DRISDOL) 1.25 MG (50000 UNIT) CAPS capsule every week #4 with 0 refills and VITAMIN D 25 Hydroxy (Vit-D Deficiency, Fractures) level will be checked today.   Prediabetes. Anna Walker will continue to work on weight loss, increasing activities, and decreasing simple carbohydrates to help decrease the risk of diabetes. Comprehensive metabolic panel, Hemoglobin A1c, Insulin, random labs will be checked today.   Class 3 severe obesity with serious comorbidity and body  mass index (BMI) of 40.0 to 44.9 in adult, unspecified obesity type ([place]).  Anna Walker is currently in the action stage of change. As such, her goal is to continue with weight loss efforts. She has agreed to the Category 3 Plan.   She will work on meal planning and mindful eating.   Exercise goals: Anna Walker will increase her steps.  Behavioral modification strategies: increasing lean protein intake, decreasing simple carbohydrates, increasing vegetables, increasing water intake, decreasing eating out, no skipping meals, meal planning and cooking strategies, keeping healthy foods in the home, travel eating strategies, holiday eating strategies , celebration eating strategies and planning for success.  Anna Walker has agreed to follow-up with our clinic in 2-3 weeks. She was informed of the importance of frequent follow-up visits to maximize her success with intensive lifestyle modifications for her multiple health conditions.   Anna Walker was informed we would discuss her lab results at her next visit unless there is a critical issue that needs to be addressed sooner. Anna Walker agreed to keep her next visit at the agreed upon time to discuss these results.  Objective:   Blood pressure 126/80, pulse 71, temperature 97.9 F (36.6 C), height 5\' 5"  (1.651 m), weight 262 lb (118.8 kg), SpO2 96 %. Body mass index is 43.6 kg/m.  General: Cooperative, alert, well developed, in no acute distress. HEENT: Conjunctivae and lids unremarkable. Cardiovascular: Regular rhythm.  Lungs: Normal work of breathing. Neurologic: No focal deficits. Pt is using a cane for ambulation.  Lab Results  Component Value Date   CREATININE 0.81 09/17/2019   BUN 16 09/17/2019  NA 143 09/17/2019   K 4.3 09/17/2019   CL 103 09/17/2019   CO2 25 09/17/2019   Lab Results  Component Value Date   ALT 13 09/17/2019   AST 15 09/17/2019   ALKPHOS 101 09/17/2019   BILITOT 0.2 09/17/2019   Lab Results  Component Value  Date   HGBA1C 5.8 (H) 09/17/2019   HGBA1C 6.0 (H) 08/14/2018   HGBA1C 5.9 (H) 02/09/2015   Lab Results  Component Value Date   INSULIN 23.3 09/17/2019   INSULIN 22.3 05/01/2019   Lab Results  Component Value Date   TSH 1.507 02/09/2015   Lab Results  Component Value Date   CHOL 147 09/17/2019   HDL 48 09/17/2019   LDLCALC 84 09/17/2019   TRIG 76 09/17/2019   CHOLHDL 4.6 02/09/2015   Lab Results  Component Value Date   WBC 8.1 01/27/2019   HGB 13.0 01/27/2019   HCT 41.6 01/27/2019   MCV 88.5 01/27/2019   PLT 279 01/27/2019   No results found for: IRON, TIBC, FERRITIN  Obesity Behavioral Intervention:   Approximately 15 minutes were spent on the discussion below.  ASK: We discussed the diagnosis of obesity with Anna Walker today and Anna Walker agreed to give [place] permission to discuss obesity behavioral modification therapy today.  ASSESS: Anna Walker has the diagnosis of obesity and her BMI today is 43.7. Anna Walker is in the action stage of change.   ADVISE: Anna Walker was educated on the multiple health risks of obesity as well as the benefit of weight loss to improve her health. She was advised of the need for long term treatment and the importance of lifestyle modifications to improve her current health and to decrease her risk of future health problems.  AGREE: Multiple dietary modification options and treatment options were discussed and Anna Walker agreed to follow the recommendations documented in the above note.  ARRANGE: Anna Walker was educated on the importance of frequent visits to treat obesity as outlined per CMS and USPSTF guidelines and agreed to schedule her next follow up appointment today.  Attestation Statements:   Reviewed by clinician on day of visit: allergies, medications, problem list, medical history, surgical history, family history, social history, and previous encounter notes.  Migdalia Dk, am acting as Location manager for [employer], DO    I have reviewed the above documentation for accuracy and completeness, and I agree with the above. Jearld Lesch, DO

## 2019-12-25 LAB — COMPREHENSIVE METABOLIC PANEL
ALT: 19 IU/L (ref 0–32)
AST: 16 IU/L (ref 0–40)
Albumin/Globulin Ratio: 1.5 (ref 1.2–2.2)
Albumin: 4 g/dL (ref 3.8–4.8)
Alkaline Phosphatase: 88 IU/L (ref 44–121)
BUN/Creatinine Ratio: 18 (ref 12–28)
BUN: 15 mg/dL (ref 8–27)
Bilirubin Total: 0.3 mg/dL (ref 0.0–1.2)
CO2: 28 mmol/L (ref 20–29)
Calcium: 8.8 mg/dL (ref 8.7–10.3)
Chloride: 104 mmol/L (ref 96–106)
Creatinine, Ser: 0.83 mg/dL (ref 0.57–1.00)
GFR calc Af Amer: 87 mL/min/{1.73_m2} (ref >59)
GFR calc non Af Amer: 76 mL/min/{1.73_m2} (ref >59)
Globulin, Total: 2.7 g/dL (ref 1.5–4.5)
Glucose: 112 mg/dL — ABNORMAL HIGH (ref 65–99)
Potassium: 4.2 mmol/L (ref 3.5–5.2)
Sodium: 145 mmol/L — ABNORMAL HIGH (ref 134–144)
Total Protein: 6.7 g/dL (ref 6.0–8.5)

## 2019-12-25 LAB — VITAMIN D 25 HYDROXY (VIT D DEFICIENCY, FRACTURES): Vit D, 25-Hydroxy: 31.9 ng/mL (ref 30.0–100.0)

## 2019-12-25 LAB — HEMOGLOBIN A1C
Est. average glucose Bld gHb Est-mCnc: 111 mg/dL
Hgb A1c MFr Bld: 5.5 % (ref 4.8–5.6)

## 2019-12-25 LAB — INSULIN, RANDOM: INSULIN: 19.6 u[IU]/mL (ref 2.6–24.9)

## 2020-01-06 DIAGNOSIS — M546 Pain in thoracic spine: Secondary | ICD-10-CM | POA: Diagnosis not present

## 2020-01-09 DIAGNOSIS — H2513 Age-related nuclear cataract, bilateral: Secondary | ICD-10-CM | POA: Diagnosis not present

## 2020-01-09 DIAGNOSIS — H524 Presbyopia: Secondary | ICD-10-CM | POA: Diagnosis not present

## 2020-01-09 DIAGNOSIS — E119 Type 2 diabetes mellitus without complications: Secondary | ICD-10-CM | POA: Diagnosis not present

## 2020-01-09 DIAGNOSIS — H5213 Myopia, bilateral: Secondary | ICD-10-CM | POA: Diagnosis not present

## 2020-01-09 DIAGNOSIS — H47291 Other optic atrophy, right eye: Secondary | ICD-10-CM | POA: Diagnosis not present

## 2020-01-09 DIAGNOSIS — H25043 Posterior subcapsular polar age-related cataract, bilateral: Secondary | ICD-10-CM | POA: Diagnosis not present

## 2020-01-09 DIAGNOSIS — H52203 Unspecified astigmatism, bilateral: Secondary | ICD-10-CM | POA: Diagnosis not present

## 2020-01-14 ENCOUNTER — Encounter (INDEPENDENT_AMBULATORY_CARE_PROVIDER_SITE_OTHER): Payer: Self-pay | Admitting: Bariatrics

## 2020-01-14 ENCOUNTER — Other Ambulatory Visit: Payer: Self-pay

## 2020-01-14 ENCOUNTER — Ambulatory Visit (INDEPENDENT_AMBULATORY_CARE_PROVIDER_SITE_OTHER): Payer: PPO | Admitting: Bariatrics

## 2020-01-14 VITALS — BP 119/72 | HR 82 | Temp 97.9°F | Ht 65.0 in | Wt 260.0 lb

## 2020-01-14 DIAGNOSIS — E559 Vitamin D deficiency, unspecified: Secondary | ICD-10-CM

## 2020-01-14 DIAGNOSIS — Z6841 Body Mass Index (BMI) 40.0 and over, adult: Secondary | ICD-10-CM

## 2020-01-14 DIAGNOSIS — E038 Other specified hypothyroidism: Secondary | ICD-10-CM | POA: Diagnosis not present

## 2020-01-14 MED ORDER — VITAMIN D (ERGOCALCIFEROL) 1.25 MG (50000 UNIT) PO CAPS: 50000.0000 [IU] | ORAL_CAPSULE | ORAL | 0 refills | Status: DC

## 2020-01-14 NOTE — Progress Notes (Signed)
Chief Complaint:   OBESITY Anna Walker is here to discuss her progress with her obesity treatment plan along with follow-up of her obesity related diagnoses. Anna Walker is on the Category 3 Plan and states she is following her eating plan approximately 60% of the time. Anna Walker states she is exercising 0 minutes 0 times per week.  Today's visit was #: 13 Starting weight: 277 lbs Starting date: 05/01/2019 Today's weight: 260 lbs Today's date: 01/14/2020 Total lbs lost to date: 17 Total lbs lost since last in-office visit: 2  Interim History: Anna Walker is down 2 lbs. She is taking a muscle relaxer and NSAIDs for back pain. She reports doing well with breakfast and lunch, but struggles with dinner.  Subjective:   Vitamin D deficiency. Anna Walker is taking Vitamin D supplementation.    Ref. Range 12/24/2019 09:42  Vitamin D, 25-Hydroxy Latest Ref Range: 30.0 - 100.0 ng/mL 31.9   Other specified hypothyroidism. Anna Walker is taking Synthroid. She reports energy is okay.   Lab Results  Component Value Date   TSH 1.507 02/09/2015   Assessment/Plan:   Vitamin D deficiency. Low Vitamin D level contributes to fatigue and are associated with obesity, breast, and colon cancer. She was given a prescription for Vitamin D, Ergocalciferol, (DRISDOL) 1.25 MG (50000 UNIT) CAPS capsule every week #4 with 0 refills and will follow-up for routine testing of Vitamin D, at least 2-3 times per year to avoid over-replacement.   Other specified hypothyroidism. Patient with long-standing hypothyroidism, on levothyroxine therapy. She appears euthyroid. Orders and follow up as documented in patient record. Anna Walker will continue Synthroid as directed.   Counseling . Good thyroid control is important for overall health. Supratherapeutic thyroid levels are dangerous and will not improve weight loss results. . The correct way to take levothyroxine is fasting, with water, separated by at least 30 minutes  from breakfast, and separated by more than 4 hours from calcium, iron, multivitamins, acid reflux medications (PPIs).   Class 3 severe obesity with serious comorbidity and body mass index (BMI) of 40.0 to 44.9 in adult, unspecified obesity type (Anna Walker).  Anna Walker is currently in the action stage of change. As such, her goal is to continue with weight loss efforts. She has agreed to the Category 3 Plan.   She will work on meal planning, intentional eating, increasing her protein intake, will not cook but will make good choices, and will continue to use her water app.  Exercise goals: All adults should avoid inactivity. Some physical activity is better than none, and adults who participate in any amount of physical activity gain some health benefits.  Behavioral modification strategies: increasing lean protein intake, decreasing simple carbohydrates, increasing vegetables, increasing water intake, decreasing eating out, no skipping meals, meal planning and cooking strategies, keeping healthy foods in the home, dealing with family or coworker sabotage, travel eating strategies, holiday eating strategies  and celebration eating strategies.  Anna Walker has agreed to follow-up with our clinic in 2-3 weeks. She was informed of the importance of frequent follow-up visits to maximize her success with intensive lifestyle modifications for her multiple health conditions.   Objective:   Blood pressure 119/72, pulse 82, temperature 97.9 F (36.6 C), temperature source Oral, height 5\' 5"  (1.651 m), weight 260 lb (117.9 kg), SpO2 95 %. Body mass index is 43.27 kg/m.  General: Cooperative, alert, well developed, in no acute distress. HEENT: Conjunctivae and lids unremarkable. Cardiovascular: Regular rhythm.  Lungs: Normal work of breathing. Neurologic: No focal deficits.  Lab Results  Component Value Date   CREATININE 0.83 12/24/2019   BUN 15 12/24/2019   NA 145 (H) 12/24/2019   K 4.2 12/24/2019   CL  104 12/24/2019   CO2 28 12/24/2019   Lab Results  Component Value Date   ALT 19 12/24/2019   AST 16 12/24/2019   ALKPHOS 88 12/24/2019   BILITOT 0.3 12/24/2019   Lab Results  Component Value Date   HGBA1C 5.5 12/24/2019   HGBA1C 5.8 (H) 09/17/2019   HGBA1C 6.0 (H) 08/14/2018   HGBA1C 5.9 (H) 02/09/2015   Lab Results  Component Value Date   INSULIN 19.6 12/24/2019   INSULIN 23.3 09/17/2019   INSULIN 22.3 05/01/2019   Lab Results  Component Value Date   TSH 1.507 02/09/2015   Lab Results  Component Value Date   CHOL 147 09/17/2019   HDL 48 09/17/2019   LDLCALC 84 09/17/2019   TRIG 76 09/17/2019   CHOLHDL 4.6 02/09/2015   Lab Results  Component Value Date   WBC 8.1 01/27/2019   HGB 13.0 01/27/2019   HCT 41.6 01/27/2019   MCV 88.5 01/27/2019   PLT 279 01/27/2019   No results found for: IRON, TIBC, FERRITIN  Obesity Behavioral Intervention:   Approximately 15 minutes were spent on the discussion below.  ASK: We discussed the diagnosis of obesity with Benjamine Mola today and Abbiegail agreed to give [place] permission to discuss obesity behavioral modification therapy today.  ASSESS: Mackenze has the diagnosis of obesity and her BMI today is 43.4. Darleene is in the action stage of change.   ADVISE: Evalyne was educated on the multiple health risks of obesity as well as the benefit of weight loss to improve her health. She was advised of the need for long term treatment and the importance of lifestyle modifications to improve her current health and to decrease her risk of future health problems.  AGREE: Multiple dietary modification options and treatment options were discussed and Kayleann agreed to follow the recommendations documented in the above note.  ARRANGE: Krislynn was educated on the importance of frequent visits to treat obesity as outlined per CMS and USPSTF guidelines and agreed to schedule her next follow up appointment today.  Attestation  Statements:   Reviewed by clinician on day of visit: allergies, medications, problem list, medical history, surgical history, family history, social history, and previous encounter notes.  Migdalia Dk, am acting as Location manager for [employer], DO   I have reviewed the above documentation for accuracy and completeness, and I agree with the above. Jearld Lesch, DO

## 2020-01-31 ENCOUNTER — Other Ambulatory Visit: Payer: Self-pay

## 2020-01-31 ENCOUNTER — Other Ambulatory Visit: Payer: PPO

## 2020-01-31 DIAGNOSIS — Z20822 Contact with and (suspected) exposure to covid-19: Secondary | ICD-10-CM | POA: Diagnosis not present

## 2020-01-31 NOTE — Progress Notes (Signed)
lab

## 2020-02-04 ENCOUNTER — Other Ambulatory Visit: Payer: Self-pay

## 2020-02-04 ENCOUNTER — Encounter (INDEPENDENT_AMBULATORY_CARE_PROVIDER_SITE_OTHER): Payer: Self-pay | Admitting: Bariatrics

## 2020-02-04 ENCOUNTER — Telehealth (INDEPENDENT_AMBULATORY_CARE_PROVIDER_SITE_OTHER): Payer: HMO | Admitting: Bariatrics

## 2020-02-04 DIAGNOSIS — Z6841 Body Mass Index (BMI) 40.0 and over, adult: Secondary | ICD-10-CM | POA: Diagnosis not present

## 2020-02-04 DIAGNOSIS — E038 Other specified hypothyroidism: Secondary | ICD-10-CM

## 2020-02-04 DIAGNOSIS — E559 Vitamin D deficiency, unspecified: Secondary | ICD-10-CM

## 2020-02-04 LAB — NOVEL CORONAVIRUS, NAA: SARS-CoV-2, NAA: NOT DETECTED

## 2020-02-04 MED ORDER — VITAMIN D (ERGOCALCIFEROL) 1.25 MG (50000 UNIT) PO CAPS: 50000.0000 [IU] | ORAL_CAPSULE | ORAL | 0 refills | Status: DC

## 2020-02-06 NOTE — Progress Notes (Signed)
TeleHealth Visit:  Due to the COVID-19 pandemic, this visit was completed with telemedicine (audio/video) technology to reduce patient and provider exposure as well as to preserve personal protective equipment.   Anna Walker has verbally consented to this TeleHealth visit. The patient is located at home, the provider is located at the [facility] office. The participants in this visit include the listed provider and patient. The visit was conducted today via telephone.  Chief Complaint: OBESITY Anna Walker is here to discuss her progress with her obesity treatment plan along with follow-up of her obesity related diagnoses. Anna Walker is on the Category 3 Plan and states she is following her eating plan approximately 0% of the time. Anna Walker states she is not exercising regularly at this time.  Today's visit was #: 14 Starting weight: 277 lbs Starting date: 05/01/2019  Interim History: Anna Walker states that she has gained weight.  Her scale at home reports a weight of 265 pounds.  Subjective:   1. Vitamin D deficiency Anna Walker's Vitamin D level was 31.9 on 12/24/2019. She is currently taking prescription vitamin D 50,000 IU each week. She denies nausea, vomiting or muscle weakness.  No sun exposure.  2. Other specified hypothyroidism Anna Walker is taking Synthroid 150 mcg daily.  Lab Results  Component Value Date   TSH 1.507 02/09/2015   Assessment/Plan:   1. Vitamin D deficiency Low Vitamin D level contributes to fatigue and are associated with obesity, breast, and colon cancer. She agrees to continue to take prescription Vitamin D @50 ,000 IU every week and will follow-up for routine testing of Vitamin D, at least 2-3 times per year to avoid over-replacement.  -Refill Vitamin D, Ergocalciferol, (DRISDOL) 1.25 MG (50000 UNIT) CAPS capsule; Take 1 capsule (50,000 Units total) by mouth every 7 (seven) days.  Dispense: 4 capsule; Refill: 0  2. Other specified  hypothyroidism Patient with long-standing hypothyroidism, on levothyroxine therapy. She appears euthyroid.  Continue Synthroid.  Counseling . Good thyroid control is important for overall health. Supratherapeutic thyroid levels are dangerous and will not improve weight loss results. . The correct way to take levothyroxine is fasting, with water, separated by at least 30 minutes from breakfast, and separated by more than 4 hours from calcium, iron, multivitamins, acid reflux medications (PPIs).   3. Class 3 severe obesity with serious comorbidity and body mass index (BMI) of 40.0 to 44.9 in adult, unspecified obesity type [facility])  Anna Walker is currently in the action stage of change. As such, her goal is to continue with weight loss efforts. She has agreed to the Category 3 Plan.   She will work on meal planning, intentional eating, and will get back on the Category 3 Plan.  Exercise goals: All adults should avoid inactivity. Some physical activity is better than none, and adults who participate in any amount of physical activity gain some health benefits.  Behavioral modification strategies: increasing lean protein intake, decreasing simple carbohydrates, increasing vegetables, increasing water intake, decreasing eating out, no skipping meals, meal planning and cooking strategies and keeping healthy foods in the home.  Christe has agreed to follow-up with our clinic in 2-3 weeks. She was informed of the importance of frequent follow-up visits to maximize her success with intensive lifestyle modifications for her multiple health conditions.  Objective:   VITALS: Per patient if applicable, see vitals. GENERAL: Alert and in no acute distress. CARDIOPULMONARY: No increased WOB. Speaking in clear sentences.  PSYCH: Pleasant and cooperative. Speech normal rate and rhythm. Affect is appropriate. Insight  and judgement are appropriate. Attention is focused, linear, and appropriate.  NEURO: Oriented  as arrived to appointment on time with no prompting.   Lab Results  Component Value Date   CREATININE 0.83 12/24/2019   BUN 15 12/24/2019   NA 145 (H) 12/24/2019   K 4.2 12/24/2019   CL 104 12/24/2019   CO2 28 12/24/2019   Lab Results  Component Value Date   ALT 19 12/24/2019   AST 16 12/24/2019   ALKPHOS 88 12/24/2019   BILITOT 0.3 12/24/2019   Lab Results  Component Value Date   HGBA1C 5.5 12/24/2019   HGBA1C 5.8 (H) 09/17/2019   HGBA1C 6.0 (H) 08/14/2018   HGBA1C 5.9 (H) 02/09/2015   Lab Results  Component Value Date   INSULIN 19.6 12/24/2019   INSULIN 23.3 09/17/2019   INSULIN 22.3 05/01/2019   Lab Results  Component Value Date   TSH 1.507 02/09/2015   Lab Results  Component Value Date   CHOL 147 09/17/2019   HDL 48 09/17/2019   LDLCALC 84 09/17/2019   TRIG 76 09/17/2019   CHOLHDL 4.6 02/09/2015   Lab Results  Component Value Date   WBC 8.1 01/27/2019   HGB 13.0 01/27/2019   HCT 41.6 01/27/2019   MCV 88.5 01/27/2019   PLT 279 01/27/2019   Attestation Statements:   Reviewed by clinician on day of visit: allergies, medications, problem list, medical history, surgical history, family history, social history, and previous encounter notes.  Time spent on visit including pre-visit chart review and post-visit charting and care was 20 minutes.   I, Water quality scientist, CMA, am acting as Location manager for [employer], DO  I have reviewed the above documentation for accuracy and completeness, and I agree with the above. Jearld Lesch, DO

## 2020-02-13 ENCOUNTER — Encounter (INDEPENDENT_AMBULATORY_CARE_PROVIDER_SITE_OTHER): Payer: Self-pay | Admitting: Bariatrics

## 2020-02-25 ENCOUNTER — Encounter (HOSPITAL_COMMUNITY): Payer: Self-pay

## 2020-02-25 ENCOUNTER — Ambulatory Visit (HOSPITAL_COMMUNITY): Payer: HMO

## 2020-02-25 ENCOUNTER — Other Ambulatory Visit: Payer: Self-pay

## 2020-02-25 ENCOUNTER — Ambulatory Visit (INDEPENDENT_AMBULATORY_CARE_PROVIDER_SITE_OTHER): Payer: HMO

## 2020-02-25 ENCOUNTER — Ambulatory Visit (HOSPITAL_COMMUNITY)
Admission: EM | Admit: 2020-02-25 | Discharge: 2020-02-25 | Disposition: A | Discharge status: Home / Self Care | Payer: HMO | Attending: Family Medicine | Admitting: Family Medicine

## 2020-02-25 DIAGNOSIS — M25531 Pain in right wrist: Secondary | ICD-10-CM

## 2020-02-25 DIAGNOSIS — M79644 Pain in right finger(s): Secondary | ICD-10-CM

## 2020-02-25 DIAGNOSIS — S6991XA Unspecified injury of right wrist, hand and finger(s), initial encounter: Secondary | ICD-10-CM | POA: Diagnosis not present

## 2020-02-25 DIAGNOSIS — M79641 Pain in right hand: Secondary | ICD-10-CM | POA: Diagnosis not present

## 2020-02-25 NOTE — ED Triage Notes (Signed)
Pt presents with pain and soreness in right thumb and right wrist x 1 day. States she injured the right thumb with a mouse trap yesterday, and hurt it again today when giving a bath to her husband.

## 2020-02-25 NOTE — Discharge Instructions (Addendum)
Your x ray was normal Most likely bad bruise.  Rest, Ice elevate.  Ibuprofen or tylenol for pain as needed.  Splint for comfort.  Follow up as needed for continued or worsening symptoms

## 2020-02-26 NOTE — ED Provider Notes (Signed)
Roderic Palau    CSN: [id number] Arrival date & time: 02/25/20  1946      History   Chief Complaint Chief Complaint  Patient presents with  . Finger Injury    HPI Anna Walker is a 65 y.o. female.   Patient is a 65 year old female that presents today with right thumb injury.  Bruising and swelling to the right thumb.  Reporting that she closed the thumb in a mouse trap.  She then hit the thumb when she was giving her husband a bath.  Decreased range of motion.       Past Medical History:  Diagnosis Date  . ADD (attention deficit disorder)   . Anxiety   . Biliary colic   . Bladder leak   . Diabetes mellitus without complication (HCC)    type 2 , diet controlled   . Diverticular disease   . DVT (deep venous thrombosis) (HCC)    left leg , resolved after treatment of blood thinners   . Edema of both lower extremities   . GERD (gastroesophageal reflux disease)    causes coughing   . Glaucoma   . High cholesterol   . Hypertension   . Hypothyroid   . Leg cramps    "in my thighs"   . Leg cramps   . Migraines   . Multiple sclerosis ([place])    per patient " that ha sbeen ruled out , they though it was that initially but it did not preogress like it"  . Optic neuritis    lesion on right optic nerve; right eye only , reduced vision and blurriness , loss of depth perception, glasses do not help when it flares    . OSA (obstructive sleep apnea)    nightly cpap   . Paresthesias/numbness    fingers and toes   . Pneumonia 2017  . Restless leg syndrome   . Sleep apnea   . SOB (shortness of breath)   . Thyroid disease    Hypothyroidism    Patient Active Problem List   Diagnosis Date Noted  . Vitamin D deficiency 05/02/2019  . Morbid obesity ([place]) 05/01/2019  . Chronic pain of both knees 04/25/2017  . DOE (dyspnea on exertion)   . Substernal chest pain 02/08/2015  . Attention deficit disorder 12/22/2014  . History of optic neuritis 06/20/2014  . OSA on  CPAP 06/20/2014  . Restless leg syndrome 06/20/2014  . Other fatigue 06/20/2014  . Migraine without aura and without status migrainosus, not intractable 06/20/2014  . possible MS 02/24/2014  . Hypothyroidism 02/24/2014  . GERD (gastroesophageal reflux disease) 02/24/2014    Past Surgical History:  Procedure Laterality Date  . ABDOMINAL HYSTERECTOMY    . BREAST LUMPECTOMY Left   . CESAREAN SECTION     x2   . CHOLECYSTECTOMY N/A 08/16/2018   Procedure: LAPAROSCOPIC CHOLECYSTECTOMY;  Surgeon: Greer Pickerel, MD;  Location: [facility];  Service: General;  Laterality: N/A;  . COLONOSCOPY WITH PROPOFOL N/A 01/03/2019   Procedure: COLONOSCOPY WITH PROPOFOL;  Surgeon: Wonda Horner, MD;  Location: [facility];  Service: Endoscopy;  Laterality: N/A;  . MENISCUS REPAIR Left left  . MENISCUS REPAIR Right    knee   . TUBAL LIGATION     during 2nd c section     OB History    Gravida  2   Para  2   Term      Preterm      AB  Living        SAB      IAB      Ectopic      Multiple      Live Births               Home Medications    Prior to Admission medications   Medication Sig Start Date End Date Taking? Authorizing Provider  acetaminophen (TYLENOL) 500 MG tablet Take 500 mg by mouth every 6 (six) hours as needed.    [provider]  amantadine (SYMMETREL) 100 MG capsule TAKE 1 CAPSULE(100 MG) BY MOUTH THREE TIMES DAILY 11/25/19   Everlena CooperJaffe, Adam R, DO  ASPERCREME LIDOCAINE EX Apply topically.    [provider]  aspirin 81 MG EC tablet Take 81 mg by mouth daily. Swallow whole.    [provider]  cyclobenzaprine (FLEXERIL) 5 MG tablet Take 5 mg by mouth 3 (three) times daily as needed. 01/06/20   [provider]  diclofenac (VOLTAREN) 75 MG EC tablet Take 75 mg by mouth 2 (two) times daily. 01/06/20   [provider]  FLUoxetine (PROZAC) 10 MG capsule Take 10 mg by mouth at bedtime. Take with 20 mg dose 03/19/17   [provider]  FLUoxetine (PROZAC) 20 MG capsule Take 20 mg by mouth at bedtime. Take with 10 mg dose 06/21/18   [provider]  gabapentin (NEURONTIN) 300 MG capsule [facility] AND 2 CAPSULES AT BEDTIME 04/29/19   Everlena CooperJaffe, Adam R, DO  hydrochlorothiazide (HYDRODIURIL) 12.5 MG tablet Take 12.5 mg by mouth daily.  02/06/17   [provider]  levothyroxine (SYNTHROID, LEVOTHROID) 150 MCG tablet Take 150 mcg by mouth daily before breakfast.    [provider]  omeprazole (PRILOSEC) 40 MG capsule Take 1 capsule (40 mg total) by mouth 2 (two) times daily before a meal. 02/10/15   Vassie LollMadera, Carlos, MD  oxybutynin (DITROPAN) 5 MG tablet Take 5 mg by mouth at bedtime.     [provider]  rOPINIRole (REQUIP) 3 MG tablet TAKE 1 TABLET BY MOUTH AT DINNER AND 1 TABLET AT BEDTIME. PLEASE CALL [phone] TO SCHEDULE AN APPOINTMENT 11/25/19   Drema DallasJaffe, Adam R, DO  simvastatin (ZOCOR) 10 MG tablet Take 10 mg by mouth every evening.     [provider]  SUMAtriptan (IMITREX) 100 MG tablet Take 1 tablet (100 mg total) by mouth once as needed for migraine. May repeat in 2 hours if headache persists or recurs. 09/21/18   Drema DallasJaffe, Adam R, DO  Vitamin D, Ergocalciferol, (DRISDOL) 1.25 MG (50000 UNIT) CAPS capsule Take 1 capsule (50,000 Units total) by mouth every 7 (seven) days. 02/04/20   [place] NickelBrown, Angel A, DO    Family History Family History  Problem Relation Age of Onset  . Barrett's esophagus Mother   . Hypertension Mother   . Obesity Mother   . Graves' disease Sister   . Rheum arthritis Sister   . Heart attack Father 1749  . Sudden death Father   . Coronary artery disease Neg Hx     Social History Social History   Tobacco Use  . Smoking status: Former Smoker    Quit date: 01/25/1984    Years since quitting: 36.1  . Smokeless tobacco: Never Used  Vaping Use  . Vaping Use: Never used  Substance Use Topics  . Alcohol use: Not Currently  . Drug use: No      Allergies   Adderall [amphetamine-dextroamphetamine]  and Topamax [topiramate]   Review of Systems Review of Systems   Physical Exam Triage Vital Signs ED Triage Vitals  Enc Vitals Group     BP 02/25/20 2017 116/60     Pulse Rate 02/25/20 2017 71     Resp 02/25/20 2017 18     Temp 02/25/20 2017 98.3 F (36.8 C)     Temp Source 02/25/20 2017 Oral     SpO2 02/25/20 2017 98 %     Weight --      Height --      Head Circumference --      Peak Flow --      Pain Score 02/25/20 2016 8     Pain Loc --      Pain Edu? --      Excl. in [place]? --    No data found.  Updated Vital Signs BP 116/60 (BP Location: Left Arm)   Pulse 71   Temp 98.3 F (36.8 C) (Oral)   Resp 18   SpO2 98%   Visual Acuity Right Eye Distance:   Left Eye Distance:   Bilateral Distance:    Right Eye Near:   Left Eye Near:    Bilateral Near:     Physical Exam Vitals and nursing note reviewed.  Constitutional:      General: She is not in acute distress.    Appearance: Normal appearance. She is not ill-appearing, toxic-appearing or diaphoretic.  HENT:     Head: Normocephalic.  Eyes:     Conjunctiva/sclera: Conjunctivae normal.  Pulmonary:     Effort: Pulmonary effort is normal.  Musculoskeletal:        General: Normal range of motion.       Hands:     Cervical back: Normal range of motion.     Comments: Mild swelling and bruising. Decreased ROM.   Skin:    General: Skin is warm and dry.     Findings: No rash.  Neurological:     Mental Status: She is alert.  Psychiatric:        Mood and Affect: Mood normal.      UC Treatments / Results  Labs (all labs ordered are listed, but only abnormal results are displayed) Labs Reviewed - No data to display  EKG   Radiology DG Hand Complete Right  Result Date: 02/25/2020 CLINICAL DATA:  Soreness the right thumb and right wrist x1 day. EXAM: RIGHT HAND - COMPLETE 3+ VIEW COMPARISON:  December 15, 2011 FINDINGS: There is no evidence of  fracture or dislocation. There is no evidence of arthropathy or other focal bone abnormality. Soft tissues are unremarkable. IMPRESSION: Negative. Electronically Signed   By: Constance Holster M.D.   On: 02/25/2020 20:32    Procedures Procedures (including critical care time)  Medications Ordered in UC Medications - No data to display  Initial Impression / Assessment and Plan / UC Course  I have reviewed the triage vital signs and the nursing notes.  Pertinent labs & imaging results that were available during my care of the patient were reviewed by me and considered in my medical decision making (see chart for details).     Right hand injury, thumb X-ray without any acute fractures. Most likely bad bruise.  Recommended rest, ice, elevate.  Ibuprofen or Tylenol for pain as needed Splint for comfort Follow up as needed for continued or worsening symptoms  Final Clinical Impressions(s) / UC Diagnoses   Final diagnoses:  Injury of finger of right  hand, initial encounter     Discharge Instructions     Your x ray was normal Most likely bad bruise.  Rest, Ice elevate.  Ibuprofen or tylenol for pain as needed.  Splint for comfort.  Follow up as needed for continued or worsening symptoms     ED Prescriptions    None     PDMP not reviewed this encounter.   Loura Halt A, NP 02/26/20 [phone]

## 2020-03-12 ENCOUNTER — Other Ambulatory Visit: Payer: Self-pay

## 2020-03-12 ENCOUNTER — Encounter (INDEPENDENT_AMBULATORY_CARE_PROVIDER_SITE_OTHER): Payer: Self-pay | Admitting: Bariatrics

## 2020-03-12 ENCOUNTER — Ambulatory Visit (INDEPENDENT_AMBULATORY_CARE_PROVIDER_SITE_OTHER): Payer: HMO | Admitting: Bariatrics

## 2020-03-12 VITALS — BP 133/83 | HR 77 | Temp 98.0°F | Ht 65.0 in | Wt 266.0 lb

## 2020-03-12 DIAGNOSIS — E559 Vitamin D deficiency, unspecified: Secondary | ICD-10-CM | POA: Diagnosis not present

## 2020-03-12 DIAGNOSIS — I10 Essential (primary) hypertension: Secondary | ICD-10-CM | POA: Diagnosis not present

## 2020-03-12 DIAGNOSIS — Z6841 Body Mass Index (BMI) 40.0 and over, adult: Secondary | ICD-10-CM

## 2020-03-12 MED ORDER — VITAMIN D (ERGOCALCIFEROL) 1.25 MG (50000 UNIT) PO CAPS: 50000.0000 [IU] | ORAL_CAPSULE | ORAL | 0 refills | Status: DC

## 2020-03-17 NOTE — Progress Notes (Unsigned)
Chief Complaint:   OBESITY Anna Walker is here to discuss her progress with her obesity treatment plan along with follow-up of her obesity related diagnoses. Anna Walker is on the Category 3 Plan and states she is following her eating plan approximately 25% of the time. Anna Walker states she is doing 0 minutes 0 times per week.  Today's visit was #: 15 Starting weight: 277 lbs Starting date: 05/01/2019 Today's weight: 266 lbs Today's date: 03/12/2020 Total lbs lost to date: 11 Total lbs lost since last in-office visit: 0  Interim History: Anna Walker is up 6 lbs since her last visit. She went "off the train".  Subjective:   1. Vitamin D deficiency Anna Walker is on prescription Vit D, and she denies nausea, vomiting, or muscle weakness.  2. Essential hypertension Anna Walker is taking hydrochlorothiazide. Her last few blood pressure have been 116/60 and 117/72. Her blood pressure today is 133/83, not as well controlled.  Assessment/Plan:   1. Vitamin D deficiency Low Vitamin D level contributes to fatigue and are associated with obesity, breast, and colon cancer. We will refill prescription Vitamin D for 1 month. Anna Walker will follow-up for routine testing of Vitamin D, at least 2-3 times per year to avoid over-replacement.  - Vitamin D, Ergocalciferol, (DRISDOL) 1.25 MG (50000 UNIT) CAPS capsule; Take 1 capsule (50,000 Units total) by mouth every 7 (seven) days.  Dispense: 4 capsule; Refill: 0  2. Essential hypertension Anna Walker will continue taking hydrochlorothiazide, and will continue working on healthy weight loss and exercise to improve blood pressure control. We will watch for signs of hypotension as she continues her lifestyle modifications. She check her blood pressure daily, and will call her primary care physician if her blood pressure remains high.  3. Class 3 severe obesity due to excess calories with serious comorbidity and body mass index (BMI) of 40.0 to 44.9 in adult  Anna Walker) Anna Walker is currently in the action stage of change. As such, her goal is to continue with weight loss efforts. She has agreed to the Category 3 Plan.   We discussed mindful eating. Recipe and Recipes #2 was given today.  Exercise goals: No exercise has been prescribed at this time.  Behavioral modification strategies: increasing lean protein intake, decreasing simple carbohydrates, increasing vegetables, increasing water intake, decreasing eating out, meal planning and cooking strategies, keeping healthy foods in the home, ways to avoid boredom eating, ways to avoid night time snacking, better snacking choices and emotional eating strategies.  Anna Walker has agreed to follow-up with our clinic in 2 to 3 weeks. She was informed of the importance of frequent follow-up visits to maximize her success with intensive lifestyle modifications for her multiple health conditions.   Objective:   Blood pressure 133/83, pulse 77, temperature 98 F (36.7 C), height 5\' 5"  (1.651 m), weight 266 lb (120.7 kg), SpO2 96 %. Body mass index is 44.26 kg/m.  General: Cooperative, alert, well developed, in no acute distress. HEENT: Conjunctivae and lids unremarkable. Cardiovascular: Regular rhythm.  Lungs: Normal work of breathing. Neurologic: No focal deficits.   Lab Results  Component Value Date   CREATININE 0.83 12/24/2019   BUN 15 12/24/2019   NA 145 (H) 12/24/2019   K 4.2 12/24/2019   CL 104 12/24/2019   CO2 28 12/24/2019   Lab Results  Component Value Date   ALT 19 12/24/2019   AST 16 12/24/2019   ALKPHOS 88 12/24/2019   BILITOT 0.3 12/24/2019   Lab Results  Component Value Date   HGBA1C  5.5 12/24/2019   HGBA1C 5.8 (H) 09/17/2019   HGBA1C 6.0 (H) 08/14/2018   HGBA1C 5.9 (H) 02/09/2015   Lab Results  Component Value Date   INSULIN 19.6 12/24/2019   INSULIN 23.3 09/17/2019   INSULIN 22.3 05/01/2019   Lab Results  Component Value Date   TSH 1.507 02/09/2015   Lab Results   Component Value Date   CHOL 147 09/17/2019   HDL 48 09/17/2019   LDLCALC 84 09/17/2019   TRIG 76 09/17/2019   CHOLHDL 4.6 02/09/2015   Lab Results  Component Value Date   WBC 8.1 01/27/2019   HGB 13.0 01/27/2019   HCT 41.6 01/27/2019   MCV 88.5 01/27/2019   PLT 279 01/27/2019   No results found for: IRON, TIBC, FERRITIN  Obesity Behavioral Intervention:   Approximately 15 minutes were spent on the discussion below.  ASK: We discussed the diagnosis of obesity with Anna Walker today and Anna Walker agreed to give [place] permission to discuss obesity behavioral modification therapy today.  ASSESS: Anna Walker has the diagnosis of obesity and her BMI today is 44.26. Anna Walker is in the action stage of change.   ADVISE: Anna Walker was educated on the multiple health risks of obesity as well as the benefit of weight loss to improve her health. She was advised of the need for long term treatment and the importance of lifestyle modifications to improve her current health and to decrease her risk of future health problems.  AGREE: Multiple dietary modification options and treatment options were discussed and Anna Walker agreed to follow the recommendations documented in the above note.  ARRANGE: Anna Walker was educated on the importance of frequent visits to treat obesity as outlined per CMS and USPSTF guidelines and agreed to schedule her next follow up appointment today.  Attestation Statements:   Reviewed by clinician on day of visit: allergies, medications, problem list, medical history, surgical history, family history, social history, and previous encounter notes.   Wilhemena Durie, am acting as Location manager for [employer], DO.  I have reviewed the above documentation for accuracy and completeness, and I agree with the above. Jearld Lesch, DO

## 2020-03-18 ENCOUNTER — Encounter (INDEPENDENT_AMBULATORY_CARE_PROVIDER_SITE_OTHER): Payer: Self-pay | Admitting: Bariatrics

## 2020-03-25 NOTE — Progress Notes (Signed)
NEUROLOGY FOLLOW UP OFFICE NOTE  [name] [id number]  Assessment/Plan:   1.  Migraine with aura, without status migrainosus, not intractable 2.  Recurrent symptoms of right-sided heaviness/numbness, likely complicated migraine. He has history of of right optic neuritis, but based on testing that has been performed, I do not believe Ms. Borrayo has multiple sclerosis. CSF analysis revealed 1 oligoclonal band, which is not a significant number. She has had repeated MRIs over the years with minimal (3 ) tiny hyperintense foci in the periventricular white matter which have been stable. Given her longstanding history with recurrent flare-ups, I would expect radiographic evidence of disease progression on MRI. She also does not exhibit abnormal findings in other regions of the CNS, such as supratentorial region or spinal cord. 3.  Restless leg syndrome  1.  Migraine prevention:  Gabapentin 600mg  at bedtime 2.  Migraine rescue:  Sumatriptan 3.  RLS management:  Ropinirole 3mg  at dinner if needed and 3mg  at bedtime; gabapentin 600mg  at bedtime (300mg  in evening if needed) 4.  Limit use of pain relievers to no more than 2 days out of week to prevent risk of rebound or medication-overuse headache. 5.  Keep headache diary 6.  Follow up 9 months  Subjective:  Anna Walker is a 65 year old woman with ADD, fatigue, hypertension, restless leg syndrome, migraines, hyperlipidemia, type 2 diabetes mellitus, OSA, hypothyroidism and history of right optic neuritis who follows up for migraines and RLS.Marland Kitchen  UPDATE: Migraines are controlled.  They are mild, last a few hours.  Occurred 2 in past month.  Takes Tylenol and cold compress   She still has episodes of dizziness/spinning and feels shaky.  They last a few minutes and occur multiple times a week.  Occurs when looking up but can also occur while sitting in the recliner watching TV  Restless leg is controlled. Takes gabapentin 600mg  at  bedtime and ropinirole 3mg  at dinner and at bedtime. If needed, she takes 300mg  gabapentin earlier in the evening.  Current NSAIDS: ASA 81mg  daily Current analgesics:no Current triptans: sumatriptan 100mg  Current anti-emetic:no Current muscle relaxants:no Current anti-anxiolytic:no Current sleep aide:no Current Antihypertensive medications: HCTZ Current Antidepressant medications: fluoxetine 30mg  Current Anticonvulsant medications: gabapentin 300mg  eveningPRNand 600mg  at bedtime Other medications: Ropinirole 3mg  dinner and 3mg  bedtime, amantadine 100mg  three times daily(fatigue)   HISTORY: I RIGHT OPTIC NEURITISAND OTHER RECURRENT SYMPTOMS WITH QUESTIONABLE MULTIPLE SCLEROSIS: She had an episode of right optic neuritis in 1998, presenting first as blurred vision and then complete vision loss.A couple of weeks later, she developed right sided numbness of the face, arm and leg.She underwent a lumbar puncture which demonstrated one oligoclonal band. MRI of brain reportedly may have shown foci which may be concerning for MS. MRI cervical spine reportedly did not demonstrate demyelinating lesions. She was diagnosed with multiple sclerosis and treatedwith IV steroids. The vision loss lasted 7-8 weeks. The numbness lasted several days. She was treated with DMT for several years. She started on Betaseron but was then switched to Copaxone and then Avonex due to intolerability. She had a repeat LP in 2002 which showed no oligoclonal bands and normal IgG index of 0.6. Visual evoked potential in 2013 showed absent right response and normal left response. Repeat MRI from July 2014 was thought to be essentially normal so Avonex was discontinued. Repeat MRI in November 2014 was unchanged. Therefore, it was believed that she did not have MS. NMO-IgG antibodies from 02/24/14 were negative.MRI of cervical spine without contrast from 03/24/16 was personally  reviewed and revealed  cervical spondylosis but no abnormal cord signal.  Since the episode in 1998, she endorses subtle weakness on the right side of her body. For example, when she starts to write, her handwriting will start to get worse. She also reports short-term memory deficits since 1998. Over the years, she has had recurrent episodes of right eye pain with right sided numbness. In the past, they were thought to be MS flares which were treated with steroids. They occur when she is heated or fatigued. They typically occur at least once a year and they last about 2 days. She last had an episode in early January 2019, for which she was evaluated in the ED on 01/30/17. She had an MRI of the brain and orbits with and without contrast, which was personally reviewed and demonstrated asymmetrically small right optic nerve compatible with sequelae of prior optic neuritis but no enhancement, as well as three nonspecific tiny hyperintense T2/FLAIR foci in the bifrontal periventricular white matter.All of her symptoms have not progressed over the years and recurrence of these episodes have not increased since discontinuation of Avonex.  She was seen in the ED on 08/27/18 with1 day ofbilateral blurred vision, of gradual onset. It was monocular in either eye. She first noticed it because she had trouble driving. She had some right eye ache and dizziness but no headache or new numbness and tinglingMRI of brain/orbits and cervical spine with and without contrast appeared stable and showed no active demyelinating lesions or abnormal signal of optic nerves. She was diagnosed by in-house neurology with migraine and was treated with migraine cocktail of toradol and Reglan. She doesn't have vertigo but she feels "insecure" and problems with balance when ambulating. Blurred visionwasunchanged. Shefollowed upwith her ophthalmologist,Dr. Gershon Crane. Eye exam was unremarkable except for early cataracts.   She has pain in the  legs and gait issues. She does have arthritis in the knees, which required surgery and had a subsequent DVT. She is treated for restless leg syndrome, for which she takes ropinirole and gabapentin.She has chronic burning in the hands.  She also reports chronic fatigue. She has OSA and is on CPAP. She takes amantadine.  II MIGRAINES She has history of migraines since early adulthood. They are severe bifrontal pain associated with nausea, photophobia and phonophobia but no vomiting, new visual disturbance or unilateral numbness or weakness. They last 1 to 2 days and usually occur once a year.There are no specific triggers or relieving factors however eye gets more blurred when fatigued or with drop in blood sugar.  She also reportsanother migraine described assevere sharpright retro-orbitalpain in September 2019. One one occasion, pain radiated into right ear and posterior neck.  Constant but fluctuates. There is associated blurred vision in right eye, dizziness,nausea, photophobia and phonophobia, eye twitching and fatigue. No associated right sided numbness and weakness.   Past medication: topiramate  PAST MEDICAL HISTORY: Past Medical History:  Diagnosis Date  . ADD (attention deficit disorder)   . Anxiety   . Biliary colic   . Bladder leak   . Diabetes mellitus without complication (HCC)    type 2 , diet controlled   . Diverticular disease   . DVT (deep venous thrombosis) (HCC)    left leg , resolved after treatment of blood thinners   . Edema of both lower extremities   . GERD (gastroesophageal reflux disease)    causes coughing   . Glaucoma   . High cholesterol   . Hypertension   .  Hypothyroid   . Leg cramps    "in my thighs"   . Leg cramps   . Migraines   . Multiple sclerosis ([place])    per patient " that ha sbeen ruled out , they though it was that initially but it did not preogress like it"  . Optic neuritis    lesion on right optic nerve; right eye  only , reduced vision and blurriness , loss of depth perception, glasses do not help when it flares    . OSA (obstructive sleep apnea)    nightly cpap   . Paresthesias/numbness    fingers and toes   . Pneumonia 2017  . Restless leg syndrome   . Sleep apnea   . SOB (shortness of breath)   . Thyroid disease    Hypothyroidism    MEDICATIONS: Current Outpatient Medications on File Prior to Visit  Medication Sig Dispense Refill  . acetaminophen (TYLENOL) 500 MG tablet Take 500 mg by mouth every 6 (six) hours as needed.    Marland Kitchen amantadine (SYMMETREL) 100 MG capsule TAKE 1 CAPSULE(100 MG) BY MOUTH THREE TIMES DAILY 270 capsule 3  . ASPERCREME LIDOCAINE EX Apply topically.    Marland Kitchen aspirin 81 MG EC tablet Take 81 mg by mouth daily. Swallow whole.    . cyclobenzaprine (FLEXERIL) 5 MG tablet Take 5 mg by mouth 3 (three) times daily as needed.    . diclofenac (VOLTAREN) 75 MG EC tablet Take 75 mg by mouth 2 (two) times daily.    Marland Kitchen FLUoxetine (PROZAC) 10 MG capsule Take 10 mg by mouth at bedtime. Take with 20 mg dose    . FLUoxetine (PROZAC) 20 MG capsule Take 20 mg by mouth at bedtime. Take with 10 mg dose    . gabapentin (NEURONTIN) 300 MG capsule TAKE 1 CAPSULE BY MOUTH AT DINNER AND 2 CAPSULES AT BEDTIME 270 capsule 4  . hydrochlorothiazide (HYDRODIURIL) 12.5 MG tablet Take 12.5 mg by mouth daily.     Marland Kitchen levothyroxine (SYNTHROID, LEVOTHROID) 150 MCG tablet Take 150 mcg by mouth daily before breakfast.    . omeprazole (PRILOSEC) 40 MG capsule Take 1 capsule (40 mg total) by mouth 2 (two) times daily before a meal. 60 capsule 1  . oxybutynin (DITROPAN) 5 MG tablet Take 5 mg by mouth at bedtime.     Marland Kitchen rOPINIRole (REQUIP) 3 MG tablet TAKE 1 TABLET BY MOUTH AT DINNER AND 1 TABLET AT BEDTIME. PLEASE CALL [phone] TO SCHEDULE AN APPOINTMENT 180 tablet 0  . simvastatin (ZOCOR) 10 MG tablet Take 10 mg by mouth every evening.     . SUMAtriptan (IMITREX) 100 MG tablet Take 1 tablet (100 mg total) by mouth once  as needed for migraine. May repeat in 2 hours if headache persists or recurs. 10 tablet 5  . Vitamin D, Ergocalciferol, (DRISDOL) 1.25 MG (50000 UNIT) CAPS capsule Take 1 capsule (50,000 Units total) by mouth every 7 (seven) days. 4 capsule 0   No current facility-administered medications on file prior to visit.    ALLERGIES: Allergies  Allergen Reactions  . Adderall [Amphetamine-Dextroamphetamine] Other (See Comments)    Caused elevated BP  . Topamax [Topiramate]     dizzy    FAMILY HISTORY: Family History  Problem Relation Age of Onset  . Barrett's esophagus Mother   . Hypertension Mother   . Obesity Mother   . Graves' disease Sister   . Rheum arthritis Sister   . Heart attack Father 3  . Sudden death Father   .  Coronary artery disease Neg Hx      Objective:  Blood pressure 138/80, pulse 83, height 5\' 6"  (1.676 m), weight 272 lb 3.2 oz (123.5 kg), SpO2 95 %. General: No acute distress.  Patient appears well-groomed.   Head:  Normocephalic/atraumatic Eyes:  Fundi examined but not visualized Neck: supple, no paraspinal tenderness, full range of motion Heart:  Regular rate and rhythm Lungs:  Clear to auscultation bilaterally Back: No paraspinal tenderness Neurological Exam: alert and oriented to person, place, and time. Attention span and concentration intact, recent and remote memory intact, fund of knowledge intact.  Speech fluent and not dysarthric, language intact.  CN II-XII intact. Bulk and tone normal, muscle strength 5/5 throughout.  Sensation to pinprick intact, vibratory sensation reduced in toes.  Deep tendon reflexes 1+ throughout, toes downgoing.  Finger to nose and heel to shin testing intact.  Gait steady, Romberg negative.     Metta Clines, DO  CC: Kelton Pillar, MD

## 2020-03-26 ENCOUNTER — Ambulatory Visit: Payer: HMO | Admitting: Neurology

## 2020-03-26 ENCOUNTER — Encounter: Payer: Self-pay | Admitting: Neurology

## 2020-03-26 ENCOUNTER — Other Ambulatory Visit: Payer: Self-pay

## 2020-03-26 VITALS — BP 138/80 | HR 83 | Ht 66.0 in | Wt 272.2 lb

## 2020-03-26 DIAGNOSIS — G2581 Restless legs syndrome: Secondary | ICD-10-CM | POA: Diagnosis not present

## 2020-03-26 DIAGNOSIS — G43109 Migraine with aura, not intractable, without status migrainosus: Secondary | ICD-10-CM | POA: Diagnosis not present

## 2020-03-26 NOTE — Patient Instructions (Signed)
1.  Gabapentin 600mg  at bedtime.  May take 300mg  in evening if possible 2.  Ropinirole 3mg  at bedtime.  May take 3mg  in evening if needed 3.  Sumatriptan if needed for migraine. 4.  Limit use of pain relievers to no more than 2 days out of week to prevent risk of rebound or medication-overuse headache. 5.  Keep headache diary 6.  Follow up one year

## 2020-03-31 ENCOUNTER — Ambulatory Visit: Payer: HMO | Admitting: Neurology

## 2020-03-31 ENCOUNTER — Encounter (INDEPENDENT_AMBULATORY_CARE_PROVIDER_SITE_OTHER): Payer: Self-pay | Admitting: Adult Health

## 2020-03-31 ENCOUNTER — Other Ambulatory Visit (INDEPENDENT_AMBULATORY_CARE_PROVIDER_SITE_OTHER): Payer: Self-pay | Admitting: Adult Health

## 2020-03-31 ENCOUNTER — Other Ambulatory Visit: Payer: Self-pay

## 2020-03-31 ENCOUNTER — Ambulatory Visit (INDEPENDENT_AMBULATORY_CARE_PROVIDER_SITE_OTHER): Payer: HMO | Admitting: Adult Health

## 2020-03-31 VITALS — BP 126/75 | HR 75 | Temp 97.8°F

## 2020-03-31 DIAGNOSIS — E559 Vitamin D deficiency, unspecified: Secondary | ICD-10-CM

## 2020-03-31 DIAGNOSIS — Z6841 Body Mass Index (BMI) 40.0 and over, adult: Secondary | ICD-10-CM

## 2020-03-31 DIAGNOSIS — I10 Essential (primary) hypertension: Secondary | ICD-10-CM | POA: Diagnosis not present

## 2020-03-31 DIAGNOSIS — R7303 Prediabetes: Secondary | ICD-10-CM

## 2020-03-31 MED ORDER — VITAMIN D (ERGOCALCIFEROL) 1.25 MG (50000 UNIT) PO CAPS: 50000.0000 [IU] | ORAL_CAPSULE | ORAL | 0 refills | Status: DC

## 2020-04-01 NOTE — Progress Notes (Signed)
Chief Complaint:   OBESITY Anna Walker is here to discuss her progress with her obesity treatment plan along with follow-up of her obesity related diagnoses. Anna Walker is on the Category 3 Plan and states she is following her eating plan approximately 60% of the time. Anna Walker states she is doing 0 minutes 0 times per week.  Today's visit was #: 74 Starting weight: 277 lbs Starting date: 05/01/2019 Today's weight: 265 lbs Today's date: 03/31/2020 Total lbs lost to date: 12 lbs Total lbs lost since last in-office visit: 1 lb  Interim History: Anna Walker continues to experience episodes of hypoglycemia. Her Accucheck Finger stick readings and Libre system BG readings will differ 20-30 points per same screening time. Finger stick is greater than [place] system.   Subjective:   1. Vitamin D deficiency Anna Walker's Vitamin D level was below goal of 50 at 31.9 on 12/24/2019. She is currently taking prescription vitamin D 50,000 IU each week. She denies nausea, vomiting or muscle weakness.  2. Pre-diabetes Anna Walker experienced symptoms of hypoglycemia over the weekend. She checked her blood glucose- finger stick was 70, however her [place] system was 53. She will feel symptoms of hypoglycemia when BG reaches/falls below 80.  She is not on any anti-diabetic agents.  Lab Results  Component Value Date   HGBA1C 5.5 12/24/2019   Lab Results  Component Value Date   INSULIN 19.6 12/24/2019   INSULIN 23.3 09/17/2019   INSULIN 22.3 05/01/2019    3. Essential hypertension Anna Walker's ambulatory readings range SBP 130-140's and DBP 60-70's.  BP Readings from Last 3 Encounters:  03/31/20 126/75  03/26/20 138/80  03/12/20 133/83    Assessment/Plan:   1. Vitamin D deficiency Low Vitamin D level contributes to fatigue and are associated with obesity, breast, and colon cancer. She agrees to continue to take prescription Vitamin D @50 ,000 IU every week and will follow-up for routine testing of  Vitamin D, at least 2-3 times per year to avoid over-replacement. Check labs at next OV.  - Vitamin D, Ergocalciferol, (DRISDOL) 1.25 MG (50000 UNIT) CAPS capsule; Take 1 capsule (50,000 Units total) by mouth every 7 (seven) days.  Dispense: 4 capsule; Refill: 0  2. Pre-diabetes Anna Walker will continue to work on weight loss, exercise, and decreasing simple carbohydrates to help decrease the risk of diabetes. Continue to monitor ambulatory BG. Eat small meals/snack every several hours while awake. Check labs at next OV.  3. Essential hypertension Anna Walker is working on healthy weight loss and exercise to improve blood pressure control. We will watch for signs of hypotension as she continues her lifestyle modifications. Check labs at next OV.  4. Class 3 severe obesity due to excess calories with serious comorbidity and body mass index (BMI) of 40.0 to 44.9 in adult [facility]) Anna Walker is currently in the action stage of change. As such, her goal is to continue with weight loss efforts. She has agreed to the Category 3 Plan.   Handout: Additional Breakfast Options. Fasting labs at next OV.  Change lunch and dinner timing.  Exercise goals: As is  Behavioral modification strategies: increasing lean protein intake, meal planning and cooking strategies and planning for success.  Anna Walker has agreed to follow-up with our clinic fasting in 3 weeks. She was informed of the importance of frequent follow-up visits to maximize her success with intensive lifestyle modifications for her multiple health conditions.   Objective:   Blood pressure 126/75, pulse 75, temperature 97.8 F (36.6 C), height (P) 5\' 6"  (1.676 m),  weight (P) 265 lb (120.2 kg), SpO2 98 %. Body mass index is 42.77 kg/m (pended).  General: Cooperative, alert, well developed, in no acute distress. HEENT: Conjunctivae and lids unremarkable. Cardiovascular: Regular rhythm.  Lungs: Normal work of breathing. Neurologic: No focal  deficits.   Lab Results  Component Value Date   CREATININE 0.83 12/24/2019   BUN 15 12/24/2019   NA 145 (H) 12/24/2019   K 4.2 12/24/2019   CL 104 12/24/2019   CO2 28 12/24/2019   Lab Results  Component Value Date   ALT 19 12/24/2019   AST 16 12/24/2019   ALKPHOS 88 12/24/2019   BILITOT 0.3 12/24/2019   Lab Results  Component Value Date   HGBA1C 5.5 12/24/2019   HGBA1C 5.8 (H) 09/17/2019   HGBA1C 6.0 (H) 08/14/2018   HGBA1C 5.9 (H) 02/09/2015   Lab Results  Component Value Date   INSULIN 19.6 12/24/2019   INSULIN 23.3 09/17/2019   INSULIN 22.3 05/01/2019   Lab Results  Component Value Date   TSH 1.507 02/09/2015   Lab Results  Component Value Date   CHOL 147 09/17/2019   HDL 48 09/17/2019   LDLCALC 84 09/17/2019   TRIG 76 09/17/2019   CHOLHDL 4.6 02/09/2015   Lab Results  Component Value Date   WBC 8.1 01/27/2019   HGB 13.0 01/27/2019   HCT 41.6 01/27/2019   MCV 88.5 01/27/2019   PLT 279 01/27/2019   No results found for: IRON, TIBC, FERRITIN  Obesity Behavioral Intervention:   Approximately 15 minutes were spent on the discussion below.  ASK: We discussed the diagnosis of obesity with Anna Walker today and Anna Walker agreed to give [place] permission to discuss obesity behavioral modification therapy today.  ASSESS: Anna Walker has the diagnosis of obesity and her BMI today is 44.1. Anna Walker is in the action stage of change.   ADVISE: Anna Walker was educated on the multiple health risks of obesity as well as the benefit of weight loss to improve her health. She was advised of the need for long term treatment and the importance of lifestyle modifications to improve her current health and to decrease her risk of future health problems.  AGREE: Multiple dietary modification options and treatment options were discussed and Anna Walker agreed to follow the recommendations documented in the above note.  ARRANGE: Anna Walker was educated on the importance of frequent  visits to treat obesity as outlined per CMS and USPSTF guidelines and agreed to schedule her next follow up appointment today.  Attestation Statements:   Reviewed by clinician on day of visit: allergies, medications, problem list, medical history, surgical history, family history, social history, and previous encounter notes.  Coral Ceo, am acting as Location manager for Mina Marble, NP.  I have reviewed the above documentation for accuracy and completeness, and I agree with the above. -  Ashonte Angelucci d. Beretta Ginsberg, NP-C

## 2020-04-09 DIAGNOSIS — I10 Essential (primary) hypertension: Secondary | ICD-10-CM | POA: Diagnosis not present

## 2020-04-09 DIAGNOSIS — E1169 Type 2 diabetes mellitus with other specified complication: Secondary | ICD-10-CM | POA: Diagnosis not present

## 2020-04-09 DIAGNOSIS — R5383 Other fatigue: Secondary | ICD-10-CM | POA: Diagnosis not present

## 2020-04-18 ENCOUNTER — Encounter (HOSPITAL_COMMUNITY): Payer: Self-pay | Admitting: Emergency Medicine

## 2020-04-18 ENCOUNTER — Emergency Department (HOSPITAL_BASED_OUTPATIENT_CLINIC_OR_DEPARTMENT_OTHER): Payer: HMO

## 2020-04-18 ENCOUNTER — Emergency Department (HOSPITAL_COMMUNITY)
Admission: EM | Admit: 2020-04-18 | Discharge: 2020-04-18 | Disposition: A | Discharge status: Home / Self Care | Payer: HMO | Attending: Emergency Medicine | Admitting: Emergency Medicine

## 2020-04-18 ENCOUNTER — Other Ambulatory Visit: Payer: Self-pay

## 2020-04-18 DIAGNOSIS — E039 Hypothyroidism, unspecified: Secondary | ICD-10-CM | POA: Insufficient documentation

## 2020-04-18 DIAGNOSIS — M5431 Sciatica, right side: Secondary | ICD-10-CM | POA: Diagnosis not present

## 2020-04-18 DIAGNOSIS — Z79899 Other long term (current) drug therapy: Secondary | ICD-10-CM | POA: Insufficient documentation

## 2020-04-18 DIAGNOSIS — M79605 Pain in left leg: Secondary | ICD-10-CM | POA: Diagnosis not present

## 2020-04-18 DIAGNOSIS — E119 Type 2 diabetes mellitus without complications: Secondary | ICD-10-CM | POA: Diagnosis not present

## 2020-04-18 DIAGNOSIS — I10 Essential (primary) hypertension: Secondary | ICD-10-CM | POA: Insufficient documentation

## 2020-04-18 DIAGNOSIS — Z87891 Personal history of nicotine dependence: Secondary | ICD-10-CM | POA: Insufficient documentation

## 2020-04-18 DIAGNOSIS — M79662 Pain in left lower leg: Secondary | ICD-10-CM

## 2020-04-18 DIAGNOSIS — M79661 Pain in right lower leg: Secondary | ICD-10-CM

## 2020-04-18 DIAGNOSIS — M79604 Pain in right leg: Secondary | ICD-10-CM | POA: Diagnosis not present

## 2020-04-18 DIAGNOSIS — Z7982 Long term (current) use of aspirin: Secondary | ICD-10-CM | POA: Insufficient documentation

## 2020-04-18 DIAGNOSIS — M5432 Sciatica, left side: Secondary | ICD-10-CM | POA: Diagnosis not present

## 2020-04-18 LAB — CBC WITH DIFFERENTIAL/PLATELET
Abs Immature Granulocytes: 0.03 10*3/uL (ref 0.00–0.07)
Basophils Absolute: 0 10*3/uL (ref 0.0–0.1)
Basophils Relative: 0 %
Eosinophils Absolute: 0.2 10*3/uL (ref 0.0–0.5)
Eosinophils Relative: 3 %
HCT: 36.8 % (ref 36.0–46.0)
Hemoglobin: 12 g/dL (ref 12.0–15.0)
Immature Granulocytes: 1 %
Lymphocytes Relative: 30 %
Lymphs Abs: 1.9 10*3/uL (ref 0.7–4.0)
MCH: 27.9 pg (ref 26.0–34.0)
MCHC: 32.6 g/dL (ref 30.0–36.0)
MCV: 85.6 fL (ref 80.0–100.0)
Monocytes Absolute: 0.5 10*3/uL (ref 0.1–1.0)
Monocytes Relative: 9 %
Neutro Abs: 3.6 10*3/uL (ref 1.7–7.7)
Neutrophils Relative %: 57 %
Platelets: 235 10*3/uL (ref 150–400)
RBC: 4.3 MIL/uL (ref 3.87–5.11)
RDW: 13.7 % (ref 11.5–15.5)
WBC: 6.2 10*3/uL (ref 4.0–10.5)
nRBC: 0 % (ref 0.0–0.2)

## 2020-04-18 LAB — COMPREHENSIVE METABOLIC PANEL
ALT: 18 U/L (ref 0–44)
AST: 16 U/L (ref 15–41)
Albumin: 3.5 g/dL (ref 3.5–5.0)
Alkaline Phosphatase: 65 U/L (ref 38–126)
Anion gap: 6 (ref 5–15)
BUN: 13 mg/dL (ref 8–23)
CO2: 28 mmol/L (ref 22–32)
Calcium: 8.8 mg/dL — ABNORMAL LOW (ref 8.9–10.3)
Chloride: 106 mmol/L (ref 98–111)
Creatinine, Ser: 0.78 mg/dL (ref 0.44–1.00)
GFR, Estimated: >60 mL/min (ref >60)
Glucose, Bld: 97 mg/dL (ref 70–99)
Potassium: 4.1 mmol/L (ref 3.5–5.1)
Sodium: 140 mmol/L (ref 135–145)
Total Bilirubin: 0.6 mg/dL (ref 0.3–1.2)
Total Protein: 6.3 g/dL — ABNORMAL LOW (ref 6.5–8.1)

## 2020-04-18 LAB — URINALYSIS, ROUTINE W REFLEX MICROSCOPIC
Bilirubin Urine: NEGATIVE
Glucose, UA: NEGATIVE mg/dL
Hgb urine dipstick: NEGATIVE
Ketones, ur: NEGATIVE mg/dL
Leukocytes,Ua: NEGATIVE
Nitrite: NEGATIVE
Protein, ur: NEGATIVE mg/dL
Specific Gravity, Urine: 1.005 (ref 1.005–1.030)
pH: 7 (ref 5.0–8.0)

## 2020-04-18 MED ORDER — HYDROCODONE-ACETAMINOPHEN 5-325 MG PO TABS: 1.0000 | ORAL_TABLET | ORAL | 0 refills | Status: DC | PRN

## 2020-04-18 MED ORDER — DEXAMETHASONE SODIUM PHOSPHATE 10 MG/ML IJ SOLN
10.0000 mg | Freq: Once | INTRAMUSCULAR | Status: AC
  Administered 2020-04-18: 10 mg via INTRAMUSCULAR
  Filled 2020-04-18: qty 1

## 2020-04-18 NOTE — ED Provider Notes (Addendum)
[place] EMERGENCY DEPARTMENT Provider Note   CSN: [id number] Arrival date & time: 04/18/20  1300     History Chief Complaint  Patient presents with  . Leg Pain    Anna Walker is a 65 y.o. female.  Pt presents to the ED today with bilateral leg pain.  She was helping put up a basketball net yesterday for her grandchildren.  This morning, she woke up with pain in both of her legs.  She has had a blood clot in the past and this feels similar.  Her blood clot was after knee surgery and she is no longer on blood thinners.  No bowel or bladder problems.  She is able to walk.  No sob or cp.        Past Medical History:  Diagnosis Date  . ADD (attention deficit disorder)   . Anxiety   . Biliary colic   . Bladder leak   . Diabetes mellitus without complication (HCC)    type 2 , diet controlled   . Diverticular disease   . DVT (deep venous thrombosis) (HCC)    left leg , resolved after treatment of blood thinners   . Edema of both lower extremities   . GERD (gastroesophageal reflux disease)    causes coughing   . Glaucoma   . High cholesterol   . Hypertension   . Hypothyroid   . Leg cramps    "in my thighs"   . Leg cramps   . Migraines   . Multiple sclerosis ([place])    per patient " that ha sbeen ruled out , they though it was that initially but it did not preogress like it"  . Optic neuritis    lesion on right optic nerve; right eye only , reduced vision and blurriness , loss of depth perception, glasses do not help when it flares    . OSA (obstructive sleep apnea)    nightly cpap   . Paresthesias/numbness    fingers and toes   . Pneumonia 2017  . Restless leg syndrome   . Sleep apnea   . SOB (shortness of breath)   . Thyroid disease    Hypothyroidism    Patient Active Problem List   Diagnosis Date Noted  . Prediabetes 03/31/2020  . Essential hypertension 03/31/2020  . Vitamin D deficiency 05/02/2019  . Class 3 severe obesity due to  excess calories with serious comorbidity and body mass index (BMI) of 40.0 to 44.9 in adult ([place]) 05/01/2019  . Chronic pain of both knees 04/25/2017  . DOE (dyspnea on exertion)   . Substernal chest pain 02/08/2015  . Attention deficit disorder 12/22/2014  . History of optic neuritis 06/20/2014  . OSA on CPAP 06/20/2014  . Restless leg syndrome 06/20/2014  . Other fatigue 06/20/2014  . Migraine without aura and without status migrainosus, not intractable 06/20/2014  . possible MS 02/24/2014  . Hypothyroidism 02/24/2014  . GERD (gastroesophageal reflux disease) 02/24/2014    Past Surgical History:  Procedure Laterality Date  . ABDOMINAL HYSTERECTOMY    . BREAST LUMPECTOMY Left   . CESAREAN SECTION     x2   . CHOLECYSTECTOMY N/A 08/16/2018   Procedure: LAPAROSCOPIC CHOLECYSTECTOMY;  Surgeon: Greer Pickerel, MD;  Location: [facility];  Service: General;  Laterality: N/A;  . COLONOSCOPY WITH PROPOFOL N/A 01/03/2019   Procedure: COLONOSCOPY WITH PROPOFOL;  Surgeon: Wonda Horner, MD;  Location: [facility] END[facility]COPY;  Service: Endoscopy;  Laterality: N/A;  . MENISCUS REPAIR  Left left  . MENISCUS REPAIR Right    knee   . TUBAL LIGATION     during 2nd c section      OB History    Gravida  2   Para  2   Term      Preterm      AB      Living        SAB      IAB      Ectopic      Multiple      Live Births              Family History  Problem Relation Age of Onset  . Barrett's esophagus Mother   . Hypertension Mother   . Obesity Mother   . Graves' disease Sister   . Rheum arthritis Sister   . Heart attack Father 72  . Sudden death Father   . Coronary artery disease Neg Hx     Social History   Tobacco Use  . Smoking status: Former Smoker    Quit date: 01/25/1984    Years since quitting: 36.2  . Smokeless tobacco: Never Used  Vaping Use  . Vaping Use: Never used  Substance Use Topics  . Alcohol use: Not Currently  . Drug use: No    Home Medications Prior to  Admission medications   Medication Sig Start Date End Date Taking? Authorizing Provider  HYDROcodone-acetaminophen (NORCO/VICODIN) 5-325 MG tablet Take 1 tablet by mouth every 4 (four) hours as needed. 04/18/20  Yes Isla Pence, MD  acetaminophen (TYLENOL) 500 MG tablet Take 500 mg by mouth every 6 (six) hours as needed.    [provider]  amantadine (SYMMETREL) 100 MG capsule TAKE 1 CAPSULE(100 MG) BY MOUTH THREE TIMES DAILY 11/25/19   Tomi Likens, Adam R, DO  ASPERCREME LIDOCAINE EX Apply topically.    [provider]  aspirin 81 MG EC tablet Take 81 mg by mouth daily. Swallow whole.    [provider]  cyclobenzaprine (FLEXERIL) 5 MG tablet Take 5 mg by mouth 3 (three) times daily as needed. 01/06/20   [provider]  diclofenac (VOLTAREN) 75 MG EC tablet Take 75 mg by mouth 2 (two) times daily. 01/06/20   [provider]  FLUoxetine (PROZAC) 10 MG capsule Take 10 mg by mouth at bedtime. Take with 20 mg dose 03/19/17   [provider]  FLUoxetine (PROZAC) 20 MG capsule Take 20 mg by mouth at bedtime. Take with 10 mg dose 06/21/18   [provider]  gabapentin (NEURONTIN) 300 MG capsule TAKE 1 CAPSULE BY MOUTH AT [facility] AND 2 CAPSULES AT BEDTIME 04/29/19   Tomi Likens, Adam R, DO  hydrochlorothiazide (HYDRODIURIL) 12.5 MG tablet Take 12.5 mg by mouth daily.  02/06/17   [provider]  levothyroxine (SYNTHROID, LEVOTHROID) 150 MCG tablet Take 150 mcg by mouth daily before breakfast.    [provider]  omeprazole (PRILOSEC) 40 MG capsule Take 1 capsule (40 mg total) by mouth 2 (two) times daily before a meal. 02/10/15   Barton Dubois, MD  oxybutynin (DITROPAN) 5 MG tablet Take 5 mg by mouth at bedtime.     [provider]  rOPINIRole (REQUIP) 3 MG tablet TAKE 1 TABLET BY MOUTH AT DINNER AND 1 TABLET AT BEDTIME. PLEASE CALL [phone] TO SCHEDULE AN APPOINTMENT 11/25/19   Pieter Partridge, DO  simvastatin (ZOCOR) 10 MG tablet  Take 10 mg by mouth every evening.     [provider]  SUMAtriptan (IMITREX) 100 MG tablet Take 1 tablet (100 mg total) by mouth once as needed for migraine. May repeat in 2 hours if headache persists or recurs. 09/21/18   Pieter Partridge, DO  Vitamin D, Ergocalciferol, (DRISDOL) 1.25 MG (50000 UNIT) CAPS capsule Take 1 capsule (50,000 Units total) by mouth every 7 (seven) days. 03/31/20   Mina Marble D, NP    Allergies    Adderall [amphetamine-dextroamphetamine] and Topamax [topiramate]  Review of Systems   Review of Systems  Musculoskeletal:       Bilateral leg pain  All other systems reviewed and are negative.   Physical Exam Updated Vital Signs BP (!) 114/55   Pulse 67   Temp 98.7 F (37.1 C) (Oral)   Resp 13   SpO2 96%   Physical Exam Vitals and nursing note reviewed.  Constitutional:      Appearance: Normal appearance.  HENT:     Head: Normocephalic and atraumatic.     Right Ear: External ear normal.     Left Ear: External ear normal.     Nose: Nose normal.     Mouth/Throat:     Mouth: Mucous membranes are moist.     Pharynx: Oropharynx is clear.  Eyes:     Extraocular Movements: Extraocular movements intact.     Conjunctiva/sclera: Conjunctivae normal.     Pupils: Pupils are equal, round, and reactive to light.  Cardiovascular:     Rate and Rhythm: Normal rate and regular rhythm.     Pulses: Normal pulses.     Heart sounds: Normal heart sounds.  Pulmonary:     Effort: Pulmonary effort is normal.     Breath sounds: Normal breath sounds.  Abdominal:     General: Abdomen is flat. Bowel sounds are normal.     Palpations: Abdomen is soft.  Musculoskeletal:        General: Normal range of motion.     Cervical back: Normal range of motion and neck supple.  Skin:    General: Skin is warm.     Capillary Refill: Capillary refill takes less than 2 seconds.  Neurological:     General: No focal deficit present.     Mental Status: She is alert and oriented to  person, place, and time.  Psychiatric:        Mood and Affect: Mood normal.        Behavior: Behavior normal.        Thought Content: Thought content normal.        Judgment: Judgment normal.     ED Results / Procedures / Treatments   Labs (all labs ordered are listed, but only abnormal results are displayed) Labs Reviewed  COMPREHENSIVE METABOLIC PANEL - Abnormal; Notable for the following components:      Result Value   Calcium 8.8 (*)    Total Protein 6.3 (*)    All other components within normal limits  URINALYSIS, ROUTINE W REFLEX MICROSCOPIC - Abnormal; Notable for the following components:   Color, Urine STRAW (*)    All other components within normal limits  CBC WITH DIFFERENTIAL/PLATELET    EKG None  Radiology No results found.  Procedures Procedures   Medications Ordered in ED Medications  dexamethasone (DECADRON) injection 10 mg (has no administration in time range)    ED Course  I have reviewed the triage vital signs and the nursing notes.  Pertinent labs & imaging results that were available during my care of the  patient were reviewed by me and considered in my medical decision making (see chart for details).    MDM Rules/Calculators/A&P                         I suspect pt's sx are sciatica, but will check labs and [place] to r/o DVT.  Labs are nl.  [place] neg for DVT.  Pt will be given a dose of decadron prior to d/c and lortab to take for pain.  She is given exercises to do.  She is to return if worse.  F/u with pcp.  Final Clinical Impression(s) / ED Diagnoses Final diagnoses:  Bilateral leg pain  Bilateral sciatica    Rx / DC Orders ED Discharge Orders         Ordered    HYDROcodone-acetaminophen (NORCO/VICODIN) 5-325 MG tablet  Every 4 hours PRN        04/18/20 1610           Isla Pence, MD 04/18/20 1513    Isla Pence, MD 04/18/20 1611

## 2020-04-18 NOTE — CV Procedure (Signed)
BLE venous duplex completed. Preliminary results given to [employer]. PA at 1540  Results can be found under chart review under CV PROC. 04/18/2020 4:28 PM Klark Vanderhoef RVT, RDMS

## 2020-04-18 NOTE — ED Triage Notes (Signed)
Pt. Stated, I woke this motning with both calf of my legs hurting. It might be a blood clot, Ive had a blood clot before.

## 2020-04-18 NOTE — ED Notes (Signed)
Patient discharge instructions reviewed with the patient. The patient verbalized understanding of instructions. Patient discharged. 

## 2020-04-20 ENCOUNTER — Other Ambulatory Visit: Payer: Self-pay

## 2020-04-20 ENCOUNTER — Encounter (INDEPENDENT_AMBULATORY_CARE_PROVIDER_SITE_OTHER): Payer: Self-pay | Admitting: Adult Health

## 2020-04-20 ENCOUNTER — Ambulatory Visit (INDEPENDENT_AMBULATORY_CARE_PROVIDER_SITE_OTHER): Payer: HMO | Admitting: Adult Health

## 2020-04-20 VITALS — BP 113/72 | HR 67 | Temp 98.0°F | Ht 66.0 in | Wt 267.0 lb

## 2020-04-20 DIAGNOSIS — I10 Essential (primary) hypertension: Secondary | ICD-10-CM

## 2020-04-20 DIAGNOSIS — R7303 Prediabetes: Secondary | ICD-10-CM | POA: Diagnosis not present

## 2020-04-20 DIAGNOSIS — E7849 Other hyperlipidemia: Secondary | ICD-10-CM

## 2020-04-20 DIAGNOSIS — Z6841 Body Mass Index (BMI) 40.0 and over, adult: Secondary | ICD-10-CM | POA: Diagnosis not present

## 2020-04-20 DIAGNOSIS — E559 Vitamin D deficiency, unspecified: Secondary | ICD-10-CM | POA: Diagnosis not present

## 2020-04-21 DIAGNOSIS — E7849 Other hyperlipidemia: Secondary | ICD-10-CM | POA: Insufficient documentation

## 2020-04-21 LAB — COMPREHENSIVE METABOLIC PANEL
ALT: 22 IU/L (ref 0–32)
AST: 14 IU/L (ref 0–40)
Albumin/Globulin Ratio: 1.6 (ref 1.2–2.2)
Albumin: 4.2 g/dL (ref 3.8–4.8)
Alkaline Phosphatase: 83 IU/L (ref 44–121)
BUN/Creatinine Ratio: 18 (ref 12–28)
BUN: 15 mg/dL (ref 8–27)
Bilirubin Total: 0.4 mg/dL (ref 0.0–1.2)
CO2: 23 mmol/L (ref 20–29)
Calcium: 8.7 mg/dL (ref 8.7–10.3)
Chloride: 109 mmol/L — ABNORMAL HIGH (ref 96–106)
Creatinine, Ser: 0.82 mg/dL (ref 0.57–1.00)
Globulin, Total: 2.6 g/dL (ref 1.5–4.5)
Glucose: 95 mg/dL (ref 65–99)
Potassium: 4.2 mmol/L (ref 3.5–5.2)
Sodium: 147 mmol/L — ABNORMAL HIGH (ref 134–144)
Total Protein: 6.8 g/dL (ref 6.0–8.5)
eGFR: 81 mL/min/{1.73_m2} (ref >59)

## 2020-04-21 LAB — LIPID PANEL
Chol/HDL Ratio: 2.9 ratio (ref 0.0–4.4)
Cholesterol, Total: 166 mg/dL (ref 100–199)
HDL: 57 mg/dL (ref >39)
LDL Chol Calc (NIH): 88 mg/dL (ref 0–99)
Triglycerides: 117 mg/dL (ref 0–149)
VLDL Cholesterol Cal: 21 mg/dL (ref 5–40)

## 2020-04-21 LAB — HEMOGLOBIN A1C
Est. average glucose Bld gHb Est-mCnc: 123 mg/dL
Hgb A1c MFr Bld: 5.9 % — ABNORMAL HIGH (ref 4.8–5.6)

## 2020-04-21 LAB — INSULIN, RANDOM: INSULIN: 16.3 u[IU]/mL (ref 2.6–24.9)

## 2020-04-21 LAB — VITAMIN D 25 HYDROXY (VIT D DEFICIENCY, FRACTURES): Vit D, 25-Hydroxy: 33.4 ng/mL (ref 30.0–100.0)

## 2020-04-21 NOTE — Progress Notes (Signed)
Chief Complaint:   OBESITY Anna Walker is here to discuss her progress with her obesity treatment plan along with follow-up of her obesity related diagnoses. Anna Walker is on the Category 3 Plan and states she is following her eating plan approximately 40-50% of the time. Anna Walker states she is doing 0 minutes 0 times per week.  Today's visit was #: 57 Starting weight: 277 lbs Starting date: 05/01/2019 Today's weight: 267 lbs Today's date: 04/20/2020 Total lbs lost to date: 10 lbs Total lbs lost since last in-office visit: 0  Interim History: Anna Walker has been busy with carpooling grandchildren and with appointments for herself and other family members. She states she has "been in the car a lot." She was seen at the ED 04/18/2020 for bilateral leg pain with bilateral sciatica. She was given injection of Decadron- reports decrease in pain.  Subjective:   1. Essential hypertension Anna Walker's BP/HR are stable in OV today. She is on HCTZ.  BP Readings from Last 3 Encounters:  04/20/20 113/72  04/18/20 114/67  03/31/20 126/75    2. Pre-diabetes Anna Walker is not on any anti-diabetic prescriptions. She experiences episodes of hypoglycemia. She has removed Libre BG system due to erroneous readings.  3. Vitamin D deficiency Anna Walker's Vitamin D level was 31.9 on 12/24/2019. She is currently taking prescription vitamin D 50,000 IU each week and tolerating it well. She denies nausea, vomiting or muscle weakness.   Ref. Range 12/24/2019 09:42  Vitamin D, 25-Hydroxy Latest Ref Range: 30.0 - 100.0 ng/mL 31.9   4. Other hyperlipidemia Anna Walker's 09/17/2019 labs resulted total 147, LDL 84, HDL 48, and triglycerides 76. She is on simvastatin 10 mg QD and tolerating it well.  Assessment/Plan:   1. Essential hypertension Anna Walker is working on healthy weight loss and exercise to improve blood pressure control. We will watch for signs of hypotension as she continues her lifestyle modifications. Check labs  today.  - Comprehensive metabolic panel  2. Pre-diabetes Anna Walker will continue to work on weight loss, exercise, and decreasing simple carbohydrates to help decrease the risk of diabetes. Check labs today.  - Hemoglobin A1c - Insulin, random  3. Vitamin D deficiency Low Vitamin D level contributes to fatigue and are associated with obesity, breast, and colon cancer. She agrees to continue to take prescription Vitamin D @50 ,000 IU every week and will follow-up for routine testing of Vitamin D, at least 2-3 times per year to avoid over-replacement. Check labs today.  - VITAMIN D 25 Hydroxy (Vit-D Deficiency, Fractures)  4. Other hyperlipidemia Cardiovascular risk and specific lipid/LDL goals reviewed.  We discussed several lifestyle modifications today and Anna Walker will continue to work on diet, exercise and weight loss efforts. Orders and follow up as documented in patient record. Check labs today.  Counseling Intensive lifestyle modifications are the first line treatment for this issue. . Dietary changes: Increase soluble fiber. Decrease simple carbohydrates. . Exercise changes: Moderate to vigorous-intensity aerobic activity 150 minutes per week if tolerated. . Lipid-lowering medications: see documented in medical record.  - Lipid panel  5. Class 3 severe obesity due to excess calories with serious comorbidity and body mass index (BMI) of 40.0 to 44.9 in adult [facility]) Anna Walker is currently in the action stage of change. As such, her goal is to continue with weight loss efforts. She has Walker to the Category 3 Plan.   Exercise goals: No exercise has been prescribed at this time.  Behavioral modification strategies: increasing lean protein intake, decreasing simple carbohydrates, no skipping meals,  meal planning and cooking strategies and planning for success.  Anna Walker has Walker to follow-up with our clinic in 2-3 weeks. She was informed of the importance of frequent follow-up  visits to maximize her success with intensive lifestyle modifications for her multiple health conditions.   Anna Walker was informed we would discuss her lab results at her next visit unless there is a critical issue that needs to be addressed sooner. Anna Walker Walker to keep her next visit at the Walker upon time to discuss these results.  Objective:   Blood pressure 113/72, pulse 67, temperature 98 F (36.7 C), height 5\' 6"  (1.676 m), weight 267 lb (121.1 kg), SpO2 99 %. Body mass index is 43.09 kg/m.  General: Cooperative, alert, well developed, in no acute distress. HEENT: Conjunctivae and lids unremarkable. Cardiovascular: Regular rhythm.  Lungs: Normal work of breathing. Neurologic: No focal deficits.   Lab Results  Component Value Date   CREATININE 0.82 04/20/2020   BUN 15 04/20/2020   NA 147 (H) 04/20/2020   K 4.2 04/20/2020   CL 109 (H) 04/20/2020   CO2 23 04/20/2020   Lab Results  Component Value Date   ALT 22 04/20/2020   AST 14 04/20/2020   ALKPHOS 83 04/20/2020   BILITOT 0.4 04/20/2020   Lab Results  Component Value Date   HGBA1C 5.9 (H) 04/20/2020   HGBA1C 5.5 12/24/2019   HGBA1C 5.8 (H) 09/17/2019   HGBA1C 6.0 (H) 08/14/2018   HGBA1C 5.9 (H) 02/09/2015   Lab Results  Component Value Date   INSULIN WILL FOLLOW 04/20/2020   INSULIN 19.6 12/24/2019   INSULIN 23.3 09/17/2019   INSULIN 22.3 05/01/2019   Lab Results  Component Value Date   TSH 1.507 02/09/2015   Lab Results  Component Value Date   CHOL 166 04/20/2020   HDL 57 04/20/2020   LDLCALC 88 04/20/2020   TRIG 117 04/20/2020   CHOLHDL 2.9 04/20/2020   Lab Results  Component Value Date   WBC 6.2 04/18/2020   HGB 12.0 04/18/2020   HCT 36.8 04/18/2020   MCV 85.6 04/18/2020   PLT 235 04/18/2020   No results found for: IRON, TIBC, FERRITIN  Obesity Behavioral Intervention:   Approximately 15 minutes were spent on the discussion below.  ASK: We discussed the diagnosis of obesity with  Anna Walker today and Anna Walker Walker to give [place] permission to discuss obesity behavioral modification therapy today.  ASSESS: Anna Walker has the diagnosis of obesity and her BMI today is 43.2. Anna Walker is in the action stage of change.   ADVISE: Anna Walker was educated on the multiple health risks of obesity as well as the benefit of weight loss to improve her health. She was advised of the need for long term treatment and the importance of lifestyle modifications to improve her current health and to decrease her risk of future health problems.  AGREE: Multiple dietary modification options and treatment options were discussed and Anna Walker to follow the recommendations documented in the above note.  ARRANGE: Anna Walker was educated on the importance of frequent visits to treat obesity as outlined per CMS and USPSTF guidelines and Walker to schedule her next follow up appointment today.  Attestation Statements:   Reviewed by clinician on day of visit: allergies, medications, problem list, medical history, surgical history, family history, social history, and previous encounter notes.  Coral Ceo, am acting as Location manager for Mina Marble, NP.  I have reviewed the above documentation for accuracy and completeness, and I agree with the above. -  Hady Niemczyk d. Kee Drudge, NP-C

## 2020-04-22 DIAGNOSIS — R202 Paresthesia of skin: Secondary | ICD-10-CM | POA: Diagnosis not present

## 2020-05-04 ENCOUNTER — Other Ambulatory Visit: Payer: Self-pay

## 2020-05-04 ENCOUNTER — Encounter (INDEPENDENT_AMBULATORY_CARE_PROVIDER_SITE_OTHER): Payer: Self-pay | Admitting: Adult Health

## 2020-05-04 ENCOUNTER — Ambulatory Visit (INDEPENDENT_AMBULATORY_CARE_PROVIDER_SITE_OTHER): Payer: HMO | Admitting: Adult Health

## 2020-05-04 VITALS — BP 119/68 | HR 71 | Temp 97.3°F | Ht 66.0 in | Wt 269.0 lb

## 2020-05-04 DIAGNOSIS — Z6841 Body Mass Index (BMI) 40.0 and over, adult: Secondary | ICD-10-CM | POA: Diagnosis not present

## 2020-05-04 DIAGNOSIS — E66813 Obesity, class 3: Secondary | ICD-10-CM

## 2020-05-04 DIAGNOSIS — E559 Vitamin D deficiency, unspecified: Secondary | ICD-10-CM | POA: Diagnosis not present

## 2020-05-04 DIAGNOSIS — R7303 Prediabetes: Secondary | ICD-10-CM | POA: Diagnosis not present

## 2020-05-04 DIAGNOSIS — E7849 Other hyperlipidemia: Secondary | ICD-10-CM

## 2020-05-05 NOTE — Progress Notes (Signed)
Chief Complaint:   OBESITY Anna Walker is here to discuss her progress with her obesity treatment plan along with follow-up of her obesity related diagnoses. Anna Walker is on the Category 3 Plan and states she is following her eating plan approximately 50% of the time. Anna Walker states she is biking 8 minutes 3 times per week.  Today's visit was #: 18 Starting weight: 277 lbs Starting date: 05/01/2019 Today's weight: 269 lbs Today's date: 05/04/2020 Total lbs lost to date: 8 lbs Total lbs lost since last in-office visit: 0  Interim History: Anna Walker ate off plan approximately 50% of the time and is up 2 lbs since last OV.  She has steadily been increasing exercise- stationary bike and yard work.  Subjective:   1. Vitamin D deficiency Discussed labs with patient today. Anna Walker's Vitamin D level was 33.4 on 04/20/2020, which is below goal of 50.Marland Kitchen She is currently taking prescription vitamin D 50,000 IU each week. She denies nausea, vomiting or muscle weakness.  2. Other hyperlipidemia Discussed labs with patient today. Anna Walker's 04/20/2020 lipid panel is at goal. She is on simvastatin 10 mg QD and tolerating well.  Lab Results  Component Value Date   ALT 22 04/20/2020   AST 14 04/20/2020   ALKPHOS 83 04/20/2020   BILITOT 0.4 04/20/2020   Lab Results  Component Value Date   CHOL 166 04/20/2020   HDL 57 04/20/2020   LDLCALC 88 04/20/2020   TRIG 117 04/20/2020   CHOLHDL 2.9 04/20/2020    3. Pre-diabetes Discussed labs with patient today. Anna Walker's 04/20/2020 BG 95, A1c 5.5 with elevated insulin level of 16.3. She has never been on Metformin.  Lab Results  Component Value Date   HGBA1C 5.9 (H) 04/20/2020   Lab Results  Component Value Date   INSULIN 16.3 04/20/2020   INSULIN 19.6 12/24/2019   INSULIN 23.3 09/17/2019   INSULIN 22.3 05/01/2019    Assessment/Plan:   1. Vitamin D deficiency Low Vitamin D level contributes to fatigue and are associated with  obesity, breast, and colon cancer. She agrees to continue to take prescription Vitamin D @50 ,000 IU every week and will follow-up for routine testing of Vitamin D, at least 2-3 times per year to avoid over-replacement.  2. Other hyperlipidemia Cardiovascular risk and specific lipid/LDL goals reviewed.  We discussed several lifestyle modifications today and Anna Walker will continue to work on diet, exercise and weight loss efforts. Orders and follow up as documented in patient record.   Counseling Intensive lifestyle modifications are the first line treatment for this issue. . Dietary changes: Increase soluble fiber. Decrease simple carbohydrates. . Exercise changes: Moderate to vigorous-intensity aerobic activity 150 minutes per week if tolerated. . Lipid-lowering medications: see documented in medical record.  3. Pre-diabetes Anna Walker will continue to work on weight loss, exercise, and decreasing simple carbohydrates to help decrease the risk of diabetes.   4. Class 3 severe obesity with serious comorbidity and body mass index (BMI) of 40.0 to 44.9 in adult, unspecified obesity type Anna Walker) Anna Walker is currently in the action stage of change. As such, her goal is to continue with weight loss efforts. She has agreed to the Category 3 Plan.    Handouts: Insulin Resistance and Pre-Diabetes.  Exercise goals: Recumbent Bike and yard work  Scientist, water quality modification strategies: increasing lean protein intake, meal planning and cooking strategies and planning for success.  Anna Walker has agreed to follow-up with our clinic in 3 weeks. She was informed of the importance of frequent follow-up visits  to maximize her success with intensive lifestyle modifications for her multiple health conditions.   Objective:   Blood pressure 119/68, pulse 71, temperature (!) 97.3 F (36.3 C), height 5\' 6"  (1.676 m), weight 269 lb (122 kg), SpO2 98 %. Body mass index is 43.42 kg/m.  General: Cooperative, alert,  well developed, in no acute distress. HEENT: Conjunctivae and lids unremarkable. Cardiovascular: Regular rhythm.  Lungs: Normal work of breathing. Neurologic: No focal deficits.   Lab Results  Component Value Date   CREATININE 0.82 04/20/2020   BUN 15 04/20/2020   NA 147 (H) 04/20/2020   K 4.2 04/20/2020   CL 109 (H) 04/20/2020   CO2 23 04/20/2020   Lab Results  Component Value Date   ALT 22 04/20/2020   AST 14 04/20/2020   ALKPHOS 83 04/20/2020   BILITOT 0.4 04/20/2020   Lab Results  Component Value Date   HGBA1C 5.9 (H) 04/20/2020   HGBA1C 5.5 12/24/2019   HGBA1C 5.8 (H) 09/17/2019   HGBA1C 6.0 (H) 08/14/2018   HGBA1C 5.9 (H) 02/09/2015   Lab Results  Component Value Date   INSULIN 16.3 04/20/2020   INSULIN 19.6 12/24/2019   INSULIN 23.3 09/17/2019   INSULIN 22.3 05/01/2019   Lab Results  Component Value Date   TSH 1.507 02/09/2015   Lab Results  Component Value Date   CHOL 166 04/20/2020   HDL 57 04/20/2020   LDLCALC 88 04/20/2020   TRIG 117 04/20/2020   CHOLHDL 2.9 04/20/2020   Lab Results  Component Value Date   WBC 6.2 04/18/2020   HGB 12.0 04/18/2020   HCT 36.8 04/18/2020   MCV 85.6 04/18/2020   PLT 235 04/18/2020   No results found for: IRON, TIBC, FERRITIN  Obesity Behavioral Intervention:   Approximately 15 minutes were spent on the discussion below.  ASK: We discussed the diagnosis of obesity with Benjamine Mola today and Gabbie agreed to give [place] permission to discuss obesity behavioral modification therapy today.  ASSESS: Renleigh has the diagnosis of obesity and her BMI today is 43.5. Lochlyn is in the action stage of change.   ADVISE: Naseem was educated on the multiple health risks of obesity as well as the benefit of weight loss to improve her health. She was advised of the need for long term treatment and the importance of lifestyle modifications to improve her current health and to decrease her risk of future health  problems.  AGREE: Multiple dietary modification options and treatment options were discussed and Marlon agreed to follow the recommendations documented in the above note.  ARRANGE: Joniyah was educated on the importance of frequent visits to treat obesity as outlined per CMS and USPSTF guidelines and agreed to schedule her next follow up appointment today.  Attestation Statements:   Reviewed by clinician on day of visit: allergies, medications, problem list, medical history, surgical history, family history, social history, and previous encounter notes.  Coral Ceo, am acting as Location manager for Mina Marble, NP.  I have reviewed the above documentation for accuracy and completeness, and I agree with the above. -  Oakley Kossman d. Mady Oubre, NP-C

## 2020-05-06 ENCOUNTER — Other Ambulatory Visit (INDEPENDENT_AMBULATORY_CARE_PROVIDER_SITE_OTHER): Payer: Self-pay | Admitting: Adult Health

## 2020-05-06 DIAGNOSIS — E559 Vitamin D deficiency, unspecified: Secondary | ICD-10-CM

## 2020-05-06 NOTE — Telephone Encounter (Signed)
Refill request

## 2020-05-27 ENCOUNTER — Other Ambulatory Visit (INDEPENDENT_AMBULATORY_CARE_PROVIDER_SITE_OTHER): Payer: Self-pay | Admitting: Adult Health

## 2020-05-27 ENCOUNTER — Ambulatory Visit (INDEPENDENT_AMBULATORY_CARE_PROVIDER_SITE_OTHER): Payer: HMO | Admitting: Adult Health

## 2020-05-27 ENCOUNTER — Encounter (INDEPENDENT_AMBULATORY_CARE_PROVIDER_SITE_OTHER): Payer: Self-pay | Admitting: Adult Health

## 2020-05-27 ENCOUNTER — Other Ambulatory Visit: Payer: Self-pay

## 2020-05-27 VITALS — BP 119/75 | HR 67 | Temp 97.8°F | Ht 66.0 in | Wt 268.0 lb

## 2020-05-27 DIAGNOSIS — R7303 Prediabetes: Secondary | ICD-10-CM

## 2020-05-27 DIAGNOSIS — E66813 Obesity, class 3: Secondary | ICD-10-CM

## 2020-05-27 DIAGNOSIS — Z6841 Body Mass Index (BMI) 40.0 and over, adult: Secondary | ICD-10-CM | POA: Diagnosis not present

## 2020-05-27 DIAGNOSIS — E559 Vitamin D deficiency, unspecified: Secondary | ICD-10-CM | POA: Diagnosis not present

## 2020-05-27 MED ORDER — VITAMIN D (ERGOCALCIFEROL) 1.25 MG (50000 UNIT) PO CAPS: 1.0000 | ORAL_CAPSULE | ORAL | 0 refills | Status: DC

## 2020-05-27 NOTE — Telephone Encounter (Signed)
Pt requests 90 day supply

## 2020-05-27 NOTE — Telephone Encounter (Signed)
Check labs in 3 months

## 2020-05-28 NOTE — Progress Notes (Signed)
Chief Complaint:   OBESITY Anna Walker is here to discuss her progress with her obesity treatment plan along with follow-up of her obesity related diagnoses. Anna Walker is on the Category 3 Plan and states she is following her eating plan approximately 30% of the time. Anna Walker states she is doing yard work.  Today's visit was #: 62 Starting weight: 277 lbs Starting date: 05/01/2019 Today's weight: 268 lbs Today's date: 05/27/2020 Total lbs lost to date: 9 Total lbs lost since last in-office visit: 1  Interim History: Due to fatigue, fast food is often utilized. Pt will not pack snacks when running errands or driving carpool. She takes care of her grandchildren and provides transportation for them for school and other activities.  Subjective:   1. Vitamin D deficiency Anna Walker's Vitamin D level was 33.4 on 04/20/2020, which is below goal of 50.Marland Kitchen She is currently taking prescription vitamin D 50,000 IU each week. She denies nausea, vomiting or muscle weakness.  2. Pre-diabetes 04/20/2020 BG WNL at 95. Elevated A1c 5.9 with elevated insulin level 16.3.  Lab Results  Component Value Date   HGBA1C 5.9 (H) 04/20/2020   Lab Results  Component Value Date   INSULIN 16.3 04/20/2020   INSULIN 19.6 12/24/2019   INSULIN 23.3 09/17/2019   INSULIN 22.3 05/01/2019    Assessment/Plan:   1. Vitamin D deficiency Low Vitamin D level contributes to fatigue and are associated with obesity, breast, and colon cancer. She agrees to continue to take prescription Vitamin D @50 ,000 IU every week and will follow-up for routine testing of Vitamin D, at least 2-3 times per year to avoid over-replacement.  2. Pre-diabetes Anna Walker will continue to work on weight loss, exercise, and decreasing simple carbohydrates to help decrease the risk of diabetes. Check labs at the end of June 2022.  3. Class 3 severe obesity with serious comorbidity and body mass index (BMI) of 40.0 to 44.9 in adult, unspecified  obesity type Anna Walker)  Anna Walker is currently in the action stage of change. As such, her goal is to continue with weight loss efforts. She has agreed to keeping a food journal and adhering to recommended goals of 1400-1500 calories and 100 g protein.   Meal plan/prep Pack high protein snacks when running errands. Handouts: Eating Out Guide and Recipe Guide  Exercise goals: As is  Behavioral modification strategies: increasing lean protein intake, decreasing simple carbohydrates, no skipping meals, meal planning and cooking strategies, better snacking choices, planning for success and keeping a strict food journal.  Anna Walker has agreed to follow-up with our clinic in 3 weeks. She was informed of the importance of frequent follow-up visits to maximize her success with intensive lifestyle modifications for her multiple health conditions.   Objective:   Blood pressure 119/75, pulse 67, temperature 97.8 F (36.6 C), height 5\' 6"  (1.676 m), weight 268 lb (121.6 kg), SpO2 97 %. Body mass index is 43.26 kg/m.  General: Cooperative, alert, well developed, in no acute distress. HEENT: Conjunctivae and lids unremarkable. Cardiovascular: Regular rhythm.  Lungs: Normal work of breathing. Neurologic: No focal deficits.   Lab Results  Component Value Date   CREATININE 0.82 04/20/2020   BUN 15 04/20/2020   NA 147 (H) 04/20/2020   K 4.2 04/20/2020   CL 109 (H) 04/20/2020   CO2 23 04/20/2020   Lab Results  Component Value Date   ALT 22 04/20/2020   AST 14 04/20/2020   ALKPHOS 83 04/20/2020   BILITOT 0.4 04/20/2020   Lab  Results  Component Value Date   HGBA1C 5.9 (H) 04/20/2020   HGBA1C 5.5 12/24/2019   HGBA1C 5.8 (H) 09/17/2019   HGBA1C 6.0 (H) 08/14/2018   HGBA1C 5.9 (H) 02/09/2015   Lab Results  Component Value Date   INSULIN 16.3 04/20/2020   INSULIN 19.6 12/24/2019   INSULIN 23.3 09/17/2019   INSULIN 22.3 05/01/2019   Lab Results  Component Value Date   TSH 1.507  02/09/2015   Lab Results  Component Value Date   CHOL 166 04/20/2020   HDL 57 04/20/2020   LDLCALC 88 04/20/2020   TRIG 117 04/20/2020   CHOLHDL 2.9 04/20/2020   Lab Results  Component Value Date   WBC 6.2 04/18/2020   HGB 12.0 04/18/2020   HCT 36.8 04/18/2020   MCV 85.6 04/18/2020   PLT 235 04/18/2020   No results found for: IRON, TIBC, FERRITIN  Obesity Behavioral Intervention:   Approximately 15 minutes were spent on the discussion below.  ASK: We discussed the diagnosis of obesity with Benjamine Mola today and Shalin agreed to give [place] permission to discuss obesity behavioral modification therapy today.  ASSESS: Tarra has the diagnosis of obesity and her BMI today is 43.4. Malea is in the action stage of change.   ADVISE: Shantese was educated on the multiple health risks of obesity as well as the benefit of weight loss to improve her health. She was advised of the need for long term treatment and the importance of lifestyle modifications to improve her current health and to decrease her risk of future health problems.  AGREE: Multiple dietary modification options and treatment options were discussed and Chattie agreed to follow the recommendations documented in the above note.  ARRANGE: Essica was educated on the importance of frequent visits to treat obesity as outlined per CMS and USPSTF guidelines and agreed to schedule her next follow up appointment today.  Attestation Statements:   Reviewed by clinician on day of visit: allergies, medications, problem list, medical history, surgical history, family history, social history, and previous encounter notes.  Coral Ceo, CMA, am acting as transcriptionist for Mina Marble, NP.  I have reviewed the above documentation for accuracy and completeness, and I agree with the above. -  Thelda Gagan d. Analaura Messler, NP-C

## 2020-06-02 ENCOUNTER — Other Ambulatory Visit: Payer: Self-pay | Admitting: Neurology

## 2020-06-11 ENCOUNTER — Ambulatory Visit (INDEPENDENT_AMBULATORY_CARE_PROVIDER_SITE_OTHER): Payer: HMO | Admitting: Bariatrics

## 2020-06-11 ENCOUNTER — Encounter (INDEPENDENT_AMBULATORY_CARE_PROVIDER_SITE_OTHER): Payer: Self-pay | Admitting: Bariatrics

## 2020-06-11 ENCOUNTER — Other Ambulatory Visit: Payer: Self-pay

## 2020-06-11 VITALS — BP 118/71 | HR 71 | Temp 98.4°F | Ht 66.0 in | Wt 266.0 lb

## 2020-06-11 DIAGNOSIS — R7303 Prediabetes: Secondary | ICD-10-CM

## 2020-06-11 DIAGNOSIS — Z6841 Body Mass Index (BMI) 40.0 and over, adult: Secondary | ICD-10-CM | POA: Diagnosis not present

## 2020-06-11 DIAGNOSIS — E559 Vitamin D deficiency, unspecified: Secondary | ICD-10-CM

## 2020-06-11 MED ORDER — VITAMIN D (ERGOCALCIFEROL) 1.25 MG (50000 UNIT) PO CAPS: 1.0000 | ORAL_CAPSULE | ORAL | 0 refills | Status: DC

## 2020-06-15 ENCOUNTER — Other Ambulatory Visit: Payer: Self-pay

## 2020-06-15 ENCOUNTER — Other Ambulatory Visit: Payer: Self-pay | Admitting: Neurology

## 2020-06-15 ENCOUNTER — Ambulatory Visit (INDEPENDENT_AMBULATORY_CARE_PROVIDER_SITE_OTHER): Payer: HMO | Admitting: Podiatry

## 2020-06-15 ENCOUNTER — Ambulatory Visit (INDEPENDENT_AMBULATORY_CARE_PROVIDER_SITE_OTHER): Payer: HMO

## 2020-06-15 ENCOUNTER — Ambulatory Visit: Payer: HMO

## 2020-06-15 DIAGNOSIS — M778 Other enthesopathies, not elsewhere classified: Secondary | ICD-10-CM

## 2020-06-15 DIAGNOSIS — B070 Plantar wart: Secondary | ICD-10-CM

## 2020-06-15 MED ORDER — MELOXICAM 15 MG PO TABS: 15.0000 mg | ORAL_TABLET | Freq: Every day | ORAL | 1 refills | Status: DC

## 2020-06-15 MED ORDER — BETAMETHASONE SOD PHOS & ACET 6 (3-3) MG/ML IJ SUSP
3.0000 mg | Freq: Once | INTRAMUSCULAR | Status: AC
Start: 2020-06-15 — End: 2020-06-15
  Administered 2020-06-15: 3 mg via INTRA_ARTICULAR

## 2020-06-15 NOTE — Progress Notes (Signed)
Subjective: 65 y.o. female presenting today as a new patient for evaluation of 2 separate complaints.  First the patient states that she possibly stepped on a splinter to the left forefoot.  Is been painful and sore for a few months now.  She has tried to pick at it and did get out herself without improvement.  She presents for further treatment evaluation  Patient also states that for several months now she has had right foot pain.  She denies a history of injury.  She states that it is on top of her foot.  She does apply the OTC topical Aspercreme with minimal improvement.    Past Medical History:  Diagnosis Date  . ADD (attention deficit disorder)   . Anxiety   . Biliary colic   . Bladder leak   . Diabetes mellitus without complication (HCC)    type 2 , diet controlled   . Diverticular disease   . DVT (deep venous thrombosis) (HCC)    left leg , resolved after treatment of blood thinners   . Edema of both lower extremities   . GERD (gastroesophageal reflux disease)    causes coughing   . Glaucoma   . High cholesterol   . Hypertension   . Hypothyroid   . Leg cramps    "in my thighs"   . Leg cramps   . Migraines   . Multiple sclerosis ([place])    per patient " that ha sbeen ruled out , they though it was that initially but it did not preogress like it"  . Optic neuritis    lesion on right optic nerve; right eye only , reduced vision and blurriness , loss of depth perception, glasses do not help when it flares    . OSA (obstructive sleep apnea)    nightly cpap   . Paresthesias/numbness    fingers and toes   . Pneumonia 2017  . Restless leg syndrome   . Sleep apnea   . SOB (shortness of breath)   . Thyroid disease    Hypothyroidism    Objective: Physical Exam General: The patient is alert and oriented x3 in no acute distress.   Dermatology: Hyperkeratotic skin lesion(s) noted to the plantar aspect of the left foot approximately 0.3cm in diameter. Pinpoint bleeding  noted upon debridement. Skin is warm, dry and supple bilateral lower extremities. Negative for open lesions or macerations.   Vascular: Palpable pedal pulses bilaterally. No edema or erythema noted. Capillary refill within normal limits.   Neurological: Epicritic and protective threshold grossly intact bilaterally.    Musculoskeletal Exam: Pain on palpation to the noted skin lesion(s).    There is also pain on palpation overlying the fourth metatarsal right foot.  Range of motion within normal limits to all pedal and ankle joints bilateral. Muscle strength 5/5 in all groups bilateral.   Radiographic exam: Normal osseous mineralization.  Joint spaces preserved.  There is some cortical irregularity noted to the lateral aspect of the fourth metatarsal along the diaphysis.  No identifiable stress fracture noted.   Assessment: #1 plantar wart left foot #2 capsulitis/possible stress fracture right fourth metatarsal   Plan of Care:  #1 Patient was evaluated. #2 Excisional debridement of the plantar wart lesion(s) was performed using a chisel blade.  Salicylic acid was applied and the lesion(s) was dressed with a dry sterile dressing. #3  Injection of 0.5 cc Celestone Soluspan injected along fourth metatarsal of the right foot  #4 patient is to return to  clinic in 4 weeks   Edrick Kins, DPM [facility]  Dr. Edrick Kins, DPM    [address], [place] [address]                Office [phone]  Fax [phone]

## 2020-06-15 NOTE — Progress Notes (Signed)
Chief Complaint:   OBESITY Anna Walker is here to discuss her progress with her obesity treatment plan along with follow-up of her obesity related diagnoses. Navjot is on the Category 3 Plan and states she is following her eating plan approximately 50% of the time. Catelin states she is doing yard work 8 hours 1 times per week.  Today's visit was #: 20 Starting weight: 277 lbs Starting date: 05/01/2019 Today's weight: 266 lbs Today's date: 06/11/2020 Total lbs lost to date: 11 Total lbs lost since last in-office visit: 2  Interim History: Zeah is down an additional 2 lbs. She is using the MyFitnessPal app.  Subjective:   1. Pre-diabetes Dyllan is not on medication. She reports no episodes of low blood sugars.  Lab Results  Component Value Date   HGBA1C 5.9 (H) 04/20/2020   Lab Results  Component Value Date   INSULIN 16.3 04/20/2020   INSULIN 19.6 12/24/2019   INSULIN 23.3 09/17/2019   INSULIN 22.3 05/01/2019    2. Vitamin D deficiency Serai is taking prescription Vit D as directed.  Assessment/Plan:   1. Pre-diabetes Amelda will continue to work on weight loss, exercise, and decreasing simple carbohydrates to help decrease the risk of diabetes.  1. Increase healthy fats and protein. 2. Eat small frequent meals. 3. Will keep glucose tablets on her person in case she becomes hypoglycemic.   2. Vitamin D deficiency Low Vitamin D level contributes to fatigue and are associated with obesity, breast, and colon cancer. She agrees to continue to take prescription Vitamin D @50 ,000 IU every week and will follow-up for routine testing of Vitamin D, at least 2-3 times per year to avoid over-replacement. - Vitamin D, Ergocalciferol, (DRISDOL) 1.25 MG (50000 UNIT) CAPS capsule; Take 1 capsule (50,000 Units total) by mouth every 7 (seven) days.  Dispense: 12 capsule; Refill: 0  3. Obesity, current BMI 13 Sidnee is currently in the action stage of change. As  such, her goal is to continue with weight loss efforts. She has agreed to the Category 3 Plan.   Meal plan Intentional eating Eat small frequent meals  Exercise goals: As is  Behavioral modification strategies: increasing lean protein intake, decreasing simple carbohydrates, increasing vegetables, increasing water intake, decreasing eating out, no skipping meals, meal planning and cooking strategies, keeping healthy foods in the home and planning for success.  Antanasia has agreed to follow-up with our clinic in 2 weeks. She was informed of the importance of frequent follow-up visits to maximize her success with intensive lifestyle modifications for her multiple health conditions.   Objective:   Blood pressure 118/71, pulse 71, temperature 98.4 F (36.9 C), height 5\' 6"  (1.676 m), weight 266 lb (120.7 kg), SpO2 97 %. Body mass index is 42.93 kg/m.  General: Cooperative, alert, well developed, in no acute distress. HEENT: Conjunctivae and lids unremarkable. Cardiovascular: Regular rhythm.  Lungs: Normal work of breathing. Neurologic: No focal deficits.   Lab Results  Component Value Date   CREATININE 0.82 04/20/2020   BUN 15 04/20/2020   NA 147 (H) 04/20/2020   K 4.2 04/20/2020   CL 109 (H) 04/20/2020   CO2 23 04/20/2020   Lab Results  Component Value Date   ALT 22 04/20/2020   AST 14 04/20/2020   ALKPHOS 83 04/20/2020   BILITOT 0.4 04/20/2020   Lab Results  Component Value Date   HGBA1C 5.9 (H) 04/20/2020   HGBA1C 5.5 12/24/2019   HGBA1C 5.8 (H) 09/17/2019   HGBA1C 6.0 (  H) 08/14/2018   HGBA1C 5.9 (H) 02/09/2015   Lab Results  Component Value Date   INSULIN 16.3 04/20/2020   INSULIN 19.6 12/24/2019   INSULIN 23.3 09/17/2019   INSULIN 22.3 05/01/2019   Lab Results  Component Value Date   TSH 1.507 02/09/2015   Lab Results  Component Value Date   CHOL 166 04/20/2020   HDL 57 04/20/2020   LDLCALC 88 04/20/2020   TRIG 117 04/20/2020   CHOLHDL 2.9  04/20/2020   Lab Results  Component Value Date   WBC 6.2 04/18/2020   HGB 12.0 04/18/2020   HCT 36.8 04/18/2020   MCV 85.6 04/18/2020   PLT 235 04/18/2020   No results found for: IRON, TIBC, FERRITIN  Obesity Behavioral Intervention:   Approximately 15 minutes were spent on the discussion below.  ASK: We discussed the diagnosis of obesity with Benjamine Mola today and Yamira agreed to give [place] permission to discuss obesity behavioral modification therapy today.  ASSESS: Jasreet has the diagnosis of obesity and her BMI today is 55. Jadzia is in the action stage of change.   ADVISE: Ryli was educated on the multiple health risks of obesity as well as the benefit of weight loss to improve her health. She was advised of the need for long term treatment and the importance of lifestyle modifications to improve her current health and to decrease her risk of future health problems.  AGREE: Multiple dietary modification options and treatment options were discussed and Abi agreed to follow the recommendations documented in the above note.  ARRANGE: Aizlynn was educated on the importance of frequent visits to treat obesity as outlined per CMS and USPSTF guidelines and agreed to schedule her next follow up appointment today.  Attestation Statements:   Reviewed by clinician on day of visit: allergies, medications, problem list, medical history, surgical history, family history, social history, and previous encounter notes.  Coral Ceo, CMA, am acting as Location manager for [employer], DO.  I have reviewed the above documentation for accuracy and completeness, and I agree with the above. Jearld Lesch, DO

## 2020-06-16 ENCOUNTER — Encounter (INDEPENDENT_AMBULATORY_CARE_PROVIDER_SITE_OTHER): Payer: Self-pay | Admitting: Bariatrics

## 2020-06-23 ENCOUNTER — Emergency Department (HOSPITAL_COMMUNITY)
Admission: EM | Admit: 2020-06-23 | Discharge: 2020-06-24 | Disposition: A | Discharge status: Home / Self Care | Payer: HMO | Attending: Emergency Medicine | Admitting: Emergency Medicine

## 2020-06-23 ENCOUNTER — Encounter (HOSPITAL_COMMUNITY): Payer: Self-pay

## 2020-06-23 ENCOUNTER — Other Ambulatory Visit: Payer: Self-pay

## 2020-06-23 ENCOUNTER — Emergency Department (HOSPITAL_COMMUNITY): Payer: HMO

## 2020-06-23 DIAGNOSIS — R200 Anesthesia of skin: Secondary | ICD-10-CM | POA: Diagnosis not present

## 2020-06-23 DIAGNOSIS — I10 Essential (primary) hypertension: Secondary | ICD-10-CM | POA: Diagnosis not present

## 2020-06-23 DIAGNOSIS — H53149 Visual discomfort, unspecified: Secondary | ICD-10-CM | POA: Insufficient documentation

## 2020-06-23 DIAGNOSIS — R202 Paresthesia of skin: Secondary | ICD-10-CM | POA: Diagnosis not present

## 2020-06-23 DIAGNOSIS — H9201 Otalgia, right ear: Secondary | ICD-10-CM | POA: Diagnosis not present

## 2020-06-23 DIAGNOSIS — Z7982 Long term (current) use of aspirin: Secondary | ICD-10-CM | POA: Insufficient documentation

## 2020-06-23 DIAGNOSIS — E039 Hypothyroidism, unspecified: Secondary | ICD-10-CM | POA: Insufficient documentation

## 2020-06-23 DIAGNOSIS — Z87891 Personal history of nicotine dependence: Secondary | ICD-10-CM | POA: Diagnosis not present

## 2020-06-23 DIAGNOSIS — Z79899 Other long term (current) drug therapy: Secondary | ICD-10-CM | POA: Insufficient documentation

## 2020-06-23 DIAGNOSIS — R519 Headache, unspecified: Secondary | ICD-10-CM | POA: Insufficient documentation

## 2020-06-23 DIAGNOSIS — E119 Type 2 diabetes mellitus without complications: Secondary | ICD-10-CM | POA: Diagnosis not present

## 2020-06-23 DIAGNOSIS — R531 Weakness: Secondary | ICD-10-CM | POA: Diagnosis not present

## 2020-06-23 DIAGNOSIS — M6281 Muscle weakness (generalized): Secondary | ICD-10-CM | POA: Diagnosis not present

## 2020-06-23 LAB — CBC WITH DIFFERENTIAL/PLATELET
Abs Immature Granulocytes: 0.04 10*3/uL (ref 0.00–0.07)
Basophils Absolute: 0 10*3/uL (ref 0.0–0.1)
Basophils Relative: 0 %
Eosinophils Absolute: 0.2 10*3/uL (ref 0.0–0.5)
Eosinophils Relative: 3 %
HCT: 38.3 % (ref 36.0–46.0)
Hemoglobin: 12.2 g/dL (ref 12.0–15.0)
Immature Granulocytes: 1 %
Lymphocytes Relative: 33 %
Lymphs Abs: 2.5 10*3/uL (ref 0.7–4.0)
MCH: 27.4 pg (ref 26.0–34.0)
MCHC: 31.9 g/dL (ref 30.0–36.0)
MCV: 86.1 fL (ref 80.0–100.0)
Monocytes Absolute: 0.7 10*3/uL (ref 0.1–1.0)
Monocytes Relative: 10 %
Neutro Abs: 4 10*3/uL (ref 1.7–7.7)
Neutrophils Relative %: 53 %
Platelets: 260 10*3/uL (ref 150–400)
RBC: 4.45 MIL/uL (ref 3.87–5.11)
RDW: 13.7 % (ref 11.5–15.5)
WBC: 7.5 10*3/uL (ref 4.0–10.5)
nRBC: 0 % (ref 0.0–0.2)

## 2020-06-23 LAB — BASIC METABOLIC PANEL
Anion gap: 9 (ref 5–15)
BUN: 18 mg/dL (ref 8–23)
CO2: 26 mmol/L (ref 22–32)
Calcium: 8.5 mg/dL — ABNORMAL LOW (ref 8.9–10.3)
Chloride: 103 mmol/L (ref 98–111)
Creatinine, Ser: 0.87 mg/dL (ref 0.44–1.00)
GFR, Estimated: >60 mL/min (ref >60)
Glucose, Bld: 114 mg/dL — ABNORMAL HIGH (ref 70–99)
Potassium: 3.4 mmol/L — ABNORMAL LOW (ref 3.5–5.1)
Sodium: 138 mmol/L (ref 135–145)

## 2020-06-23 NOTE — ED Triage Notes (Signed)
Patient reports hx of migraines, reports headache, eye pain and R arm tingling starting tonight, states she does not have her usual medication and has only taken Tylenol

## 2020-06-23 NOTE — ED Provider Notes (Signed)
Emergency Medicine Provider Triage Evaluation Note  Anna Walker , a 65 y.o. female  was evaluated in triage.  Pt complains of throbbing right sided headache behind right eye that began around 2:30 PM today. She reports hx of migraine headaches however has not had one in awhile and has been out of medications; she normally take sumatriptan. Pt does mention she woke up this AM feeling like her right hand was tingling which is atypical. Last known normal around 11 PM last night. It has not gone away throughout the day. No vomiting. No chest pain or SOB.   Review of Systems  Positive: + HA, nausea, tingling in right hand Negative: - chest pain  Physical Exam  BP 125/67 (BP Location: Left Arm)   Pulse 75   Temp 98.5 F (36.9 C) (Oral)   Resp 16   Ht 5\' 6"  (1.676 m)   Wt 120.7 kg   SpO2 96%   BMI 42.93 kg/m  Gen:   Awake, no distress   Resp:  Normal effort  MSK:   Moves extremities without difficulty  Other:  Neuro intact. Decreased sensation to right hand with mildly weaker grip strength.   Medical Decision Making  Medically screening exam initiated at 8:14 PM.  Appropriate orders placed.  SIMONA ROCQUE was informed that the remainder of the evaluation will be completed by another provider, this initial triage assessment does not replace that evaluation, and the importance of remaining in the ED until their evaluation is complete.     Eustaquio Maize, PA-C 06/23/20 2015    Daleen Bo, MD 06/23/20 [phone]

## 2020-06-24 MED ORDER — DEXAMETHASONE 4 MG PO TABS
10.0000 mg | ORAL_TABLET | Freq: Once | ORAL | Status: AC
  Administered 2020-06-24: 10 mg via ORAL
  Filled 2020-06-24: qty 3

## 2020-06-24 MED ORDER — PROCHLORPERAZINE EDISYLATE 10 MG/2ML IJ SOLN
10.0000 mg | Freq: Once | INTRAMUSCULAR | Status: AC
  Administered 2020-06-24: 10 mg via INTRAMUSCULAR
  Filled 2020-06-24: qty 2

## 2020-06-24 MED ORDER — DIPHENHYDRAMINE HCL 50 MG/ML IJ SOLN
25.0000 mg | Freq: Once | INTRAMUSCULAR | Status: AC
  Administered 2020-06-24: 25 mg via INTRAMUSCULAR
  Filled 2020-06-24: qty 1

## 2020-06-24 NOTE — Discharge Instructions (Signed)
Please return for worsening headache if you develop a fever or worsening numbness or weakness.

## 2020-06-24 NOTE — ED Provider Notes (Signed)
[facility] Provider Note   CSN: [id number] Arrival date & time: 06/23/20  1921     History Chief Complaint  Patient presents with  . Headache  . Eye Pain  . Extremity Weakness    Anna Walker is a 65 y.o. female.  65 yo F with a chief complaint of a headache.  Woke up with it this morning.  Feels like her prior headaches pain around the right side of the eye and extends to the left side of her head.  Photophobia and phonophobia.  She is having some tingling to her right hand which is atypical.  She also feels like she has some pain up her right arm and into the shoulder.  Denies trauma.  Denies fevers.  Denies nausea or vomiting.  Typically takes a triptan for this but had run out.  The history is provided by the patient.  Headache Pain location:  Frontal (R eye extends to L frontal) Quality:  Dull Severity currently:  9/10 Severity at highest:  9/10 Onset quality:  Gradual Duration:  1 day Timing:  Constant Progression:  Worsening Chronicity:  Recurrent Context: bright light and loud noise   Relieved by:  Resting in a darkened room Worsened by:  Nothing Ineffective treatments:  None tried Associated symptoms: eye pain and numbness (tingling to R hand)   Associated symptoms: no congestion, no dizziness, no fever, no myalgias, no nausea and no vomiting   Eye Pain Associated symptoms include headaches. Pertinent negatives include no chest pain and no shortness of breath.  Extremity Weakness Associated symptoms include headaches. Pertinent negatives include no chest pain and no shortness of breath.       Past Medical History:  Diagnosis Date  . ADD (attention deficit disorder)   . Anxiety   . Biliary colic   . Bladder leak   . Diabetes mellitus without complication (HCC)    type 2 , diet controlled   . Diverticular disease   . DVT (deep venous thrombosis) (HCC)    left leg , resolved after treatment of blood thinners    . Edema of both lower extremities   . GERD (gastroesophageal reflux disease)    causes coughing   . Glaucoma   . High cholesterol   . Hypertension   . Hypothyroid   . Leg cramps    "in my thighs"   . Leg cramps   . Migraines   . Multiple sclerosis ([place])    per patient " that ha sbeen ruled out , they though it was that initially but it did not preogress like it"  . Optic neuritis    lesion on right optic nerve; right eye only , reduced vision and blurriness , loss of depth perception, glasses do not help when it flares    . OSA (obstructive sleep apnea)    nightly cpap   . Paresthesias/numbness    fingers and toes   . Pneumonia 2017  . Restless leg syndrome   . Sleep apnea   . SOB (shortness of breath)   . Thyroid disease    Hypothyroidism    Patient Active Problem List   Diagnosis Date Noted  . Other hyperlipidemia 04/21/2020  . Prediabetes 03/31/2020  . Essential hypertension 03/31/2020  . Vitamin D deficiency 05/02/2019  . Class 3 severe obesity due to excess calories with serious comorbidity and body mass index (BMI) of 40.0 to 44.9 in adult ([place]) 05/01/2019  . Chronic pain of both knees  04/25/2017  . DOE (dyspnea on exertion)   . Substernal chest pain 02/08/2015  . Attention deficit disorder 12/22/2014  . History of optic neuritis 06/20/2014  . OSA on CPAP 06/20/2014  . Restless leg syndrome 06/20/2014  . Other fatigue 06/20/2014  . Migraine without aura and without status migrainosus, not intractable 06/20/2014  . possible MS 02/24/2014  . Hypothyroidism 02/24/2014  . GERD (gastroesophageal reflux disease) 02/24/2014    Past Surgical History:  Procedure Laterality Date  . ABDOMINAL HYSTERECTOMY    . BREAST LUMPECTOMY Left   . CESAREAN SECTION     x2   . CHOLECYSTECTOMY N/A 08/16/2018   Procedure: LAPAROSCOPIC CHOLECYSTECTOMY;  Surgeon: Greer Pickerel, MD;  Location: [facility];  Service: General;  Laterality: N/A;  . COLONOSCOPY WITH PROPOFOL N/A 01/03/2019    Procedure: COLONOSCOPY WITH PROPOFOL;  Surgeon: Wonda Horner, MD;  Location: [facility] ENDOSCOPY;  Service: Endoscopy;  Laterality: N/A;  . MENISCUS REPAIR Left left  . MENISCUS REPAIR Right    knee   . TUBAL LIGATION     during 2nd c section      OB History    Gravida  2   Para  2   Term      Preterm      AB      Living        SAB      IAB      Ectopic      Multiple      Live Births              Family History  Problem Relation Age of Onset  . Barrett's esophagus Mother   . Hypertension Mother   . Obesity Mother   . Graves' disease Sister   . Rheum arthritis Sister   . Heart attack Father 14  . Sudden death Father   . Coronary artery disease Neg Hx     Social History   Tobacco Use  . Smoking status: Former Smoker    Quit date: 01/25/1984    Years since quitting: 36.4  . Smokeless tobacco: Never Used  Vaping Use  . Vaping Use: Never used  Substance Use Topics  . Alcohol use: Not Currently  . Drug use: No    Home Medications Prior to Admission medications   Medication Sig Start Date End Date Taking? Authorizing Provider  acetaminophen (TYLENOL) 500 MG tablet Take 500 mg by mouth every 6 (six) hours as needed.    [provider]  amantadine (SYMMETREL) 100 MG capsule TAKE 1 CAPSULE(100 MG) BY MOUTH THREE TIMES DAILY 11/25/19   Tomi Likens, Adam R, DO  ASPERCREME LIDOCAINE EX Apply topically.    [provider]  aspirin 81 MG EC tablet Take 81 mg by mouth daily. Swallow whole.    [provider]  FLUoxetine (PROZAC) 10 MG capsule Take 10 mg by mouth at bedtime. Take with 20 mg dose 03/19/17   [provider]  FLUoxetine (PROZAC) 20 MG capsule Take 20 mg by mouth at bedtime. Take with 10 mg dose 06/21/18   [provider]  gabapentin (NEURONTIN) 300 MG capsule TAKE 1 CAPSULE BY MOUTH AT [facility] AND 2 CAPSULES AT BEDTIME 06/15/20   Tomi Likens, Adam R, DO  hydrochlorothiazide (HYDRODIURIL) 12.5 MG tablet Take 12.5 mg by mouth  daily.  02/06/17   [provider]  levothyroxine (SYNTHROID, LEVOTHROID) 150 MCG tablet Take 150 mcg by mouth daily before breakfast.    [provider]  meloxicam (MOBIC)  15 MG tablet Take 1 tablet (15 mg total) by mouth daily. 06/15/20   Edrick Kins, DPM  omeprazole (PRILOSEC) 40 MG capsule Take 1 capsule (40 mg total) by mouth 2 (two) times daily before a meal. 02/10/15   Barton Dubois, MD  oxybutynin (DITROPAN) 5 MG tablet Take 5 mg by mouth at bedtime.     [provider]  rOPINIRole (REQUIP) 3 MG tablet Take 3mg  at dinner if needed and 3mg  at bedtime 06/03/20   Pieter Partridge, DO  simvastatin (ZOCOR) 10 MG tablet Take 10 mg by mouth every evening.     [provider]  SUMAtriptan (IMITREX) 100 MG tablet Take 1 tablet (100 mg total) by mouth once as needed for migraine. May repeat in 2 hours if headache persists or recurs. 09/21/18   Pieter Partridge, DO  Vitamin D, Ergocalciferol, (DRISDOL) 1.25 MG (50000 UNIT) CAPS capsule Take 1 capsule (50,000 Units total) by mouth every 7 (seven) days. 06/11/20   Jearld Lesch A, DO    Allergies    Adderall [amphetamine-dextroamphetamine] and Topamax [topiramate]  Review of Systems   Review of Systems  Constitutional: Negative for chills and fever.  HENT: Negative for congestion and rhinorrhea.   Eyes: Positive for pain. Negative for redness and visual disturbance.  Respiratory: Negative for shortness of breath and wheezing.   Cardiovascular: Negative for chest pain and palpitations.  Gastrointestinal: Negative for nausea and vomiting.  Genitourinary: Negative for dysuria and urgency.  Musculoskeletal: Positive for extremity weakness. Negative for arthralgias and myalgias.  Skin: Negative for pallor and wound.  Neurological: Positive for numbness (tingling to R hand) and headaches. Negative for dizziness.    Physical Exam Updated Vital Signs BP 135/70   Pulse 65   Temp 98.5 F (36.9 C) (Oral)   Resp 16   Ht  5\' 6"  (1.676 m)   Wt 120.7 kg   SpO2 99%   BMI 42.93 kg/m   Physical Exam Vitals and nursing note reviewed.  Constitutional:      General: She is not in acute distress.    Appearance: She is well-developed. She is not diaphoretic.  HENT:     Head: Normocephalic and atraumatic.  Eyes:     Pupils: Pupils are equal, round, and reactive to light.  Cardiovascular:     Rate and Rhythm: Normal rate and regular rhythm.     Heart sounds: No murmur heard. No friction rub. No gallop.   Pulmonary:     Effort: Pulmonary effort is normal.     Breath sounds: No wheezing or rales.  Abdominal:     General: There is no distension.     Palpations: Abdomen is soft.     Tenderness: There is no abdominal tenderness.  Musculoskeletal:        General: No tenderness.     Cervical back: Normal range of motion and neck supple.     Comments: Pain with palpation of the right trapezius.  Full range of motion of the shoulder.  Full range of motion of the elbow and the wrist.  Skin:    General: Skin is warm and dry.  Neurological:     Mental Status: She is alert and oriented to person, place, and time.     Comments: Benign neurologic exam.  Pulse motor and sensation intact to the right upper extremity.  Negative Tinel's.  Ambulates with a cane and as per her baseline.  Psychiatric:        Behavior: Behavior  normal.     ED Results / Procedures / Treatments   Labs (all labs ordered are listed, but only abnormal results are displayed) Labs Reviewed  BASIC METABOLIC PANEL - Abnormal; Notable for the following components:      Result Value   Potassium 3.4 (*)    Glucose, Bld 114 (*)    Calcium 8.5 (*)    All other components within normal limits  CBC WITH DIFFERENTIAL/PLATELET    EKG EKG Interpretation  Date/Time:  Tuesday Jun 23 2020 20:15:56 EDT Ventricular Rate:  70 PR Interval:  130 QRS Duration: 86 QT Interval:  422 QTC Calculation: 455 R Axis:   61 Text Interpretation: Normal sinus  rhythm Low voltage QRS Nonspecific T wave abnormality Abnormal ECG since last tracing no significant change Confirmed by Daleen Bo [phone]) on 06/23/2020 11:02:56 PM   Radiology CT Head Wo Contrast  Result Date: 06/23/2020 CLINICAL DATA:  Headache EXAM: CT HEAD WITHOUT CONTRAST TECHNIQUE: Contiguous axial images were obtained from the base of the skull through the vertex without intravenous contrast. COMPARISON:  07/25/2009 FINDINGS: Brain: There is no mass, hemorrhage or extra-axial collection. The size and configuration of the ventricles and extra-axial CSF spaces are normal. The brain parenchyma is normal, without acute or chronic infarction. Vascular: No abnormal hyperdensity of the major intracranial arteries or dural venous sinuses. No intracranial atherosclerosis. Skull: The visualized skull base, calvarium and extracranial soft tissues are normal. Sinuses/Orbits: No fluid levels or advanced mucosal thickening of the visualized paranasal sinuses. No mastoid or middle ear effusion. The orbits are normal. IMPRESSION: Normal head CT. Electronically Signed   By: Ulyses Jarred M.D.   On: 06/23/2020 21:05    Procedures Procedures   Medications Ordered in ED Medications  dexamethasone (DECADRON) tablet 10 mg (has no administration in time range)  prochlorperazine (COMPAZINE) injection 10 mg (10 mg Intramuscular Given 06/24/20 0301)  diphenhydrAMINE (BENADRYL) injection 25 mg (25 mg Intramuscular Given 06/24/20 0301)    ED Course  I have reviewed the triage vital signs and the nursing notes.  Pertinent labs & imaging results that were available during my care of the patient were reviewed by me and considered in my medical decision making (see chart for details).    MDM Rules/Calculators/A&P                          65 yo F with a chief complaint of a headache going on since this morning.  Has a history of headaches and this feels similar.  Only a typical symptom is that she feels like her  right hand is tingling.  She does have some pain to the right trapezius which could be the etiology for that symptom.  We will give a headache cocktail.  Reassess.  Patient was seen in the quick look process and had blood work that is unremarkable, had a CT scan of the head that was negative.  Patient is feeling better on reassessment.  Has persistent right upper extremity tingling.  I did offer her MRI which she is declining.  Neurology follow-up.  4:01 AM:  I have discussed the diagnosis/risks/treatment options with the patient and believe the pt to be eligible for discharge home to follow-up with Neuro. We also discussed returning to the ED immediately if new or worsening sx occur. We discussed the sx which are most concerning (e.g., sudden worsening pain, fever, inability to tolerate by mouth, stroke, s/sx) that necessitate immediate return. Medications administered  to the patient during their visit and any new prescriptions provided to the patient are listed below.  Medications given during this visit Medications  dexamethasone (DECADRON) tablet 10 mg (has no administration in time range)  prochlorperazine (COMPAZINE) injection 10 mg (10 mg Intramuscular Given 06/24/20 0301)  diphenhydrAMINE (BENADRYL) injection 25 mg (25 mg Intramuscular Given 06/24/20 0301)     The patient appears reasonably screen and/or stabilized for discharge and I doubt any other medical condition or other [facility] requiring further screening, evaluation, or treatment in the ED at this time prior to discharge.    Final Clinical Impression(s) / ED Diagnoses Final diagnoses:  Right-sided headache    Rx / DC Orders ED Discharge Orders    None       Deno Etienne, DO 06/24/20 0401

## 2020-06-26 ENCOUNTER — Encounter (INDEPENDENT_AMBULATORY_CARE_PROVIDER_SITE_OTHER): Payer: Self-pay | Admitting: Adult Health

## 2020-06-26 DIAGNOSIS — M1711 Unilateral primary osteoarthritis, right knee: Secondary | ICD-10-CM | POA: Diagnosis not present

## 2020-06-29 ENCOUNTER — Other Ambulatory Visit: Payer: Self-pay | Admitting: Neurology

## 2020-06-29 MED ORDER — UBRELVY 100 MG PO TABS: ORAL_TABLET | ORAL | 5 refills | Status: DC

## 2020-07-01 ENCOUNTER — Other Ambulatory Visit: Payer: Self-pay

## 2020-07-01 ENCOUNTER — Telehealth: Payer: Self-pay | Admitting: Neurology

## 2020-07-01 ENCOUNTER — Ambulatory Visit (INDEPENDENT_AMBULATORY_CARE_PROVIDER_SITE_OTHER): Payer: HMO | Admitting: Adult Health

## 2020-07-01 ENCOUNTER — Encounter (INDEPENDENT_AMBULATORY_CARE_PROVIDER_SITE_OTHER): Payer: Self-pay | Admitting: Adult Health

## 2020-07-01 VITALS — BP 118/78 | HR 72 | Temp 98.6°F | Ht 66.0 in | Wt 266.0 lb

## 2020-07-01 DIAGNOSIS — R7301 Impaired fasting glucose: Secondary | ICD-10-CM | POA: Diagnosis not present

## 2020-07-01 DIAGNOSIS — R7303 Prediabetes: Secondary | ICD-10-CM | POA: Diagnosis not present

## 2020-07-01 DIAGNOSIS — Z6841 Body Mass Index (BMI) 40.0 and over, adult: Secondary | ICD-10-CM | POA: Diagnosis not present

## 2020-07-01 MED ORDER — TRULICITY 0.75 MG/0.5ML ~~LOC~~ SOAJ: 0.7500 mg | SUBCUTANEOUS | 0 refills | Status: DC

## 2020-07-01 NOTE — Telephone Encounter (Signed)
Patient came by the office today to request a sample of Ubrelvy from Dr. Tomi Likens since her insurance is delayed by a prior authorization.

## 2020-07-01 NOTE — Telephone Encounter (Signed)
Medication Samples have been provided to the patient.  Drug name:Urblevy       Strength: 100 mg     Qty:4   [id number] Exp.Date:03/2021  Dosing instructions:   The patient has been instructed regarding the correct time, dose, and frequency of taking this medication, including desired effects and most common side effects.   Venetia Night 3:21 PM 07/01/2020    Pt waiting on pa.

## 2020-07-02 NOTE — Progress Notes (Signed)
Zoriyah Stopher (Key: [id number]) [name] 100MG  tablets   Form [employer] 4-Part Electronic PA Form (2017 NCPDP) Created 18 minutes ago Sent to Plan 12 minutes ago Plan Response 12 minutes ago Submit Clinical Questions less than a minute ago Determination Wait for Determination Please wait for [employer] NCPDP 2017 to return a determination.

## 2020-07-03 NOTE — Progress Notes (Signed)
Anna Walker 100mg  will be approved through 07/02/2001.  [employer] coverage determination [phone].

## 2020-07-07 ENCOUNTER — Other Ambulatory Visit: Payer: Self-pay

## 2020-07-07 DIAGNOSIS — R7301 Impaired fasting glucose: Secondary | ICD-10-CM | POA: Insufficient documentation

## 2020-07-07 MED ORDER — SUMATRIPTAN SUCCINATE 100 MG PO TABS: 100.0000 mg | ORAL_TABLET | ORAL | 5 refills | Status: DC | PRN

## 2020-07-07 NOTE — Progress Notes (Signed)
Chief Complaint:   OBESITY Anna Walker is here to discuss her progress with her obesity treatment plan along with follow-up of her obesity related diagnoses. Anna Walker is on the Category 3 Plan and states she is following her eating plan approximately 50% of the time. Anna Walker states she is not currently exercising.   Today's visit was #: 21 Starting weight: 277 lbs Starting date: 05/01/2019 Today's weight: 266 lbs Today's date: 07/01/2020 Total lbs lost to date: 11 Total lbs lost since last in-office visit: 0  Interim History: Anna Walker has experienced the following issues the last few weeks:  1. R foot stress fracture 2. Increase in chronic R knee pain 3. Experiencing acute migraine. She was treated with injection/oral steroids (last dose >5 days ago).  She has tried to follow category 3 meal plan "as best I can". She experienced increased appetite/cravings while on steroid therapy.  Subjective:   1. Pre-diabetes Epic review reveals several elevated A1c, BG, and insulin levels. Anna Walker has stalled on weight loss. She denies family history of MTC or personal history of pancreatitis.  Lab Results  Component Value Date   HGBA1C 5.9 (H) 04/20/2020   Lab Results  Component Value Date   INSULIN 16.3 04/20/2020   INSULIN 19.6 12/24/2019   INSULIN 23.3 09/17/2019   INSULIN 22.3 05/01/2019    2. Impaired fasting glucose Epic review reveals several elevated BG and A1c's.  Assessment/Plan:   1. Pre-diabetes Anna Walker will continue to work on weight loss, exercise, and decreasing simple carbohydrates to help decrease the risk of diabetes.  -Start Trulicity 7.85 mg once weekly, as prescribed below.  - Dulaglutide (TRULICITY) 8.85 OY/7.7AJ SOPN; Inject 0.75 mg into the skin once a week.  Dispense: 3 mL; Refill: 0  2. Impaired fasting glucose Increase protein and limit carbohydrate/sugar intake. Start Trulicity 2.87 mg once weekly, as prescribed below.  - Dulaglutide  (TRULICITY) 8.67 EH/2.0NO SOPN; Inject 0.75 mg into the skin once a week.  Dispense: 3 mL; Refill: 0  3. Class 3 severe obesity with serious comorbidity and body mass index (BMI) of 40.0 to 44.9 in adult, unspecified obesity type Anna Walker)  Anna Walker is currently in the action stage of change. As such, her goal is to continue with weight loss efforts. She has agreed to the Category 3 Plan.   -Check fasting labs at next OV.  Exercise goals: No exercise has been prescribed at this time. R knee and R foot pain  Behavioral modification strategies: increasing lean protein intake, decreasing simple carbohydrates, meal planning and cooking strategies, keeping healthy foods in the home, better snacking choices, and planning for success.  Anna Walker has agreed to follow-up with our clinic in 3 weeks- fasting. She was informed of the importance of frequent follow-up visits to maximize her success with intensive lifestyle modifications for her multiple health conditions.   Objective:   Blood pressure 118/78, pulse 72, temperature 98.6 F (37 C), height 5\' 6"  (1.676 m), weight 266 lb (120.7 kg), SpO2 98 %. Body mass index is 42.93 kg/m.  General: Cooperative, alert, well developed, in no acute distress. HEENT: Conjunctivae and lids unremarkable. Cardiovascular: Regular rhythm.  Lungs: Normal work of breathing. Neurologic: No focal deficits.   Lab Results  Component Value Date   CREATININE 0.87 06/23/2020   BUN 18 06/23/2020   NA 138 06/23/2020   K 3.4 (L) 06/23/2020   CL 103 06/23/2020   CO2 26 06/23/2020   Lab Results  Component Value Date   ALT 22 04/20/2020  AST 14 04/20/2020   ALKPHOS 83 04/20/2020   BILITOT 0.4 04/20/2020   Lab Results  Component Value Date   HGBA1C 5.9 (H) 04/20/2020   HGBA1C 5.5 12/24/2019   HGBA1C 5.8 (H) 09/17/2019   HGBA1C 6.0 (H) 08/14/2018   HGBA1C 5.9 (H) 02/09/2015   Lab Results  Component Value Date   INSULIN 16.3 04/20/2020   INSULIN 19.6  12/24/2019   INSULIN 23.3 09/17/2019   INSULIN 22.3 05/01/2019   Lab Results  Component Value Date   TSH 1.507 02/09/2015   Lab Results  Component Value Date   CHOL 166 04/20/2020   HDL 57 04/20/2020   LDLCALC 88 04/20/2020   TRIG 117 04/20/2020   CHOLHDL 2.9 04/20/2020   Lab Results  Component Value Date   WBC 7.5 06/23/2020   HGB 12.2 06/23/2020   HCT 38.3 06/23/2020   MCV 86.1 06/23/2020   PLT 260 06/23/2020   No results found for: IRON, TIBC, FERRITIN  Obesity Behavioral Intervention:   Approximately 15 minutes were spent on the discussion below.  ASK: We discussed the diagnosis of obesity with Anna Walker today and Anna Walker agreed to give [place] permission to discuss obesity behavioral modification therapy today.  ASSESS: Anna Walker has the diagnosis of obesity and her BMI today is 42.9. Anna Walker is in the action stage of change.   ADVISE: Anna Walker was educated on the multiple health risks of obesity as well as the benefit of weight loss to improve her health. She was advised of the need for long term treatment and the importance of lifestyle modifications to improve her current health and to decrease her risk of future health problems.  AGREE: Multiple dietary modification options and treatment options were discussed and Anna Walker agreed to follow the recommendations documented in the above note.  ARRANGE: Anna Walker was educated on the importance of frequent visits to treat obesity as outlined per CMS and USPSTF guidelines and agreed to schedule her next follow up appointment today.  Attestation Statements:   Reviewed by clinician on day of visit: allergies, medications, problem list, medical history, surgical history, family history, social history, and previous encounter notes.  Coral Ceo, CMA, am acting as transcriptionist for Mina Marble, NP.  I have reviewed the above documentation for accuracy and completeness, and I agree with the above. -  Anna Walker  d. Anna Larusso, NP-C

## 2020-07-07 NOTE — Progress Notes (Unsigned)
Per Dr.Jaffe, OK to refill sumatriptan

## 2020-07-20 ENCOUNTER — Other Ambulatory Visit: Payer: Self-pay

## 2020-07-20 ENCOUNTER — Ambulatory Visit: Payer: HMO | Admitting: Podiatry

## 2020-07-20 DIAGNOSIS — M79674 Pain in right toe(s): Secondary | ICD-10-CM

## 2020-07-20 DIAGNOSIS — B351 Tinea unguium: Secondary | ICD-10-CM | POA: Diagnosis not present

## 2020-07-20 DIAGNOSIS — M778 Other enthesopathies, not elsewhere classified: Secondary | ICD-10-CM

## 2020-07-20 DIAGNOSIS — B070 Plantar wart: Secondary | ICD-10-CM

## 2020-07-20 DIAGNOSIS — M79675 Pain in left toe(s): Secondary | ICD-10-CM | POA: Diagnosis not present

## 2020-07-20 MED ORDER — TERBINAFINE HCL 250 MG PO TABS: 250.0000 mg | ORAL_TABLET | Freq: Every day | ORAL | 0 refills | Status: DC

## 2020-07-20 NOTE — Progress Notes (Signed)
Subjective: 65 y.o. female presenting today for follow-up evaluation of a symptomatic skin lesion that the patient had to the plantar aspect of the left foot as well as pain and sensitivity to the right forefoot.  Patient states that the right forefoot pain has completely resolved.  She states that the injection completely alleviated her symptoms and pain.  Patient also has a new complaint today regarding thickened painful toenails.  Patient states that she has a Dremel and she files them down herself.  They are very symptomatic and close toed shoes.  She states that they have been like this for several years.  She has not done anything for treatment    Past Medical History:  Diagnosis Date   ADD (attention deficit disorder)    Anxiety    Biliary colic    Bladder leak    Diabetes mellitus without complication (HCC)    type 2 , diet controlled    Diverticular disease    DVT (deep venous thrombosis) (HCC)    left leg , resolved after treatment of blood thinners    Edema of both lower extremities    GERD (gastroesophageal reflux disease)    causes coughing    Glaucoma    High cholesterol    Hypertension    Hypothyroid    Leg cramps    "in my thighs"    Leg cramps    Migraines    Multiple sclerosis ([place])    per patient " that ha sbeen ruled out , they though it was that initially but it did not preogress like it"   Optic neuritis    lesion on right optic nerve; right eye only , reduced vision and blurriness , loss of depth perception, glasses do not help when it flares     OSA (obstructive sleep apnea)    nightly cpap    Paresthesias/numbness    fingers and toes    Pneumonia 2017   Restless leg syndrome    Sleep apnea    SOB (shortness of breath)    Thyroid disease    Hypothyroidism    Objective: Physical Exam General: The patient is alert and oriented x3 in no acute distress.   Dermatology: Hyperkeratotic skin lesion(s) noted to the plantar aspect of the left foot  approximately 0.3cm in diameter.  Hyperkeratotic callus tissue noted around the lesion due to the reaction of the application of Cantharone  Hyperkeratotic dystrophic thickened discolored nails also noted 1-5 bilateral consistent with findings of onychomycosis of the toenails   Vascular: Palpable pedal pulses bilaterally. No edema or erythema noted. Capillary refill within normal limits.   Neurological: Epicritic and protective threshold grossly intact bilaterally.    Musculoskeletal Exam: Negative for pain on palpation to the noted skin lesion(s).    There is also no pain on palpation overlying the fourth metatarsal right foot.  Range of motion within normal limits to all pedal and ankle joints bilateral. Muscle strength 5/5 in all groups bilateral.     Assessment: #1 plantar wart left foot; resolved #2 capsulitis/possible stress fracture right fourth metatarsal; resolved #3 onychomycosis of toenails bilateral great toes  Plan of Care:  #1 Patient was evaluated. #2  Light debridement of the skin lesion was performed using a chisel blade.  Salicylic acid was applied #3 today we discussed different treatment options regarding the onychomycosis of the toenails including oral, topical, and laser antifungal treatment modalities.  After discussing the benefits and disadvantages of each and their relative efficacies the  patient opts for oral antifungal treatment.  She denies a history of liver pathology or symptoms.  Patient does not drink #4 prescription for Lamisil 250 mg #90 daily #5 return to clinic as needed  Edrick Kins, DPM [facility]  Dr. Edrick Kins, DPM    [address], [place] [address]                Office [phone]  Fax [phone]

## 2020-07-23 ENCOUNTER — Ambulatory Visit (INDEPENDENT_AMBULATORY_CARE_PROVIDER_SITE_OTHER): Payer: HMO | Admitting: Bariatrics

## 2020-07-26 ENCOUNTER — Other Ambulatory Visit (INDEPENDENT_AMBULATORY_CARE_PROVIDER_SITE_OTHER): Payer: Self-pay | Admitting: Adult Health

## 2020-07-26 DIAGNOSIS — R7301 Impaired fasting glucose: Secondary | ICD-10-CM

## 2020-07-26 DIAGNOSIS — R7303 Prediabetes: Secondary | ICD-10-CM

## 2020-07-28 NOTE — Telephone Encounter (Signed)
Pt last seen by Katy Danford, FNP.  

## 2020-07-30 ENCOUNTER — Other Ambulatory Visit (INDEPENDENT_AMBULATORY_CARE_PROVIDER_SITE_OTHER): Payer: Self-pay | Admitting: Adult Health

## 2020-07-30 DIAGNOSIS — R7303 Prediabetes: Secondary | ICD-10-CM

## 2020-07-30 DIAGNOSIS — R7301 Impaired fasting glucose: Secondary | ICD-10-CM

## 2020-07-30 NOTE — Telephone Encounter (Signed)
Pt last seen by Katy Danford, FNP.  

## 2020-08-04 MED ORDER — TRULICITY 0.75 MG/0.5ML ~~LOC~~ SOAJ
0.7500 mg | SUBCUTANEOUS | 0 refills | Status: DC
Start: 2020-08-04 — End: 2020-09-02

## 2020-08-04 NOTE — Telephone Encounter (Signed)
Last OV and rx filled 07/01/20, next OV 08/19/20  Would you like to refill?

## 2020-08-10 DIAGNOSIS — R197 Diarrhea, unspecified: Secondary | ICD-10-CM | POA: Diagnosis not present

## 2020-08-10 DIAGNOSIS — R140 Abdominal distension (gaseous): Secondary | ICD-10-CM | POA: Diagnosis not present

## 2020-08-10 DIAGNOSIS — R110 Nausea: Secondary | ICD-10-CM | POA: Diagnosis not present

## 2020-08-10 DIAGNOSIS — R109 Unspecified abdominal pain: Secondary | ICD-10-CM | POA: Diagnosis not present

## 2020-08-12 DIAGNOSIS — R109 Unspecified abdominal pain: Secondary | ICD-10-CM | POA: Diagnosis not present

## 2020-08-13 ENCOUNTER — Other Ambulatory Visit: Payer: Self-pay | Admitting: Physician Assistant

## 2020-08-13 DIAGNOSIS — R1319 Other dysphagia: Secondary | ICD-10-CM | POA: Diagnosis not present

## 2020-08-13 DIAGNOSIS — R197 Diarrhea, unspecified: Secondary | ICD-10-CM | POA: Diagnosis not present

## 2020-08-13 DIAGNOSIS — R1013 Epigastric pain: Secondary | ICD-10-CM

## 2020-08-15 ENCOUNTER — Other Ambulatory Visit: Payer: Self-pay

## 2020-08-15 ENCOUNTER — Emergency Department (HOSPITAL_COMMUNITY): Payer: HMO

## 2020-08-15 ENCOUNTER — Emergency Department (HOSPITAL_COMMUNITY)
Admission: EM | Admit: 2020-08-15 | Discharge: 2020-08-15 | Disposition: A | Discharge status: Home / Self Care | Payer: HMO | Attending: Emergency Medicine | Admitting: Emergency Medicine

## 2020-08-15 DIAGNOSIS — R072 Precordial pain: Secondary | ICD-10-CM | POA: Insufficient documentation

## 2020-08-15 DIAGNOSIS — I10 Essential (primary) hypertension: Secondary | ICD-10-CM | POA: Diagnosis not present

## 2020-08-15 DIAGNOSIS — Z87891 Personal history of nicotine dependence: Secondary | ICD-10-CM | POA: Insufficient documentation

## 2020-08-15 DIAGNOSIS — R0602 Shortness of breath: Secondary | ICD-10-CM | POA: Diagnosis not present

## 2020-08-15 DIAGNOSIS — Z7984 Long term (current) use of oral hypoglycemic drugs: Secondary | ICD-10-CM | POA: Insufficient documentation

## 2020-08-15 DIAGNOSIS — Z7982 Long term (current) use of aspirin: Secondary | ICD-10-CM | POA: Diagnosis not present

## 2020-08-15 DIAGNOSIS — R079 Chest pain, unspecified: Secondary | ICD-10-CM | POA: Diagnosis not present

## 2020-08-15 DIAGNOSIS — K219 Gastro-esophageal reflux disease without esophagitis: Secondary | ICD-10-CM | POA: Insufficient documentation

## 2020-08-15 DIAGNOSIS — R0789 Other chest pain: Secondary | ICD-10-CM

## 2020-08-15 DIAGNOSIS — E119 Type 2 diabetes mellitus without complications: Secondary | ICD-10-CM | POA: Insufficient documentation

## 2020-08-15 DIAGNOSIS — R1013 Epigastric pain: Secondary | ICD-10-CM | POA: Insufficient documentation

## 2020-08-15 DIAGNOSIS — Z79899 Other long term (current) drug therapy: Secondary | ICD-10-CM | POA: Diagnosis not present

## 2020-08-15 DIAGNOSIS — E039 Hypothyroidism, unspecified: Secondary | ICD-10-CM | POA: Insufficient documentation

## 2020-08-15 LAB — CBC
HCT: 40.2 % (ref 36.0–46.0)
Hemoglobin: 13 g/dL (ref 12.0–15.0)
MCH: 27.6 pg (ref 26.0–34.0)
MCHC: 32.3 g/dL (ref 30.0–36.0)
MCV: 85.4 fL (ref 80.0–100.0)
Platelets: 232 10*3/uL (ref 150–400)
RBC: 4.71 MIL/uL (ref 3.87–5.11)
RDW: 13.3 % (ref 11.5–15.5)
WBC: 6.1 10*3/uL (ref 4.0–10.5)
nRBC: 0 % (ref 0.0–0.2)

## 2020-08-15 LAB — BASIC METABOLIC PANEL
Anion gap: 7 (ref 5–15)
BUN: 12 mg/dL (ref 8–23)
CO2: 26 mmol/L (ref 22–32)
Calcium: 9.1 mg/dL (ref 8.9–10.3)
Chloride: 104 mmol/L (ref 98–111)
Creatinine, Ser: 0.95 mg/dL (ref 0.44–1.00)
GFR, Estimated: >60 mL/min (ref >60)
Glucose, Bld: 113 mg/dL — ABNORMAL HIGH (ref 70–99)
Potassium: 3.9 mmol/L (ref 3.5–5.1)
Sodium: 137 mmol/L (ref 135–145)

## 2020-08-15 LAB — TROPONIN I (HIGH SENSITIVITY)
Troponin I (High Sensitivity): 4 ng/L (ref <18)
Troponin I (High Sensitivity): 3 ng/L (ref <18)

## 2020-08-15 LAB — LIPASE, BLOOD: Lipase: 24 U/L (ref 11–51)

## 2020-08-15 LAB — HEPATIC FUNCTION PANEL
ALT: 22 U/L (ref 0–44)
AST: 19 U/L (ref 15–41)
Albumin: 3.9 g/dL (ref 3.5–5.0)
Alkaline Phosphatase: 65 U/L (ref 38–126)
Bilirubin, Direct: 0.2 mg/dL (ref 0.0–0.2)
Indirect Bilirubin: 1 mg/dL — ABNORMAL HIGH (ref 0.3–0.9)
Total Bilirubin: 1.2 mg/dL (ref 0.3–1.2)
Total Protein: 6.9 g/dL (ref 6.5–8.1)

## 2020-08-15 NOTE — ED Provider Notes (Signed)
[facility] EMERGENCY DEPARTMENT Provider Note   CSN: [id number] Arrival date & time: 08/15/20  [id number]     History Chief Complaint  Patient presents with   Shortness of Breath   Chest Pain    Anna Walker is a 65 y.o. female.  Patient with history of diabetes and has an high blood pressure high cholesterol, presents with chest pain and shortness of breath that started at rest today.  She describes it is in the epigastric region discomfort that radiates up to her chest.  She had intermittent epigastric pain throughout the last 2 weeks but today its been radiating up to her chest now.  Denies any fevers or cough.  No vomiting.  She had diarrhea a few days ago.      Past Medical History:  Diagnosis Date   ADD (attention deficit disorder)    Anxiety    Biliary colic    Bladder leak    Diabetes mellitus without complication ([place])    type 2 , diet controlled    Diverticular disease    DVT (deep venous thrombosis) (HCC)    left leg , resolved after treatment of blood thinners    Edema of both lower extremities    GERD (gastroesophageal reflux disease)    causes coughing    Glaucoma    High cholesterol    Hypertension    Hypothyroid    Leg cramps    "in my thighs"    Leg cramps    Migraines    Multiple sclerosis ([place])    per patient " that ha sbeen ruled out , they though it was that initially but it did not preogress like it"   Optic neuritis    lesion on right optic nerve; right eye only , reduced vision and blurriness , loss of depth perception, glasses do not help when it flares     OSA (obstructive sleep apnea)    nightly cpap    Paresthesias/numbness    fingers and toes    Pneumonia 2017   Restless leg syndrome    Sleep apnea    SOB (shortness of breath)    Thyroid disease    Hypothyroidism    Patient Active Problem List   Diagnosis Date Noted   Impaired fasting glucose 07/07/2020   Other hyperlipidemia 04/21/2020   Prediabetes  03/31/2020   Essential hypertension 03/31/2020   Vitamin D deficiency 05/02/2019   Class 3 severe obesity due to excess calories with serious comorbidity and body mass index (BMI) of 40.0 to 44.9 in adult ([place]) 05/01/2019   Chronic pain of both knees 04/25/2017   DOE (dyspnea on exertion)    Substernal chest pain 02/08/2015   Attention deficit disorder 12/22/2014   History of optic neuritis 06/20/2014   OSA on CPAP 06/20/2014   Restless leg syndrome 06/20/2014   Other fatigue 06/20/2014   Migraine without aura and without status migrainosus, not intractable 06/20/2014   possible MS 02/24/2014   Hypothyroidism 02/24/2014   GERD (gastroesophageal reflux disease) 02/24/2014    Past Surgical History:  Procedure Laterality Date   ABDOMINAL HYSTERECTOMY     BREAST LUMPECTOMY Left    CESAREAN SECTION     x2    CHOLECYSTECTOMY N/A 08/16/2018   Procedure: LAPAROSCOPIC CHOLECYSTECTOMY;  Surgeon: Greer Pickerel, MD;  Location: [facility];  Service: General;  Laterality: N/A;   COLONOSCOPY WITH PROPOFOL N/A 01/03/2019   Procedure: COLONOSCOPY WITH PROPOFOL;  Surgeon: Wonda Horner, MD;  Location: [facility];  Service: Endoscopy;  Laterality: N/A;   MENISCUS REPAIR Left left   MENISCUS REPAIR Right    knee    TUBAL LIGATION     during 2nd c section      OB History     Gravida  2   Para  2   Term      Preterm      AB      Living         SAB      IAB      Ectopic      Multiple      Live Births              Family History  Problem Relation Age of Onset   Barrett's esophagus Mother    Hypertension Mother    Obesity Mother    Berenice Primas' disease Sister    Rheum arthritis Sister    Heart attack Father 82   Sudden death Father    Coronary artery disease Neg Hx     Social History   Tobacco Use   Smoking status: Former    Types: Cigarettes    Quit date: 01/25/1984    Years since quitting: 36.5   Smokeless tobacco: Never  Vaping Use   Vaping Use: Never used   Substance Use Topics   Alcohol use: Not Currently   Drug use: No    Home Medications Prior to Admission medications   Medication Sig Start Date End Date Taking? Authorizing Provider  acetaminophen (TYLENOL) 500 MG tablet Take 500 mg by mouth every 6 (six) hours as needed.    [provider]  amantadine (SYMMETREL) 100 MG capsule TAKE 1 CAPSULE(100 MG) BY MOUTH THREE TIMES DAILY 11/25/19   Tomi Likens, Adam R, DO  ASPERCREME LIDOCAINE EX Apply topically.    [provider]  aspirin 81 MG EC tablet Take 81 mg by mouth daily. Swallow whole.    [provider]  Dulaglutide (TRULICITY) A999333 0000000 SOPN Inject 0.75 mg into the skin once a week. 08/04/20   Danford, Valetta Fuller D, NP  FLUoxetine (PROZAC) 10 MG capsule Take 10 mg by mouth at bedtime. Take with 20 mg dose 03/19/17   [provider]  FLUoxetine (PROZAC) 20 MG capsule Take 20 mg by mouth at bedtime. Take with 10 mg dose 06/21/18   [provider]  gabapentin (NEURONTIN) 300 MG capsule TAKE 1 CAPSULE BY MOUTH AT [facility] AND 2 CAPSULES AT BEDTIME 06/15/20   Tomi Likens, Adam R, DO  hydrochlorothiazide (HYDRODIURIL) 12.5 MG tablet Take 12.5 mg by mouth daily.  02/06/17   [provider]  levothyroxine (SYNTHROID, LEVOTHROID) 150 MCG tablet Take 150 mcg by mouth daily before breakfast.    [provider]  meloxicam (MOBIC) 15 MG tablet Take 1 tablet (15 mg total) by mouth daily. 06/15/20   Edrick Kins, DPM  omeprazole (PRILOSEC) 40 MG capsule Take 1 capsule (40 mg total) by mouth 2 (two) times daily before a meal. 02/10/15   Barton Dubois, MD  oxybutynin (DITROPAN) 5 MG tablet Take 5 mg by mouth at bedtime.     [provider]  rOPINIRole (REQUIP) 3 MG tablet Take '3mg'$  at dinner if needed and '3mg'$  at bedtime 06/03/20   Pieter Partridge, DO  simvastatin (ZOCOR) 10 MG tablet Take 10 mg by mouth every evening.     [provider]  SUMAtriptan (IMITREX) 100 MG tablet Take 1 tablet (100 mg  total) by mouth every  2 (two) hours as needed for migraine. May repeat in 2 hours if headache persists or recurs. 07/07/20   Pieter Partridge, DO  terbinafine (LAMISIL) 250 MG tablet Take 1 tablet (250 mg total) by mouth daily. 07/20/20   Edrick Kins, DPM  Ubrogepant (UBRELVY) 100 MG TABS Take 1 tablet earliest onset of migraine.  May repeat in 2 hours.  Maximum 2 tablets in 24 hours 06/29/20   Pieter Partridge, DO  Vitamin D, Ergocalciferol, (DRISDOL) 1.25 MG (50000 UNIT) CAPS capsule Take 1 capsule (50,000 Units total) by mouth every 7 (seven) days. 06/11/20   Jearld Lesch A, DO    Allergies    Adderall [amphetamine-dextroamphetamine] and Topamax [topiramate]  Review of Systems   Review of Systems  Constitutional:  Negative for fever.  HENT:  Negative for ear pain.   Eyes:  Negative for pain.  Respiratory:  Negative for cough.   Cardiovascular:  Positive for chest pain.  Gastrointestinal:  Positive for abdominal pain.  Genitourinary:  Negative for flank pain.  Musculoskeletal:  Negative for back pain.  Skin:  Negative for rash.  Neurological:  Negative for headaches.   Physical Exam Updated Vital Signs BP (!) 101/59   Pulse 80   Temp 99.4 F (37.4 C) (Oral)   Resp 17   Ht '5\' 6"'$  (1.676 m)   Wt 117 kg   SpO2 97%   BMI 41.64 kg/m   Physical Exam Constitutional:      General: She is not in acute distress.    Appearance: Normal appearance.  HENT:     Head: Normocephalic.     Nose: Nose normal.  Eyes:     Extraocular Movements: Extraocular movements intact.  Cardiovascular:     Rate and Rhythm: Normal rate.  Pulmonary:     Effort: Pulmonary effort is normal.  Abdominal:     Tenderness: There is no abdominal tenderness. There is no guarding or rebound.  Musculoskeletal:        General: Normal range of motion.     Cervical back: Normal range of motion.  Neurological:     General: No focal deficit present.     Mental Status: She is alert. Mental status is at baseline.    ED  Results / Procedures / Treatments   Labs (all labs ordered are listed, but only abnormal results are displayed) Labs Reviewed  BASIC METABOLIC PANEL - Abnormal; Notable for the following components:      Result Value   Glucose, Bld 113 (*)    All other components within normal limits  HEPATIC FUNCTION PANEL - Abnormal; Notable for the following components:   Indirect Bilirubin 1.0 (*)    All other components within normal limits  CBC  LIPASE, BLOOD  TROPONIN I (HIGH SENSITIVITY)  TROPONIN I (HIGH SENSITIVITY)    EKG EKG Interpretation  Date/Time:  Saturday August 15 2020 09:44:55 EDT Ventricular Rate:  94 PR Interval:  132 QRS Duration: 84 QT Interval:  250 QTC Calculation: 312 R Axis:   82 Text Interpretation: Normal sinus rhythm Nonspecific T wave abnormality Abnormal ECG Confirmed by Thamas Jaegers ([id number]) on 08/15/2020 4:28:33 PM  Radiology DG Chest 2 View  Result Date: 08/15/2020 CLINICAL DATA:  Shortness of breath, chest pain. EXAM: CHEST - 2 VIEW COMPARISON:  01/27/2019 FINDINGS: Both lungs are clear. Negative for a pneumothorax. Heart and mediastinum are within normal limits. Trachea is midline. Degenerative changes in the mid thoracic spine. No large pleural effusions. Small round density in  the left upper chest represents an ECG skin lead. IMPRESSION: No active cardiopulmonary disease. Electronically Signed   By: Markus Daft M.D.   On: 08/15/2020 10:08    Procedures Procedures   Medications Ordered in ED Medications - No data to display  ED Course  I have reviewed the triage vital signs and the nursing notes.  Pertinent labs & imaging results that were available during my care of the patient were reviewed by me and considered in my medical decision making (see chart for details).    MDM Rules/Calculators/A&P                           Labs sent unremarkable white count normal hemoglobin normal chemistry normal lipase normal.  EKG shows sinus rhythm no ST  elevations depressions, rate is normal.  2 sets of troponin sent both are negative.  Patient's heart rate initially 95 but at rest here its 80 to 89 bpm.  No peripheral edema or leg swelling noted.  I doubt pulmonary embolism.  Given negative work-up today recommending follow-up again with her cardiologist within the week.  Recommend immediate return for worsening pain fevers or any additional concerns.  Final Clinical Impression(s) / ED Diagnoses Final diagnoses:  Midsternal chest pain    Rx / DC Orders ED Discharge Orders     None        Luna Fuse, MD 08/15/20 1723

## 2020-08-15 NOTE — Discharge Instructions (Addendum)
Call your primary care doctor or specialist as discussed in the next 2-3 days.   Return immediately back to the ER if:  Your symptoms worsen within the next 12-24 hours. You develop new symptoms such as new fevers, persistent vomiting, new pain, shortness of breath, or new weakness or numbness, or if you have any other concerns.  

## 2020-08-15 NOTE — ED Triage Notes (Signed)
Pt reports waking up with shortness of breath, even at rest, with chest pressure radiating through to her back this morning. Has been sick with GI bug (diarrhea and abdominal pain) x 2 weeks, has been seen by GI for same and is on new medications.

## 2020-08-17 ENCOUNTER — Ambulatory Visit (INDEPENDENT_AMBULATORY_CARE_PROVIDER_SITE_OTHER): Payer: HMO | Admitting: Adult Health

## 2020-08-17 DIAGNOSIS — Z03818 Encounter for observation for suspected exposure to other biological agents ruled out: Secondary | ICD-10-CM | POA: Diagnosis not present

## 2020-08-17 DIAGNOSIS — E039 Hypothyroidism, unspecified: Secondary | ICD-10-CM | POA: Diagnosis not present

## 2020-08-17 DIAGNOSIS — J45909 Unspecified asthma, uncomplicated: Secondary | ICD-10-CM | POA: Diagnosis not present

## 2020-08-17 DIAGNOSIS — R059 Cough, unspecified: Secondary | ICD-10-CM | POA: Diagnosis not present

## 2020-08-19 ENCOUNTER — Ambulatory Visit (INDEPENDENT_AMBULATORY_CARE_PROVIDER_SITE_OTHER): Payer: HMO | Admitting: Bariatrics

## 2020-08-20 ENCOUNTER — Emergency Department (HOSPITAL_BASED_OUTPATIENT_CLINIC_OR_DEPARTMENT_OTHER)
Admission: EM | Admit: 2020-08-20 | Discharge: 2020-08-20 | Disposition: A | Discharge status: Home / Self Care | Payer: HMO | Attending: Emergency Medicine | Admitting: Emergency Medicine

## 2020-08-20 ENCOUNTER — Emergency Department (HOSPITAL_BASED_OUTPATIENT_CLINIC_OR_DEPARTMENT_OTHER): Payer: HMO

## 2020-08-20 ENCOUNTER — Other Ambulatory Visit: Payer: Self-pay

## 2020-08-20 ENCOUNTER — Encounter (HOSPITAL_BASED_OUTPATIENT_CLINIC_OR_DEPARTMENT_OTHER): Payer: Self-pay | Admitting: *Deleted

## 2020-08-20 DIAGNOSIS — E039 Hypothyroidism, unspecified: Secondary | ICD-10-CM | POA: Diagnosis not present

## 2020-08-20 DIAGNOSIS — E119 Type 2 diabetes mellitus without complications: Secondary | ICD-10-CM | POA: Diagnosis not present

## 2020-08-20 DIAGNOSIS — Z7982 Long term (current) use of aspirin: Secondary | ICD-10-CM | POA: Diagnosis not present

## 2020-08-20 DIAGNOSIS — Z87891 Personal history of nicotine dependence: Secondary | ICD-10-CM | POA: Insufficient documentation

## 2020-08-20 DIAGNOSIS — R140 Abdominal distension (gaseous): Secondary | ICD-10-CM | POA: Diagnosis not present

## 2020-08-20 DIAGNOSIS — Z79899 Other long term (current) drug therapy: Secondary | ICD-10-CM | POA: Insufficient documentation

## 2020-08-20 DIAGNOSIS — K219 Gastro-esophageal reflux disease without esophagitis: Secondary | ICD-10-CM | POA: Insufficient documentation

## 2020-08-20 DIAGNOSIS — R1013 Epigastric pain: Secondary | ICD-10-CM | POA: Diagnosis not present

## 2020-08-20 DIAGNOSIS — R1010 Upper abdominal pain, unspecified: Secondary | ICD-10-CM | POA: Diagnosis present

## 2020-08-20 DIAGNOSIS — Z794 Long term (current) use of insulin: Secondary | ICD-10-CM | POA: Diagnosis not present

## 2020-08-20 DIAGNOSIS — I10 Essential (primary) hypertension: Secondary | ICD-10-CM | POA: Insufficient documentation

## 2020-08-20 DIAGNOSIS — K5730 Diverticulosis of large intestine without perforation or abscess without bleeding: Secondary | ICD-10-CM | POA: Diagnosis not present

## 2020-08-20 LAB — LIPASE, BLOOD: Lipase: 11 U/L (ref 11–51)

## 2020-08-20 LAB — COMPREHENSIVE METABOLIC PANEL
ALT: 14 U/L (ref 0–44)
AST: 11 U/L — ABNORMAL LOW (ref 15–41)
Albumin: 4.1 g/dL (ref 3.5–5.0)
Alkaline Phosphatase: 62 U/L (ref 38–126)
Anion gap: 10 (ref 5–15)
BUN: 15 mg/dL (ref 8–23)
CO2: 28 mmol/L (ref 22–32)
Calcium: 9 mg/dL (ref 8.9–10.3)
Chloride: 101 mmol/L (ref 98–111)
Creatinine, Ser: 0.82 mg/dL (ref 0.44–1.00)
GFR, Estimated: >60 mL/min (ref >60)
Glucose, Bld: 113 mg/dL — ABNORMAL HIGH (ref 70–99)
Potassium: 3.1 mmol/L — ABNORMAL LOW (ref 3.5–5.1)
Sodium: 139 mmol/L (ref 135–145)
Total Bilirubin: 0.5 mg/dL (ref 0.3–1.2)
Total Protein: 6.8 g/dL (ref 6.5–8.1)

## 2020-08-20 LAB — CBC WITH DIFFERENTIAL/PLATELET
Abs Immature Granulocytes: 0.1 10*3/uL — ABNORMAL HIGH (ref 0.00–0.07)
Basophils Absolute: 0 10*3/uL (ref 0.0–0.1)
Basophils Relative: 0 %
Eosinophils Absolute: 0 10*3/uL (ref 0.0–0.5)
Eosinophils Relative: 0 %
HCT: 39.5 % (ref 36.0–46.0)
Hemoglobin: 12.8 g/dL (ref 12.0–15.0)
Immature Granulocytes: 1 %
Lymphocytes Relative: 16 %
Lymphs Abs: 1.5 10*3/uL (ref 0.7–4.0)
MCH: 27.1 pg (ref 26.0–34.0)
MCHC: 32.4 g/dL (ref 30.0–36.0)
MCV: 83.7 fL (ref 80.0–100.0)
Monocytes Absolute: 0.5 10*3/uL (ref 0.1–1.0)
Monocytes Relative: 5 %
Neutro Abs: 7.2 10*3/uL (ref 1.7–7.7)
Neutrophils Relative %: 78 %
Platelets: 281 10*3/uL (ref 150–400)
RBC: 4.72 MIL/uL (ref 3.87–5.11)
RDW: 13.8 % (ref 11.5–15.5)
WBC: 9.3 10*3/uL (ref 4.0–10.5)
nRBC: 0 % (ref 0.0–0.2)

## 2020-08-20 LAB — URINALYSIS, ROUTINE W REFLEX MICROSCOPIC
Bilirubin Urine: NEGATIVE
Glucose, UA: NEGATIVE mg/dL
Hgb urine dipstick: NEGATIVE
Ketones, ur: NEGATIVE mg/dL
Leukocytes,Ua: NEGATIVE
Nitrite: NEGATIVE
Specific Gravity, Urine: 1.022 (ref 1.005–1.030)
pH: 6.5 (ref 5.0–8.0)

## 2020-08-20 LAB — I-STAT CREATININE, ED: Creatinine, Ser: 0.8 mg/dL (ref 0.44–1.00)

## 2020-08-20 MED ORDER — IOHEXOL 9 MG/ML PO SOLN
500.0000 mL | Freq: Once | ORAL | Status: AC | PRN
  Administered 2020-08-20: 500 mL via ORAL

## 2020-08-20 MED ORDER — IOHEXOL 300 MG/ML  SOLN
75.0000 mL | Freq: Once | INTRAMUSCULAR | Status: AC | PRN
  Administered 2020-08-20: 75 mL via INTRAVENOUS

## 2020-08-20 MED ORDER — PANTOPRAZOLE SODIUM 40 MG IV SOLR
40.0000 mg | Freq: Once | INTRAVENOUS | Status: AC
  Administered 2020-08-20: 40 mg via INTRAVENOUS
  Filled 2020-08-20: qty 40

## 2020-08-20 MED ORDER — SODIUM CHLORIDE 0.9 % IV SOLN: Freq: Once | INTRAVENOUS | Status: AC

## 2020-08-20 MED ORDER — IOHEXOL 9 MG/ML PO SOLN: 500.0000 mL | ORAL | Status: DC

## 2020-08-20 NOTE — ED Provider Notes (Signed)
[facility] EMERGENCY DEPT Provider Note   CSN: [id number] Arrival date & time: 08/20/20  1018     History Chief Complaint  Patient presents with   Abdominal Pain    Anna Walker is a 65 y.o. female.  HPI Patient reports she been having problems upper abdominal pain for about 3 weeks.  She reports she has a lot of pain after eating.  She is also getting pain at other times and radiating into her back.  There is a burning and fullness quality.  No vomiting.  No diarrhea.  No urinary symptoms.  Patient has been seen by gastroenterology and had adjustments made to her GI medications.  She is not getting any relief of symptoms.  However patient has not yet tried her HyCoSy amine.  She is also has prescriptions for Protonix and Carafate.    Past Medical History:  Diagnosis Date   ADD (attention deficit disorder)    Anxiety    Biliary colic    Bladder leak    Diabetes mellitus without complication ([place])    type 2 , diet controlled    Diverticular disease    DVT (deep venous thrombosis) (HCC)    left leg , resolved after treatment of blood thinners    Edema of both lower extremities    GERD (gastroesophageal reflux disease)    causes coughing    Glaucoma    High cholesterol    Hypertension    Hypothyroid    Leg cramps    "in my thighs"    Leg cramps    Migraines    Multiple sclerosis ([place])    per patient " that ha sbeen ruled out , they though it was that initially but it did not preogress like it"   Optic neuritis    lesion on right optic nerve; right eye only , reduced vision and blurriness , loss of depth perception, glasses do not help when it flares     OSA (obstructive sleep apnea)    nightly cpap    Paresthesias/numbness    fingers and toes    Pneumonia 2017   Restless leg syndrome    Sleep apnea    SOB (shortness of breath)    Thyroid disease    Hypothyroidism    Patient Active Problem List   Diagnosis Date Noted   Impaired fasting  glucose 07/07/2020   Other hyperlipidemia 04/21/2020   Prediabetes 03/31/2020   Essential hypertension 03/31/2020   Vitamin D deficiency 05/02/2019   Class 3 severe obesity due to excess calories with serious comorbidity and body mass index (BMI) of 40.0 to 44.9 in adult ([place]) 05/01/2019   Chronic pain of both knees 04/25/2017   DOE (dyspnea on exertion)    Substernal chest pain 02/08/2015   Attention deficit disorder 12/22/2014   History of optic neuritis 06/20/2014   OSA on CPAP 06/20/2014   Restless leg syndrome 06/20/2014   Other fatigue 06/20/2014   Migraine without aura and without status migrainosus, not intractable 06/20/2014   possible MS 02/24/2014   Hypothyroidism 02/24/2014   GERD (gastroesophageal reflux disease) 02/24/2014    Past Surgical History:  Procedure Laterality Date   ABDOMINAL HYSTERECTOMY     BREAST LUMPECTOMY Left    CESAREAN SECTION     x2    CHOLECYSTECTOMY N/A 08/16/2018   Procedure: LAPAROSCOPIC CHOLECYSTECTOMY;  Surgeon: Greer Pickerel, MD;  Location: [facility];  Service: General;  Laterality: N/A;   COLONOSCOPY WITH PROPOFOL N/A 01/03/2019   Procedure:  COLONOSCOPY WITH PROPOFOL;  Surgeon: Wonda Horner, MD;  Location: [facility];  Service: Endoscopy;  Laterality: N/A;   MENISCUS REPAIR Left left   MENISCUS REPAIR Right    knee    TUBAL LIGATION     during 2nd c section      OB History     Gravida  2   Para  2   Term      Preterm      AB      Living         SAB      IAB      Ectopic      Multiple      Live Births              Family History  Problem Relation Age of Onset   Barrett's esophagus Mother    Hypertension Mother    Obesity Mother    Berenice Primas' disease Sister    Rheum arthritis Sister    Heart attack Father 4   Sudden death Father    Coronary artery disease Neg Hx     Social History   Tobacco Use   Smoking status: Former    Types: Cigarettes    Quit date: 01/25/1984    Years since quitting: 36.5    Smokeless tobacco: Never  Vaping Use   Vaping Use: Never used  Substance Use Topics   Alcohol use: Not Currently   Drug use: No    Home Medications Prior to Admission medications   Medication Sig Start Date End Date Taking? Authorizing Provider  acetaminophen (TYLENOL) 500 MG tablet Take 500 mg by mouth every 6 (six) hours as needed.   Yes [provider]  amantadine (SYMMETREL) 100 MG capsule TAKE 1 CAPSULE(100 MG) BY MOUTH THREE TIMES DAILY 11/25/19  Yes Tomi Likens, Adam R, DO  aspirin 81 MG EC tablet Take 81 mg by mouth daily. Swallow whole.   Yes [provider]  Dulaglutide (TRULICITY) A999333 0000000 SOPN Inject 0.75 mg into the skin once a week. 08/04/20  Yes Danford, Valetta Fuller D, NP  FLUoxetine (PROZAC) 10 MG capsule Take 10 mg by mouth at bedtime. Take with 20 mg dose 03/19/17  Yes [provider]  FLUoxetine (PROZAC) 20 MG capsule Take 20 mg by mouth at bedtime. Take with 10 mg dose 06/21/18  Yes [provider]  gabapentin (NEURONTIN) 300 MG capsule TAKE 1 CAPSULE BY MOUTH AT [facility] AND 2 CAPSULES AT BEDTIME 06/15/20  Yes Jaffe, Adam R, DO  hydrochlorothiazide (HYDRODIURIL) 12.5 MG tablet Take 12.5 mg by mouth daily.  02/06/17  Yes [provider]  levothyroxine (SYNTHROID, LEVOTHROID) 150 MCG tablet Take 150 mcg by mouth daily before breakfast.   Yes [provider]  oxybutynin (DITROPAN) 5 MG tablet Take 5 mg by mouth at bedtime.    Yes [provider]  rOPINIRole (REQUIP) 3 MG tablet Take '3mg'$  at dinner if needed and '3mg'$  at bedtime 06/03/20  Yes Jaffe, Adam R, DO  simvastatin (ZOCOR) 10 MG tablet Take 10 mg by mouth every evening.    Yes [provider]  Vitamin D, Ergocalciferol, (DRISDOL) 1.25 MG (50000 UNIT) CAPS capsule Take 1 capsule (50,000 Units total) by mouth every 7 (seven) days. 06/11/20  Yes Jearld Lesch A, DO  ASPERCREME LIDOCAINE EX Apply topically.    [provider]  meloxicam (MOBIC) 15 MG tablet Take  1 tablet (15 mg total) by mouth daily. 06/15/20   Edrick Kins, DPM  omeprazole (PRILOSEC) 40 MG capsule Take 1 capsule (40 mg total) by mouth 2 (two) times daily before a meal. 02/10/15   Barton Dubois, MD  SUMAtriptan (IMITREX) 100 MG tablet Take 1 tablet (100 mg total) by mouth every 2 (two) hours as needed for migraine. May repeat in 2 hours if headache persists or recurs. 07/07/20   Pieter Partridge, DO  terbinafine (LAMISIL) 250 MG tablet Take 1 tablet (250 mg total) by mouth daily. 07/20/20   Edrick Kins, DPM  Ubrogepant (UBRELVY) 100 MG TABS Take 1 tablet earliest onset of migraine.  May repeat in 2 hours.  Maximum 2 tablets in 24 hours 06/29/20   Pieter Partridge, DO    Allergies    Adderall [amphetamine-dextroamphetamine] and Topamax [topiramate]  Review of Systems   Review of Systems 10 systems reviewed negative except for HPI Physical Exam Updated Vital Signs BP 114/61   Pulse 79   Temp 98.5 F (36.9 C)   Resp 16   Ht '5\' 6"'$  (1.676 m)   Wt 116.1 kg   SpO2 95%   BMI 41.32 kg/m   Physical Exam Constitutional:      Comments: Alert nontoxic no acute distress  HENT:     Mouth/Throat:     Pharynx: Oropharynx is clear.  Eyes:     Extraocular Movements: Extraocular movements intact.  Cardiovascular:     Rate and Rhythm: Normal rate and regular rhythm.  Pulmonary:     Effort: Pulmonary effort is normal.     Breath sounds: Normal breath sounds.  Abdominal:     Comments: Epigastric pain to palpation.  No guarding.  Lower abdomen nontender.  No rashes or soft tissue changes  Musculoskeletal:        General: Normal range of motion.     Right lower leg: No edema.     Left lower leg: No edema.  Skin:    General: Skin is warm and dry.  Neurological:     General: No focal deficit present.     Mental Status: She is oriented to person, place, and time.  Psychiatric:        Mood and Affect: Mood normal.    ED Results / Procedures / Treatments   Labs (all labs ordered are  listed, but only abnormal results are displayed) Labs Reviewed - No data to display  EKG None  Radiology No results found.  Procedures Procedures   Medications Ordered in ED Medications - No data to display  ED Course  I have reviewed the triage vital signs and the nursing notes.  Pertinent labs & imaging results that were available during my care of the patient were reviewed by me and considered in my medical decision making (see chart for details).    MDM Rules/Calculators/A&P                           Patient is clinically well in appearance.  Abdominal examination is nonsurgical.  CT does not show acute findings.  At this time most consistent with GERD or gastritis.  Patient already has prescriptions for Protonix and Carafate.  Recommend twice daily Pepcid if needed.  Close follow-up with gastroenterology.  Patient discharged in good condition. Final Clinical Impression(s) / ED Diagnoses Final diagnoses:  Epigastric pain    Rx / DC Orders ED Discharge Orders     None        Charlesetta Shanks, MD 08/20/20 [id number]

## 2020-08-20 NOTE — ED Notes (Signed)
Back from CT

## 2020-08-20 NOTE — ED Triage Notes (Addendum)
Epigastric Pain for about 3 weeks, saw GI, was started on meds last Thursday. No improvement, Pain worse with eating. Pt places hand over epigastric area when asked to identify area. N/D without vomiting. Called yesterday to GI and was told to come to the ED.  Pt goes on to say she went to the ER and PCP within last week for same. Dx with RSV related to her cold symptoms, added on during triage.

## 2020-08-20 NOTE — Discharge Instructions (Addendum)
1.  Your CT scan did not show any abnormality for your abdominal pain.  This is most likely due to reflux and gastritis.  You have been given medications from your gastroenterologist to take.  Use those medications as prescribed.  You may also take over-the-counter Pepcid twice daily until your medications are working.  Follow instructions for gastroesophageal reflux disease. 2.  Follow-up with your doctor for recheck within the next 3 to 5 days

## 2020-08-20 NOTE — ED Notes (Signed)
Patient transported to CT 

## 2020-08-24 DIAGNOSIS — R197 Diarrhea, unspecified: Secondary | ICD-10-CM | POA: Diagnosis not present

## 2020-09-02 ENCOUNTER — Other Ambulatory Visit (INDEPENDENT_AMBULATORY_CARE_PROVIDER_SITE_OTHER): Payer: Self-pay | Admitting: Adult Health

## 2020-09-02 ENCOUNTER — Other Ambulatory Visit: Payer: HMO

## 2020-09-02 DIAGNOSIS — R7301 Impaired fasting glucose: Secondary | ICD-10-CM

## 2020-09-02 DIAGNOSIS — R7303 Prediabetes: Secondary | ICD-10-CM

## 2020-09-02 NOTE — Telephone Encounter (Signed)
LAST APPOINTMENT DATE: 07/01/2020 NEXT APPOINTMENT DATE: 09/07/2020   [facility] [id number], [place] [address] Phone: [phone] Fax: [phone]  Patient is requesting a refill of the following medications: Pending Prescriptions:                       Disp   Refills   TRULICITY [id number] SOPN [Pharmacy Med*2 mL           Sig: ADMINISTER 0.75 MG UNDER THE SKIN 1 TIME A WEEK   Date last filled: 08/04/2020 Previously prescribed by Mina Marble  Lab Results      Component                Value               Date                      HGBA1C                   5.9 (H)             04/20/2020                HGBA1C                   5.5                 12/24/2019                HGBA1C                   5.8 (H)             09/17/2019           Lab Results      Component                Value               Date                      LDLCALC                  88                  04/20/2020                CREATININE               0.82                08/20/2020           Lab Results      Component                Value               Date                      VD25OH                   33.4                04/20/2020                VD25OH  31.9                12/24/2019                VD25OH                   34.5                09/17/2019            BP Readings from Last 3 Encounters: 08/20/20 : 126/80 08/15/20 : 129/67 07/01/20 : 118/78  0

## 2020-09-07 ENCOUNTER — Encounter (INDEPENDENT_AMBULATORY_CARE_PROVIDER_SITE_OTHER): Payer: Self-pay | Admitting: Adult Health

## 2020-09-07 ENCOUNTER — Ambulatory Visit (INDEPENDENT_AMBULATORY_CARE_PROVIDER_SITE_OTHER): Payer: HMO | Admitting: Adult Health

## 2020-09-07 ENCOUNTER — Other Ambulatory Visit: Payer: Self-pay

## 2020-09-07 VITALS — BP 113/66 | HR 75 | Temp 98.0°F | Ht 66.0 in | Wt 259.0 lb

## 2020-09-07 DIAGNOSIS — R7303 Prediabetes: Secondary | ICD-10-CM

## 2020-09-07 DIAGNOSIS — Z6841 Body Mass Index (BMI) 40.0 and over, adult: Secondary | ICD-10-CM | POA: Diagnosis not present

## 2020-09-07 DIAGNOSIS — K219 Gastro-esophageal reflux disease without esophagitis: Secondary | ICD-10-CM

## 2020-09-08 NOTE — Progress Notes (Signed)
Chief Complaint:   OBESITY Anna Walker is here to discuss her progress with her obesity treatment plan along with follow-up of her obesity related diagnoses. Anna Walker is on the Category 3 Plan and states she is following her eating plan approximately 0% of the time. Anna Walker states she is doing 0 minutes 0 times per week.  Today's visit was #: 22 Starting weight: 277 lbs Starting date: 05/01/2019 Today's weight: 259 lbs Today's date: 09/07/2020 Total lbs lost to date: 18 Total lbs lost since last in-office visit: 7  Interim History:  Anna Walker experienced epigastric pain on 08/20/2020.  She recently started on pantoprazole 40 mg q daily, Sucralfate 1 gram QD , Pepcid OTC QD, and Hyoscyamine 0.125 as needed.  Her CT was stable on 08/20/2020. She is down 7 lbs due to GI upset.  Of note: [facility] did labs, stool study, and thyroid workup in July. Abnormal TSH, and her PCP was informed and increased her levothyroxine.  Subjective:   1. Gastroesophageal reflux disease, unspecified whether esophagitis present EDG was done on 11/19/2020. Anna Walker experienced epigastric pain on 08/20/2020.  She recently started on pantoprazole 40 mg q daily, Sucralfate 1 gram QD , Pepcid OTC QD, and Hyoscyamine 0.125 as needed.  Her CT was stable on 08/20/2020. She is down 7 lbs due to GI upset.  2. Pre-diabetes 08/14/19- 123456 6.0 Off Trulicity due to recent GI upset and awaiting work-up.    Assessment/Plan:   1. Gastroesophageal reflux disease, unspecified whether esophagitis present Intensive lifestyle modifications are the first line treatment for this issue. We discussed several lifestyle modifications today.  She asked to be placed on cancellation list at [facility]- to have EGD completed sooner that the Oct 2022 scheduled timeframe. Anna Walker will continue to work on diet, exercise and weight loss efforts. Orders and follow up as documented in patient record.   Counseling If a person has  gastroesophageal reflux disease (GERD), food and stomach acid move back up into the esophagus and cause symptoms or problems such as damage to the esophagus. Anti-reflux measures include: raising the head of the bed, avoiding tight clothing or belts, avoiding eating late at night, not lying down shortly after mealtime, and achieving weight loss. Avoid ASA, NSAID's, caffeine, alcohol, and tobacco.  OTC Pepcid and/or Tums are often very helpful for as needed use.  However, for persisting chronic or daily symptoms, stronger medications like Omeprazole may be needed. You may need to avoid foods and drinks such as: Coffee and tea (with or without caffeine). Drinks that contain alcohol. Energy drinks and sports drinks. Bubbly (carbonated) drinks or sodas. Chocolate and cocoa. Peppermint and mint flavorings. Garlic and onions. Horseradish. Spicy and acidic foods. These include peppers, chili powder, curry powder, vinegar, hot sauces, and BBQ sauce. Citrus fruit juices and citrus fruits, such as oranges, lemons, and limes. Tomato-based foods. These include red sauce, chili, salsa, and pizza with red sauce. Fried and fatty foods. These include donuts, french fries, potato chips, and high-fat dressings. High-fat meats. These include hot dogs, rib eye steak, sausage, ham, and bacon.  2. Pre-diabetes Anna Walker will remain off of Trulicity until further notice.  She will continue to work on weight loss, exercise, and decreasing simple carbohydrates to help decrease the risk of diabetes.   3. Class 3 severe obesity with serious comorbidity and body mass index (BMI) of 40.0 to 44.9 in adult, unspecified obesity type Anna Walker) Anna Walker is currently in the action stage of change. As such, her goal is  to continue with weight loss efforts. She has agreed to the Category 3 Plan/Bland diet while exp GI upset.   Exercise goals: No exercise has been prescribed at this time.  Behavioral modification strategies:  increasing lean protein intake, decreasing simple carbohydrates, meal planning and cooking strategies, keeping healthy foods in the home, avoiding temptations, and planning for success.  Anna Walker has agreed to follow-up with our clinic in 2 weeks. She was informed of the importance of frequent follow-up visits to maximize her success with intensive lifestyle modifications for her multiple health conditions.   Objective:   Blood pressure 113/66, pulse 75, temperature 98 F (36.7 C), height '5\' 6"'$  (1.676 m), weight 259 lb (117.5 kg), SpO2 97 %. Body mass index is 41.8 kg/m.  General: Cooperative, alert, well developed, in no acute distress. HEENT: Conjunctivae and lids unremarkable. Cardiovascular: Regular rhythm.  Lungs: Normal work of breathing. Neurologic: No focal deficits.   Lab Results  Component Value Date   CREATININE 0.82 08/20/2020   BUN 15 08/20/2020   NA 139 08/20/2020   K 3.1 (L) 08/20/2020   CL 101 08/20/2020   CO2 28 08/20/2020   Lab Results  Component Value Date   ALT 14 08/20/2020   AST 11 (L) 08/20/2020   ALKPHOS 62 08/20/2020   BILITOT 0.5 08/20/2020   Lab Results  Component Value Date   HGBA1C 5.9 (H) 04/20/2020   HGBA1C 5.5 12/24/2019   HGBA1C 5.8 (H) 09/17/2019   HGBA1C 6.0 (H) 08/14/2018   HGBA1C 5.9 (H) 02/09/2015   Lab Results  Component Value Date   INSULIN 16.3 04/20/2020   INSULIN 19.6 12/24/2019   INSULIN 23.3 09/17/2019   INSULIN 22.3 05/01/2019   Lab Results  Component Value Date   TSH 1.507 02/09/2015   Lab Results  Component Value Date   CHOL 166 04/20/2020   HDL 57 04/20/2020   LDLCALC 88 04/20/2020   TRIG 117 04/20/2020   CHOLHDL 2.9 04/20/2020   Lab Results  Component Value Date   VD25OH 33.4 04/20/2020   VD25OH 31.9 12/24/2019   VD25OH 34.5 09/17/2019   Lab Results  Component Value Date   WBC 9.3 08/20/2020   HGB 12.8 08/20/2020   HCT 39.5 08/20/2020   MCV 83.7 08/20/2020   PLT 281 08/20/2020   No results  found for: IRON, TIBC, FERRITIN  Attestation Statements:   Reviewed by clinician on day of visit: allergies, medications, problem list, medical history, surgical history, family history, social history, and previous encounter notes.   Wilhemena Durie, am acting as transcriptionist for Mina Marble, NP.  I have reviewed the above documentation for accuracy and completeness, and I agree with the above. -Hopie Pellegrin d. Anna Cavazos, NP-C

## 2020-09-23 ENCOUNTER — Ambulatory Visit (INDEPENDENT_AMBULATORY_CARE_PROVIDER_SITE_OTHER): Payer: HMO | Admitting: Bariatrics

## 2020-09-23 ENCOUNTER — Other Ambulatory Visit: Payer: Self-pay

## 2020-09-23 ENCOUNTER — Encounter (INDEPENDENT_AMBULATORY_CARE_PROVIDER_SITE_OTHER): Payer: Self-pay | Admitting: Bariatrics

## 2020-09-23 VITALS — BP 111/71 | HR 73 | Temp 98.0°F | Ht 66.0 in | Wt 254.0 lb

## 2020-09-23 DIAGNOSIS — E038 Other specified hypothyroidism: Secondary | ICD-10-CM | POA: Diagnosis not present

## 2020-09-23 DIAGNOSIS — E876 Hypokalemia: Secondary | ICD-10-CM | POA: Diagnosis not present

## 2020-09-23 DIAGNOSIS — E559 Vitamin D deficiency, unspecified: Secondary | ICD-10-CM | POA: Diagnosis not present

## 2020-09-23 DIAGNOSIS — E7849 Other hyperlipidemia: Secondary | ICD-10-CM | POA: Diagnosis not present

## 2020-09-23 DIAGNOSIS — Z6841 Body Mass Index (BMI) 40.0 and over, adult: Secondary | ICD-10-CM

## 2020-09-23 DIAGNOSIS — R7303 Prediabetes: Secondary | ICD-10-CM

## 2020-09-23 NOTE — Progress Notes (Signed)
Chief Complaint:   OBESITY Anna Walker is here to discuss her progress with her obesity treatment plan along with follow-up of her obesity related diagnoses. Anna Walker is on the Category 3 Plan and states she is following her eating plan approximately 0% of the time. Anna Walker states she is doing yard work 0 minutes 0 times per week.  Today's visit was #: 23 Starting weight: 277 lbs Starting date: 05/01/2019 Today's weight: 254 lbs Today's date: 09/23/2020 Total lbs lost to date: 23 lbs Total lbs lost since last in-office visit: 5 lbs  Interim History: Anna Walker is down another 5 lbs since her last visit. She has been on the [employer] and will have endoscopic exam in October.  Subjective:   1. Pre-diabetes Anna Walker will decrease in carbohydrates.  2. Vitamin D deficiency Anna Walker is currently taking prescription Vitamin D.  3. Other hyperlipidemia Anna Walker is currently taking Zocor.  4. Other specified hypothyroidism Anna Walker is currently taking Synthroid.  5. Hypokalemia Anna Walker has GI issues.  Assessment/Plan:   1. Pre-diabetes Anna Walker will continue to work on weight loss, exercise, and decreasing simple carbohydrates to help decrease the risk of diabetes. We will check A1C and insulin today.  - Hemoglobin A1c - Insulin, random  2. Vitamin D deficiency Low Vitamin D level contributes to fatigue and are associated with obesity, breast, and colon cancer. Anna Walker agrees to continue to take prescription Vitamin D 50,000 IU every week and she will follow-up for routine testing of Vitamin D, at least 2-3 times per year to avoid over-replacement. We will check labs today.  - VITAMIN D 25 Hydroxy (Vit-D Deficiency, Fractures)  3. Other hyperlipidemia Cardiovascular risk and specific lipid/LDL goals reviewed.  We will check Lipid panel today. We discussed several lifestyle modifications today and Anna Walker will continue to work on diet, exercise and weight loss  efforts. Orders and follow up as documented in patient record.   Counseling Intensive lifestyle modifications are the first line treatment for this issue. Dietary changes: Increase soluble fiber. Decrease simple carbohydrates. Exercise changes: Moderate to vigorous-intensity aerobic activity 150 minutes per week if tolerated. Lipid-lowering medications: see documented in medical record.  - Lipid Panel With LDL/HDL Ratio - Comprehensive metabolic panel  4. Other specified hypothyroidism We will check thyroid panel today. Orders and follow up as documented in patient record.  Counseling Good thyroid control is important for overall health. Anna Walker thyroid levels are dangerous and will not improve weight loss results. Counseling: The correct way to take levothyroxine is fasting, with water, separated by at least 30 minutes from breakfast, and separated by more than 4 hours from calcium, iron, multivitamins, acid reflux medications (PPIs).   - TSH+T4F+T3Free  5. Hypokalemia We will check CMP panel today.   - Comprehensive metabolic panel  6. Obesity, current BMI 41.1 Anna Walker is currently in the action stage of change. As such, her goal is to continue with weight loss efforts. She has agreed to the Category 3 Plan.   Anna Walker will adhere closely to the plan. She will continue meal planning. She will increase protein.  Exercise goals:  As is.  Behavioral modification strategies: increasing lean protein intake, decreasing simple carbohydrates, increasing vegetables, increasing water intake, decreasing eating out, no skipping meals, meal planning and cooking strategies, keeping healthy foods in the home, and planning for success.  Anna Walker has agreed to follow-up with our clinic in 3-4 weeks. She was informed of the importance of frequent follow-up visits to maximize her success with intensive lifestyle modifications  for her multiple health conditions.   Anna Walker was informed  we would discuss her lab results at her next visit unless there is a critical issue that needs to be addressed sooner. Anna Walker agreed to keep her next visit at the agreed upon time to discuss these results.  Objective:   Blood pressure 111/71, pulse 73, temperature 98 F (36.7 C), height '5\' 6"'$  (1.676 m), weight 254 lb (115.2 kg), SpO2 99 %. Body mass index is 41 kg/m.  General: Cooperative, alert, well developed, in no acute distress. HEENT: Conjunctivae and lids unremarkable. Cardiovascular: Regular rhythm.  Lungs: Normal work of breathing. Neurologic: No focal deficits.   Lab Results  Component Value Date   CREATININE 0.82 08/20/2020   BUN 15 08/20/2020   NA 139 08/20/2020   K 3.1 (L) 08/20/2020   CL 101 08/20/2020   CO2 28 08/20/2020   Lab Results  Component Value Date   ALT 14 08/20/2020   AST 11 (L) 08/20/2020   ALKPHOS 62 08/20/2020   BILITOT 0.5 08/20/2020   Lab Results  Component Value Date   HGBA1C 5.9 (H) 04/20/2020   HGBA1C 5.5 12/24/2019   HGBA1C 5.8 (H) 09/17/2019   HGBA1C 6.0 (H) 08/14/2018   HGBA1C 5.9 (H) 02/09/2015   Lab Results  Component Value Date   INSULIN 16.3 04/20/2020   INSULIN 19.6 12/24/2019   INSULIN 23.3 09/17/2019   INSULIN 22.3 05/01/2019   Lab Results  Component Value Date   TSH 1.507 02/09/2015   Lab Results  Component Value Date   CHOL 166 04/20/2020   HDL 57 04/20/2020   LDLCALC 88 04/20/2020   TRIG 117 04/20/2020   CHOLHDL 2.9 04/20/2020   Lab Results  Component Value Date   VD25OH 33.4 04/20/2020   VD25OH 31.9 12/24/2019   VD25OH 34.5 09/17/2019   Lab Results  Component Value Date   WBC 9.3 08/20/2020   HGB 12.8 08/20/2020   HCT 39.5 08/20/2020   MCV 83.7 08/20/2020   PLT 281 08/20/2020   No results found for: IRON, TIBC, FERRITIN  Obesity Behavioral Intervention:   Approximately 15 minutes were spent on the discussion below.  ASK: We discussed the diagnosis of obesity with Anna Walker today and  Anna Walker agreed to give [place] permission to discuss obesity behavioral modification therapy today.  ASSESS: Anna Walker has the diagnosis of obesity and her BMI today is 41.1. Anna Walker is in the action stage of change.   ADVISE: Anna Walker was educated on the multiple health risks of obesity as well as the benefit of weight loss to improve her health. She was advised of the need for long term treatment and the importance of lifestyle modifications to improve her current health and to decrease her risk of future health problems.  AGREE: Multiple dietary modification options and treatment options were discussed and Anna Walker agreed to follow the recommendations documented in the above note.  ARRANGE: Anna Walker was educated on the importance of frequent visits to treat obesity as outlined per CMS and USPSTF guidelines and agreed to schedule her next follow up appointment today.  Attestation Statements:   Reviewed by clinician on day of visit: allergies, medications, problem list, medical history, surgical history, family history, social history, and previous encounter notes.   I, Lizbeth Bark, RMA, am acting as Location manager for [employer], DO.  I have reviewed the above documentation for accuracy and completeness, and I agree with the above. Jearld Lesch, DO

## 2020-09-24 LAB — COMPREHENSIVE METABOLIC PANEL
ALT: 21 IU/L (ref 0–32)
AST: 19 IU/L (ref 0–40)
Albumin/Globulin Ratio: 1.9 (ref 1.2–2.2)
Albumin: 4.2 g/dL (ref 3.8–4.8)
Alkaline Phosphatase: 75 IU/L (ref 44–121)
BUN/Creatinine Ratio: 11 — ABNORMAL LOW (ref 12–28)
BUN: 10 mg/dL (ref 8–27)
Bilirubin Total: 0.6 mg/dL (ref 0.0–1.2)
CO2: 25 mmol/L (ref 20–29)
Calcium: 9.2 mg/dL (ref 8.7–10.3)
Chloride: 102 mmol/L (ref 96–106)
Creatinine, Ser: 0.92 mg/dL (ref 0.57–1.00)
Globulin, Total: 2.2 g/dL (ref 1.5–4.5)
Glucose: 101 mg/dL — ABNORMAL HIGH (ref 65–99)
Potassium: 4.1 mmol/L (ref 3.5–5.2)
Sodium: 142 mmol/L (ref 134–144)
Total Protein: 6.4 g/dL (ref 6.0–8.5)
eGFR: 70 mL/min/{1.73_m2} (ref >59)

## 2020-09-24 LAB — VITAMIN D 25 HYDROXY (VIT D DEFICIENCY, FRACTURES): Vit D, 25-Hydroxy: 49.4 ng/mL (ref 30.0–100.0)

## 2020-09-24 LAB — LIPID PANEL WITH LDL/HDL RATIO
Cholesterol, Total: 149 mg/dL (ref 100–199)
HDL: 48 mg/dL (ref >39)
LDL Chol Calc (NIH): 78 mg/dL (ref 0–99)
LDL/HDL Ratio: 1.6 ratio (ref 0.0–3.2)
Triglycerides: 132 mg/dL (ref 0–149)
VLDL Cholesterol Cal: 23 mg/dL (ref 5–40)

## 2020-09-24 LAB — HEMOGLOBIN A1C
Est. average glucose Bld gHb Est-mCnc: 117 mg/dL
Hgb A1c MFr Bld: 5.7 % — ABNORMAL HIGH (ref 4.8–5.6)

## 2020-09-24 LAB — TSH+T4F+T3FREE
Free T4: 1.35 ng/dL (ref 0.82–1.77)
T3, Free: 2.7 pg/mL (ref 2.0–4.4)
TSH: 0.41 u[IU]/mL — ABNORMAL LOW (ref 0.450–4.500)

## 2020-09-24 LAB — INSULIN, RANDOM: INSULIN: 21.9 u[IU]/mL (ref 2.6–24.9)

## 2020-09-29 NOTE — Progress Notes (Signed)
Left msg for patient  

## 2020-10-07 ENCOUNTER — Telehealth (INDEPENDENT_AMBULATORY_CARE_PROVIDER_SITE_OTHER): Payer: HMO | Admitting: Adult Health

## 2020-10-07 ENCOUNTER — Other Ambulatory Visit: Payer: Self-pay

## 2020-10-07 DIAGNOSIS — E66813 Obesity, class 3: Secondary | ICD-10-CM

## 2020-10-07 DIAGNOSIS — R7303 Prediabetes: Secondary | ICD-10-CM

## 2020-10-07 DIAGNOSIS — E559 Vitamin D deficiency, unspecified: Secondary | ICD-10-CM | POA: Diagnosis not present

## 2020-10-07 DIAGNOSIS — R7989 Other specified abnormal findings of blood chemistry: Secondary | ICD-10-CM | POA: Diagnosis not present

## 2020-10-07 DIAGNOSIS — Z6841 Body Mass Index (BMI) 40.0 and over, adult: Secondary | ICD-10-CM

## 2020-10-07 MED ORDER — VITAMIN D (ERGOCALCIFEROL) 1.25 MG (50000 UNIT) PO CAPS: 50000.0000 [IU] | ORAL_CAPSULE | ORAL | 0 refills | Status: DC

## 2020-10-07 NOTE — Progress Notes (Signed)
TeleHealth Visit:  Due to the COVID-19 pandemic, this visit was completed with telemedicine (audio/video) technology to reduce patient and provider exposure as well as to preserve personal protective equipment.   Anna Walker has verbally consented to this TeleHealth visit. The patient is located at home, the provider is located at the [facility] office. The participants in this visit include the listed provider and patient. The visit was conducted today via MyChart video.  Chief Complaint: OBESITY Anna Walker is here to discuss her progress with her obesity treatment plan along with follow-up of her obesity related diagnoses. Anna Walker is on the Category 3 Plan and states she is following her eating plan approximately 30-40% of the time. Anna Walker states she is not exercising regularly.  Today's visit was #: 24 Starting weight: 277 lbs Starting date: 05/01/2019  Interim History: Anna Walker's husband fell and injured his left knee 2 days ago.  Her husband weighs 465 pounds.  Required EMS/firefighter support to transport to the ED.  Imaging negative for acute fracture.  He is home - in pain - unable to secure home PT or inpatient PT due to body habitus.   Subjective:   1. Vitamin D deficiency Vitamin D level - 49.4 - just below goal of 50. She is currently taking prescription ergocalciferol 50,000 IU each week. She denies nausea, vomiting or muscle weakness.  Lab Results  Component Value Date   VD25OH 49.4 09/23/2020   VD25OH 33.4 04/20/2020   VD25OH 31.9 12/24/2019   2. Pre-diabetes On 09/23/2020, BG 101, A1c 5.7, insulin level 21.9 - denies polyphagia. She denies 1st degree family history of T2D.  Lab Results  Component Value Date   HGBA1C 5.7 (H) 09/23/2020   Lab Results  Component Value Date   INSULIN 21.9 09/23/2020   INSULIN 16.3 04/20/2020   INSULIN 19.6 12/24/2019   INSULIN 23.3 09/17/2019   INSULIN 22.3 05/01/2019   3. Low thyroid stimulating hormone  (TSH) level She denies palpitations/sweating/increased anxiety. Her younger sister has Grave's disease. She is not followed by Endocrinology.  Lab Results  Component Value Date   TSH 0.410 (L) 09/23/2020   Assessment/Plan:   1. Vitamin D deficiency Check labs every 2-3 months. Refill ergocalciferol 50,000 IU once weekly, as per below.  - Refill Vitamin D, Ergocalciferol, (DRISDOL) 1.25 MG (50000 UNIT) CAPS capsule; Take 1 capsule (50,000 Units total) by mouth every 7 (seven) days.  Dispense: 12 capsule; Refill: 0  2. Pre-diabetes Increase protein, increase NEAT activities.  3. Low thyroid stimulating hormone (TSH) level Monitor for symptoms. Check labs every 3 months.  4. Class 3 severe obesity with serious comorbidity and body mass index (BMI) of 40.0 to 44.9 in adult, unspecified obesity type Anna Walker)  Anna Walker is currently in the action stage of change. As such, her goal is to continue with weight loss efforts. She has agreed to the Category 3 Plan.   Exercise goals:  As is.  Behavioral modification strategies: increasing lean protein intake, decreasing simple carbohydrates, meal planning and cooking strategies, keeping healthy foods in the home, and planning for success.  Continue with [facility]/PCP - for  therapy needs for husband.  Anna Walker has agreed to follow-up with our clinic in 3 weeks with Dr. Owens Shark. She was informed of the importance of frequent follow-up visits to maximize her success with intensive lifestyle modifications for her multiple health conditions.  Objective:   VITALS: Per patient if applicable, see vitals. GENERAL: Alert and in no acute distress. CARDIOPULMONARY: No increased  WOB. Speaking in clear sentences.  PSYCH: Pleasant and cooperative. Speech normal rate and rhythm. Affect is appropriate. Insight and judgement are appropriate. Attention is focused, linear, and appropriate.  NEURO: Oriented as arrived to appointment on time with no  prompting.   Lab Results  Component Value Date   CREATININE 0.92 09/23/2020   BUN 10 09/23/2020   NA 142 09/23/2020   K 4.1 09/23/2020   CL 102 09/23/2020   CO2 25 09/23/2020   Lab Results  Component Value Date   ALT 21 09/23/2020   AST 19 09/23/2020   ALKPHOS 75 09/23/2020   BILITOT 0.6 09/23/2020   Lab Results  Component Value Date   HGBA1C 5.7 (H) 09/23/2020   HGBA1C 5.9 (H) 04/20/2020   HGBA1C 5.5 12/24/2019   HGBA1C 5.8 (H) 09/17/2019   HGBA1C 6.0 (H) 08/14/2018   Lab Results  Component Value Date   INSULIN 21.9 09/23/2020   INSULIN 16.3 04/20/2020   INSULIN 19.6 12/24/2019   INSULIN 23.3 09/17/2019   INSULIN 22.3 05/01/2019   Lab Results  Component Value Date   TSH 0.410 (L) 09/23/2020   Lab Results  Component Value Date   CHOL 149 09/23/2020   HDL 48 09/23/2020   LDLCALC 78 09/23/2020   TRIG 132 09/23/2020   CHOLHDL 2.9 04/20/2020   Lab Results  Component Value Date   VD25OH 49.4 09/23/2020   VD25OH 33.4 04/20/2020   VD25OH 31.9 12/24/2019   Lab Results  Component Value Date   WBC 9.3 08/20/2020   HGB 12.8 08/20/2020   HCT 39.5 08/20/2020   MCV 83.7 08/20/2020   PLT 281 08/20/2020   Attestation Statements:   Reviewed by clinician on day of visit: allergies, medications, problem list, medical history, surgical history, family history, social history, and previous encounter notes.  Video visit.  Total time spent on visit including pre-charting with patient, placing orders, etc., was >30 minutes.   I, Water quality scientist, CMA, am acting as Location manager for Mina Marble, NP.  I have reviewed the above documentation for accuracy and completeness, and I agree with the above. - Larie Mathes d. Bardia Wangerin, NP-C

## 2020-10-26 ENCOUNTER — Encounter (INDEPENDENT_AMBULATORY_CARE_PROVIDER_SITE_OTHER): Payer: Self-pay

## 2020-10-26 NOTE — Telephone Encounter (Signed)
FYI

## 2020-10-28 ENCOUNTER — Ambulatory Visit (INDEPENDENT_AMBULATORY_CARE_PROVIDER_SITE_OTHER): Payer: HMO | Admitting: Bariatrics

## 2021-01-20 ENCOUNTER — Other Ambulatory Visit (INDEPENDENT_AMBULATORY_CARE_PROVIDER_SITE_OTHER): Payer: Self-pay | Admitting: Adult Health

## 2021-01-20 DIAGNOSIS — E559 Vitamin D deficiency, unspecified: Secondary | ICD-10-CM

## 2021-01-21 ENCOUNTER — Other Ambulatory Visit: Payer: Self-pay | Admitting: Neurology

## 2021-01-27 DIAGNOSIS — G43909 Migraine, unspecified, not intractable, without status migrainosus: Secondary | ICD-10-CM | POA: Diagnosis not present

## 2021-01-27 DIAGNOSIS — E039 Hypothyroidism, unspecified: Secondary | ICD-10-CM | POA: Diagnosis not present

## 2021-01-27 DIAGNOSIS — R32 Unspecified urinary incontinence: Secondary | ICD-10-CM | POA: Diagnosis not present

## 2021-01-27 DIAGNOSIS — E1169 Type 2 diabetes mellitus with other specified complication: Secondary | ICD-10-CM | POA: Diagnosis not present

## 2021-01-27 DIAGNOSIS — K219 Gastro-esophageal reflux disease without esophagitis: Secondary | ICD-10-CM | POA: Diagnosis not present

## 2021-01-27 DIAGNOSIS — G4733 Obstructive sleep apnea (adult) (pediatric): Secondary | ICD-10-CM | POA: Diagnosis not present

## 2021-01-27 DIAGNOSIS — Z0000 Encounter for general adult medical examination without abnormal findings: Secondary | ICD-10-CM | POA: Diagnosis not present

## 2021-01-27 DIAGNOSIS — I10 Essential (primary) hypertension: Secondary | ICD-10-CM | POA: Diagnosis not present

## 2021-01-27 DIAGNOSIS — E559 Vitamin D deficiency, unspecified: Secondary | ICD-10-CM | POA: Diagnosis not present

## 2021-01-27 DIAGNOSIS — F39 Unspecified mood [affective] disorder: Secondary | ICD-10-CM | POA: Diagnosis not present

## 2021-01-27 DIAGNOSIS — G35 Multiple sclerosis: Secondary | ICD-10-CM | POA: Diagnosis not present

## 2021-01-27 DIAGNOSIS — E785 Hyperlipidemia, unspecified: Secondary | ICD-10-CM | POA: Diagnosis not present

## 2021-01-28 DIAGNOSIS — H00022 Hordeolum internum right lower eyelid: Secondary | ICD-10-CM | POA: Diagnosis not present

## 2021-02-03 DIAGNOSIS — G4733 Obstructive sleep apnea (adult) (pediatric): Secondary | ICD-10-CM | POA: Diagnosis not present

## 2021-02-11 DIAGNOSIS — H52203 Unspecified astigmatism, bilateral: Secondary | ICD-10-CM | POA: Diagnosis not present

## 2021-02-11 DIAGNOSIS — H25043 Posterior subcapsular polar age-related cataract, bilateral: Secondary | ICD-10-CM | POA: Diagnosis not present

## 2021-02-11 DIAGNOSIS — D23112 Other benign neoplasm of skin of right lower eyelid, including canthus: Secondary | ICD-10-CM | POA: Diagnosis not present

## 2021-02-11 DIAGNOSIS — H5213 Myopia, bilateral: Secondary | ICD-10-CM | POA: Diagnosis not present

## 2021-02-11 DIAGNOSIS — H2513 Age-related nuclear cataract, bilateral: Secondary | ICD-10-CM | POA: Diagnosis not present

## 2021-02-11 DIAGNOSIS — E119 Type 2 diabetes mellitus without complications: Secondary | ICD-10-CM | POA: Diagnosis not present

## 2021-02-11 DIAGNOSIS — H524 Presbyopia: Secondary | ICD-10-CM | POA: Diagnosis not present

## 2021-02-11 DIAGNOSIS — H47291 Other optic atrophy, right eye: Secondary | ICD-10-CM | POA: Diagnosis not present

## 2021-02-26 DIAGNOSIS — K760 Fatty (change of) liver, not elsewhere classified: Secondary | ICD-10-CM | POA: Diagnosis not present

## 2021-02-26 DIAGNOSIS — F339 Major depressive disorder, recurrent, unspecified: Secondary | ICD-10-CM | POA: Diagnosis not present

## 2021-02-26 DIAGNOSIS — G4733 Obstructive sleep apnea (adult) (pediatric): Secondary | ICD-10-CM | POA: Diagnosis not present

## 2021-02-26 DIAGNOSIS — E1169 Type 2 diabetes mellitus with other specified complication: Secondary | ICD-10-CM | POA: Diagnosis not present

## 2021-02-26 DIAGNOSIS — J029 Acute pharyngitis, unspecified: Secondary | ICD-10-CM | POA: Diagnosis not present

## 2021-02-26 DIAGNOSIS — R059 Cough, unspecified: Secondary | ICD-10-CM | POA: Diagnosis not present

## 2021-02-26 DIAGNOSIS — G35 Multiple sclerosis: Secondary | ICD-10-CM | POA: Diagnosis not present

## 2021-03-16 DIAGNOSIS — M545 Low back pain, unspecified: Secondary | ICD-10-CM | POA: Diagnosis not present

## 2021-03-16 DIAGNOSIS — R1031 Right lower quadrant pain: Secondary | ICD-10-CM | POA: Diagnosis not present

## 2021-03-22 ENCOUNTER — Other Ambulatory Visit: Payer: Self-pay | Admitting: Physician Assistant

## 2021-03-22 ENCOUNTER — Ambulatory Visit
Admission: RE | Admit: 2021-03-22 | Discharge: 2021-03-22 | Disposition: A | Discharge status: Home / Self Care | Payer: HMO | Source: Ambulatory Visit | Attending: Physician Assistant | Admitting: Physician Assistant

## 2021-03-22 DIAGNOSIS — R109 Unspecified abdominal pain: Secondary | ICD-10-CM | POA: Diagnosis not present

## 2021-03-22 DIAGNOSIS — R1031 Right lower quadrant pain: Secondary | ICD-10-CM | POA: Diagnosis not present

## 2021-03-22 DIAGNOSIS — M545 Low back pain, unspecified: Secondary | ICD-10-CM | POA: Diagnosis not present

## 2021-03-25 NOTE — Progress Notes (Unsigned)
NEUROLOGY FOLLOW UP OFFICE NOTE  NAYVIE LIPS [id number]  Assessment/Plan:   1.  Migraine with aura, without status migrainosus, not intractable 2.  Recurrent symptoms of right-sided heaviness/numbness, likely complicated migraine.  He has history of of right optic neuritis, but based on testing that has been performed, I do not believe Ms. Sarnowski has multiple sclerosis.  CSF analysis revealed 1 oligoclonal band, which is not a significant number.  She has had repeated MRIs over the years with minimal (3 ) tiny hyperintense foci in the periventricular white matter which have been stable.  Given her longstanding history with recurrent flare-ups, I would expect radiographic evidence of disease progression on MRI.  She also does not exhibit abnormal findings in other regions of the CNS, such as supratentorial region or spinal cord.   3.  Restless leg syndrome   1.  Migraine prevention:  Gabapentin 600mg  at bedtime 2.  Migraine rescue:  Sumatriptan 3.  RLS management:  Ropinirole 3mg  at dinner if needed and 3mg  at bedtime; gabapentin 600mg  at bedtime (300mg  in evening if needed) 4.  Limit use of pain relievers to no more than 2 days out of week to prevent risk of rebound or medication-overuse headache. 5.  Keep headache diary 6.  Follow up 9 months   Subjective:  Raynie Steinhaus is a 66 year old woman with ADD, fatigue, hypertension, restless leg syndrome, migraines, hyperlipidemia, type 2 diabetes mellitus, OSA, hypothyroidism and history of right optic neuritis who follows up for migraines and RLS.Marland Kitchen   UPDATE: Migraines are controlled.  They are mild, last a few hours.  Occurred 2 in past month.  Takes Tylenol and cold compress    She still has episodes of dizziness/spinning and feels shaky.  They last a few minutes and occur multiple times a week.  Occurs when looking up but can also occur while sitting in the recliner watching TV   Restless leg is controlled.  Takes gabapentin 600mg   at bedtime and ropinirole 3mg  at dinner and at bedtime.  If needed, she takes 300mg  gabapentin earlier in the evening.   Current NSAIDS:  ASA 81mg  daily Current analgesics:  no Current triptans:  sumatriptan 100mg  Current anti-emetic:  no Current muscle relaxants:  no Current anti-anxiolytic:  no Current sleep aide:  no Current Antihypertensive medications:  HCTZ Current Antidepressant medications:  fluoxetine 30mg  Current Anticonvulsant medications:  gabapentin 300mg  evening PRN and 600mg  at bedtime Other medications:  Ropinirole 3mg  dinner and 3mg  bedtime, amantadine 100mg  three times daily (fatigue)    HISTORY: I RIGHT OPTIC NEURITIS AND OTHER RECURRENT SYMPTOMS WITH QUESTIONABLE MULTIPLE SCLEROSIS: She had an episode of right optic neuritis in 1998, presenting first as blurred vision and then complete vision loss.   A couple of weeks later, she developed right sided numbness of the face, arm and leg.  She underwent a lumbar puncture which demonstrated one oligoclonal band.  MRI of brain reportedly may have shown foci which may be concerning for MS.  MRI cervical spine reportedly did not demonstrate demyelinating lesions.  She was diagnosed with multiple sclerosis and treated with IV steroids.  The vision loss lasted 7-8 weeks.  The numbness lasted several days.  She was treated with DMT for several years.  She started on Betaseron but was then switched to Copaxone and then Avonex due to intolerability.  She had a repeat LP in 2002 which showed no oligoclonal bands and normal IgG index of 0.6.  Visual evoked potential in 2013 showed absent right response  and normal left response.  Repeat MRI from July 2014 was thought to be essentially normal so Avonex was discontinued.  Repeat MRI in November 2014 was unchanged.  Therefore, it was believed that she did not have MS.  NMO-IgG antibodies from 02/24/14 were negative.  MRI of cervical spine without contrast from 03/24/16 was personally reviewed and  revealed cervical spondylosis but no abnormal cord signal.   Since the episode in 1998, she endorses subtle weakness on the right side of her body.  For example, when she starts to write, her handwriting will start to get worse.  She also reports short-term memory deficits since 1998.  Over the years, she has had recurrent episodes of right eye pain with right sided numbness.  In the past, they were thought to be MS flares which were treated with steroids.  They occur when she is heated or fatigued.  They typically occur at least once a year and they last about 2 days.  She last had an episode in early January 2019, for which she was evaluated in the ED on 01/30/17.  She had an MRI of the brain and orbits with and without contrast, which was personally reviewed and demonstrated asymmetrically small right optic nerve compatible with sequelae of prior optic neuritis but no enhancement, as well as three nonspecific tiny hyperintense T2/FLAIR foci in the bifrontal periventricular white matter.  All of her symptoms have not progressed over the years and recurrence of these episodes have not increased since discontinuation of Avonex.   She was seen in the ED on 08/27/18 with 1 day of bilateral blurred vision, of gradual onset. It was monocular in either eye.  She first noticed it because she had trouble driving.  She had some right eye ache and dizziness but no headache or new numbness and tingling   MRI of brain/orbits and cervical spine with and without contrast appeared stable and showed no active demyelinating lesions or abnormal signal of optic nerves.  She was diagnosed by in-house neurology with migraine and was treated with migraine cocktail of toradol and Reglan.  She doesn't have vertigo but she feels "insecure" and problems with balance when ambulating.  Blurred vision was unchanged.  She followed up with her ophthalmologist, Dr. Gershon Crane.  Eye exam was unremarkable except for early cataracts.    She has pain  in the legs and gait issues.  She does have arthritis in the knees, which required surgery and had a subsequent DVT.  She is treated for restless leg syndrome, for which she takes ropinirole and gabapentin.  She has chronic burning in the hands.   She also reports chronic fatigue.  She has OSA and is on CPAP.  She takes amantadine.   II MIGRAINES She has history of migraines since early adulthood.  They are severe bifrontal pain associated with nausea, photophobia and phonophobia but no vomiting, new visual disturbance or unilateral numbness or weakness.  They last 1 to 2 days and usually occur once a year.  There are no specific triggers or relieving factors however eye gets more blurred when fatigued or with drop in blood sugar.    She also reports another migraine described as severe sharp right retro-orbital pain in September 2019.  One one occasion, pain radiated into right ear and posterior neck.  Constant but fluctuates.  There is associated blurred vision in right eye, dizziness, nausea, photophobia and phonophobia, eye twitching and fatigue.  No associated right sided numbness and weakness.  PAST MEDICAL  HISTORY: Past Medical History:  Diagnosis Date   ADD (attention deficit disorder)    Anxiety    Biliary colic    Bladder leak    Diabetes mellitus without complication ([place])    type 2 , diet controlled    Diverticular disease    DVT (deep venous thrombosis) ([facility])    left leg , resolved after treatment of blood thinners    Edema of both lower extremities    GERD (gastroesophageal reflux disease)    causes coughing    Glaucoma    High cholesterol    Hypertension    Hypothyroid    Leg cramps    "in my thighs"    Leg cramps    Migraines    Multiple sclerosis ([place])    per patient " that ha sbeen ruled out , they though it was that initially but it did not preogress like it"   Optic neuritis    lesion on right optic nerve; right eye only , reduced vision and blurriness , loss of  depth perception, glasses do not help when it flares     OSA (obstructive sleep apnea)    nightly cpap    Paresthesias/numbness    fingers and toes    Pneumonia 2017   Restless leg syndrome    Sleep apnea    SOB (shortness of breath)    Thyroid disease    Hypothyroidism    MEDICATIONS: Current Outpatient Medications on File Prior to Visit  Medication Sig Dispense Refill   acetaminophen (TYLENOL) 500 MG tablet Take 500 mg by mouth every 6 (six) hours as needed.     amantadine (SYMMETREL) 100 MG capsule TAKE 1 CAPSULE(100 MG) BY MOUTH THREE TIMES DAILY 270 capsule 0   ASPERCREME LIDOCAINE EX Apply topically.     aspirin 81 MG EC tablet Take 81 mg by mouth daily. Swallow whole.     famotidine (PEPCID) 20 MG tablet Take 20 mg by mouth 2 (two) times daily.     famotidine (PEPCID) 40 MG tablet Take 40 mg by mouth daily.     FLUoxetine (PROZAC) 10 MG capsule Take 10 mg by mouth at bedtime. Take with 20 mg dose     FLUoxetine (PROZAC) 20 MG capsule Take 20 mg by mouth at bedtime. Take with 10 mg dose     gabapentin (NEURONTIN) 300 MG capsule TAKE 1 CAPSULE BY MOUTH AT DINNER AND 2 CAPSULES AT BEDTIME 270 capsule 4   hydrochlorothiazide (HYDRODIURIL) 12.5 MG tablet Take 12.5 mg by mouth daily.      levothyroxine (SYNTHROID) 137 MCG tablet Take 137 mcg by mouth every morning.     levothyroxine (SYNTHROID, LEVOTHROID) 150 MCG tablet Take 150 mcg by mouth daily before breakfast.     oxybutynin (DITROPAN) 5 MG tablet Take 5 mg by mouth at bedtime.      pantoprazole (PROTONIX) 40 MG tablet Take 40 mg by mouth 2 (two) times daily.     rOPINIRole (REQUIP) 3 MG tablet Take 3mg  at dinner if needed and 3mg  at bedtime 180 tablet 5   simvastatin (ZOCOR) 10 MG tablet Take 10 mg by mouth every evening.      SUMAtriptan (IMITREX) 100 MG tablet Take 1 tablet (100 mg total) by mouth every 2 (two) hours as needed for migraine. May repeat in 2 hours if headache persists or recurs. 10 tablet 5   terbinafine  (LAMISIL) 250 MG tablet Take 1 tablet (250 mg total) by mouth daily. 90 tablet 0  Vitamin D, Ergocalciferol, (DRISDOL) 1.25 MG (50000 UNIT) CAPS capsule Take 1 capsule (50,000 Units total) by mouth every 7 (seven) days. 12 capsule 0   No current facility-administered medications on file prior to visit.    ALLERGIES: Allergies  Allergen Reactions   Adderall [Amphetamine-Dextroamphetamine] Other (See Comments)    Caused elevated BP   Topamax [Topiramate]     dizzy    FAMILY HISTORY: Family History  Problem Relation Age of Onset   Barrett's esophagus Mother    Hypertension Mother    Obesity Mother    Berenice Primas' disease Sister    Rheum arthritis Sister    Heart attack Father 6   Sudden death Father    Coronary artery disease Neg Hx       Objective:  *** General: No acute distress.  Patient appears ***-groomed.   Head:  Normocephalic/atraumatic Eyes:  Fundi examined but not visualized Neck: supple, no paraspinal tenderness, full range of motion Heart:  Regular rate and rhythm Lungs:  Clear to auscultation bilaterally Back: No paraspinal tenderness Neurological Exam: alert and oriented to person, place, and time.  Speech fluent and not dysarthric, language intact.  CN II-XII intact. Bulk and tone normal, muscle strength 5/5 throughout.  Sensation to light touch intact.  Deep tendon reflexes 2+ throughout, toes downgoing.  Finger to nose testing intact.  Gait normal, Romberg negative.   Metta Clines, DO  CC: ***

## 2021-03-26 ENCOUNTER — Ambulatory Visit: Payer: HMO | Admitting: Neurology

## 2021-03-31 DIAGNOSIS — S39012A Strain of muscle, fascia and tendon of lower back, initial encounter: Secondary | ICD-10-CM | POA: Diagnosis not present

## 2021-04-06 DIAGNOSIS — M5136 Other intervertebral disc degeneration, lumbar region: Secondary | ICD-10-CM | POA: Diagnosis not present

## 2021-04-06 DIAGNOSIS — M5416 Radiculopathy, lumbar region: Secondary | ICD-10-CM | POA: Diagnosis not present

## 2021-04-08 NOTE — Progress Notes (Deleted)
? ?NEUROLOGY FOLLOW UP OFFICE NOTE ? ?Anna Walker ?[id number] ? ?Assessment/Plan:  ? ?1.  Migraine with aura, without status migrainosus, not intractable ?2.  Recurrent symptoms of right-sided heaviness/numbness, likely complicated migraine.  He has history of of right optic neuritis, but based on testing that has been performed, I do not believe Anna Walker has multiple sclerosis.  CSF analysis revealed 1 oligoclonal band, which is not a significant number.  She has had repeated MRIs over the years with minimal (3 ) tiny hyperintense foci in the periventricular white matter which have been stable.  Given her longstanding history with recurrent flare-ups, I would expect radiographic evidence of disease progression on MRI.  She also does not exhibit abnormal findings in other regions of the CNS, such as supratentorial region or spinal cord.   ?3.  Restless leg syndrome ?  ?1.  Migraine prevention:  Gabapentin '600mg'$  at bedtime ?2.  Migraine rescue:  Sumatriptan.  ?3.  RLS management:  Ropinirole '3mg'$  at dinner if needed and '3mg'$  at bedtime; gabapentin '600mg'$  at bedtime ('300mg'$  in evening if needed) ?4.  Limit use of pain relievers to no more than 2 days out of week to prevent risk of rebound or medication-overuse headache. ?5.  Keep headache diary ?6.  Follow up 9 months ?  ?Subjective:  ?Anna Walker is a 66 year old woman with ADD, fatigue, hypertension, restless leg syndrome, migraines, hyperlipidemia, type 2 diabetes mellitus, OSA, hypothyroidism and history of right optic neuritis who follows up for migraines and RLS.Marland Kitchen ?  ?UPDATE: ?Migraines are controlled.  They are mild, last a few hours.  Occurred 2 in past month.  Takes Tylenol and cold compress   Ubrelvy???? ?  ?She still has episodes of dizziness/spinning and feels shaky.  They last a few minutes and occur multiple times a week.  Occurs when looking up but can also occur while sitting in the recliner watching TV ?  ?Restless leg is controlled.  Takes  gabapentin '600mg'$  at bedtime and ropinirole '3mg'$  at dinner and at bedtime.  If needed, she takes '300mg'$  gabapentin earlier in the evening. ?  ?Current NSAIDS:  ASA '81mg'$  daily ?Current analgesics:  no ?Current triptans:  sumatriptan '100mg'$  ?Current anti-emetic:  no ?Current muscle relaxants:  no ?Current anti-anxiolytic:  no ?Current sleep aide:  no ?Current Antihypertensive medications:  HCTZ ?Current Antidepressant medications:  fluoxetine '30mg'$  ?Current Anticonvulsant medications:  gabapentin '300mg'$  evening PRN and '600mg'$  at bedtime ?Other medications:  Ropinirole '3mg'$  dinner and '3mg'$  bedtime, amantadine '100mg'$  three times daily (fatigue)  ?  ?HISTORY: ?I RIGHT OPTIC NEURITIS AND OTHER RECURRENT SYMPTOMS WITH QUESTIONABLE MULTIPLE SCLEROSIS: ?She had an episode of right optic neuritis in 1998, presenting first as blurred vision and then complete vision loss.   A couple of weeks later, she developed right sided numbness of the face, arm and leg.  She underwent a lumbar puncture which demonstrated one oligoclonal band.  MRI of brain reportedly may have shown foci which may be concerning for MS.  MRI cervical spine reportedly did not demonstrate demyelinating lesions.  She was diagnosed with multiple sclerosis and treated with IV steroids.  The vision loss lasted 7-8 weeks.  The numbness lasted several days.  She was treated with DMT for several years.  She started on Betaseron but was then switched to Copaxone and then Avonex due to intolerability.  She had a repeat LP in 2002 which showed no oligoclonal bands and normal IgG index of 0.6.  Visual evoked potential in 2013 showed  absent right response and normal left response.  Repeat MRI from July 2014 was thought to be essentially normal so Avonex was discontinued.  Repeat MRI in November 2014 was unchanged.  Therefore, it was believed that she did not have MS.  NMO-IgG antibodies from 02/24/14 were negative.  MRI of cervical spine without contrast from 03/24/16 was personally  reviewed and revealed cervical spondylosis but no abnormal cord signal. ?  ?Since the episode in 1998, she endorses subtle weakness on the right side of her body.  For example, when she starts to write, her handwriting will start to get worse.  She also reports short-term memory deficits since 1998.  Over the years, she has had recurrent episodes of right eye pain with right sided numbness.  In the past, they were thought to be MS flares which were treated with steroids.  They occur when she is heated or fatigued.  They typically occur at least once a year and they last about 2 days.  She last had an episode in early January 2019, for which she was evaluated in the ED on 01/30/17.  She had an MRI of the brain and orbits with and without contrast, which was personally reviewed and demonstrated asymmetrically small right optic nerve compatible with sequelae of prior optic neuritis but no enhancement, as well as three nonspecific tiny hyperintense T2/FLAIR foci in the bifrontal periventricular white matter.  All of her symptoms have not progressed over the years and recurrence of these episodes have not increased since discontinuation of Avonex. ?  ?She was seen in the ED on 08/27/18 with 1 day of bilateral blurred vision, of gradual onset. It was monocular in either eye.  She first noticed it because she had trouble driving.  She had some right eye ache and dizziness but no headache or new numbness and tingling   MRI of brain/orbits and cervical spine with and without contrast appeared stable and showed no active demyelinating lesions or abnormal signal of optic nerves.  She was diagnosed by in-house neurology with migraine and was treated with migraine cocktail of toradol and Reglan.  She doesn't have vertigo but she feels "insecure" and problems with balance when ambulating.  Blurred vision was unchanged.  She followed up with her ophthalmologist, Dr. Gershon Crane.  Eye exam was unremarkable except for early cataracts.  ?   ?She has pain in the legs and gait issues.  She does have arthritis in the knees, which required surgery and had a subsequent DVT.  She is treated for restless leg syndrome, for which she takes ropinirole and gabapentin.  She has chronic burning in the hands. ?  ?She also reports chronic fatigue.  She has OSA and is on CPAP.  She takes amantadine. ?  ?II MIGRAINES ?She has history of migraines since early adulthood.  They are severe bifrontal pain associated with nausea, photophobia and phonophobia but no vomiting, new visual disturbance or unilateral numbness or weakness.  They last 1 to 2 days and usually occur once a year.  There are no specific triggers or relieving factors however eye gets more blurred when fatigued or with drop in blood sugar. ?  ? She also reports another migraine described as severe sharp right retro-orbital pain in September 2019.  One one occasion, pain radiated into right ear and posterior neck.  Constant but fluctuates.  There is associated blurred vision in right eye, dizziness, nausea, photophobia and phonophobia, eye twitching and fatigue.  No associated right sided numbness and weakness. ?  ?  ?  Past medication:  topiramate ? ?PAST MEDICAL HISTORY: ?Past Medical History:  ?Diagnosis Date  ? ADD (attention deficit disorder)   ? Anxiety   ? Biliary colic   ? Bladder leak   ? Diabetes mellitus without complication ([place])   ? type 2 , diet controlled   ? Diverticular disease   ? DVT (deep venous thrombosis) ([place])   ? left leg , resolved after treatment of blood thinners   ? Edema of both lower extremities   ? GERD (gastroesophageal reflux disease)   ? causes coughing   ? Glaucoma   ? High cholesterol   ? Hypertension   ? Hypothyroid   ? Leg cramps   ? "in my thighs"   ? Leg cramps   ? Migraines   ? Multiple sclerosis ([place])   ? per patient " that ha sbeen ruled out , they though it was that initially but it did not preogress like it"  ? Optic neuritis   ? lesion on right optic nerve; right  eye only , reduced vision and blurriness , loss of depth perception, glasses do not help when it flares    ? OSA (obstructive sleep apnea)   ? nightly cpap   ? Paresthesias/numbness   ? fingers and toes   ?

## 2021-04-09 ENCOUNTER — Ambulatory Visit: Payer: HMO | Admitting: Neurology

## 2021-04-12 DIAGNOSIS — M545 Low back pain, unspecified: Secondary | ICD-10-CM | POA: Diagnosis not present

## 2021-04-13 DIAGNOSIS — M5136 Other intervertebral disc degeneration, lumbar region: Secondary | ICD-10-CM | POA: Diagnosis not present

## 2021-04-13 DIAGNOSIS — M5416 Radiculopathy, lumbar region: Secondary | ICD-10-CM | POA: Diagnosis not present

## 2021-04-16 DIAGNOSIS — M5416 Radiculopathy, lumbar region: Secondary | ICD-10-CM | POA: Diagnosis not present

## 2021-04-16 DIAGNOSIS — M5136 Other intervertebral disc degeneration, lumbar region: Secondary | ICD-10-CM | POA: Diagnosis not present

## 2021-04-19 DIAGNOSIS — M545 Low back pain, unspecified: Secondary | ICD-10-CM | POA: Diagnosis not present

## 2021-04-22 DIAGNOSIS — M5416 Radiculopathy, lumbar region: Secondary | ICD-10-CM | POA: Diagnosis not present

## 2021-04-22 DIAGNOSIS — M5136 Other intervertebral disc degeneration, lumbar region: Secondary | ICD-10-CM | POA: Diagnosis not present

## 2021-04-27 DIAGNOSIS — M48062 Spinal stenosis, lumbar region with neurogenic claudication: Secondary | ICD-10-CM | POA: Diagnosis not present

## 2021-04-29 DIAGNOSIS — M5136 Other intervertebral disc degeneration, lumbar region: Secondary | ICD-10-CM | POA: Diagnosis not present

## 2021-04-29 DIAGNOSIS — M5416 Radiculopathy, lumbar region: Secondary | ICD-10-CM | POA: Diagnosis not present

## 2021-05-03 DIAGNOSIS — M545 Low back pain, unspecified: Secondary | ICD-10-CM | POA: Diagnosis not present

## 2021-05-03 DIAGNOSIS — Z6841 Body Mass Index (BMI) 40.0 and over, adult: Secondary | ICD-10-CM | POA: Diagnosis not present

## 2021-05-04 DIAGNOSIS — M5136 Other intervertebral disc degeneration, lumbar region: Secondary | ICD-10-CM | POA: Diagnosis not present

## 2021-05-04 DIAGNOSIS — M5416 Radiculopathy, lumbar region: Secondary | ICD-10-CM | POA: Diagnosis not present

## 2021-05-17 DIAGNOSIS — M5136 Other intervertebral disc degeneration, lumbar region: Secondary | ICD-10-CM | POA: Diagnosis not present

## 2021-05-17 DIAGNOSIS — M5416 Radiculopathy, lumbar region: Secondary | ICD-10-CM | POA: Diagnosis not present

## 2021-05-18 NOTE — Progress Notes (Signed)
? ?NEUROLOGY FOLLOW UP OFFICE NOTE ? ?Anna Walker ?[id number] ? ?Assessment/Plan:  ? ?1.  Migraine with aura, without status migrainosus, not intractable ?2.  Recurrent symptoms of right-sided heaviness/numbness, likely complicated migraine.  He has history of of right optic neuritis, but based on testing that has been performed, I do not believe Ms. Eimer has multiple sclerosis.  CSF analysis revealed 1 oligoclonal band, which is not a significant number.  She has had repeated MRIs over the years with minimal (3 ) tiny hyperintense foci in the periventricular white matter which have been stable.  Given her longstanding history with recurrent flare-ups, I would expect radiographic evidence of disease progression on MRI.  She also does not exhibit abnormal findings in other regions of the CNS, such as supratentorial region or spinal cord.   ?3.  Restless leg syndrome ?  ?1.  Migraine prevention:  Gabapentin '600mg'$  at bedtime ?2.  RLS management:  Ropinirole '3mg'$  at dinner if needed and '3mg'$  at bedtime; gabapentin '600mg'$  at bedtime ('300mg'$  in evening if needed) ?4.  Limit use of pain relievers to no more than 2 days out of week to prevent risk of rebound or medication-overuse headache. ?5.  Keep headache diary ?6.  Follow up 6 months ?  ?Subjective:  ?Anna Walker is a 66 year old woman with ADD, fatigue, hypertension, restless leg syndrome, migraines, hyperlipidemia, type 2 diabetes mellitus, OSA, hypothyroidism and history of right optic neuritis who follows up for migraines and RLS.Marland Kitchen ?  ?UPDATE: ?Migraines are controlled.  She rarely has one.  Anna Walker was effective but no covered by her insurance.   ? ?Mostly upper and lower back pain.  Seen by orthopedics.   ?  ?She still has episodes of dizziness/spinning and feels shaky.  They last a few minutes and occur multiple times a week.  Occurs when looking up but can also occur while sitting in the recliner watching TV ?  ?Restless leg is controlled.  Takes  gabapentin '600mg'$  at bedtime and ropinirole '3mg'$  at dinner and at bedtime.  If needed, she takes '300mg'$  gabapentin earlier in the evening. ?  ?Current NSAIDS:  ASA '81mg'$  daily ?Current analgesics:  no ?Current triptans:  sumatriptan '100mg'$  ?Current anti-emetic:  no ?Current muscle relaxants:  no ?Current anti-anxiolytic:  no ?Current sleep aide:  no ?Current Antihypertensive medications:  HCTZ ?Current Antidepressant medications:  fluoxetine '30mg'$  ?Current Anticonvulsant medications:  gabapentin '300mg'$  evening PRN and '600mg'$  at bedtime ?Other medications:  Ropinirole '3mg'$  dinner and '3mg'$  bedtime, amantadine '100mg'$  three times daily (fatigue)  ?  ?HISTORY: ?I RIGHT OPTIC NEURITIS AND OTHER RECURRENT SYMPTOMS WITH QUESTIONABLE MULTIPLE SCLEROSIS: ?She had an episode of right optic neuritis in 1998, presenting first as blurred vision and then complete vision loss.   A couple of weeks later, she developed right sided numbness of the face, arm and leg.  She underwent a lumbar puncture which demonstrated one oligoclonal band.  MRI of brain reportedly may have shown foci which may be concerning for MS.  MRI cervical spine reportedly did not demonstrate demyelinating lesions.  She was diagnosed with multiple sclerosis and treated with IV steroids.  The vision loss lasted 7-8 weeks.  The numbness lasted several days.  She was treated with DMT for several years.  She started on Betaseron but was then switched to Copaxone and then Avonex due to intolerability.  She had a repeat LP in 2002 which showed no oligoclonal bands and normal IgG index of 0.6.  Visual evoked potential in 2013 showed  absent right response and normal left response.  Repeat MRI from July 2014 was thought to be essentially normal so Avonex was discontinued.  Repeat MRI in November 2014 was unchanged.  Therefore, it was believed that she did not have MS.  NMO-IgG antibodies from 02/24/14 were negative.  MRI of cervical spine without contrast from 03/24/16 was personally  reviewed and revealed cervical spondylosis but no abnormal cord signal. ?  ?Since the episode in 1998, she endorses subtle weakness on the right side of her body.  For example, when she starts to write, her handwriting will start to get worse.  She also reports short-term memory deficits since 1998.  Over the years, she has had recurrent episodes of right eye pain with right sided numbness.  In the past, they were thought to be MS flares which were treated with steroids.  They occur when she is heated or fatigued.  They typically occur at least once a year and they last about 2 days.  She last had an episode in early January 2019, for which she was evaluated in the ED on 01/30/17.  She had an MRI of the brain and orbits with and without contrast, which was personally reviewed and demonstrated asymmetrically small right optic nerve compatible with sequelae of prior optic neuritis but no enhancement, as well as three nonspecific tiny hyperintense T2/FLAIR foci in the bifrontal periventricular white matter.  All of her symptoms have not progressed over the years and recurrence of these episodes have not increased since discontinuation of Avonex. ?  ?She was seen in the ED on 08/27/18 with 1 day of bilateral blurred vision, of gradual onset. It was monocular in either eye.  She first noticed it because she had trouble driving.  She had some right eye ache and dizziness but no headache or new numbness and tingling   MRI of brain/orbits and cervical spine with and without contrast appeared stable and showed no active demyelinating lesions or abnormal signal of optic nerves.  She was diagnosed by in-house neurology with migraine and was treated with migraine cocktail of toradol and Reglan.  She doesn't have vertigo but she feels "insecure" and problems with balance when ambulating.  Blurred vision was unchanged.  She followed up with her ophthalmologist, Dr. Gershon Crane.  Eye exam was unremarkable except for early cataracts.  ?   ?She has pain in the legs and gait issues.  She does have arthritis in the knees, which required surgery and had a subsequent DVT.  She is treated for restless leg syndrome, for which she takes ropinirole and gabapentin.  She has chronic burning in the hands. ?  ?She also reports chronic fatigue.  She has OSA and is on CPAP.  She takes amantadine. ?  ?II MIGRAINES ?She has history of migraines since early adulthood.  They are severe bifrontal pain associated with nausea, photophobia and phonophobia but no vomiting, new visual disturbance or unilateral numbness or weakness.  They last 1 to 2 days and usually occur once a year.  There are no specific triggers or relieving factors however eye gets more blurred when fatigued or with drop in blood sugar. ?  ? She also reports another migraine described as severe sharp right retro-orbital pain in September 2019.  One one occasion, pain radiated into right ear and posterior neck.  Constant but fluctuates.  There is associated blurred vision in right eye, dizziness, nausea, photophobia and phonophobia, eye twitching and fatigue.  No associated right sided numbness and weakness. ?  ?  ?  Past medication:  topiramate ? ?PAST MEDICAL HISTORY: ?Past Medical History:  ?Diagnosis Date  ? ADD (attention deficit disorder)   ? Anxiety   ? Biliary colic   ? Bladder leak   ? Diabetes mellitus without complication ([place])   ? type 2 , diet controlled   ? Diverticular disease   ? DVT (deep venous thrombosis) ([place])   ? left leg , resolved after treatment of blood thinners   ? Edema of both lower extremities   ? GERD (gastroesophageal reflux disease)   ? causes coughing   ? Glaucoma   ? High cholesterol   ? Hypertension   ? Hypothyroid   ? Leg cramps   ? "in my thighs"   ? Leg cramps   ? Migraines   ? Multiple sclerosis ([place])   ? per patient " that ha sbeen ruled out , they though it was that initially but it did not preogress like it"  ? Optic neuritis   ? lesion on right optic nerve; right  eye only , reduced vision and blurriness , loss of depth perception, glasses do not help when it flares    ? OSA (obstructive sleep apnea)   ? nightly cpap   ? Paresthesias/numbness   ? fingers and toes   ? Pneu

## 2021-05-19 ENCOUNTER — Encounter: Payer: Self-pay | Admitting: Neurology

## 2021-05-19 ENCOUNTER — Ambulatory Visit: Payer: HMO | Admitting: Neurology

## 2021-05-19 VITALS — BP 121/72 | HR 78 | Ht 66.0 in | Wt 269.0 lb

## 2021-05-19 DIAGNOSIS — G2581 Restless legs syndrome: Secondary | ICD-10-CM

## 2021-05-19 DIAGNOSIS — Z8669 Personal history of other diseases of the nervous system and sense organs: Secondary | ICD-10-CM

## 2021-05-19 DIAGNOSIS — G43109 Migraine with aura, not intractable, without status migrainosus: Secondary | ICD-10-CM | POA: Diagnosis not present

## 2021-05-19 NOTE — Patient Instructions (Signed)
Continue gabapentin and ropinirole as prescribed ?Try to use sumatriptan sparingly ?

## 2021-05-21 DIAGNOSIS — M5416 Radiculopathy, lumbar region: Secondary | ICD-10-CM | POA: Diagnosis not present

## 2021-05-21 DIAGNOSIS — M5136 Other intervertebral disc degeneration, lumbar region: Secondary | ICD-10-CM | POA: Diagnosis not present

## 2021-05-23 ENCOUNTER — Other Ambulatory Visit: Payer: Self-pay | Admitting: Neurology

## 2021-05-26 DIAGNOSIS — M5416 Radiculopathy, lumbar region: Secondary | ICD-10-CM | POA: Diagnosis not present

## 2021-05-27 DIAGNOSIS — G959 Disease of spinal cord, unspecified: Secondary | ICD-10-CM | POA: Diagnosis not present

## 2021-05-27 DIAGNOSIS — M546 Pain in thoracic spine: Secondary | ICD-10-CM | POA: Diagnosis not present

## 2021-05-27 DIAGNOSIS — M542 Cervicalgia: Secondary | ICD-10-CM | POA: Diagnosis not present

## 2021-05-28 ENCOUNTER — Other Ambulatory Visit: Payer: Self-pay | Admitting: Orthopedic Surgery

## 2021-05-28 DIAGNOSIS — M542 Cervicalgia: Secondary | ICD-10-CM

## 2021-05-28 DIAGNOSIS — M546 Pain in thoracic spine: Secondary | ICD-10-CM

## 2021-06-11 ENCOUNTER — Ambulatory Visit
Admission: RE | Admit: 2021-06-11 | Discharge: 2021-06-11 | Disposition: A | Discharge status: Home / Self Care | Payer: HMO | Source: Ambulatory Visit | Attending: Orthopedic Surgery | Admitting: Orthopedic Surgery

## 2021-06-11 DIAGNOSIS — M546 Pain in thoracic spine: Secondary | ICD-10-CM

## 2021-06-11 DIAGNOSIS — M542 Cervicalgia: Secondary | ICD-10-CM

## 2021-06-18 DIAGNOSIS — G959 Disease of spinal cord, unspecified: Secondary | ICD-10-CM | POA: Diagnosis not present

## 2021-06-28 ENCOUNTER — Other Ambulatory Visit: Payer: Self-pay | Admitting: Orthopedic Surgery

## 2021-06-29 ENCOUNTER — Other Ambulatory Visit: Payer: Self-pay | Admitting: Physician Assistant

## 2021-07-07 NOTE — Pre-Procedure Instructions (Signed)
Surgical Instructions    Your procedure is scheduled on Wednesday, June 21st.  Report to [facility] Main Entrance "A" at 10:00 A.M., then check in with the Admitting office.  Call this number if you have problems the morning of surgery:  [phone]   If you have any questions prior to your surgery date call [phone]: Open Monday-Friday 8am-4pm    Remember:  Do not eat after midnight the night before your surgery  You may drink clear liquids until 10:00 AM the morning of your surgery.   Clear liquids allowed are: Water, Non-Citrus Juices (without pulp), Carbonated Beverages, Clear Tea, Black Coffee Only (NO MILK, CREAM OR POWDERED CREAMER of any kind), and Gatorade.   Patient Instructions  The night before surgery:  No food after midnight. ONLY clear liquids after midnight   The day of surgery (if you have diabetes): Drink ONE (1) 12 oz G2 given to you in your pre admission testing appointment by 10:00 AM the morning of surgery. Drink in one sitting. Do not sip.  This drink was given to you during your hospital  pre-op appointment visit.  Nothing else to drink after completing the  12 oz bottle of G2.         If you have questions, please contact your surgeon's office.      Take these medicines the morning of surgery with A SIP OF WATER  amantadine (SYMMETREL)  levothyroxine (SYNTHROID, LEVOTHROID)  pantoprazole (PROTONIX) simvastatin (ZOCOR)    If needed: acetaminophen (TYLENOL) acetaminophen-codeine (TYLENOL #3)  SUMAtriptan (IMITREX)   Follow your surgeon's instructions on when to stop Aspirin.  If no instructions were given by your surgeon then you will need to call the office to get those instructions.     As of today, STOP taking any Aspirin (unless otherwise instructed by your surgeon) Aleve, Naproxen, Ibuprofen, Motrin, Advil, Goody's, BC's, all herbal medications, fish oil, and all vitamins.                     Do NOT Smoke (Tobacco/Vaping) for 24  hours prior to your procedure.  If you use a CPAP at night, you may bring your mask/headgear for your overnight stay.   Contacts, glasses, piercing's, hearing aid's, dentures or partials may not be worn into surgery, please bring cases for these belongings.    For patients admitted to the hospital, discharge time will be determined by your treatment team.   Patients discharged the day of surgery will not be allowed to drive home, and someone needs to stay with them for 24 hours.  SURGICAL WAITING ROOM VISITATION Patients having surgery or a procedure may have two support people in the waiting room. These visitors may be switched out with other visitors if needed. Children under the age of 10 must have an adult accompany them who is not the patient. If the patient needs to stay at the hospital during part of their recovery, the visitor guidelines for inpatient rooms apply.  Please refer to the [facility] website for the visitor guidelines for Inpatients (after your surgery is over and you are in a regular room).    Special instructions:   [place]- Preparing For Surgery  Before surgery, you can play an important role. Because skin is not sterile, your skin needs to be as free of germs as possible. You can reduce the number of germs on your skin by washing with CHG (chlorahexidine gluconate) Soap before surgery.  CHG is an antiseptic cleaner which  kills germs and bonds with the skin to continue killing germs even after washing.    Oral Hygiene is also important to reduce your risk of infection.  Remember - BRUSH YOUR TEETH THE MORNING OF SURGERY WITH YOUR REGULAR TOOTHPASTE  Please do not use if you have an allergy to CHG or antibacterial soaps. If your skin becomes reddened/irritated stop using the CHG.  Do not shave (including legs and underarms) for at least 48 hours prior to first CHG shower. It is OK to shave your face.  Please follow these instructions carefully.   Shower the  NIGHT BEFORE SURGERY and the MORNING OF SURGERY  If you chose to wash your hair, wash your hair first as usual with your normal shampoo.  After you shampoo, rinse your hair and body thoroughly to remove the shampoo.  Use CHG Soap as you would any other liquid soap. You can apply CHG directly to the skin and wash gently with a scrungie or a clean washcloth.   Apply the CHG Soap to your body ONLY FROM THE NECK DOWN.  Do not use on open wounds or open sores. Avoid contact with your eyes, ears, mouth and genitals (private parts). Wash Face and genitals (private parts)  with your normal soap.   Wash thoroughly, paying special attention to the area where your surgery will be performed.  Thoroughly rinse your body with warm water from the neck down.  DO NOT shower/wash with your normal soap after using and rinsing off the CHG Soap.  Pat yourself dry with a CLEAN TOWEL.  Wear CLEAN PAJAMAS to bed the night before surgery  Place CLEAN SHEETS on your bed the night before your surgery  DO NOT SLEEP WITH PETS.   Day of Surgery: Take a shower with CHG soap. Do not wear jewelry or makeup Do not wear lotions, powders, perfumes, or deodorant. Do not shave 48 hours prior to surgery.   Do not bring valuables to the hospital.  [facility] is not responsible for any belongings or valuables. Do not wear nail polish, gel polish, artificial nails, or any other type of covering on natural nails (fingers and toes) If you have artificial nails or gel coating that need to be removed by a nail salon, please have this removed prior to surgery. Artificial nails or gel coating may interfere with anesthesia's ability to adequately monitor your vital signs. Wear Clean/Comfortable clothing the morning of surgery Remember to brush your teeth WITH YOUR REGULAR TOOTHPASTE.   Please read over the following fact sheets that you were given.    If you received a COVID test during your pre-op visit  it is requested  that you wear a mask when out in public, stay away from anyone that may not be feeling well and notify your surgeon if you develop symptoms. If you have been in contact with anyone that has tested positive in the last 10 days please notify you surgeon.

## 2021-07-08 ENCOUNTER — Encounter (HOSPITAL_COMMUNITY)
Admission: RE | Admit: 2021-07-08 | Discharge: 2021-07-08 | Disposition: A | Discharge status: Home / Self Care | Payer: HMO | Source: Ambulatory Visit | Attending: Orthopedic Surgery | Admitting: Orthopedic Surgery

## 2021-07-08 ENCOUNTER — Other Ambulatory Visit: Payer: Self-pay

## 2021-07-08 ENCOUNTER — Encounter (HOSPITAL_COMMUNITY): Payer: Self-pay

## 2021-07-08 VITALS — BP 127/67 | HR 77 | Temp 98.6°F | Resp 18 | Ht 66.0 in | Wt 274.8 lb

## 2021-07-08 DIAGNOSIS — Z01812 Encounter for preprocedural laboratory examination: Secondary | ICD-10-CM | POA: Diagnosis not present

## 2021-07-08 DIAGNOSIS — G959 Disease of spinal cord, unspecified: Secondary | ICD-10-CM | POA: Diagnosis not present

## 2021-07-08 DIAGNOSIS — Z01818 Encounter for other preprocedural examination: Secondary | ICD-10-CM

## 2021-07-08 DIAGNOSIS — I10 Essential (primary) hypertension: Secondary | ICD-10-CM | POA: Diagnosis not present

## 2021-07-08 HISTORY — DX: Cardiac murmur, unspecified: R01.1

## 2021-07-08 LAB — SURGICAL PCR SCREEN
MRSA, PCR: NEGATIVE
Staphylococcus aureus: NEGATIVE

## 2021-07-08 LAB — BASIC METABOLIC PANEL
Anion gap: 7 (ref 5–15)
BUN: 12 mg/dL (ref 8–23)
CO2: 28 mmol/L (ref 22–32)
Calcium: 8.8 mg/dL — ABNORMAL LOW (ref 8.9–10.3)
Chloride: 106 mmol/L (ref 98–111)
Creatinine, Ser: 0.89 mg/dL (ref 0.44–1.00)
GFR, Estimated: >60 mL/min (ref >60)
Glucose, Bld: 139 mg/dL — ABNORMAL HIGH (ref 70–99)
Potassium: 3.6 mmol/L (ref 3.5–5.1)
Sodium: 141 mmol/L (ref 135–145)

## 2021-07-08 LAB — TYPE AND SCREEN
ABO/RH(D): O POS
Antibody Screen: NEGATIVE

## 2021-07-08 LAB — CBC
HCT: 37.7 % (ref 36.0–46.0)
Hemoglobin: 12.3 g/dL (ref 12.0–15.0)
MCH: 27.9 pg (ref 26.0–34.0)
MCHC: 32.6 g/dL (ref 30.0–36.0)
MCV: 85.5 fL (ref 80.0–100.0)
Platelets: 252 10*3/uL (ref 150–400)
RBC: 4.41 MIL/uL (ref 3.87–5.11)
RDW: 13.2 % (ref 11.5–15.5)
WBC: 6 10*3/uL (ref 4.0–10.5)
nRBC: 0 % (ref 0.0–0.2)

## 2021-07-08 NOTE — Progress Notes (Signed)
PCP - Kelton Pillar MD Cardiologist - Denies  PPM/ICD - Denies Device Orders -  Rep Notified -   Chest x-ray - NI EKG - 08/17/20 Stress Test - 02/09/15 ECHO - 02/10/15 Cardiac Cath - Denies  Sleep Study - Yes has OSA CPAP - Nightly  Pre diabetic  Blood Thinner Instructions:Denies Aspirin Instructions:per patient instructed to stop aspirin one week prior. Last dose 07/07/21  ERAS Protcol -Yes  PRE-SURGERY G2- given   COVID TEST- No   Anesthesia review -  No   Patient denies shortness of breath, fever, cough and chest pain at PAT appointment   All instructions explained to the patient, with a verbal understanding of the material. Patient agrees to go over the instructions while at home for a better understanding.  The opportunity to ask questions was provided.

## 2021-07-13 NOTE — Progress Notes (Signed)
Pt called today stating that she cannot drink the G2 because the artificial sweeteners cause migraines for her. She was wanting to know if regular Gatorade was ok for her to use. I told her that since she is diabetic, just drink 10 ounces of water instead of the G2. She voiced understanding.

## 2021-07-14 ENCOUNTER — Inpatient Hospital Stay (HOSPITAL_COMMUNITY): Payer: HMO | Admitting: Anesthesiology

## 2021-07-14 ENCOUNTER — Other Ambulatory Visit: Payer: Self-pay

## 2021-07-14 ENCOUNTER — Encounter (HOSPITAL_COMMUNITY)
Admission: RE | Disposition: A | Discharge status: Home / Self Care | Payer: Self-pay | Source: Home / Self Care | Attending: Orthopedic Surgery

## 2021-07-14 ENCOUNTER — Inpatient Hospital Stay (HOSPITAL_COMMUNITY)
Admission: RE | Admit: 2021-07-14 | Discharge: 2021-07-15 | DRG: 030 | Disposition: A | Discharge status: Home / Self Care | Payer: HMO | Attending: Orthopedic Surgery | Admitting: Orthopedic Surgery

## 2021-07-14 ENCOUNTER — Inpatient Hospital Stay (HOSPITAL_COMMUNITY): Payer: HMO

## 2021-07-14 ENCOUNTER — Encounter (HOSPITAL_COMMUNITY): Payer: Self-pay | Admitting: Orthopedic Surgery

## 2021-07-14 DIAGNOSIS — Z90710 Acquired absence of both cervix and uterus: Secondary | ICD-10-CM | POA: Diagnosis not present

## 2021-07-14 DIAGNOSIS — Z87891 Personal history of nicotine dependence: Secondary | ICD-10-CM | POA: Diagnosis not present

## 2021-07-14 DIAGNOSIS — Z86718 Personal history of other venous thrombosis and embolism: Secondary | ICD-10-CM | POA: Diagnosis not present

## 2021-07-14 DIAGNOSIS — Z981 Arthrodesis status: Secondary | ICD-10-CM | POA: Diagnosis not present

## 2021-07-14 DIAGNOSIS — E039 Hypothyroidism, unspecified: Secondary | ICD-10-CM

## 2021-07-14 DIAGNOSIS — Z79890 Hormone replacement therapy (postmenopausal): Secondary | ICD-10-CM | POA: Diagnosis not present

## 2021-07-14 DIAGNOSIS — G959 Disease of spinal cord, unspecified: Secondary | ICD-10-CM | POA: Diagnosis not present

## 2021-07-14 DIAGNOSIS — Z8261 Family history of arthritis: Secondary | ICD-10-CM

## 2021-07-14 DIAGNOSIS — Z8249 Family history of ischemic heart disease and other diseases of the circulatory system: Secondary | ICD-10-CM | POA: Diagnosis not present

## 2021-07-14 DIAGNOSIS — E119 Type 2 diabetes mellitus without complications: Secondary | ICD-10-CM | POA: Diagnosis present

## 2021-07-14 DIAGNOSIS — M5001 Cervical disc disorder with myelopathy,  high cervical region: Secondary | ICD-10-CM

## 2021-07-14 DIAGNOSIS — I10 Essential (primary) hypertension: Secondary | ICD-10-CM | POA: Diagnosis not present

## 2021-07-14 DIAGNOSIS — Z79899 Other long term (current) drug therapy: Secondary | ICD-10-CM | POA: Diagnosis not present

## 2021-07-14 DIAGNOSIS — M5021 Other cervical disc displacement,  high cervical region: Secondary | ICD-10-CM | POA: Diagnosis not present

## 2021-07-14 DIAGNOSIS — Z9989 Dependence on other enabling machines and devices: Secondary | ICD-10-CM | POA: Diagnosis not present

## 2021-07-14 DIAGNOSIS — Z9049 Acquired absence of other specified parts of digestive tract: Secondary | ICD-10-CM | POA: Diagnosis not present

## 2021-07-14 DIAGNOSIS — H409 Unspecified glaucoma: Secondary | ICD-10-CM | POA: Diagnosis present

## 2021-07-14 DIAGNOSIS — F988 Other specified behavioral and emotional disorders with onset usually occurring in childhood and adolescence: Secondary | ICD-10-CM | POA: Diagnosis not present

## 2021-07-14 DIAGNOSIS — K219 Gastro-esophageal reflux disease without esophagitis: Secondary | ICD-10-CM | POA: Diagnosis not present

## 2021-07-14 DIAGNOSIS — G9529 Other cord compression: Secondary | ICD-10-CM | POA: Diagnosis not present

## 2021-07-14 DIAGNOSIS — E7800 Pure hypercholesterolemia, unspecified: Secondary | ICD-10-CM | POA: Diagnosis present

## 2021-07-14 DIAGNOSIS — Z7982 Long term (current) use of aspirin: Secondary | ICD-10-CM

## 2021-07-14 DIAGNOSIS — M5412 Radiculopathy, cervical region: Secondary | ICD-10-CM | POA: Diagnosis not present

## 2021-07-14 DIAGNOSIS — M50121 Cervical disc disorder at C4-C5 level with radiculopathy: Secondary | ICD-10-CM | POA: Diagnosis not present

## 2021-07-14 DIAGNOSIS — M2578 Osteophyte, vertebrae: Secondary | ICD-10-CM

## 2021-07-14 DIAGNOSIS — Z9109 Other allergy status, other than to drugs and biological substances: Secondary | ICD-10-CM

## 2021-07-14 DIAGNOSIS — G2581 Restless legs syndrome: Secondary | ICD-10-CM | POA: Diagnosis present

## 2021-07-14 DIAGNOSIS — G9520 Unspecified cord compression: Principal | ICD-10-CM | POA: Diagnosis present

## 2021-07-14 DIAGNOSIS — G4733 Obstructive sleep apnea (adult) (pediatric): Secondary | ICD-10-CM | POA: Diagnosis not present

## 2021-07-14 DIAGNOSIS — F419 Anxiety disorder, unspecified: Secondary | ICD-10-CM | POA: Diagnosis present

## 2021-07-14 DIAGNOSIS — M5410 Radiculopathy, site unspecified: Secondary | ICD-10-CM | POA: Diagnosis present

## 2021-07-14 DIAGNOSIS — M5010 Cervical disc disorder with radiculopathy, unspecified cervical region: Principal | ICD-10-CM

## 2021-07-14 DIAGNOSIS — M4322 Fusion of spine, cervical region: Secondary | ICD-10-CM | POA: Diagnosis not present

## 2021-07-14 HISTORY — PX: ANTERIOR CERVICAL DECOMPRESSION/DISCECTOMY FUSION 4 LEVELS: SHX5556

## 2021-07-14 LAB — ABO/RH: ABO/RH(D): O POS

## 2021-07-14 LAB — GLUCOSE, CAPILLARY
Glucose-Capillary: 102 mg/dL — ABNORMAL HIGH (ref 70–99)
Glucose-Capillary: 142 mg/dL — ABNORMAL HIGH (ref 70–99)
Glucose-Capillary: 222 mg/dL — ABNORMAL HIGH (ref 70–99)

## 2021-07-14 SURGERY — ANTERIOR CERVICAL DECOMPRESSION/DISCECTOMY FUSION 4 LEVELS
Anesthesia: General | Site: Spine Cervical

## 2021-07-14 MED ORDER — ZOLPIDEM TARTRATE 5 MG PO TABS: 5.0000 mg | ORAL_TABLET | Freq: Every evening | ORAL | Status: DC | PRN

## 2021-07-14 MED ORDER — PROPOFOL 10 MG/ML IV BOLUS
INTRAVENOUS | Status: DC | PRN
  Administered 2021-07-14: 120 mg via INTRAVENOUS

## 2021-07-14 MED ORDER — ACETAMINOPHEN 650 MG RE SUPP: 650.0000 mg | RECTAL | Status: DC | PRN

## 2021-07-14 MED ORDER — ACETAMINOPHEN 500 MG PO TABS
1000.0000 mg | ORAL_TABLET | Freq: Once | ORAL | Status: AC
  Administered 2021-07-14: 1000 mg via ORAL
  Filled 2021-07-14: qty 2

## 2021-07-14 MED ORDER — LIDOCAINE 2% (20 MG/ML) 5 ML SYRINGE
INTRAMUSCULAR | Status: DC | PRN
  Administered 2021-07-14: 100 mg via INTRAVENOUS

## 2021-07-14 MED ORDER — BUPIVACAINE-EPINEPHRINE 0.25% -1:200000 IJ SOLN
INTRAMUSCULAR | Status: DC | PRN
  Administered 2021-07-14: 6 mL

## 2021-07-14 MED ORDER — PHENYLEPHRINE 80 MCG/ML (10ML) SYRINGE FOR IV PUSH (FOR BLOOD PRESSURE SUPPORT)
PREFILLED_SYRINGE | INTRAVENOUS | Status: AC
  Filled 2021-07-14: qty 10

## 2021-07-14 MED ORDER — GABAPENTIN 300 MG PO CAPS
300.0000 mg | ORAL_CAPSULE | Freq: Two times a day (BID) | ORAL | Status: DC
  Administered 2021-07-14: 300 mg via ORAL
  Filled 2021-07-14: qty 1

## 2021-07-14 MED ORDER — LACTATED RINGERS IV SOLN: INTRAVENOUS | Status: DC | PRN

## 2021-07-14 MED ORDER — FENTANYL CITRATE (PF) 100 MCG/2ML IJ SOLN
INTRAMUSCULAR | Status: AC
  Filled 2021-07-14: qty 2

## 2021-07-14 MED ORDER — ONDANSETRON HCL 4 MG/2ML IJ SOLN
INTRAMUSCULAR | Status: AC
  Filled 2021-07-14: qty 2

## 2021-07-14 MED ORDER — SUMATRIPTAN SUCCINATE 100 MG PO TABS
100.0000 mg | ORAL_TABLET | ORAL | Status: DC | PRN
Start: 2021-07-14 — End: 2021-07-15
  Filled 2021-07-14: qty 1

## 2021-07-14 MED ORDER — LIDOCAINE 2% (20 MG/ML) 5 ML SYRINGE
INTRAMUSCULAR | Status: AC
  Filled 2021-07-14: qty 5

## 2021-07-14 MED ORDER — ONDANSETRON HCL 4 MG PO TABS
4.0000 mg | ORAL_TABLET | Freq: Four times a day (QID) | ORAL | Status: DC | PRN
Start: 2021-07-14 — End: 2021-07-15

## 2021-07-14 MED ORDER — MIDAZOLAM HCL 2 MG/2ML IJ SOLN
INTRAMUSCULAR | Status: AC
  Filled 2021-07-14: qty 2

## 2021-07-14 MED ORDER — DOCUSATE SODIUM 100 MG PO CAPS
100.0000 mg | ORAL_CAPSULE | Freq: Two times a day (BID) | ORAL | Status: DC
  Administered 2021-07-14: 100 mg via ORAL
  Filled 2021-07-14: qty 1

## 2021-07-14 MED ORDER — METHOCARBAMOL 750 MG PO TABS: 750.0000 mg | ORAL_TABLET | Freq: Four times a day (QID) | ORAL | 0 refills | Status: DC | PRN

## 2021-07-14 MED ORDER — 0.9 % SODIUM CHLORIDE (POUR BTL) OPTIME
TOPICAL | Status: DC | PRN
  Administered 2021-07-14: 1000 mL

## 2021-07-14 MED ORDER — PROPOFOL 10 MG/ML IV BOLUS
INTRAVENOUS | Status: AC
  Filled 2021-07-14: qty 20

## 2021-07-14 MED ORDER — PANTOPRAZOLE SODIUM 40 MG PO TBEC
40.0000 mg | DELAYED_RELEASE_TABLET | Freq: Two times a day (BID) | ORAL | Status: DC
  Administered 2021-07-14: 40 mg via ORAL
  Filled 2021-07-14: qty 1

## 2021-07-14 MED ORDER — FLUOXETINE HCL 20 MG PO CAPS
20.0000 mg | ORAL_CAPSULE | Freq: Every day | ORAL | Status: DC
  Administered 2021-07-14: 20 mg via ORAL
  Filled 2021-07-14 (×2): qty 1

## 2021-07-14 MED ORDER — ROCURONIUM BROMIDE 10 MG/ML (PF) SYRINGE
PREFILLED_SYRINGE | INTRAVENOUS | Status: DC | PRN
  Administered 2021-07-14: 100 mg via INTRAVENOUS
  Administered 2021-07-14: 40 mg via INTRAVENOUS

## 2021-07-14 MED ORDER — SUGAMMADEX SODIUM 200 MG/2ML IV SOLN
INTRAVENOUS | Status: DC | PRN
  Administered 2021-07-14: 200 mg via INTRAVENOUS

## 2021-07-14 MED ORDER — LEVOTHYROXINE SODIUM 75 MCG PO TABS
150.0000 ug | ORAL_TABLET | Freq: Every day | ORAL | Status: DC
  Administered 2021-07-15: 150 ug via ORAL
  Filled 2021-07-14: qty 2

## 2021-07-14 MED ORDER — METHOCARBAMOL 500 MG PO TABS
500.0000 mg | ORAL_TABLET | Freq: Four times a day (QID) | ORAL | Status: DC | PRN
  Administered 2021-07-14 – 2021-07-15 (×2): 500 mg via ORAL
  Filled 2021-07-14 (×2): qty 1

## 2021-07-14 MED ORDER — CEFAZOLIN IN SODIUM CHLORIDE 3-0.9 GM/100ML-% IV SOLN
3.0000 g | INTRAVENOUS | Status: AC
  Administered 2021-07-14: 3 g via INTRAVENOUS
  Filled 2021-07-14: qty 100

## 2021-07-14 MED ORDER — THROMBIN 20000 UNITS EX SOLR
CUTANEOUS | Status: AC
  Filled 2021-07-14: qty 20000

## 2021-07-14 MED ORDER — SIMVASTATIN 20 MG PO TABS
10.0000 mg | ORAL_TABLET | Freq: Every day | ORAL | Status: DC
  Administered 2021-07-14: 10 mg via ORAL
  Filled 2021-07-14: qty 1

## 2021-07-14 MED ORDER — DEXAMETHASONE SODIUM PHOSPHATE 10 MG/ML IJ SOLN
INTRAMUSCULAR | Status: AC
  Filled 2021-07-14: qty 1

## 2021-07-14 MED ORDER — AMANTADINE HCL 100 MG PO CAPS
100.0000 mg | ORAL_CAPSULE | Freq: Every day | ORAL | Status: DC
Start: 2021-07-14 — End: 2021-07-15
  Filled 2021-07-14 (×2): qty 1

## 2021-07-14 MED ORDER — ACETAMINOPHEN 325 MG PO TABS: 650.0000 mg | ORAL_TABLET | ORAL | Status: DC | PRN

## 2021-07-14 MED ORDER — MIDAZOLAM HCL 2 MG/2ML IJ SOLN
INTRAMUSCULAR | Status: DC | PRN
  Administered 2021-07-14: 2 mg via INTRAVENOUS

## 2021-07-14 MED ORDER — DEXAMETHASONE SODIUM PHOSPHATE 10 MG/ML IJ SOLN
INTRAMUSCULAR | Status: DC | PRN
  Administered 2021-07-14: 10 mg via INTRAVENOUS

## 2021-07-14 MED ORDER — ONDANSETRON HCL 4 MG/2ML IJ SOLN
INTRAMUSCULAR | Status: DC | PRN
  Administered 2021-07-14: 4 mg via INTRAVENOUS

## 2021-07-14 MED ORDER — ALUM & MAG HYDROXIDE-SIMETH 200-200-20 MG/5ML PO SUSP: 30.0000 mL | Freq: Four times a day (QID) | ORAL | Status: DC | PRN

## 2021-07-14 MED ORDER — PANTOPRAZOLE SODIUM 40 MG IV SOLR: 40.0000 mg | Freq: Every day | INTRAVENOUS | Status: DC

## 2021-07-14 MED ORDER — LACTATED RINGERS IV SOLN: INTRAVENOUS | Status: DC

## 2021-07-14 MED ORDER — HYDROCODONE-ACETAMINOPHEN 5-325 MG PO TABS
1.0000 | ORAL_TABLET | ORAL | Status: DC | PRN
  Administered 2021-07-14 – 2021-07-15 (×3): 2 via ORAL
  Filled 2021-07-14 (×3): qty 2
  Filled 2021-07-14: qty 1

## 2021-07-14 MED ORDER — METHOCARBAMOL 1000 MG/10ML IJ SOLN: 500.0000 mg | Freq: Four times a day (QID) | INTRAVENOUS | Status: DC | PRN

## 2021-07-14 MED ORDER — SODIUM CHLORIDE 0.9% FLUSH: 3.0000 mL | Freq: Two times a day (BID) | INTRAVENOUS | Status: DC

## 2021-07-14 MED ORDER — FENTANYL CITRATE (PF) 100 MCG/2ML IJ SOLN
25.0000 ug | INTRAMUSCULAR | Status: DC | PRN
  Administered 2021-07-14 (×2): 25 ug via INTRAVENOUS

## 2021-07-14 MED ORDER — PHENYLEPHRINE 80 MCG/ML (10ML) SYRINGE FOR IV PUSH (FOR BLOOD PRESSURE SUPPORT)
PREFILLED_SYRINGE | INTRAVENOUS | Status: DC | PRN
  Administered 2021-07-14: 160 ug via INTRAVENOUS

## 2021-07-14 MED ORDER — ROPINIROLE HCL 1 MG PO TABS
3.0000 mg | ORAL_TABLET | Freq: Every evening | ORAL | Status: DC
Start: 2021-07-14 — End: 2021-07-15
  Administered 2021-07-14: 3 mg via ORAL
  Filled 2021-07-14: qty 3

## 2021-07-14 MED ORDER — FLEET ENEMA 7-19 GM/118ML RE ENEM: 1.0000 | ENEMA | Freq: Once | RECTAL | Status: DC | PRN

## 2021-07-14 MED ORDER — ORAL CARE MOUTH RINSE: 15.0000 mL | Freq: Once | OROMUCOSAL | Status: AC

## 2021-07-14 MED ORDER — HYDROCHLOROTHIAZIDE 12.5 MG PO TABS: 12.5000 mg | ORAL_TABLET | Freq: Every day | ORAL | Status: DC

## 2021-07-14 MED ORDER — CEFAZOLIN SODIUM-DEXTROSE 2-4 GM/100ML-% IV SOLN
2.0000 g | Freq: Three times a day (TID) | INTRAVENOUS | Status: AC
  Administered 2021-07-14 – 2021-07-15 (×2): 2 g via INTRAVENOUS
  Filled 2021-07-14 (×2): qty 100

## 2021-07-14 MED ORDER — SENNOSIDES-DOCUSATE SODIUM 8.6-50 MG PO TABS: 1.0000 | ORAL_TABLET | Freq: Every evening | ORAL | Status: DC | PRN

## 2021-07-14 MED ORDER — PHENYLEPHRINE HCL-NACL 20-0.9 MG/250ML-% IV SOLN
INTRAVENOUS | Status: DC | PRN
  Administered 2021-07-14: 10 ug/min via INTRAVENOUS

## 2021-07-14 MED ORDER — HYDROCODONE-ACETAMINOPHEN 5-325 MG PO TABS: 1.0000 | ORAL_TABLET | Freq: Four times a day (QID) | ORAL | 0 refills | Status: DC | PRN

## 2021-07-14 MED ORDER — FENTANYL CITRATE (PF) 250 MCG/5ML IJ SOLN
INTRAMUSCULAR | Status: AC
  Filled 2021-07-14: qty 5

## 2021-07-14 MED ORDER — ONDANSETRON HCL 4 MG/2ML IJ SOLN: 4.0000 mg | Freq: Four times a day (QID) | INTRAMUSCULAR | Status: DC | PRN

## 2021-07-14 MED ORDER — MENTHOL 3 MG MT LOZG: 1.0000 | LOZENGE | OROMUCOSAL | Status: DC | PRN

## 2021-07-14 MED ORDER — SODIUM CHLORIDE 0.9% FLUSH: 3.0000 mL | INTRAVENOUS | Status: DC | PRN

## 2021-07-14 MED ORDER — CHLORHEXIDINE GLUCONATE 0.12 % MT SOLN
15.0000 mL | Freq: Once | OROMUCOSAL | Status: AC
  Administered 2021-07-14: 15 mL via OROMUCOSAL
  Filled 2021-07-14: qty 15

## 2021-07-14 MED ORDER — ROCURONIUM BROMIDE 10 MG/ML (PF) SYRINGE
PREFILLED_SYRINGE | INTRAVENOUS | Status: AC
  Filled 2021-07-14: qty 20

## 2021-07-14 MED ORDER — BISACODYL 5 MG PO TBEC: 5.0000 mg | DELAYED_RELEASE_TABLET | Freq: Every day | ORAL | Status: DC | PRN

## 2021-07-14 MED ORDER — BUPIVACAINE-EPINEPHRINE (PF) 0.25% -1:200000 IJ SOLN
INTRAMUSCULAR | Status: AC
  Filled 2021-07-14: qty 30

## 2021-07-14 MED ORDER — FENTANYL CITRATE (PF) 250 MCG/5ML IJ SOLN
INTRAMUSCULAR | Status: DC | PRN
Start: 2021-07-14 — End: 2021-07-14
  Administered 2021-07-14 (×2): 50 ug via INTRAVENOUS
  Administered 2021-07-14: 100 ug via INTRAVENOUS
  Administered 2021-07-14: 50 ug via INTRAVENOUS

## 2021-07-14 MED ORDER — FLUOXETINE HCL 10 MG PO CAPS
10.0000 mg | ORAL_CAPSULE | Freq: Every day | ORAL | Status: DC
  Administered 2021-07-14: 10 mg via ORAL
  Filled 2021-07-14: qty 1

## 2021-07-14 MED ORDER — SODIUM CHLORIDE 0.9 % IV SOLN: 250.0000 mL | INTRAVENOUS | Status: DC

## 2021-07-14 MED ORDER — PHENOL 1.4 % MT LIQD
1.0000 | OROMUCOSAL | Status: DC | PRN
Start: 2021-07-14 — End: 2021-07-15

## 2021-07-14 MED ORDER — THROMBIN 20000 UNITS EX SOLR
OROMUCOSAL | Status: DC | PRN
  Administered 2021-07-14: 20 mL via TOPICAL

## 2021-07-14 SURGICAL SUPPLY — 77 items
BAG COUNTER SPONGE SURGICOUNT (BAG) ×2 IMPLANT
BENZOIN TINCTURE PRP APPL 2/3 (GAUZE/BANDAGES/DRESSINGS) ×2 IMPLANT
BIT DRILL NEURO 2X3.1 SFT TUCH (MISCELLANEOUS) ×1 IMPLANT
BIT DRILL SRG 14X2.2XFLT CHK (BIT) IMPLANT
BIT DRL SRG 14X2.2XFLT CHK (BIT) ×1
BLADE CLIPPER SURG (BLADE) ×1 IMPLANT
BLADE SURG 15 STRL LF DISP TIS (BLADE) ×1 IMPLANT
BLADE SURG 15 STRL SS (BLADE)
BONE VIVIGEN FORMABLE 1.3CC (Bone Implant) ×4 IMPLANT
BUR MATCHSTICK NEURO 3.0 LAGG (BURR) IMPLANT
CARTRIDGE OIL MAESTRO DRILL (MISCELLANEOUS) ×1 IMPLANT
COVER SURGICAL LIGHT HANDLE (MISCELLANEOUS) ×2 IMPLANT
DEVICE ENDSKLTN IMPLANT SM 7MM (Cage) IMPLANT
DIFFUSER DRILL AIR PNEUMATIC (MISCELLANEOUS) ×2 IMPLANT
DRAIN JACKSON RD 7FR 3/32 (WOUND CARE) IMPLANT
DRAPE C-ARM 42X72 X-RAY (DRAPES) ×2 IMPLANT
DRAPE POUCH INSTRU U-SHP 10X18 (DRAPES) ×2 IMPLANT
DRAPE SURG 17X23 STRL (DRAPES) ×4 IMPLANT
DRILL BIT SKYLINE 14MM (BIT) ×2
DRILL NEURO 2X3.1 SOFT TOUCH (MISCELLANEOUS) ×2
DURAPREP 26ML APPLICATOR (WOUND CARE) ×2 IMPLANT
ELECT COATED BLADE 2.86 ST (ELECTRODE) ×2 IMPLANT
ELECT REM PT RETURN 9FT ADLT (ELECTROSURGICAL) ×2
ELECTRODE REM PT RTRN 9FT ADLT (ELECTROSURGICAL) ×1 IMPLANT
ENDOSKELETON IMPLANT SM 7MM (Cage) ×4 IMPLANT
EVACUATOR SILICONE 100CC (DRAIN) IMPLANT
GAUZE 4X4 16PLY ~~LOC~~+RFID DBL (SPONGE) ×2 IMPLANT
GAUZE SPONGE 4X4 12PLY STRL (GAUZE/BANDAGES/DRESSINGS) ×2 IMPLANT
GLOVE BIO SURGEON STRL SZ7 (GLOVE) ×2 IMPLANT
GLOVE BIO SURGEON STRL SZ8 (GLOVE) ×2 IMPLANT
GLOVE BIOGEL PI IND STRL 7.0 (GLOVE) ×2 IMPLANT
GLOVE BIOGEL PI IND STRL 8 (GLOVE) ×1 IMPLANT
GLOVE BIOGEL PI INDICATOR 7.0 (GLOVE) ×2
GLOVE BIOGEL PI INDICATOR 8 (GLOVE) ×1
GLOVE SURG ENC MOIS LTX SZ6.5 (GLOVE) ×2 IMPLANT
GOWN STRL REUS W/ TWL LRG LVL3 (GOWN DISPOSABLE) ×1 IMPLANT
GOWN STRL REUS W/ TWL XL LVL3 (GOWN DISPOSABLE) ×1 IMPLANT
GOWN STRL REUS W/TWL LRG LVL3 (GOWN DISPOSABLE) ×2
GOWN STRL REUS W/TWL XL LVL3 (GOWN DISPOSABLE) ×2
GRAFT BNE MATRIX VG FRMBL SM 1 (Bone Implant) IMPLANT
INTERLOCK LRDTC CRVCL VBR SM (Bone Implant) IMPLANT
IV CATH 14GX2 1/4 (CATHETERS) ×2 IMPLANT
KIT BASIN OR (CUSTOM PROCEDURE TRAY) ×2 IMPLANT
KIT TURNOVER KIT B (KITS) ×2 IMPLANT
LORDOTIC CERVICAL VBR SM 5MM (Bone Implant) ×2 IMPLANT
MANIFOLD NEPTUNE II (INSTRUMENTS) ×1 IMPLANT
NDL PRECISIONGLIDE 27X1.5 (NEEDLE) ×1 IMPLANT
NDL SPNL 20GX3.5 QUINCKE YW (NEEDLE) ×1 IMPLANT
NEEDLE PRECISIONGLIDE 27X1.5 (NEEDLE) ×2 IMPLANT
NEEDLE SPNL 20GX3.5 QUINCKE YW (NEEDLE) ×2 IMPLANT
NS IRRIG 1000ML POUR BTL (IV SOLUTION) ×2 IMPLANT
OIL CARTRIDGE MAESTRO DRILL (MISCELLANEOUS) ×2
PACK ORTHO CERVICAL (CUSTOM PROCEDURE TRAY) ×2 IMPLANT
PAD ARMBOARD 7.5X6 YLW CONV (MISCELLANEOUS) ×4 IMPLANT
PATTIES SURGICAL .5 X.5 (GAUZE/BANDAGES/DRESSINGS) ×1 IMPLANT
PATTIES SURGICAL .5 X1 (DISPOSABLE) IMPLANT
PIN DISTRACTION 14 (PIN) ×2 IMPLANT
PLATE SKYLINE 3LVL 45MM CERV (Plate) ×1 IMPLANT
POSITIONER HEAD DONUT 9IN (MISCELLANEOUS) ×2 IMPLANT
SCREW SKYLINE VAR OS 14MM (Screw) ×8 IMPLANT
SPIKE FLUID TRANSFER (MISCELLANEOUS) ×1 IMPLANT
SPONGE INTESTINAL PEANUT (DISPOSABLE) ×4 IMPLANT
SPONGE SURGIFOAM ABS GEL 100 (HEMOSTASIS) ×2 IMPLANT
STRIP CLOSURE SKIN 1/2X4 (GAUZE/BANDAGES/DRESSINGS) ×2 IMPLANT
SURGIFLO W/THROMBIN 8M KIT (HEMOSTASIS) IMPLANT
SUT MNCRL AB 4-0 PS2 18 (SUTURE) ×2 IMPLANT
SUT VIC AB 2-0 CT2 18 VCP726D (SUTURE) ×2 IMPLANT
SYR BULB IRRIG 60ML STRL (SYRINGE) ×2 IMPLANT
SYR CONTROL 10ML LL (SYRINGE) ×4 IMPLANT
TAPE CLOTH 4X10 WHT NS (GAUZE/BANDAGES/DRESSINGS) ×1 IMPLANT
TAPE UMBILICAL 1/8 X36 TWILL (MISCELLANEOUS) ×2 IMPLANT
TAPE UMBILICAL COTTON 1/8X30 (MISCELLANEOUS) ×1 IMPLANT
TOWEL GREEN STERILE (TOWEL DISPOSABLE) ×2 IMPLANT
TOWEL GREEN STERILE FF (TOWEL DISPOSABLE) ×2 IMPLANT
TRAY FOLEY MTR SLVR 16FR STAT (SET/KITS/TRAYS/PACK) ×1 IMPLANT
WATER STERILE IRR 1000ML POUR (IV SOLUTION) ×1 IMPLANT
YANKAUER SUCT BULB TIP NO VENT (SUCTIONS) ×2 IMPLANT

## 2021-07-14 NOTE — Transfer of Care (Signed)
Immediate Anesthesia Transfer of Care Note  Patient: Anna Walker  Procedure(s) Performed: ANTERIOR CERVICAL DECOMPRESSION FUSION CERVICAL THREE- CERVICAL FOUR, CERVICAL FOUR- CERVICAL FIVE, CERVICAL FIVE- CERVICAL SIX WITH INSTRUMENTATION AND ALLOGRAFT (Spine Cervical)  Patient Location: PACU  Anesthesia Type:General  Level of Consciousness: awake, alert  and oriented  Airway & Oxygen Therapy: Patient Spontanous Breathing and Patient connected to face mask oxygen  Post-op Assessment: Report given to RN and Post -op Vital signs reviewed and stable  Post vital signs: Reviewed and stable  Last Vitals:  Vitals Value Taken Time  BP 157/66 07/14/21 1616  Temp    Pulse 92 07/14/21 1624  Resp 26 07/14/21 1624  SpO2 92 % 07/14/21 1624  Vitals shown include unvalidated device data.  Last Pain:  Vitals:   07/14/21 1020  TempSrc:   PainSc: 8       Patients Stated Pain Goal: 4 (41/28/78 6767)  Complications: No notable events documented.

## 2021-07-14 NOTE — H&P (Signed)
PREOPERATIVE H&P  Chief Complaint: Bilateral hand numbness, deterioration in balance, weakness  HPI: Anna Walker is a 66 y.o. female who presents with ongoing symptoms as outlined above, consistent with cervical myelopathy  MRI reveals spinal cord compression spanning C3-C6  Patient has failed multiple forms of conservative care and continues to have pain (see office notes for additional details regarding the patient's full course of treatment)  Past Medical History:  Diagnosis Date   ADD (attention deficit disorder)    Anxiety    Biliary colic    Bladder leak    Diabetes mellitus without complication ([place])    type 2 , diet controlled patient not taking any medications   Diverticular disease    DVT (deep venous thrombosis) ([facility])    left leg , resolved after treatment of blood thinners    Edema of both lower extremities    GERD (gastroesophageal reflux disease)    causes coughing    Glaucoma    Heart murmur    High cholesterol    Hypertension    Hypothyroid    Leg cramps    "in my thighs"    Leg cramps    Migraines    Multiple sclerosis ([place])    per patient " that ha sbeen ruled out , they though it was that initially but it did not preogress like it"   Optic neuritis    lesion on right optic nerve; right eye only , reduced vision and blurriness , loss of depth perception, glasses do not help when it flares     OSA (obstructive sleep apnea)    nightly cpap    Paresthesias/numbness    fingers and toes    Pneumonia 2017   Restless leg syndrome    Sleep apnea    SOB (shortness of breath)    Thyroid disease    Hypothyroidism   Past Surgical History:  Procedure Laterality Date   ABDOMINAL HYSTERECTOMY     BREAST LUMPECTOMY Left    CESAREAN SECTION     x2    CHOLECYSTECTOMY N/A 08/16/2018   Procedure: LAPAROSCOPIC CHOLECYSTECTOMY;  Surgeon: Greer Pickerel, MD;  Location: [facility] ORS;  Service: General;  Laterality: N/A;   COLONOSCOPY WITH PROPOFOL N/A  01/03/2019   Procedure: COLONOSCOPY WITH PROPOFOL;  Surgeon: Wonda Horner, MD;  Location: [facility] ENDOSCOPY;  Service: Endoscopy;  Laterality: N/A;   MENISCUS REPAIR Left left   MENISCUS REPAIR Right    knee    TUBAL LIGATION     during 2nd c section    Social History   Socioeconomic History   Marital status: Widowed    Spouse name: Not on file   Number of children: 2   Years of education: Not on file   Highest education level: Some college, no degree  Occupational History   Occupation: disabled  Tobacco Use   Smoking status: Former    Types: Cigarettes    Quit date: 01/25/1984    Years since quitting: 37.4   Smokeless tobacco: Never  Vaping Use   Vaping Use: Never used  Substance and Sexual Activity   Alcohol use: Not Currently   Drug use: No   Sexual activity: Not Currently    Birth control/protection: Surgical  Other Topics Concern   Not on file  Social History Narrative   Pt is right handed. She is married, lives with her husband in a 1 story house, ramp to enter. She drinks 2 cups of coffee a day.  Husband has passed away on 12/05/2020   Social Determinants of Health   Financial Resource Strain: Not on file  Food Insecurity: Not on file  Transportation Needs: Not on file  Physical Activity: Not on file  Stress: Not on file  Social Connections: Not on file   Family History  Problem Relation Age of Onset   Barrett's esophagus Mother    Hypertension Mother    Obesity Mother    Berenice Primas' disease Sister    Rheum arthritis Sister    Heart attack Father 74   Sudden death Father    Coronary artery disease Neg Hx    Allergies  Allergen Reactions   Adderall [Amphetamine-Dextroamphetamine] Other (See Comments)    Caused elevated BP   Topamax [Topiramate]     dizzy   Prior to Admission medications   Medication Sig Start Date End Date Taking? Authorizing Provider  acetaminophen (TYLENOL) 500 MG tablet Take 500 mg by mouth every 8 (eight) hours as needed for  moderate pain.   Yes [provider]  acetaminophen-codeine (TYLENOL #3) 300-30 MG tablet Take 1-2 tablets by mouth every 8 (eight) hours as needed for severe pain. 06/30/21  Yes [provider]  amantadine (SYMMETREL) 100 MG capsule TAKE 1 CAPSULE(100 MG) BY MOUTH THREE TIMES DAILY Patient taking differently: Take 100 mg by mouth daily. 05/24/21  Yes Tomi Likens, Adam R, DO  aspirin 81 MG EC tablet Take 81 mg by mouth daily. Swallow whole.   Yes [provider]  FLUoxetine (PROZAC) 10 MG capsule Take 10 mg by mouth at bedtime. Take with 20 mg dose 03/19/17  Yes [provider]  FLUoxetine (PROZAC) 20 MG capsule Take 20 mg by mouth at bedtime. Take with 10 mg dose 06/21/18  Yes [provider]  gabapentin (NEURONTIN) 300 MG capsule TAKE 1 CAPSULE BY MOUTH AT DINNER AND 2 CAPSULES AT BEDTIME Patient taking differently: Take 300 mg by mouth 2 (two) times daily. At dinner and bedtime 06/15/20  Yes Jaffe, Adam R, DO  hydrochlorothiazide (HYDRODIURIL) 12.5 MG tablet Take 12.5 mg by mouth daily.  02/06/17  Yes [provider]  levothyroxine (SYNTHROID, LEVOTHROID) 150 MCG tablet Take 150 mcg by mouth daily before breakfast.   Yes [provider]  Menthol, Topical Analgesic, (BIOFREEZE) 4 % GEL Apply 1 application. topically daily as needed (pain).   Yes [provider]  pantoprazole (PROTONIX) 40 MG tablet Take 40 mg by mouth 2 (two) times daily. 08/13/20  Yes [provider]  rOPINIRole (REQUIP) 3 MG tablet Take '3mg'$  at dinner if needed and '3mg'$  at bedtime Patient taking differently: Take 3 mg by mouth every evening. Take '3mg'$  at dinner 06/03/20  Yes Jaffe, Adam R, DO  simvastatin (ZOCOR) 10 MG tablet Take 10 mg by mouth daily.   Yes [provider]  SUMAtriptan (IMITREX) 100 MG tablet Take 1 tablet (100 mg total) by mouth every 2 (two) hours as needed for migraine. May repeat in 2 hours if headache persists or recurs. 07/07/20   Pieter Partridge, DO     All other systems have been reviewed and were otherwise negative with the exception of those mentioned in the HPI and as above.  Physical Exam: Vitals:   07/14/21 1009  BP: 139/68  Pulse: 72  Resp: 17  Temp: 97.8 F (36.6 C)  SpO2: 95%    Body mass index is 44.71 kg/m.  General: Alert, no acute distress Cardiovascular: No pedal edema Respiratory: No cyanosis, no use  of accessory musculature Skin: No lesions in the area of chief complaint Neurologic: Sensation intact distally Psychiatric: Patient is competent for consent with normal mood and affect Lymphatic: No axillary or cervical lymphadenopathy   Assessment/Plan: Progressive cervical myelopathy, with an MRI notable for spinal cord compression spanning C3-C6 Plan for Procedure(s): ANTERIOR CERVICAL DECOMPRESSION FUSION CERVICAL 3- CERVICAL 4, CERVICAL 4- CERVICAL 5, CERVICAL 5- CERVICAL 6 WITH INSTRUMENTATION AND ALLOGRAFT   Norva Karvonen, MD 07/14/2021 11:52 AM

## 2021-07-14 NOTE — Anesthesia Preprocedure Evaluation (Addendum)
Anesthesia Evaluation  Patient identified by MRN, date of birth, ID band Patient awake    Reviewed: Allergy & Precautions, NPO status , Patient's Chart, lab work & pertinent test results  Airway Mallampati: II  TM Distance: >3 FB Neck ROM: Full    Dental no notable dental hx. (+) Teeth Intact, Dental Advisory Given   Pulmonary sleep apnea and Continuous Positive Airway Pressure Ventilation , former smoker,    Pulmonary exam normal breath sounds clear to auscultation       Cardiovascular hypertension, + DOE and + DVT  Normal cardiovascular exam Rhythm:Regular Rate:Normal  TTE 2017 - Left ventricle: The cavity size was normal. Wall thickness was  normal. Systolic function was normal. The estimated ejection  fraction was in the range of 60% to 65%. Wall motion was normal;  there were no regional wall motion abnormalities.  - Left atrium: The atrium was moderately dilated.  - Atrial septum: No defect or patent foramen ovale was identified.    Neuro/Psych  Headaches, PSYCHIATRIC DISORDERS Anxiety    GI/Hepatic Neg liver ROS, GERD  ,  Endo/Other  diabetes, Well ControlledHypothyroidism Morbid obesity (BMI 44)  Renal/GU negative Renal ROS  negative genitourinary   Musculoskeletal negative musculoskeletal ROS (+)   Abdominal   Peds  Hematology negative hematology ROS (+)   Anesthesia Other Findings   Reproductive/Obstetrics                            Anesthesia Physical Anesthesia Plan  ASA: 3  Anesthesia Plan: General   Post-op Pain Management: Tylenol PO (pre-op)*   Induction: Intravenous  PONV Risk Score and Plan: 3 and Midazolam, Dexamethasone and Ondansetron  Airway Management Planned: Oral ETT and Video Laryngoscope Planned  Additional Equipment:   Intra-op Plan:   Post-operative Plan: Extubation in OR  Informed Consent: I have reviewed the patients History and Physical,  chart, labs and discussed the procedure including the risks, benefits and alternatives for the proposed anesthesia with the patient or authorized representative who has indicated his/her understanding and acceptance.     Dental advisory given  Plan Discussed with: CRNA  Anesthesia Plan Comments: (2 IVs)       Anesthesia Quick Evaluation

## 2021-07-14 NOTE — Anesthesia Procedure Notes (Signed)
Procedure Name: Intubation Date/Time: 07/14/2021 1:14 PM  Performed by: Betha Loa, CRNAPre-anesthesia Checklist: Patient identified, Emergency Drugs available, Suction available and Patient being monitored Patient Re-evaluated:Patient Re-evaluated prior to induction Oxygen Delivery Method: Circle system utilized Preoxygenation: Pre-oxygenation with 100% oxygen Induction Type: IV induction Ventilation: Mask ventilation without difficulty Laryngoscope Size: Glidescope and 3 Grade View: Grade I Tube type: Oral Number of attempts: 1 Airway Equipment and Method: Stylet and Oral airway Placement Confirmation: ETT inserted through vocal cords under direct vision, positive ETCO2 and breath sounds checked- equal and bilateral Secured at: 22 cm Tube secured with: Tape Dental Injury: Teeth and Oropharynx as per pre-operative assessment

## 2021-07-14 NOTE — Progress Notes (Signed)
Placed patient CPAP for the night via auto-mode

## 2021-07-14 NOTE — Op Note (Signed)
PATIENT NAME: Anna Walker RECORD NO.:   [id number]    DATE OF BIRTH: 02-May-1955   DATE OF PROCEDURE: 07/14/2021                               OPERATIVE REPORT     PREOPERATIVE DIAGNOSES: 1.  Progressive cervical myelopathy 2.  Spinal cord compression spanning C3-C6   POSTOPERATIVE DIAGNOSES: 1.  Progressive cervical myelopathy 2.  Spinal cord compression spanning C3-C6   PROCEDURE: 1. Anterior cervical decompression and fusion C3/4, C4/5, C5/6 2. Placement of anterior instrumentation, C3-C6. 3. Insertion of interbody device x 3 (Titan intervertebral spacers). 4. Intraoperative use of fluoroscopy. 5. Use of morselized allograft - ViviGen.   SURGEON:  Phylliss Bob, MD   ASSISTANT:  Pricilla Holm, PA-C.   ANESTHESIA:  General endotracheal anesthesia.   COMPLICATIONS:  None.   DISPOSITION:  Stable.   ESTIMATED BLOOD LOSS:  Minimal.   INDICATIONS FOR SURGERY:  Briefly, [name] is a pleasant 66 y.o. year- old patient, who did present to me with symptoms consistent with progressive cervical myelopathy.  The patient's MRI did reveal the findings noted above. Given the patient's progressive symptoms and MRI findings, we did discuss proceeding with the procedure noted above.  The patient was fully aware of the risks and limitations of surgery as outlined in my preoperative note.   OPERATIVE DETAILS:  On 07/14/2021, the patient was brought to surgery and general endotracheal anesthesia was administered.  The patient was placed supine on the hospital bed. The neck was gently extended.  All bony prominences were meticulously padded.  The neck was prepped and draped in the usual sterile fashion.  At this point, I did make a left-sided transverse incision.  The platysma was incised.  A Smith-Robinson approach was used and the anterior spine was identified. A self-retaining retractor was placed.  I then subperiosteally exposed the vertebral bodies from C3-C6.  Caspar  pins were then placed into the C5 and C6 vertebral bodies and distraction was applied.  A thorough and complete C5/6 intervertebral diskectomy was performed.  The posterior longitudinal ligament was identified and entered using a nerve hook.  I then used #1 followed by #2 Kerrison to perform a thorough and complete intervertebral diskectomy.  The spinal canal was thoroughly decompressed, as was the right and left neuroforamen.  The endplates were then prepared and the appropriate-sized intervertebral spacer was then packed with ViviGen and tamped into position in the usual fashion.  The lower Caspar pin was then removed and placed into the C4 vertebral body and once again, distraction was applied across the C4/5 intervertebral space.  I then again performed a thorough and complete diskectomy, thoroughly decompressing the spinal canal and bilateral neuroforamena.  After preparing the endplates, the appropriate-sized intervertebral spacer was packed with ViviGen and tamped into position.  The lower Caspar pin was then removed and placed into the C3 vertebral body and once again, distraction was applied across the C3/4 intervertebral space.  I then again performed a thorough and complete diskectomy, thoroughly decompressing the spinal canal and bilateral neuroforamena.  After preparing the endplates, the appropriate-sized intervertebral spacer was packed with ViviGen and tamped into position.  The Caspar pins then were removed and bone wax was placed in their place.  The appropriate-sized anterior cervical plate was placed over the anterior spine.  14 mm variable angle screws were placed, 2 in each vertebral body from  C3-C6 for a total of 8 vertebral body screws.  The screws were then locked to the plate using the Cam locking mechanism.  I was very pleased with the final fluoroscopic images.  The wound was then irrigated.  The wound was then explored for any undue bleeding and there was no  bleeding noted. The wound was then closed in layers using 2-0 Vicryl, followed by 4-0 Monocryl.  Benzoin and Steri-Strips were applied, followed by sterile dressing.  All instrument counts were correct at the termination of the procedure.   Of note, Pricilla Holm, PA-C, was my assistant throughout surgery, and did aid in retraction, suctioning, placement of the hardware, and closure from start to finish.       Phylliss Bob, MD

## 2021-07-15 ENCOUNTER — Encounter (HOSPITAL_COMMUNITY): Payer: Self-pay | Admitting: Orthopedic Surgery

## 2021-07-15 ENCOUNTER — Other Ambulatory Visit: Payer: Self-pay

## 2021-07-15 LAB — GLUCOSE, CAPILLARY: Glucose-Capillary: 148 mg/dL — ABNORMAL HIGH (ref 70–99)

## 2021-07-15 MED ORDER — METHOCARBAMOL 500 MG PO TABS: 500.0000 mg | ORAL_TABLET | Freq: Four times a day (QID) | ORAL | 2 refills | Status: DC | PRN

## 2021-07-15 MED ORDER — HYDROCODONE-ACETAMINOPHEN 5-325 MG PO TABS: 1.0000 | ORAL_TABLET | ORAL | 0 refills | Status: DC | PRN

## 2021-07-15 NOTE — Anesthesia Postprocedure Evaluation (Signed)
Anesthesia Post Note  Patient: Anna Walker  Procedure(s) Performed: ANTERIOR CERVICAL DECOMPRESSION FUSION CERVICAL THREE- CERVICAL FOUR, CERVICAL FOUR- CERVICAL FIVE, CERVICAL FIVE- CERVICAL SIX WITH INSTRUMENTATION AND ALLOGRAFT (Spine Cervical)     Patient location during evaluation: PACU Anesthesia Type: General Level of consciousness: awake and alert Pain management: pain level controlled Vital Signs Assessment: post-procedure vital signs reviewed and stable Respiratory status: spontaneous breathing, nonlabored ventilation, respiratory function stable and patient connected to nasal cannula oxygen Cardiovascular status: blood pressure returned to baseline and stable Postop Assessment: no apparent nausea or vomiting Anesthetic complications: no   No notable events documented.  Last Vitals:  Vitals:   07/15/21 0335 07/15/21 0735  BP: 134/71 125/70  Pulse: 79 73  Resp: 20 18  Temp: 36.7 C 36.7 C  SpO2: 95% 96%    Last Pain:  Vitals:   07/15/21 0735  TempSrc: Oral  PainSc: 2                  Kayven Aldaco L Lynnzie Blackson

## 2021-07-15 NOTE — Progress Notes (Signed)
Patient transported to her vehicle via wheelchair by volunteer for discharge home; in no acute distress nor complaints of pain nor discomfort; moves all extremities well; incision on her anterior neck with gauze dressing and is clean, dry and intact with Aspen collar on; room was checked for all her belongings; discharge instructions concerning her medications, incision care, follow up appointment and when to call the doctor as needed were all discussed with patient and her daughter by RN and both expressed understanding on the instructions given.

## 2021-07-15 NOTE — Progress Notes (Signed)
    Patient doing well  Denies arm pain Tolerating PO well  Patient states that she did have her granddaughter  to stay with her, but that it was reported yesterday that this was not the case because her mother wanted her to stay here overnight. Due to this report, the plan was changed and she was admitted for discharge today   Physical Exam: Vitals:   07/14/21 2325 07/15/21 0335  BP:  134/71  Pulse: 75 79  Resp: 18 20  Temp:  98.1 F (36.7 C)  SpO2: 96% 95%    Neck soft/supple Dressing in place NVI  POD #1 s/p ACDF, doing well  - encourage ambulation - Norco for pain, Robaxin for muscle spasms - d/c home today with f/u in 2 weeks

## 2021-07-15 NOTE — Plan of Care (Signed)

## 2021-07-21 NOTE — Discharge Summary (Signed)
Patient ID: Anna Walker MRN: [id number] DOB/AGE: [birth year] 64 y.o.  Admit date: 07/14/2021 Discharge date: 07/15/21  Admission Diagnoses:  Principal Problem:   Cervical myelopathy ([place]) Active Problems:   Radiculopathy   Discharge Diagnoses:  Same  Past Medical History:  Diagnosis Date   ADD (attention deficit disorder)    Anxiety    Biliary colic    Bladder leak    Diabetes mellitus without complication ([place])    type 2 , diet controlled patient not taking any medications   Diverticular disease    DVT (deep venous thrombosis) ([facility])    left leg , resolved after treatment of blood thinners    Edema of both lower extremities    GERD (gastroesophageal reflux disease)    causes coughing    Glaucoma    Heart murmur    High cholesterol    Hypertension    Hypothyroid    Leg cramps    "in my thighs"    Leg cramps    Migraines    Multiple sclerosis ([place])    per patient " that ha sbeen ruled out , they though it was that initially but it did not preogress like it"   Optic neuritis    lesion on right optic nerve; right eye only , reduced vision and blurriness , loss of depth perception, glasses do not help when it flares     OSA (obstructive sleep apnea)    nightly cpap    Paresthesias/numbness    fingers and toes    Pneumonia 2017   Restless leg syndrome    Sleep apnea    SOB (shortness of breath)    Thyroid disease    Hypothyroidism    Surgeries: Procedure(s): ANTERIOR CERVICAL DECOMPRESSION FUSION CERVICAL THREE- CERVICAL FOUR, CERVICAL FOUR- CERVICAL FIVE, CERVICAL FIVE- CERVICAL SIX WITH INSTRUMENTATION AND ALLOGRAFT on 07/14/2021   Consultants: None  Discharged Condition: Improved  Hospital Course: Anna Walker is an 66 y.o. female who was admitted 07/14/2021 for operative treatment of Cervical myelopathy ([place]). Patient has severe unremitting pain that affects sleep, daily activities, and work/hobbies. After pre-op clearance the patient was taken  to the operating room on 07/14/2021 and underwent  Procedure(s): ANTERIOR CERVICAL DECOMPRESSION FUSION CERVICAL THREE- CERVICAL FOUR, CERVICAL FOUR- CERVICAL FIVE, CERVICAL FIVE- CERVICAL SIX WITH INSTRUMENTATION AND ALLOGRAFT.    Patient was given perioperative antibiotics:  Anti-infectives (From admission, onward)    Start     Dose/Rate Route Frequency Ordered Stop   07/14/21 2130  ceFAZolin (ANCEF) IVPB 2g/100 mL premix        2 g 200 mL/hr over 30 Minutes Intravenous Every 8 hours 07/14/21 1848 07/15/21 0510   07/14/21 1015  ceFAZolin (ANCEF) IVPB 3g/100 mL premix        3 g 200 mL/hr over 30 Minutes Intravenous On call to O.R. 07/14/21 1004 07/14/21 1323        Patient was given sequential compression devices, early ambulation to prevent DVT.  Patient benefited maximally from hospital stay and there were no complications.    Recent vital signs: No data found.BP 125/70 (BP Location: Right Arm)   Pulse 73   Temp 98.1 F (36.7 C) (Oral)   Resp 18   Ht '5\' 6"'$  (1.676 m)   Wt 125.6 kg   SpO2 96%   BMI 44.71 kg/m    Discharge Medications:   Allergies as of 07/15/2021       Reactions   Adderall [amphetamine-dextroamphetamine] Other (See Comments)  Caused elevated BP   Topamax [topiramate]    dizzy        Medication List     STOP taking these medications    aspirin EC 81 MG tablet       TAKE these medications    acetaminophen 500 MG tablet Commonly known as: TYLENOL Take 500 mg by mouth every 8 (eight) hours as needed for moderate pain.   acetaminophen-codeine 300-30 MG tablet Commonly known as: TYLENOL #3 Take 1-2 tablets by mouth every 8 (eight) hours as needed for severe pain.   amantadine 100 MG capsule Commonly known as: SYMMETREL TAKE 1 CAPSULE(100 MG) BY MOUTH THREE TIMES DAILY What changed: See the new instructions.   Biofreeze 4 % Gel Generic drug: Menthol (Topical Analgesic) Apply 1 application. topically daily as needed (pain).    FLUoxetine 10 MG capsule Commonly known as: PROZAC Take 10 mg by mouth at bedtime. Take with 20 mg dose   FLUoxetine 20 MG capsule Commonly known as: PROZAC Take 20 mg by mouth at bedtime. Take with 10 mg dose   gabapentin 300 MG capsule Commonly known as: NEURONTIN TAKE 1 CAPSULE BY MOUTH AT DINNER AND 2 CAPSULES AT BEDTIME What changed: See the new instructions.   hydrochlorothiazide 12.5 MG tablet Commonly known as: HYDRODIURIL Take 12.5 mg by mouth daily.   HYDROcodone-acetaminophen 5-325 MG tablet Commonly known as: NORCO/VICODIN Take 1 tablet by mouth every 6 (six) hours as needed for moderate pain or severe pain.   HYDROcodone-acetaminophen 5-325 MG tablet Commonly known as: NORCO/VICODIN Take 1-2 tablets by mouth every 4 (four) hours as needed for moderate pain ((score 4 to 6)).   levothyroxine 150 MCG tablet Commonly known as: SYNTHROID Take 150 mcg by mouth daily before breakfast.   methocarbamol 750 MG tablet Commonly known as: ROBAXIN Take 1 tablet (750 mg total) by mouth every 6 (six) hours as needed for muscle spasms.   methocarbamol 500 MG tablet Commonly known as: ROBAXIN Take 1 tablet (500 mg total) by mouth every 6 (six) hours as needed for muscle spasms.   pantoprazole 40 MG tablet Commonly known as: PROTONIX Take 40 mg by mouth 2 (two) times daily.   rOPINIRole 3 MG tablet Commonly known as: REQUIP Take '3mg'$  at dinner if needed and '3mg'$  at bedtime What changed:  how much to take how to take this when to take this additional instructions   simvastatin 10 MG tablet Commonly known as: ZOCOR Take 10 mg by mouth daily.   SUMAtriptan 100 MG tablet Commonly known as: IMITREX Take 1 tablet (100 mg total) by mouth every 2 (two) hours as needed for migraine. May repeat in 2 hours if headache persists or recurs.        Diagnostic Studies: DG Cervical Spine 1 View  Result Date: 07/14/2021 CLINICAL DATA:  Anterior cervical fusion EXAM: DG  CERVICAL SPINE - 1 VIEW COMPARISON:  None Available. FINDINGS: Two lateral intraoperative views of the cervical spine intubated patient. Anterior cervical fusion plate extends from C3 to C6. Interbody spacers at C3-C4, C4-C5, and C5-C6. IMPRESSION: Intraoperative views as above. Electronically Signed   By: Suzy Bouchard M.D.   On: 07/14/2021 16:10   DG C-Arm 1-60 Min-No Report  Result Date: 07/14/2021 Fluoroscopy was utilized by the requesting physician.  No radiographic interpretation.   DG C-Arm 1-60 Min-No Report  Result Date: 07/14/2021 Fluoroscopy was utilized by the requesting physician.  No radiographic interpretation.   DG C-Arm 1-60 Min-No Report  Result Date: 07/14/2021  Fluoroscopy was utilized by the requesting physician.  No radiographic interpretation.    Disposition: Discharge disposition: 01-Home or Self Care       Discharge Instructions     Discharge patient   Complete by: As directed    Discharge disposition: 01-Home or Self Care   Discharge patient date: 07/14/2021       POD #1 s/p ACDF, doing well   - encourage ambulation - Norco for pain, Robaxin for muscle spasms -Scripts for pain sent to pharmacy electronically  -D/C instructions sheet printed and in chart -D/C today  -F/U in office 2 weeks   Signed: Lennie Muckle Renad Jenniges 07/21/2021, 3:27 PM

## 2021-07-28 DIAGNOSIS — Z9889 Other specified postprocedural states: Secondary | ICD-10-CM | POA: Diagnosis not present

## 2021-07-28 DIAGNOSIS — G9529 Other cord compression: Secondary | ICD-10-CM | POA: Diagnosis not present

## 2021-08-27 DIAGNOSIS — M50022 Cervical disc disorder at C5-C6 level with myelopathy: Secondary | ICD-10-CM | POA: Diagnosis not present

## 2021-08-27 DIAGNOSIS — M50021 Cervical disc disorder at C4-C5 level with myelopathy: Secondary | ICD-10-CM | POA: Diagnosis not present

## 2021-09-01 ENCOUNTER — Encounter (INDEPENDENT_AMBULATORY_CARE_PROVIDER_SITE_OTHER): Payer: Self-pay

## 2021-09-01 DIAGNOSIS — M170 Bilateral primary osteoarthritis of knee: Secondary | ICD-10-CM | POA: Diagnosis not present

## 2021-09-08 DIAGNOSIS — E785 Hyperlipidemia, unspecified: Secondary | ICD-10-CM | POA: Diagnosis not present

## 2021-09-08 DIAGNOSIS — I10 Essential (primary) hypertension: Secondary | ICD-10-CM | POA: Diagnosis not present

## 2021-09-08 DIAGNOSIS — E1169 Type 2 diabetes mellitus with other specified complication: Secondary | ICD-10-CM | POA: Diagnosis not present

## 2021-09-08 DIAGNOSIS — E559 Vitamin D deficiency, unspecified: Secondary | ICD-10-CM | POA: Diagnosis not present

## 2021-09-08 DIAGNOSIS — E039 Hypothyroidism, unspecified: Secondary | ICD-10-CM | POA: Diagnosis not present

## 2021-09-10 ENCOUNTER — Other Ambulatory Visit: Payer: Self-pay | Admitting: Neurology

## 2021-10-04 ENCOUNTER — Other Ambulatory Visit: Payer: Self-pay | Admitting: Physician Assistant

## 2021-10-18 DIAGNOSIS — M542 Cervicalgia: Secondary | ICD-10-CM | POA: Diagnosis not present

## 2021-10-24 ENCOUNTER — Emergency Department (HOSPITAL_COMMUNITY)
Admission: EM | Admit: 2021-10-24 | Discharge: 2021-10-25 | Discharge status: Against Medical Advice | Payer: HMO | Attending: Student | Admitting: Student

## 2021-10-24 ENCOUNTER — Encounter (HOSPITAL_COMMUNITY): Payer: Self-pay | Admitting: Emergency Medicine

## 2021-10-24 ENCOUNTER — Other Ambulatory Visit: Payer: Self-pay

## 2021-10-24 DIAGNOSIS — H5319 Other subjective visual disturbances: Secondary | ICD-10-CM | POA: Diagnosis not present

## 2021-10-24 DIAGNOSIS — Z5321 Procedure and treatment not carried out due to patient leaving prior to being seen by health care provider: Secondary | ICD-10-CM | POA: Diagnosis not present

## 2021-10-24 DIAGNOSIS — H5789 Other specified disorders of eye and adnexa: Secondary | ICD-10-CM | POA: Insufficient documentation

## 2021-10-24 MED ORDER — TETRACAINE HCL 0.5 % OP SOLN
2.0000 [drp] | Freq: Once | OPHTHALMIC | Status: DC
Start: 2021-10-24 — End: 2021-10-25

## 2021-10-24 NOTE — ED Provider Triage Note (Signed)
Emergency Medicine Provider Triage Evaluation Note  Anna Walker , a 66 y.o. female  was evaluated in triage.  Pt complains of seeing spots/floaters in the left eye for the past 2 hours.  Patient with history of optic neuritis in the right eye from years ago.  States that her left eye has much better vision in her right eye.  Denies vision acuity issues at this time but endorses floaters and spots in the left.  Patient with history of diabetes and hypertension  Review of Systems  Positive: As above Negative: As above  Physical Exam  BP 138/73   Pulse 88   Temp 98.2 F (36.8 C) (Oral)   Resp 18   SpO2 96%  Gen:   Awake, no distress   Resp:  Normal effort  MSK:   Moves extremities without difficulty  Other:  Normal fields of confrontation, normal extraocular movements  Medical Decision Making  Medically screening exam initiated at 11:26 PM.  Appropriate orders placed.  CRISTIAN GRIEVES was informed that the remainder of the evaluation will be completed by another provider, this initial triage assessment does not replace that evaluation, and the importance of remaining in the ED until their evaluation is complete.     Dorothyann Peng, PA-C 10/24/21 2336

## 2021-10-24 NOTE — ED Triage Notes (Signed)
Patient reports seeing " black dots floating" this evening , denies eye injury /no vision loss.

## 2021-10-25 ENCOUNTER — Encounter: Payer: Self-pay | Admitting: Neurology

## 2021-10-25 NOTE — ED Notes (Signed)
Pt stated that she leaving and going to her doctors office this morning.

## 2021-10-26 DIAGNOSIS — H43392 Other vitreous opacities, left eye: Secondary | ICD-10-CM | POA: Diagnosis not present

## 2021-10-26 DIAGNOSIS — H43812 Vitreous degeneration, left eye: Secondary | ICD-10-CM | POA: Diagnosis not present

## 2021-10-30 ENCOUNTER — Other Ambulatory Visit: Payer: Self-pay | Admitting: Neurology

## 2021-11-01 NOTE — Telephone Encounter (Signed)
Enough given unitl 11/23/2021, no further refills until seen by Dr. Metta Clines

## 2021-11-02 ENCOUNTER — Other Ambulatory Visit: Payer: Self-pay | Admitting: Neurology

## 2021-11-04 ENCOUNTER — Telehealth: Payer: Self-pay

## 2021-11-04 DIAGNOSIS — H25813 Combined forms of age-related cataract, bilateral: Secondary | ICD-10-CM | POA: Diagnosis not present

## 2021-11-04 DIAGNOSIS — H43392 Other vitreous opacities, left eye: Secondary | ICD-10-CM | POA: Diagnosis not present

## 2021-11-04 NOTE — Telephone Encounter (Signed)
     Patient  visit on 10/25/2021  at [facility] was for eye problems.  Have you been able to follow up with your primary care physician? Patient was not able to be treated in the ED and followed up wih her Eye doctor for treatment.  The patient was or was not able to obtain any needed medicine or equipment. N/A  Are there diet recommendations that you are having difficulty following? N/A  Patient expresses understanding of discharge instructions and education provided has no other needs at this time.    [place] Resource Care Guide   [email or web address]  ?? [phone]   Website: [email or web address]  [email or web address]

## 2021-11-18 ENCOUNTER — Ambulatory Visit: Payer: HMO | Admitting: Neurology

## 2021-11-20 ENCOUNTER — Other Ambulatory Visit: Payer: Self-pay | Admitting: Neurology

## 2021-11-22 NOTE — Progress Notes (Deleted)
NEUROLOGY FOLLOW UP OFFICE NOTE  SYMPHANI ECKSTROM [id number]  Assessment/Plan:   1.  Migraine with aura, without status migrainosus, not intractable 2.  Recurrent symptoms of right sided heaviness/numbness, likely complicated migraine.  He has history of of right optic neuritis, but based on testing that has been performed, I do not believe Ms. Vasconcelos has multiple sclerosis.  CSF analysis revealed 1 oligoclonal band, which is not a significant number.  She has had repeated MRIs over the years with minimal (3 ) tiny hyperintense foci in the periventricular white matter which have been stable.  Given her longstanding history with recurrent flare-ups, I would expect radiographic evidence of disease progression on MRI.  She also does not exhibit abnormal findings in other regions of the CNS, such as supratentorial region or spinal cord.   3.  Restless leg syndrome   1.  Migraine prevention:  Gabapentin '600mg'$  at bedtime 2.  RLS management:  Ropinirole '3mg'$  at dinner if needed and '3mg'$  at bedtime; gabapentin '600mg'$  at bedtime ('300mg'$  in evening if needed) 4.  Limit use of pain relievers to no more than 2 days out of week to prevent risk of rebound or medication-overuse headache. 5.  Keep headache diary 6.  Follow up 6 months   Subjective:  Annaleigh Steinmeyer is a 66 year old woman with ADD, fatigue, hypertension, restless leg syndrome, migraines, hyperlipidemia, type 2 diabetes mellitus, OSA, hypothyroidism and history of right optic neuritis who follows up for migraines and RLS.Marland Kitchen   UPDATE: Migraines are controlled.  She rarely has one.  [name] was effective but no covered by her insurance.    Mostly upper and lower back pain.  Seen by orthopedics.     She still has episodes of dizziness/spinning and feels shaky.  They last a few minutes and occur multiple times a week.  Occurs when looking up but can also occur while sitting in the recliner watching TV   Restless leg is controlled.  Takes  gabapentin '600mg'$  at bedtime and ropinirole '3mg'$  at dinner and at bedtime.  If needed, she takes '300mg'$  gabapentin earlier in the evening.   Current NSAIDS:  ASA '81mg'$  daily Current analgesics:  no Current triptans:  sumatriptan '100mg'$  Current anti-emetic:  no Current muscle relaxants:  no Current anti-anxiolytic:  no Current sleep aide:  no Current Antihypertensive medications:  HCTZ Current Antidepressant medications:  fluoxetine '30mg'$  Current Anticonvulsant medications:  gabapentin '300mg'$  evening PRN and '600mg'$  at bedtime Other medications:  Ropinirole '3mg'$  dinner and '3mg'$  bedtime, amantadine '100mg'$  three times daily (fatigue)    HISTORY: I RIGHT OPTIC NEURITIS AND OTHER RECURRENT SYMPTOMS WITH QUESTIONABLE MULTIPLE SCLEROSIS: She had an episode of right optic neuritis in 1998, presenting first as blurred vision and then complete vision loss.   A couple of weeks later, she developed right sided numbness of the face, arm and leg.  She underwent a lumbar puncture which demonstrated one oligoclonal band.  MRI of brain reportedly may have shown foci which may be concerning for MS.  MRI cervical spine reportedly did not demonstrate demyelinating lesions.  She was diagnosed with multiple sclerosis and treated with IV steroids.  The vision loss lasted 7-8 weeks.  The numbness lasted several days.  She was treated with DMT for several years.  She started on Betaseron but was then switched to Copaxone and then Avonex due to intolerability.  She had a repeat LP in 2002 which showed no oligoclonal bands and normal IgG index of 0.6.  Visual evoked potential in 2013  showed absent right response and normal left response.  Repeat MRI from July 2014 was thought to be essentially normal so Avonex was discontinued.  Repeat MRI in November 2014 was unchanged.  Therefore, it was believed that she did not have MS.  NMO-IgG antibodies from 02/24/14 were negative.  MRI of cervical spine without contrast from 03/24/16 was personally  reviewed and revealed cervical spondylosis but no abnormal cord signal.   Since the episode in 1998, she endorses subtle weakness on the right side of her body.  For example, when she starts to write, her handwriting will start to get worse.  She also reports short-term memory deficits since 1998.  Over the years, she has had recurrent episodes of right eye pain with right sided numbness.  In the past, they were thought to be MS flares which were treated with steroids.  They occur when she is heated or fatigued.  They typically occur at least once a year and they last about 2 days.  She last had an episode in early January 2019, for which she was evaluated in the ED on 01/30/17.  She had an MRI of the brain and orbits with and without contrast, which was personally reviewed and demonstrated asymmetrically small right optic nerve compatible with sequelae of prior optic neuritis but no enhancement, as well as three nonspecific tiny hyperintense T2/FLAIR foci in the bifrontal periventricular white matter.  All of her symptoms have not progressed over the years and recurrence of these episodes have not increased since discontinuation of Avonex.   She was seen in the ED on 08/27/18 with 1 day of bilateral blurred vision, of gradual onset. It was monocular in either eye.  She first noticed it because she had trouble driving.  She had some right eye ache and dizziness but no headache or new numbness and tingling   MRI of brain/orbits and cervical spine with and without contrast appeared stable and showed no active demyelinating lesions or abnormal signal of optic nerves.  She was diagnosed by in-house neurology with migraine and was treated with migraine cocktail of toradol and Reglan.  She doesn't have vertigo but she feels "insecure" and problems with balance when ambulating.  Blurred vision was unchanged.  She followed up with her ophthalmologist, Dr. Gershon Crane.  Eye exam was unremarkable except for early cataracts.     She has pain in the legs and gait issues.  She does have arthritis in the knees, which required surgery and had a subsequent DVT.  She is treated for restless leg syndrome, for which she takes ropinirole and gabapentin.  She has chronic burning in the hands.   She also reports chronic fatigue.  She has OSA and is on CPAP.  She takes amantadine.   II MIGRAINES She has history of migraines since early adulthood.  They are severe bifrontal pain associated with nausea, photophobia and phonophobia but no vomiting, new visual disturbance or unilateral numbness or weakness.  They last 1 to 2 days and usually occur once a year.  There are no specific triggers or relieving factors however eye gets more blurred when fatigued or with drop in blood sugar.    She also reports another migraine described as severe sharp right retro-orbital pain in September 2019.  One one occasion, pain radiated into right ear and posterior neck.  Constant but fluctuates.  There is associated blurred vision in right eye, dizziness, nausea, photophobia and phonophobia, eye twitching and fatigue.  No associated right sided numbness and  weakness.     Past medication:  topiramate  PAST MEDICAL HISTORY: Past Medical History:  Diagnosis Date   ADD (attention deficit disorder)    Anxiety    Biliary colic    Bladder leak    Diabetes mellitus without complication (HCC)    type 2 , diet controlled patient not taking any medications   Diverticular disease    DVT (deep venous thrombosis) (HCC)    left leg , resolved after treatment of blood thinners    Edema of both lower extremities    GERD (gastroesophageal reflux disease)    causes coughing    Glaucoma    Heart murmur    High cholesterol    Hypertension    Hypothyroid    Leg cramps    "in my thighs"    Leg cramps    Migraines    Multiple sclerosis ([place])    per patient " that ha sbeen ruled out , they though it was that initially but it did not preogress like it"    Optic neuritis    lesion on right optic nerve; right eye only , reduced vision and blurriness , loss of depth perception, glasses do not help when it flares     OSA (obstructive sleep apnea)    nightly cpap    Paresthesias/numbness    fingers and toes    Pneumonia 2017   Restless leg syndrome    Sleep apnea    SOB (shortness of breath)    Thyroid disease    Hypothyroidism    MEDICATIONS: Current Outpatient Medications on File Prior to Visit  Medication Sig Dispense Refill   acetaminophen (TYLENOL) 500 MG tablet Take 500 mg by mouth every 8 (eight) hours as needed for moderate pain.     acetaminophen-codeine (TYLENOL #3) 300-30 MG tablet Take 1-2 tablets by mouth every 8 (eight) hours as needed for severe pain.     amantadine (SYMMETREL) 100 MG capsule TAKE 1 CAPSULE(100 MG) BY MOUTH THREE TIMES DAILY (Patient taking differently: Take 100 mg by mouth daily.) 270 capsule 0   FLUoxetine (PROZAC) 10 MG capsule Take 10 mg by mouth at bedtime. Take with 20 mg dose     FLUoxetine (PROZAC) 20 MG capsule Take 20 mg by mouth at bedtime. Take with 10 mg dose     gabapentin (NEURONTIN) 300 MG capsule TAKE 1 CAPSULE BY MOUTH AT DINNER AND 2 CAPSULES AT BEDTIME 270 capsule 0   hydrochlorothiazide (HYDRODIURIL) 12.5 MG tablet Take 12.5 mg by mouth daily.      HYDROcodone-acetaminophen (NORCO/VICODIN) 5-325 MG tablet Take 1 tablet by mouth every 6 (six) hours as needed for moderate pain or severe pain. 20 tablet 0   HYDROcodone-acetaminophen (NORCO/VICODIN) 5-325 MG tablet Take 1-2 tablets by mouth every 4 (four) hours as needed for moderate pain ((score 4 to 6)). 30 tablet 0   levothyroxine (SYNTHROID, LEVOTHROID) 150 MCG tablet Take 150 mcg by mouth daily before breakfast.     Menthol, Topical Analgesic, (BIOFREEZE) 4 % GEL Apply 1 application. topically daily as needed (pain).     methocarbamol (ROBAXIN) 500 MG tablet Take 1 tablet (500 mg total) by mouth every 6 (six) hours as needed for muscle  spasms. 30 tablet 2   methocarbamol (ROBAXIN) 750 MG tablet Take 1 tablet (750 mg total) by mouth every 6 (six) hours as needed for muscle spasms. 60 tablet 0   pantoprazole (PROTONIX) 40 MG tablet Take 40 mg by mouth 2 (two) times daily.  rOPINIRole (REQUIP) 3 MG tablet TAKE 1 TABLET BY MOUTH AT DINNER AND AT BEDTIME AS NEEDED 44 tablet 0   simvastatin (ZOCOR) 10 MG tablet Take 10 mg by mouth daily.     SUMAtriptan (IMITREX) 100 MG tablet Take 1 tablet (100 mg total) by mouth every 2 (two) hours as needed for migraine. May repeat in 2 hours if headache persists or recurs. 10 tablet 5   No current facility-administered medications on file prior to visit.    ALLERGIES: Allergies  Allergen Reactions   Adderall [Amphetamine-Dextroamphetamine] Other (See Comments)    Caused elevated BP   Topamax [Topiramate]     dizzy    FAMILY HISTORY: Family History  Problem Relation Age of Onset   Barrett's esophagus Mother    Hypertension Mother    Obesity Mother    Berenice Primas' disease Sister    Rheum arthritis Sister    Heart attack Father 1   Sudden death Father    Coronary artery disease Neg Hx       Objective:  *** General: No acute distress.  Patient appears well-groomed.   Head:  Normocephalic/atraumatic Eyes:  Fundi examined but not visualized Neck: supple, paraspinal tenderness, full range of motion Heart:  Regular rate and rhythm Neurological Exam: ***   Metta Clines, DO  CC: Kelton Pillar, MD

## 2021-11-23 ENCOUNTER — Ambulatory Visit: Payer: HMO | Admitting: Neurology

## 2021-12-09 ENCOUNTER — Other Ambulatory Visit: Payer: Self-pay | Admitting: Neurology

## 2022-01-03 ENCOUNTER — Other Ambulatory Visit: Payer: Self-pay | Admitting: Neurology

## 2022-01-31 ENCOUNTER — Other Ambulatory Visit: Payer: Self-pay | Admitting: Family Medicine

## 2022-01-31 ENCOUNTER — Ambulatory Visit
Admission: RE | Admit: 2022-01-31 | Discharge: 2022-01-31 | Disposition: A | Discharge status: Home / Self Care | Payer: PPO | Source: Ambulatory Visit | Attending: Family Medicine | Admitting: Family Medicine

## 2022-01-31 DIAGNOSIS — R059 Cough, unspecified: Secondary | ICD-10-CM

## 2022-01-31 DIAGNOSIS — R5383 Other fatigue: Secondary | ICD-10-CM | POA: Diagnosis not present

## 2022-01-31 DIAGNOSIS — E559 Vitamin D deficiency, unspecified: Secondary | ICD-10-CM | POA: Diagnosis not present

## 2022-01-31 DIAGNOSIS — F339 Major depressive disorder, recurrent, unspecified: Secondary | ICD-10-CM | POA: Diagnosis not present

## 2022-01-31 DIAGNOSIS — E1169 Type 2 diabetes mellitus with other specified complication: Secondary | ICD-10-CM | POA: Diagnosis not present

## 2022-01-31 DIAGNOSIS — E039 Hypothyroidism, unspecified: Secondary | ICD-10-CM | POA: Diagnosis not present

## 2022-01-31 DIAGNOSIS — R0602 Shortness of breath: Secondary | ICD-10-CM | POA: Diagnosis not present

## 2022-02-03 ENCOUNTER — Encounter: Payer: Self-pay | Admitting: Neurology

## 2022-02-04 ENCOUNTER — Other Ambulatory Visit: Payer: Self-pay | Admitting: Neurology

## 2022-02-04 MED ORDER — ROPINIROLE HCL 3 MG PO TABS: ORAL_TABLET | ORAL | 3 refills | Status: DC

## 2022-02-04 NOTE — Telephone Encounter (Signed)
Refill sent.

## 2022-02-09 DIAGNOSIS — R5383 Other fatigue: Secondary | ICD-10-CM | POA: Diagnosis not present

## 2022-02-09 DIAGNOSIS — G4733 Obstructive sleep apnea (adult) (pediatric): Secondary | ICD-10-CM | POA: Diagnosis not present

## 2022-02-09 DIAGNOSIS — U099 Post covid-19 condition, unspecified: Secondary | ICD-10-CM | POA: Diagnosis not present

## 2022-02-09 DIAGNOSIS — Z6841 Body Mass Index (BMI) 40.0 and over, adult: Secondary | ICD-10-CM | POA: Diagnosis not present

## 2022-02-10 DIAGNOSIS — R059 Cough, unspecified: Secondary | ICD-10-CM | POA: Diagnosis not present

## 2022-02-14 DIAGNOSIS — Z1283 Encounter for screening for malignant neoplasm of skin: Secondary | ICD-10-CM | POA: Diagnosis not present

## 2022-02-14 DIAGNOSIS — F39 Unspecified mood [affective] disorder: Secondary | ICD-10-CM | POA: Diagnosis not present

## 2022-02-14 DIAGNOSIS — R0602 Shortness of breath: Secondary | ICD-10-CM | POA: Diagnosis not present

## 2022-02-14 DIAGNOSIS — R062 Wheezing: Secondary | ICD-10-CM | POA: Diagnosis not present

## 2022-02-14 DIAGNOSIS — E039 Hypothyroidism, unspecified: Secondary | ICD-10-CM | POA: Diagnosis not present

## 2022-02-17 ENCOUNTER — Emergency Department (HOSPITAL_COMMUNITY)
Admission: EM | Admit: 2022-02-17 | Discharge: 2022-02-17 | Disposition: A | Discharge status: Home / Self Care | Payer: PPO | Attending: Emergency Medicine | Admitting: Emergency Medicine

## 2022-02-17 ENCOUNTER — Other Ambulatory Visit: Payer: Self-pay

## 2022-02-17 ENCOUNTER — Emergency Department (HOSPITAL_COMMUNITY): Payer: PPO

## 2022-02-17 DIAGNOSIS — R079 Chest pain, unspecified: Secondary | ICD-10-CM | POA: Diagnosis not present

## 2022-02-17 DIAGNOSIS — R0602 Shortness of breath: Secondary | ICD-10-CM | POA: Insufficient documentation

## 2022-02-17 DIAGNOSIS — R0789 Other chest pain: Secondary | ICD-10-CM | POA: Insufficient documentation

## 2022-02-17 DIAGNOSIS — M549 Dorsalgia, unspecified: Secondary | ICD-10-CM | POA: Insufficient documentation

## 2022-02-17 LAB — CBC
HCT: 39 % (ref 36.0–46.0)
Hemoglobin: 12.3 g/dL (ref 12.0–15.0)
MCH: 27.5 pg (ref 26.0–34.0)
MCHC: 31.5 g/dL (ref 30.0–36.0)
MCV: 87.1 fL (ref 80.0–100.0)
Platelets: 264 10*3/uL (ref 150–400)
RBC: 4.48 MIL/uL (ref 3.87–5.11)
RDW: 14.8 % (ref 11.5–15.5)
WBC: 5.9 10*3/uL (ref 4.0–10.5)
nRBC: 0 % (ref 0.0–0.2)

## 2022-02-17 LAB — BASIC METABOLIC PANEL
Anion gap: 11 (ref 5–15)
BUN: 13 mg/dL (ref 8–23)
CO2: 23 mmol/L (ref 22–32)
Calcium: 8.7 mg/dL — ABNORMAL LOW (ref 8.9–10.3)
Chloride: 105 mmol/L (ref 98–111)
Creatinine, Ser: 0.84 mg/dL (ref 0.44–1.00)
GFR, Estimated: >60 mL/min (ref >60)
Glucose, Bld: 144 mg/dL — ABNORMAL HIGH (ref 70–99)
Potassium: 3.7 mmol/L (ref 3.5–5.1)
Sodium: 139 mmol/L (ref 135–145)

## 2022-02-17 LAB — TROPONIN I (HIGH SENSITIVITY)
Troponin I (High Sensitivity): 7 ng/L (ref <18)
Troponin I (High Sensitivity): 4 ng/L (ref <18)

## 2022-02-17 MED ORDER — BENZONATATE 100 MG PO CAPS
100.0000 mg | ORAL_CAPSULE | Freq: Once | ORAL | Status: AC
  Administered 2022-02-17: 100 mg via ORAL
  Filled 2022-02-17: qty 1

## 2022-02-17 MED ORDER — PREDNISONE 20 MG PO TABS: 40.0000 mg | ORAL_TABLET | Freq: Every day | ORAL | 0 refills | Status: DC

## 2022-02-17 MED ORDER — BENZONATATE 100 MG PO CAPS: 100.0000 mg | ORAL_CAPSULE | Freq: Three times a day (TID) | ORAL | 0 refills | Status: DC

## 2022-02-17 MED ORDER — PREDNISONE 20 MG PO TABS
60.0000 mg | ORAL_TABLET | ORAL | Status: AC
  Administered 2022-02-17: 60 mg via ORAL
  Filled 2022-02-17: qty 3

## 2022-02-17 NOTE — ED Provider Notes (Signed)
[facility] Provider Note   CSN: [id number] Arrival date & time: 02/17/22  8563     History  Chief Complaint  Patient presents with   Chest Pain   Shortness of Breath   Back Pain    Anna Walker is a 67 y.o. female.  HPI Patient presents in the context of recent COVID infection with ongoing chest pain, shortness of breath and an exacerbation that occurred earlier today.  She notes that she has been seeing her physician, and is being referred to pulmonology has had CT scan recently.  In spite of all of this, and recent course of steroids that she continues to have dyspnea, fatigue, chest discomfort.  Today the pain began to become more severe without obvious precipitant, and since that time pain has been persistent, nonradiating, pressure-like, anterior.    Home Medications Prior to Admission medications   Medication Sig Start Date End Date Taking? Authorizing Provider  acetaminophen (TYLENOL) 500 MG tablet Take 500 mg by mouth every 8 (eight) hours as needed for moderate pain.   Yes [provider]  albuterol (VENTOLIN HFA) 108 (90 Base) MCG/ACT inhaler Inhale 1 puff into the lungs every 4 (four) hours as needed. 02/14/22  Yes [provider]  amantadine (SYMMETREL) 100 MG capsule TAKE 1 CAPSULE(100 MG) BY MOUTH THREE TIMES DAILY Patient taking differently: Take 200 mg by mouth daily. 05/24/21  Yes Jaffe, Adam R, DO  benzonatate (TESSALON) 100 MG capsule Take 1 capsule (100 mg total) by mouth every 8 (eight) hours. 02/17/22  Yes Carmin Muskrat, MD  FLUoxetine (PROZAC) 20 MG capsule Take 40 mg by mouth at bedtime. 06/21/18  Yes [provider]  gabapentin (NEURONTIN) 300 MG capsule TAKE 1 CAPSULE BY MOUTH AT DINNER AND 2 CAPSULES AT BEDTIME Patient taking differently: Take 600 mg by mouth at bedtime. 09/10/21  Yes Jaffe, Adam R, DO  hydrochlorothiazide (HYDRODIURIL) 12.5 MG tablet Take 12.5 mg by mouth daily.   02/06/17  Yes [provider]  levothyroxine (SYNTHROID) 137 MCG tablet Take 137 mcg by mouth daily before breakfast.   Yes [provider]  Menthol, Topical Analgesic, (BIOFREEZE) 4 % GEL Apply 1 application. topically daily as needed (pain).   Yes [provider]  predniSONE (DELTASONE) 20 MG tablet Take 2 tablets (40 mg total) by mouth daily with breakfast. For the next four days 02/17/22  Yes Carmin Muskrat, MD  promethazine-dextromethorphan (PROMETHAZINE-DM) 6.25-15 MG/5ML syrup Take 5 mLs by mouth every 6 (six) hours as needed for cough. 01/04/22  Yes [provider]  rOPINIRole (REQUIP) 3 MG tablet TAKE 1 TABLET BY MOUTH AT DINNER AND AT BEDTIME AS NEEDED Patient taking differently: Take 3 mg by mouth at bedtime. 02/04/22  Yes Jaffe, Adam R, DO  simvastatin (ZOCOR) 10 MG tablet Take 10 mg by mouth daily.   Yes [provider]  SUMAtriptan (IMITREX) 100 MG tablet Take 1 tablet (100 mg total) by mouth every 2 (two) hours as needed for migraine. May repeat in 2 hours if headache persists or recurs. 07/07/20  Yes Jaffe, Adam R, DO  Vitamin D, Ergocalciferol, (DRISDOL) 1.25 MG (50000 UNIT) CAPS capsule Take 50,000 Units by mouth 2 (two) times a week. Harrell Lark   Yes [provider]      Allergies    Adderall [amphetamine-dextroamphetamine] and Topamax [topiramate]    Review of Systems   Review of Systems  All other systems reviewed and are negative.  Physical Exam Updated Vital Signs BP (!) 148/75   Pulse 71   Temp (!) 97.5 F (36.4 C)   Resp 18   Ht '5\' 6"'$  (1.676 m)   Wt 122 kg   SpO2 99%   BMI 43.42 kg/m  Physical Exam Vitals and nursing note reviewed.  Constitutional:      General: She is not in acute distress.    Appearance: She is well-developed.  HENT:     Head: Normocephalic and atraumatic.  Eyes:     Conjunctiva/sclera: Conjunctivae normal.  Cardiovascular:     Rate and Rhythm: Normal rate and regular rhythm.   Pulmonary:     Effort: Pulmonary effort is normal. No respiratory distress.     Breath sounds: Normal breath sounds. No stridor.  Abdominal:     General: There is no distension.  Skin:    General: Skin is warm and dry.  Neurological:     Mental Status: She is alert and oriented to person, place, and time.     Cranial Nerves: No cranial nerve deficit.  Psychiatric:        Mood and Affect: Mood normal.     ED Results / Procedures / Treatments   Labs (all labs ordered are listed, but only abnormal results are displayed) Labs Reviewed  BASIC METABOLIC PANEL - Abnormal; Notable for the following components:      Result Value   Glucose, Bld 144 (*)    Calcium 8.7 (*)    All other components within normal limits  CBC  TROPONIN I (HIGH SENSITIVITY)  TROPONIN I (HIGH SENSITIVITY)    EKG EKG Interpretation  Date/Time:  Thursday February 17 2022 08:59:44 EST Ventricular Rate:  94 PR Interval:  130 QRS Duration: 78 QT Interval:  348 QTC Calculation: 435 R Axis:   50 Text Interpretation: Normal sinus rhythm Cannot rule out Anterior infarct , age undetermined Abnormal ECG Confirmed by Carmin Muskrat [phone]) on 02/17/2022 1:15:40 PM  Radiology DG Chest 2 View  Result Date: 02/17/2022 CLINICAL DATA:  Chest pain. EXAM: CHEST - 2 VIEW COMPARISON:  01/31/2022. FINDINGS: Clear lungs. Normal heart size and mediastinal contours. No pleural effusion or pneumothorax. Visualized bones and upper abdomen are unremarkable. IMPRESSION: No evidence of acute cardiopulmonary disease. Electronically Signed   By: Emmit Alexanders M.D.   On: 02/17/2022 09:25    Procedures Procedures    Medications Ordered in ED Medications  benzonatate (TESSALON) capsule 100 mg (has no administration in time range)  predniSONE (DELTASONE) tablet 60 mg (has no administration in time range)    ED Course/ Medical Decision Making/ A&P                             Medical Decision Making Patient with a history of  recent COVID infection presents with chest pain, dyspnea.  Patient is awake, alert, in no distress, speaking clearly.  Differential including long COVID, pneumonia, bronchitis, PE considered. The patient does have risk factor of recent COVID, her risk profile is otherwise negligible for PE, she is not hypoxic, tachypneic, tachycardic.  Reportedly the patient had CT scan performed at another hospital which was reassuring.  This was seemingly not CT angiography.  Amount and/or Complexity of Data Reviewed External Data Reviewed: notes.    Details: Primary care notes from 2 days ago ongoing efforts for dyspnea control Labs: ordered. Decision-making details documented in ED Course. Radiology: ordered and independent interpretation performed. Decision-making details documented in  ED Course. ECG/medicine tests: ordered and independent interpretation performed. Decision-making details documented in ED Course.  Risk OTC drugs. Prescription drug management. Decision regarding hospitalization.   2:52 PM Exam the patient remains in no distress.  I reviewed labs, prior studies including echocardiogram from 2017 with her and her daughter at bedside. Patient continues to exhibit no increased work of breathing, no tachypnea, tachycardia, hypoxia.  Suspicion for persistent symptoms secondary to COVID.  Risk profile for PE is negligible, aside from recent COVID, but physical exam is not consistent with this.  No lab evidence today for coronary ischemia with 2 normal troponin, nonischemic EKG.  No x-ray evidence for pneumonia.  Some suspicion, as above for persistent COVID symptoms, and the hospitalization is a consideration given her subjective dyspnea, patient is appropriate for discharge with close outpatient follow-up with primary care and pulmonology.         Final Clinical Impression(s) / ED Diagnoses Final diagnoses:  Shortness of breath    Rx / DC Orders ED Discharge Orders          Ordered     predniSONE (DELTASONE) 20 MG tablet  Daily with breakfast        02/17/22 1452    benzonatate (TESSALON) 100 MG capsule  Every 8 hours        02/17/22 1452              Carmin Muskrat, MD 02/17/22 1452

## 2022-02-17 NOTE — Discharge Instructions (Addendum)
As discussed, today's evaluation has been generally reassuring.  However, with your ongoing discomfort and as of breath following COVID infection it is very important to follow-up with both primary care and pulmonology.  Do not hesitate to return here for additional evaluation.  In addition to the prescribed steroids, and benzonatate, please use your albuterol every 4 hours for the next few days for additional symptom relief.

## 2022-02-17 NOTE — ED Provider Triage Note (Signed)
Emergency Medicine Provider Triage Evaluation Note  Anna Walker , a 67 y.o. female  was evaluated in triage.  Pt complains of dyspnea, chest pressure.  Patient had COVID end of last year, since that time has been dealing with difficulty with breathing, fatigue, occasional pain.  However, today, about 45 minutes ago the patient developed sudden worsening of her pain and dyspnea.  She notes that since her COVID diagnosis she has been working with pulmonology had a CT scan as recently as last week due to ongoing dyspnea.  She reports results were generally reassuring.  Review of Systems  Positive: Chest pain, dyspnea Negative: Syncope, fever  Physical Exam  BP (!) 166/95 (BP Location: Right Arm)   Pulse 96   Temp 98.3 F (36.8 C) (Oral)   Resp 18   Ht '5\' 6"'$  (1.676 m)   Wt 122 kg   SpO2 96%   BMI 43.42 kg/m  Gen:   Awake, no distress speaking clearly Resp:  Normal effort no audible wheezing MSK:   Moves extremities without difficulty no deformity, no obvious edema Other:  Neuro unremarkable  Medical Decision Making  Medically screening exam initiated at 9:17 AM.  Appropriate orders placed.  SUMI LYE was informed that the remainder of the evaluation will be completed by another provider, this initial triage assessment does not replace that evaluation, and the importance of remaining in the ED until their evaluation is complete.   Carmin Muskrat, MD 02/17/22 [phone]

## 2022-02-17 NOTE — ED Triage Notes (Signed)
Pt. Stated, Im having chest pain, SOB and back pain that started 30 min ago. I had OCVID in December and have had symptoms like this ever since.

## 2022-02-23 ENCOUNTER — Ambulatory Visit (INDEPENDENT_AMBULATORY_CARE_PROVIDER_SITE_OTHER): Payer: PPO | Admitting: Pulmonary Disease

## 2022-02-23 ENCOUNTER — Encounter: Payer: Self-pay | Admitting: Pulmonary Disease

## 2022-02-23 VITALS — BP 126/84 | HR 77 | Ht 66.0 in | Wt 272.0 lb

## 2022-02-23 DIAGNOSIS — R0602 Shortness of breath: Secondary | ICD-10-CM | POA: Diagnosis not present

## 2022-02-23 DIAGNOSIS — J45998 Other asthma: Secondary | ICD-10-CM | POA: Diagnosis not present

## 2022-02-23 LAB — D-DIMER, QUANTITATIVE: D-Dimer, Quant: 0.39 mcg/mL FEU (ref <0.50)

## 2022-02-23 MED ORDER — FLUTICASONE FUROATE-VILANTEROL 100-25 MCG/ACT IN AEPB: 1.0000 | INHALATION_SPRAY | Freq: Every day | RESPIRATORY_TRACT | 2 refills | Status: DC

## 2022-02-23 NOTE — Patient Instructions (Addendum)
I am concerned you have post-viral reactive airways disease after having covid infection  Start breo ellipta 1 puff daily - rinse mouth out after each use  Continue albuterol inhaler 1-2 puffs twice daily  We will check a d-dimer lab today to rule out concern for blood clots. If it is elevated we will order a CT Chest scan with contrast to rule out pulmonary emboli.   Follow up in 2 months

## 2022-02-23 NOTE — Progress Notes (Unsigned)
Synopsis: Referred in January 2024 for shortness of breath by Kelton Pillar, MD  Subjective:   PATIENT ID: Anna Walker GENDER: female DOB: Jul 22, 1955, MRN: [id number]  HPI  Chief Complaint  Patient presents with   Consult    Referred for increased SOB and chest pressure for the past 6 weeks. States she was diagnosed with COVID back in December. Has a productive cough with clear phlegm.    Anna Walker is a 67 year old woman, former smoker with DMII, GERD, DVT, HTN, MS and OSA who is referred to pulmonary clinic for shortness of breath.   She had covid 19 infection in December. She was treated with antiviral.  She has cough wheezing and dyspnea, wakes her up at night The albuterol does help with her symptoms.   Lil clear mucous, no blood.  No fevers and chills.  Swelling comes and goes in the legs.   She had DVT after a meniscus repair.  She had cervical fusion in June She has history of GERD, takes mylanta as needed   Her father died from a blood clot, 49.  Her mother has recurrent bronchitis.   She quit smoking in 1986. She smoked for 10 years about half a pack per day.   Past Medical History:  Diagnosis Date   ADD (attention deficit disorder)    Anxiety    Biliary colic    Bladder leak    Diabetes mellitus without complication (HCC)    type 2 , diet controlled patient not taking any medications   Diverticular disease    DVT (deep venous thrombosis) (HCC)    left leg , resolved after treatment of blood thinners    Edema of both lower extremities    GERD (gastroesophageal reflux disease)    causes coughing    Glaucoma    Heart murmur    High cholesterol    Hypertension    Hypothyroid    Leg cramps    "in my thighs"    Leg cramps    Migraines    Multiple sclerosis ([place])    per patient " that ha sbeen ruled out , they though it was that initially but it did not preogress like it"   Optic neuritis    lesion on right optic nerve; right eye only ,  reduced vision and blurriness , loss of depth perception, glasses do not help when it flares     OSA (obstructive sleep apnea)    nightly cpap    Paresthesias/numbness    fingers and toes    Pneumonia 2017   Restless leg syndrome    Sleep apnea    SOB (shortness of breath)    Thyroid disease    Hypothyroidism     Family History  Problem Relation Age of Onset   Barrett's esophagus Mother    Hypertension Mother    Obesity Mother    Berenice Primas' disease Sister    Rheum arthritis Sister    Heart attack Father 71   Sudden death Father    Coronary artery disease Neg Hx      Social History   Socioeconomic History   Marital status: Widowed    Spouse name: Not on file   Number of children: 2   Years of education: Not on file   Highest education level: Some college, no degree  Occupational History   Occupation: disabled  Tobacco Use   Smoking status: Former    Types: Cigarettes    Quit date: 01/25/1984  Years since quitting: 38.1   Smokeless tobacco: Never  Vaping Use   Vaping Use: Never used  Substance and Sexual Activity   Alcohol use: Not Currently   Drug use: No   Sexual activity: Not Currently    Birth control/protection: Surgical  Other Topics Concern   Not on file  Social History Narrative   Pt is right handed. She is married, lives with her husband in a 1 story house, ramp to enter. She drinks 2 cups of coffee a day.       Husband has passed away on 12/18/2020   Social Determinants of Health   Financial Resource Strain: Not on file  Food Insecurity: Not on file  Transportation Needs: Not on file  Physical Activity: Not on file  Stress: Not on file  Social Connections: Not on file  Intimate Partner Violence: Not on file     Allergies  Allergen Reactions   Adderall [Amphetamine-Dextroamphetamine] Other (See Comments)    Caused elevated BP   Topamax [Topiramate]     dizzy     Outpatient Medications Prior to Visit  Medication Sig Dispense Refill    albuterol (VENTOLIN HFA) 108 (90 Base) MCG/ACT inhaler Inhale 1 puff into the lungs every 4 (four) hours as needed.     amantadine (SYMMETREL) 100 MG capsule TAKE 1 CAPSULE(100 MG) BY MOUTH THREE TIMES DAILY (Patient taking differently: Take 200 mg by mouth daily.) 270 capsule 0   benzonatate (TESSALON) 100 MG capsule Take 1 capsule (100 mg total) by mouth every 8 (eight) hours. 21 capsule 0   FLUoxetine (PROZAC) 20 MG capsule Take 40 mg by mouth at bedtime.     gabapentin (NEURONTIN) 300 MG capsule TAKE 1 CAPSULE BY MOUTH AT DINNER AND 2 CAPSULES AT BEDTIME (Patient taking differently: Take 600 mg by mouth at bedtime.) 270 capsule 0   hydrochlorothiazide (HYDRODIURIL) 12.5 MG tablet Take 12.5 mg by mouth daily.      levothyroxine (SYNTHROID) 137 MCG tablet Take 137 mcg by mouth daily before breakfast.     Menthol, Topical Analgesic, (BIOFREEZE) 4 % GEL Apply 1 application. topically daily as needed (pain).     rOPINIRole (REQUIP) 3 MG tablet TAKE 1 TABLET BY MOUTH AT [facility] AND AT BEDTIME AS NEEDED (Patient taking differently: Take 3 mg by mouth at bedtime.) 60 tablet 3   simvastatin (ZOCOR) 10 MG tablet Take 10 mg by mouth daily.     Vitamin D, Ergocalciferol, (DRISDOL) 1.25 MG (50000 UNIT) CAPS capsule Take 50,000 Units by mouth 2 (two) times a week. Tues, Thur     acetaminophen (TYLENOL) 500 MG tablet Take 500 mg by mouth every 8 (eight) hours as needed for moderate pain.     predniSONE (DELTASONE) 20 MG tablet Take 2 tablets (40 mg total) by mouth daily with breakfast. For the next four days 8 tablet 0   promethazine-dextromethorphan (PROMETHAZINE-DM) 6.25-15 MG/5ML syrup Take 5 mLs by mouth every 6 (six) hours as needed for cough.     SUMAtriptan (IMITREX) 100 MG tablet Take 1 tablet (100 mg total) by mouth every 2 (two) hours as needed for migraine. May repeat in 2 hours if headache persists or recurs. 10 tablet 5   No facility-administered medications prior to visit.    Review of Systems   Constitutional:  Negative for chills, fever, malaise/fatigue and weight loss.  HENT:  Positive for sore throat. Negative for congestion and sinus pain.   Eyes: Negative.   Respiratory:  Positive for cough and  shortness of breath. Negative for hemoptysis, sputum production and wheezing.   Cardiovascular:  Positive for chest pain. Negative for palpitations, orthopnea, claudication and leg swelling.  Gastrointestinal:  Negative for abdominal pain, heartburn, nausea and vomiting.  Genitourinary: Negative.   Musculoskeletal:  Positive for joint pain. Negative for myalgias.  Skin:  Negative for rash.  Neurological:  Negative for weakness.  Endo/Heme/Allergies: Negative.   Psychiatric/Behavioral:  The patient is nervous/anxious.    Objective:   Vitals:   02/23/22 0938  BP: 126/84  Pulse: 77  SpO2: 98%  Weight: 272 lb (123.4 kg)  Height: '5\' 6"'$  (1.676 m)   Physical Exam Constitutional:      General: She is not in acute distress.    Appearance: She is not ill-appearing.  HENT:     Head: Normocephalic and atraumatic.  Eyes:     General: No scleral icterus.    Conjunctiva/sclera: Conjunctivae normal.     Pupils: Pupils are equal, round, and reactive to light.  Cardiovascular:     Rate and Rhythm: Normal rate and regular rhythm.     Pulses: Normal pulses.     Heart sounds: Normal heart sounds. No murmur heard. Pulmonary:     Effort: Pulmonary effort is normal.     Breath sounds: Normal breath sounds. No wheezing, rhonchi or rales.  Abdominal:     General: Bowel sounds are normal.     Palpations: Abdomen is soft.  Musculoskeletal:     Right lower leg: No edema.     Left lower leg: No edema.  Lymphadenopathy:     Cervical: No cervical adenopathy.  Skin:    General: Skin is warm and dry.  Neurological:     General: No focal deficit present.     Mental Status: She is alert.  Psychiatric:        Mood and Affect: Mood normal.        Behavior: Behavior normal.        Thought  Content: Thought content normal.        Judgment: Judgment normal.    CBC    Component Value Date/Time   WBC 5.9 02/17/2022 0902   RBC 4.48 02/17/2022 0902   HGB 12.3 02/17/2022 0902   HCT 39.0 02/17/2022 0902   PLT 264 02/17/2022 0902   MCV 87.1 02/17/2022 0902   MCH 27.5 02/17/2022 0902   MCHC 31.5 02/17/2022 0902   RDW 14.8 02/17/2022 0902   LYMPHSABS 1.5 08/20/2020 1510   MONOABS 0.5 08/20/2020 1510   EOSABS 0.0 08/20/2020 1510   BASOSABS 0.0 08/20/2020 1510     Chest imaging: CT Chest 02/10/22 - Lower Neck: Unremarkable.   - Heart and Vessels: Heart size is normal without pericardial effusion. Few aortic valve calcifications. Few possible small coronary calcifications. No thoracic aortic aneurysm. Main pulmonary artery is normal in caliber.   - Mediastinum, Hila and Axilla: Mild shotty mediastinal lymph nodes, none pathologically enlarged. No axillary adenopathy.   - Lungs: Central airways are patent. Few pulmonary micronodules.   - Pleura: No pleural effusion or pneumothorax.   - Upper Abdomen: Unremarkable.   - Musculoskeletal: No acute fracture or aggressive osseous lesion. Mild to moderate spondylosis. Cervical ACDF. No acute soft tissue finding.   - Miscellaneous: N/A.   PFT:     No data to display          Labs:  Path:  Echo:  Heart Catheterization:       Assessment & Plan:   Post-viral reactive  airway disease - Plan: fluticasone furoate-vilanterol (BREO ELLIPTA) 100-25 MCG/ACT AEPB  Shortness of breath - Plan: D-Dimer, Quantitative  Discussion:  We will check echo in the future if swelling continues.    Current Outpatient Medications:    albuterol (VENTOLIN HFA) 108 (90 Base) MCG/ACT inhaler, Inhale 1 puff into the lungs every 4 (four) hours as needed., Disp: , Rfl:    amantadine (SYMMETREL) 100 MG capsule, TAKE 1 CAPSULE(100 MG) BY MOUTH THREE TIMES DAILY (Patient taking differently: Take 200 mg by mouth daily.), Disp: 270 capsule,  Rfl: 0   benzonatate (TESSALON) 100 MG capsule, Take 1 capsule (100 mg total) by mouth every 8 (eight) hours., Disp: 21 capsule, Rfl: 0   FLUoxetine (PROZAC) 20 MG capsule, Take 40 mg by mouth at bedtime., Disp: , Rfl:    fluticasone furoate-vilanterol (BREO ELLIPTA) 100-25 MCG/ACT AEPB, Inhale 1 puff into the lungs daily., Disp: 60 each, Rfl: 2   gabapentin (NEURONTIN) 300 MG capsule, TAKE 1 CAPSULE BY MOUTH AT DINNER AND 2 CAPSULES AT BEDTIME (Patient taking differently: Take 600 mg by mouth at bedtime.), Disp: 270 capsule, Rfl: 0   hydrochlorothiazide (HYDRODIURIL) 12.5 MG tablet, Take 12.5 mg by mouth daily. , Disp: , Rfl:    levothyroxine (SYNTHROID) 137 MCG tablet, Take 137 mcg by mouth daily before breakfast., Disp: , Rfl:    Menthol, Topical Analgesic, (BIOFREEZE) 4 % GEL, Apply 1 application. topically daily as needed (pain)., Disp: , Rfl:    rOPINIRole (REQUIP) 3 MG tablet, TAKE 1 TABLET BY MOUTH AT [facility] AND AT BEDTIME AS NEEDED (Patient taking differently: Take 3 mg by mouth at bedtime.), Disp: 60 tablet, Rfl: 3   simvastatin (ZOCOR) 10 MG tablet, Take 10 mg by mouth daily., Disp: , Rfl:    Vitamin D, Ergocalciferol, (DRISDOL) 1.25 MG (50000 UNIT) CAPS capsule, Take 50,000 Units by mouth 2 (two) times a week. Tues, Thur, Disp: , Rfl:

## 2022-02-24 ENCOUNTER — Encounter: Payer: Self-pay | Admitting: Pulmonary Disease

## 2022-03-03 ENCOUNTER — Institutional Professional Consult (permissible substitution): Payer: PPO | Admitting: Pulmonary Disease

## 2022-03-07 ENCOUNTER — Institutional Professional Consult (permissible substitution): Payer: PPO | Admitting: Pulmonary Disease

## 2022-03-10 IMAGING — CT CT HEAD W/O CM
4 series · 16 of 47 positions shown, 18 images · non-contrast
Comparison: 07/25/2009

CLINICAL DATA: Headache

EXAM:
CT HEAD WITHOUT CONTRAST
TECHNIQUE: Contiguous axial images were obtained from the base of the skull
through the vertex without intravenous contrast.

[Series 3: head wo · axial · 0.45mm/px · z∈[+1336,+1456]mm · 7 of 34 slices shown, 9 images]
[im 5/34  brain]
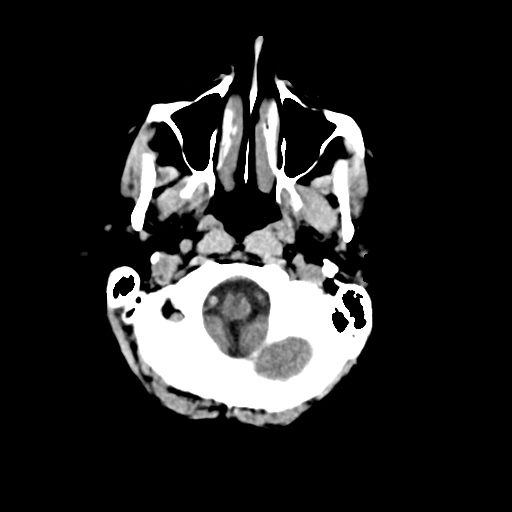
[im 5/34  bone]
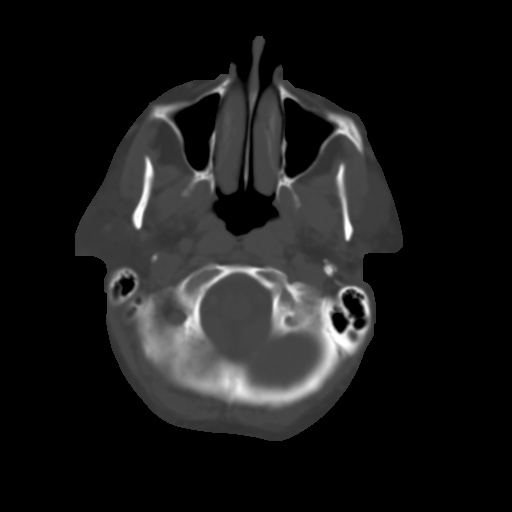
[im 9/34  brain]
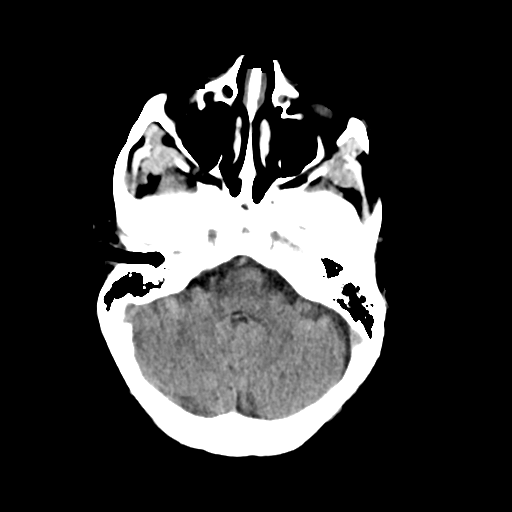
[im 13/34  brain]
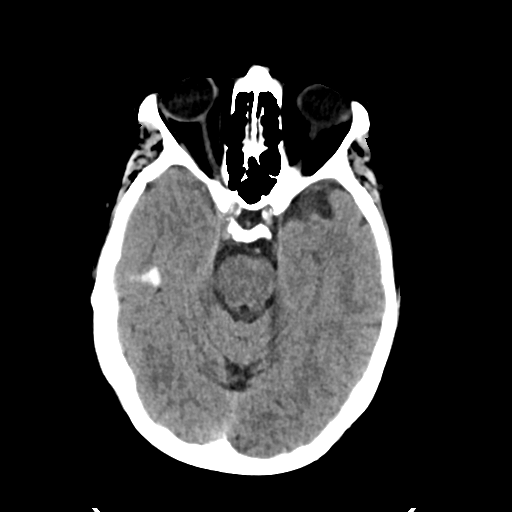
[im 17/34  brain]
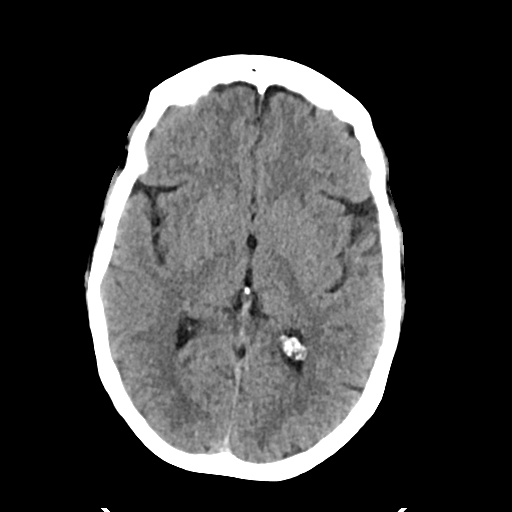
[im 21/34  brain]
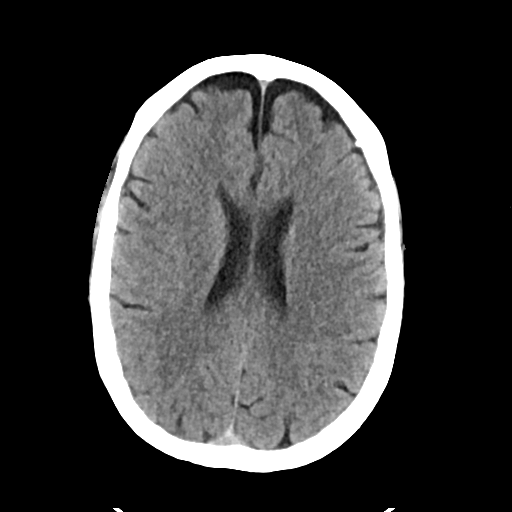
[im 21/34  bone]
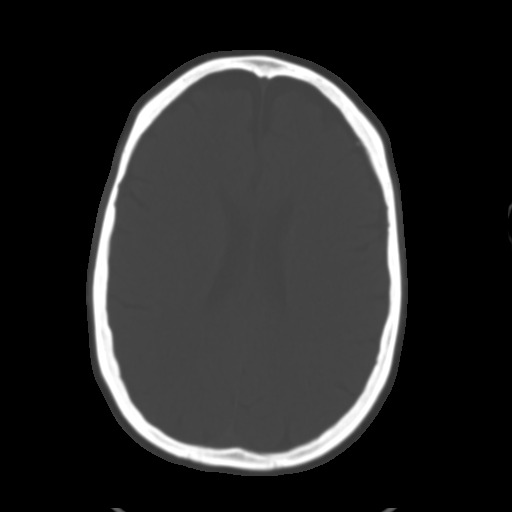
[im 25/34  brain]
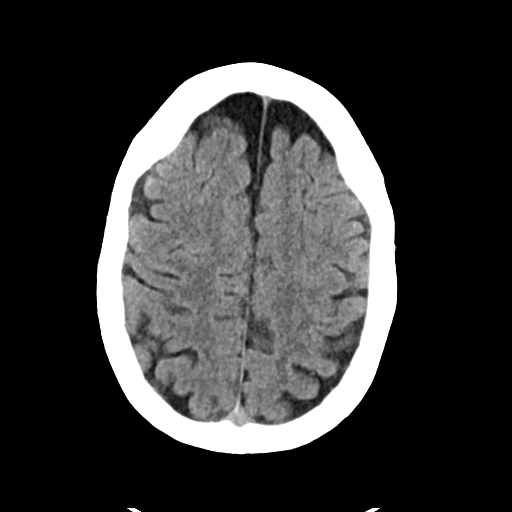
[im 29/34  brain]
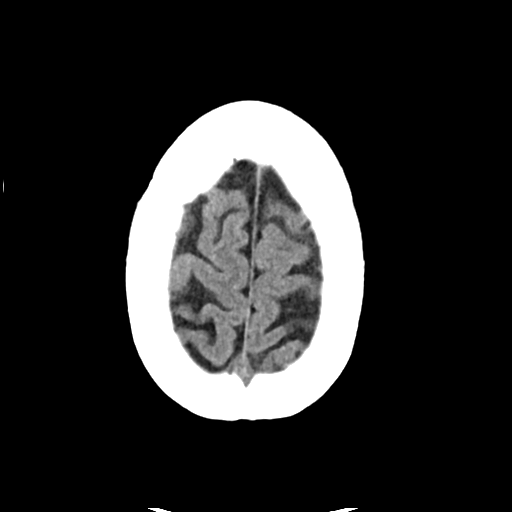

[Series 4: head bone · axial · 0.45mm/px · z∈[+1332,+1366]mm · 3 of 85 slices shown]
[im 9/85  bone]
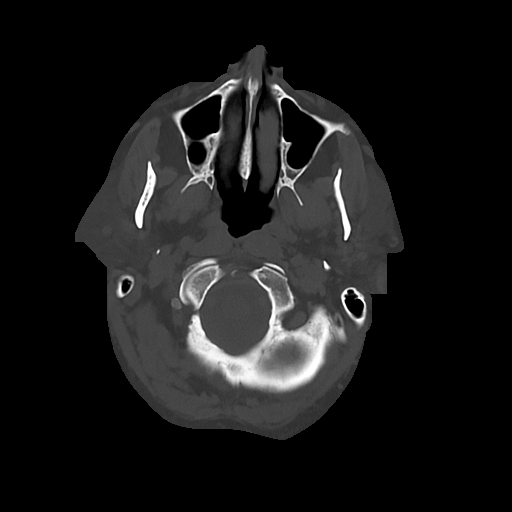
[im 17/85  bone]
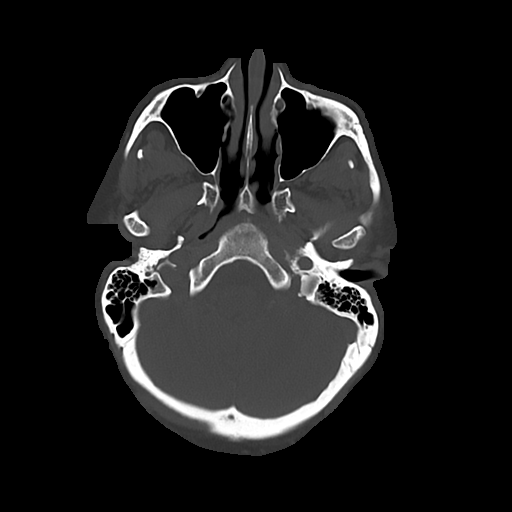
[im 26/85  bone]
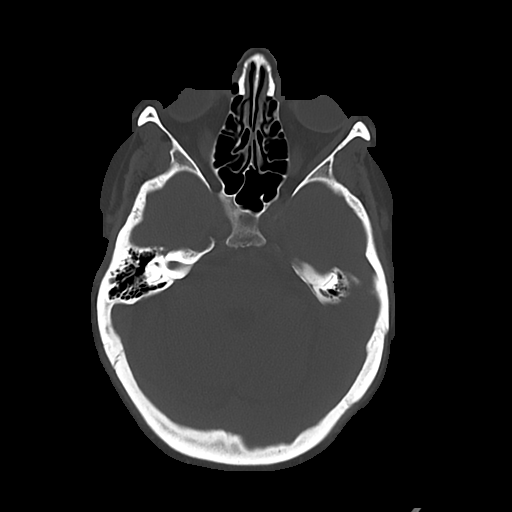

[Series 5: cor soft · coronal · 0.36mm/px · 3 of 74 slices shown]
[im 25/74  brain]
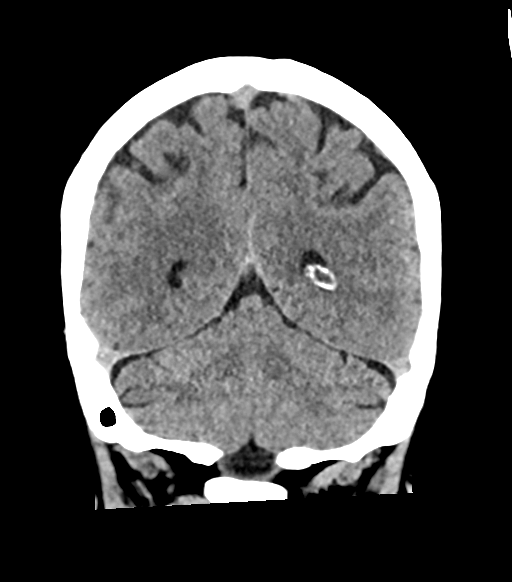
[im 33/74  brain]
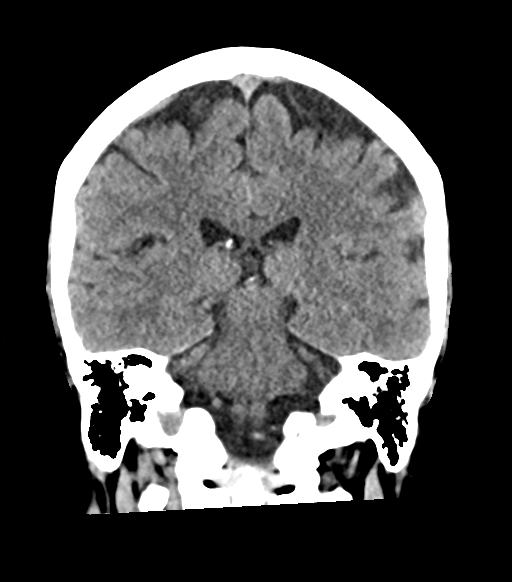
[im 41/74  brain]
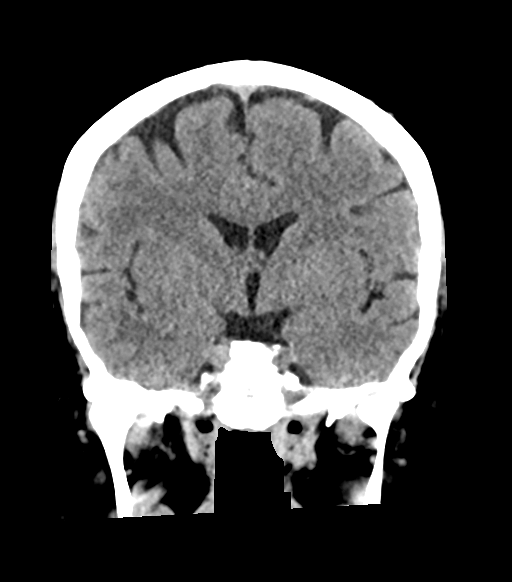

[Series 6: sag soft · sagittal · 0.38mm/px · 3 of 61 slices shown]
[im 21/61  brain]
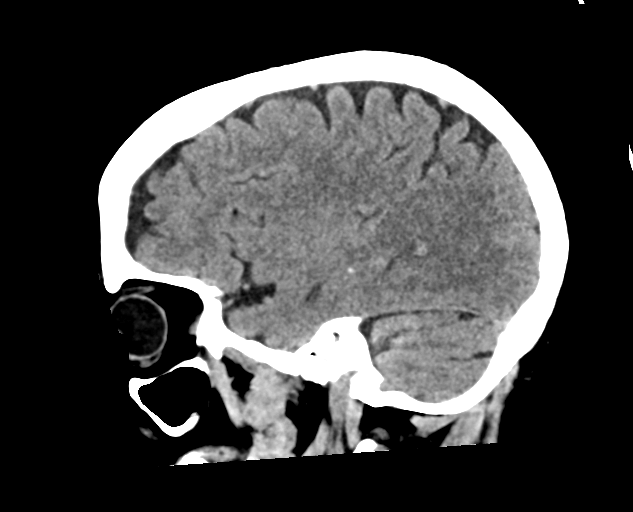
[im 31/61  brain]
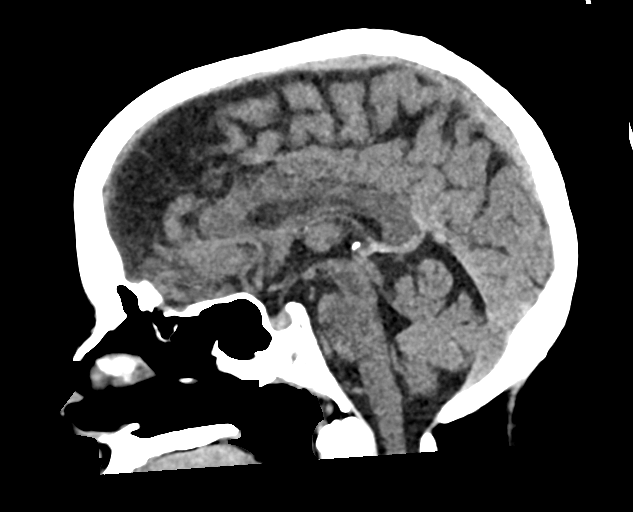
[im 41/61  brain]
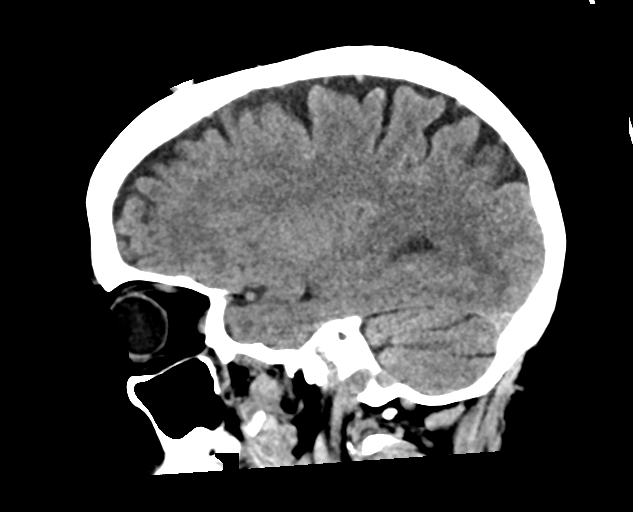

[16 of 47 positions shown; findings below may reference images not displayed]

FINDINGS: Brain: There is no mass, hemorrhage or extra-axial collection. The
size and configuration of the ventricles and extra-axial CSF spaces
are normal. The brain parenchyma is normal, without acute or chronic
infarction.

Vascular: No abnormal hyperdensity of the major intracranial
arteries or dural venous sinuses. No intracranial atherosclerosis.

Skull: The visualized skull base, calvarium and extracranial soft
tissues are normal.

Sinuses/Orbits: No fluid levels or advanced mucosal thickening of
the visualized paranasal sinuses. No mastoid or middle ear effusion.
The orbits are normal.
IMPRESSION: Normal head CT.

## 2022-03-10 NOTE — Progress Notes (Deleted)
NEUROLOGY FOLLOW UP OFFICE NOTE  Anna Walker [id number]  Assessment/Plan:   1.  Migraine with aura, without status migrainosus, not intractable 2.  Recurrent symptoms of right-sided heaviness/numbness, likely complicated migraine.  He has history of of right optic neuritis, but based on testing that has been performed, I do not believe Anna Walker has multiple sclerosis.  CSF analysis revealed 1 oligoclonal band, which is not a significant number.  Anna Walker has had repeated MRIs over the years with minimal (3 ) tiny hyperintense foci in the periventricular white matter which have been stable.  Given her longstanding history with recurrent flare-ups, I would expect radiographic evidence of disease progression on MRI.  Anna Walker also does not exhibit abnormal findings in other regions of the CNS, such as supratentorial region or spinal cord.   3.  Restless leg syndrome   1.  Migraine prevention:  Gabapentin 660m at bedtime 2.  RLS management:  Ropinirole 324mat dinner if needed and 26m27mt bedtime; gabapentin 600m31m bedtime (300mg12mevening if needed) 4.  Limit use of pain relievers to no more than 2 days out of week to prevent risk of rebound or medication-overuse headache. 5.  Keep headache diary 6.  Follow up ***   Subjective:  Anna Walker 66 ye49 old woman with ADD, fatigue, hypertension, restless leg syndrome, migraines, hyperlipidemia, type 2 diabetes mellitus, OSA, hypothyroidism and history of right optic neuritis who follows up for migraines and RLS..   UMarland KitchenDATE: Migraines are controlled.  Anna Walker rarely has one.  UbrelRoselyn Meiereffective but no covered by her insurance.    Restless leg is controlled.  Takes gabapentin 600mg 59medtime and ropinirole 26mg at426mnner and at bedtime.  If needed, Anna Walker takes 300mg ga29mntin earlier in the evening.   Current NSAIDS:  ASA 81mg dai11murrent analgesics:  no Current triptans:  sumatriptan 100mg Curr36manti-emetic:  no Current muscle  relaxants:  no Current anti-anxiolytic:  no Current sleep aide:  no Current Antihypertensive medications:  HCTZ Current Antidepressant medications:  fluoxetine 30mg Curre126mnticonvulsant medications:  gabapentin 300mg evenin36mN and 600mg at bedt8mOther medications:  Ropinirole 26mg dinner an30mmg bedtime, a626mtadine 100mg three time52mily (fatigue)    HISTORY: I RIGHT OPTIC NEURITIS AND OTHER RECURRENT SYMPTOMS WITH QUESTIONABLE MULTIPLE SCLEROSIS: Anna Walker had an episode of right optic neuritis in 1998, presenting first as blurred vision and then complete vision loss.   A couple of weeks later, Anna Walker developed right sided numbness of the face, arm and leg.  Anna Walker underwent a lumbar puncture which demonstrated one oligoclonal band.  MRI of brain reportedly may have shown foci which may be concerning for MS.  MRI cervical spine reportedly did not demonstrate demyelinating lesions.  Anna Walker was diagnosed with multiple sclerosis and treated with IV steroids.  The vision loss lasted 7-8 weeks.  The numbness lasted several days.  Anna Walker was treated with DMT for several years.  Anna Walker started on Betaseron but was then switched to Copaxone and then Avonex due to intolerability.  Anna Walker had a repeat LP in 2002 which showed no oligoclonal bands and normal IgG index of 0.6.  Visual evoked potential in 2013 showed absent right response and normal left response.  Repeat MRI from July 2014 was thought to be essentially normal so Avonex was discontinued.  Repeat MRI in November 2014 was unchanged.  Therefore, it was believed that Anna Walker did not have MS.  NMO-IgG antibodies from 02/24/14 were negative.  MRI of cervical spine  without contrast from 03/24/16 was personally reviewed and revealed cervical spondylosis but no abnormal cord signal.   Since the episode in 1998, Anna Walker endorses subtle weakness on the right side of her body.  For example, when Anna Walker starts to write, her handwriting will start to get worse.  Anna Walker also reports short-term memory  deficits since 1998.  Over the years, Anna Walker has had recurrent episodes of right eye pain with right sided numbness.  In the past, they were thought to be MS flares which were treated with steroids.  They occur when Anna Walker is heated or fatigued.  They typically occur at least once a year and they last about 2 days.  Anna Walker last had an episode in early January 2019, for which Anna Walker was evaluated in the ED on 01/30/17.  Anna Walker had an MRI of the brain and orbits with and without contrast, which was personally reviewed and demonstrated asymmetrically small right optic nerve compatible with sequelae of prior optic neuritis but no enhancement, as well as three nonspecific tiny hyperintense T2/FLAIR foci in the bifrontal periventricular white matter.  All of her symptoms have not progressed over the years and recurrence of these episodes have not increased since discontinuation of Avonex.   Anna Walker was seen in the ED on 08/27/18 with 1 day of bilateral blurred vision, of gradual onset. It was monocular in either eye.  Anna Walker first noticed it because Anna Walker had trouble driving.  Anna Walker had some right eye ache and dizziness but no headache or new numbness and tingling   MRI of brain/orbits and cervical spine with and without contrast appeared stable and showed no active demyelinating lesions or abnormal signal of optic nerves.  Anna Walker was diagnosed by in-house neurology with migraine and was treated with migraine cocktail of toradol and Reglan.  Anna Walker doesn't have vertigo but Anna Walker feels "insecure" and problems with balance when ambulating.  Blurred vision was unchanged.  Anna Walker followed up with her ophthalmologist, Dr. Gershon Crane.  Eye exam was unremarkable except for early cataracts.    Anna Walker has pain in the legs and gait issues.  Anna Walker does have arthritis in the knees, which required surgery and had a subsequent DVT.  Anna Walker is treated for restless leg syndrome, for which Anna Walker takes ropinirole and gabapentin.  Anna Walker has chronic burning in the hands.   Anna Walker also reports  chronic fatigue.  Anna Walker has OSA and is on CPAP.  Anna Walker takes amantadine.   II MIGRAINES Anna Walker has history of migraines since early adulthood.  They are severe bifrontal pain associated with nausea, photophobia and phonophobia but no vomiting, new visual disturbance or unilateral numbness or weakness.  They last 1 to 2 days and usually occur once a year.  There are no specific triggers or relieving factors however eye gets more blurred when fatigued or with drop in blood sugar.    Anna Walker also reports another migraine described as severe sharp right retro-orbital pain in September 2019.  One one occasion, pain radiated into right ear and posterior neck.  Constant but fluctuates.  There is associated blurred vision in right eye, dizziness, nausea, photophobia and phonophobia, eye twitching and fatigue.  No associated right sided numbness and weakness.     Past medication:  topiramate  PAST MEDICAL HISTORY: Past Medical History:  Diagnosis Date   ADD (attention deficit disorder)    Anxiety    Biliary colic    Bladder leak    Diabetes mellitus without complication (HCC)    type 2 , diet controlled  patient not taking any medications   Diverticular disease    DVT (deep venous thrombosis) (HCC)    left leg , resolved after treatment of blood thinners    Edema of both lower extremities    GERD (gastroesophageal reflux disease)    causes coughing    Glaucoma    Heart murmur    High cholesterol    Hypertension    Hypothyroid    Leg cramps    "in my thighs"    Leg cramps    Migraines    Multiple sclerosis ([place])    per patient " that ha sbeen ruled out , they though it was that initially but it did not preogress like it"   Optic neuritis    lesion on right optic nerve; right eye only , reduced vision and blurriness , loss of depth perception, glasses do not help when it flares     OSA (obstructive sleep apnea)    nightly cpap    Paresthesias/numbness    fingers and toes    Pneumonia 2017    Restless leg syndrome    Sleep apnea    SOB (shortness of breath)    Thyroid disease    Hypothyroidism    MEDICATIONS: Current Outpatient Medications on File Prior to Visit  Medication Sig Dispense Refill   albuterol (VENTOLIN HFA) 108 (90 Base) MCG/ACT inhaler Inhale 1 puff into the lungs every 4 (four) hours as needed.     amantadine (SYMMETREL) 100 MG capsule TAKE 1 CAPSULE(100 MG) BY MOUTH THREE TIMES DAILY (Patient taking differently: Take 200 mg by mouth daily.) 270 capsule 0   benzonatate (TESSALON) 100 MG capsule Take 1 capsule (100 mg total) by mouth every 8 (eight) hours. 21 capsule 0   FLUoxetine (PROZAC) 20 MG capsule Take 40 mg by mouth at bedtime.     fluticasone furoate-vilanterol (BREO ELLIPTA) 100-25 MCG/ACT AEPB Inhale 1 puff into the lungs daily. 60 each 2   gabapentin (NEURONTIN) 300 MG capsule TAKE 1 CAPSULE BY MOUTH AT DINNER AND 2 CAPSULES AT BEDTIME (Patient taking differently: Take 600 mg by mouth at bedtime.) 270 capsule 0   hydrochlorothiazide (HYDRODIURIL) 12.5 MG tablet Take 12.5 mg by mouth daily.      levothyroxine (SYNTHROID) 137 MCG tablet Take 137 mcg by mouth daily before breakfast.     Menthol, Topical Analgesic, (BIOFREEZE) 4 % GEL Apply 1 application. topically daily as needed (pain).     rOPINIRole (REQUIP) 3 MG tablet TAKE 1 TABLET BY MOUTH AT [facility] AND AT BEDTIME AS NEEDED (Patient taking differently: Take 3 mg by mouth at bedtime.) 60 tablet 3   simvastatin (ZOCOR) 10 MG tablet Take 10 mg by mouth daily.     Vitamin D, Ergocalciferol, (DRISDOL) 1.25 MG (50000 UNIT) CAPS capsule Take 50,000 Units by mouth 2 (two) times a week. Tues, Thur     No current facility-administered medications on file prior to visit.    ALLERGIES: Allergies  Allergen Reactions   Adderall [Amphetamine-Dextroamphetamine] Other (See Comments)    Caused elevated BP   Topamax [Topiramate]     dizzy    FAMILY HISTORY: Family History  Problem Relation Age of Onset    Barrett's esophagus Mother    Hypertension Mother    Obesity Mother    Berenice Primas' disease Sister    Rheum arthritis Sister    Heart attack Father 36   Sudden death Father    Coronary artery disease Neg Hx  Objective:  *** General: No acute distress.  Patient appears well-groomed.   Head:  Normocephalic/atraumatic Eyes:  Fundi examined but not visualized Neck: supple, paraspinal tenderness, full range of motion Heart:  Regular rate and rhythm Neurological Exam: ***   Metta Clines, DO  CC: Kelton Pillar, MD

## 2022-03-11 DIAGNOSIS — G35 Multiple sclerosis: Secondary | ICD-10-CM | POA: Diagnosis not present

## 2022-03-11 DIAGNOSIS — F339 Major depressive disorder, recurrent, unspecified: Secondary | ICD-10-CM | POA: Diagnosis not present

## 2022-03-11 DIAGNOSIS — E559 Vitamin D deficiency, unspecified: Secondary | ICD-10-CM | POA: Diagnosis not present

## 2022-03-11 DIAGNOSIS — G2581 Restless legs syndrome: Secondary | ICD-10-CM | POA: Diagnosis not present

## 2022-03-11 DIAGNOSIS — R7303 Prediabetes: Secondary | ICD-10-CM | POA: Diagnosis not present

## 2022-03-11 DIAGNOSIS — Z0000 Encounter for general adult medical examination without abnormal findings: Secondary | ICD-10-CM | POA: Diagnosis not present

## 2022-03-11 DIAGNOSIS — H469 Unspecified optic neuritis: Secondary | ICD-10-CM | POA: Diagnosis not present

## 2022-03-11 DIAGNOSIS — Z6841 Body Mass Index (BMI) 40.0 and over, adult: Secondary | ICD-10-CM | POA: Diagnosis not present

## 2022-03-11 DIAGNOSIS — E039 Hypothyroidism, unspecified: Secondary | ICD-10-CM | POA: Diagnosis not present

## 2022-03-11 DIAGNOSIS — G4733 Obstructive sleep apnea (adult) (pediatric): Secondary | ICD-10-CM | POA: Diagnosis not present

## 2022-03-11 DIAGNOSIS — I10 Essential (primary) hypertension: Secondary | ICD-10-CM | POA: Diagnosis not present

## 2022-03-11 DIAGNOSIS — E785 Hyperlipidemia, unspecified: Secondary | ICD-10-CM | POA: Diagnosis not present

## 2022-03-14 ENCOUNTER — Ambulatory Visit: Payer: PPO | Admitting: Neurology

## 2022-03-16 ENCOUNTER — Other Ambulatory Visit: Payer: Self-pay | Admitting: Podiatry

## 2022-03-16 ENCOUNTER — Ambulatory Visit (INDEPENDENT_AMBULATORY_CARE_PROVIDER_SITE_OTHER): Payer: HMO | Admitting: Podiatry

## 2022-03-16 ENCOUNTER — Ambulatory Visit (INDEPENDENT_AMBULATORY_CARE_PROVIDER_SITE_OTHER): Payer: HMO

## 2022-03-16 DIAGNOSIS — M79672 Pain in left foot: Secondary | ICD-10-CM | POA: Diagnosis not present

## 2022-03-16 DIAGNOSIS — M7752 Other enthesopathy of left foot: Secondary | ICD-10-CM

## 2022-03-16 DIAGNOSIS — M778 Other enthesopathies, not elsewhere classified: Secondary | ICD-10-CM

## 2022-03-16 NOTE — Progress Notes (Signed)
Subjective:  Patient ID: Anna Walker, female    DOB: 12-03-1955,  MRN: [id number]  Chief Complaint  Patient presents with   Toe Pain    Left foot 3rd toe pain    67 y.o. female presents with the above complaint.  Patient presents with complaint left third metatarsophalangeal joint pain.  Patient states is very painful has progressive gotten worse she is a diabetic but diet controlled.  She states the pain came out of nowhere.  She wanted make sure that there is nothing that is broken.  She does not recall any injury.   Review of Systems: Negative except as noted in the HPI. Denies N/V/F/Ch.  Past Medical History:  Diagnosis Date   ADD (attention deficit disorder)    Anxiety    Biliary colic    Bladder leak    Diabetes mellitus without complication (HCC)    type 2 , diet controlled patient not taking any medications   Diverticular disease    DVT (deep venous thrombosis) (HCC)    left leg , resolved after treatment of blood thinners    Edema of both lower extremities    GERD (gastroesophageal reflux disease)    causes coughing    Glaucoma    Heart murmur    High cholesterol    Hypertension    Hypothyroid    Leg cramps    "in my thighs"    Leg cramps    Migraines    Multiple sclerosis ([place])    per patient " that ha sbeen ruled out , they though it was that initially but it did not preogress like it"   Optic neuritis    lesion on right optic nerve; right eye only , reduced vision and blurriness , loss of depth perception, glasses do not help when it flares     OSA (obstructive sleep apnea)    nightly cpap    Paresthesias/numbness    fingers and toes    Pneumonia 2017   Restless leg syndrome    Sleep apnea    SOB (shortness of breath)    Thyroid disease    Hypothyroidism    Current Outpatient Medications:    albuterol (VENTOLIN HFA) 108 (90 Base) MCG/ACT inhaler, Inhale 1 puff into the lungs every 4 (four) hours as needed., Disp: , Rfl:    amantadine  (SYMMETREL) 100 MG capsule, TAKE 1 CAPSULE(100 MG) BY MOUTH THREE TIMES DAILY (Patient taking differently: Take 200 mg by mouth daily.), Disp: 270 capsule, Rfl: 0   benzonatate (TESSALON) 100 MG capsule, Take 1 capsule (100 mg total) by mouth every 8 (eight) hours., Disp: 21 capsule, Rfl: 0   FLUoxetine (PROZAC) 20 MG capsule, Take 40 mg by mouth at bedtime., Disp: , Rfl:    fluticasone furoate-vilanterol (BREO ELLIPTA) 100-25 MCG/ACT AEPB, Inhale 1 puff into the lungs daily., Disp: 60 each, Rfl: 2   gabapentin (NEURONTIN) 300 MG capsule, TAKE 1 CAPSULE BY MOUTH AT DINNER AND 2 CAPSULES AT BEDTIME (Patient taking differently: Take 600 mg by mouth at bedtime.), Disp: 270 capsule, Rfl: 0   hydrochlorothiazide (HYDRODIURIL) 12.5 MG tablet, Take 12.5 mg by mouth daily. , Disp: , Rfl:    levothyroxine (SYNTHROID) 137 MCG tablet, Take 137 mcg by mouth daily before breakfast., Disp: , Rfl:    Menthol, Topical Analgesic, (BIOFREEZE) 4 % GEL, Apply 1 application. topically daily as needed (pain)., Disp: , Rfl:    rOPINIRole (REQUIP) 3 MG tablet, TAKE 1 TABLET BY MOUTH AT [facility] AND  AT BEDTIME AS NEEDED (Patient taking differently: Take 3 mg by mouth at bedtime.), Disp: 60 tablet, Rfl: 3   simvastatin (ZOCOR) 10 MG tablet, Take 10 mg by mouth daily., Disp: , Rfl:    Vitamin D, Ergocalciferol, (DRISDOL) 1.25 MG (50000 UNIT) CAPS capsule, Take 50,000 Units by mouth 2 (two) times a week. Tues, Thur, Disp: , Rfl:   Social History   Tobacco Use  Smoking Status Former   Types: Cigarettes   Quit date: 01/25/1984   Years since quitting: 38.1  Smokeless Tobacco Never    Allergies  Allergen Reactions   Adderall [Amphetamine-Dextroamphetamine] Other (See Comments)    Caused elevated BP   Topamax [Topiramate]     dizzy   Objective:  There were no vitals filed for this visit. There is no height or weight on file to calculate BMI. Constitutional Well developed. Well nourished.  Vascular Dorsalis pedis pulses  palpable bilaterally. Posterior tibial pulses palpable bilaterally. Capillary refill normal to all digits.  No cyanosis or clubbing noted. Pedal hair growth normal.  Neurologic Normal speech. Oriented to person, place, and time. Epicritic sensation to light touch grossly present bilaterally.  Dermatologic Nails well groomed and normal in appearance. No open wounds. No skin lesions.  Orthopedic: Pain on palpation of left third metatarsophalangeal joint mild pain with range of motion of the joint no deep intra-articular pain noted.  No pain at the third toe.   Radiographs: 3 views of skeletally mature the left foot: No fractures noted no osteoarthritis noted pes planovalgus deformity noted.  Some hallux limitus noted Assessment:   1. Capsulitis of metatarsophalangeal (MTP) joint of left foot    Plan:  Patient was evaluated and treated and all questions answered.  Left third metatarsophalangeal joint capsulitis -All questions and concerns were discussed with the patient in extensive detail -at this time her pain is very mild to moderate in nature she would like to hold off on injection if it continues to regress she will come back and see me and we will discuss steroid injection at that time -I extensively discussed shoe gear modification   No follow-ups on file.   Left third MTP capsulitis shoe manage with shoe gear modification

## 2022-03-30 NOTE — Progress Notes (Unsigned)
NEUROLOGY FOLLOW UP OFFICE NOTE  SAHVANNAH AGU [id number]  Assessment/Plan:   1.  Migraine with aura, without status migrainosus, not intractable 2.  Recurrent symptoms of right-sided heaviness/numbness, likely complicated migraine.  He has history of of right optic neuritis, but based on testing that has been performed, I do not believe Anna Walker has multiple sclerosis.  CSF analysis revealed 1 oligoclonal band, which is not a significant number.  Anna Walker has had repeated MRIs over the years with minimal (3 ) tiny hyperintense foci in the periventricular white matter which have been stable.  Given her longstanding history with recurrent flare-ups, I would expect radiographic evidence of disease progression on MRI.  Anna Walker also does not exhibit abnormal findings in other regions of the CNS, such as supratentorial region or spinal cord.   3.  Restless leg syndrome 4.  Nocturnal leg cramps   1.  Nocturnal leg cramps:  increase gabapentin bedtime dose to '900mg'$ .  Check Mg 2.  Migraine prevention:  gabapentin (now '300mg'$  at dinner and '900mg'$  at bedtime 3.  RLS:  ropinirole '3mg'$  at bedtime, gabapentin '300mg'$  at dinner and '900mg'$  at bedtime 4.  Limit use of pain relievers to no more than 2 days out of week to prevent risk of rebound or medication-overuse headache. 5.  Keep headache diary 6.  Follow up 8 months.   Subjective:  Anna Walker is a 67 year old woman with ADD, fatigue, hypertension, restless leg syndrome, migraines, hyperlipidemia, type 2 diabetes mellitus, OSA, hypothyroidism and history of right optic neuritis who follows up for migraines and RLS.Marland Kitchen   UPDATE: Migraines are controlled.  Anna Walker has not had one since last visit.  Restless leg is overall stable.  However Anna Walker continues to have nocturnal leg cramps that wakes her up at night.  Takes gabapentin '600mg'$  at bedtime and ropinirole '3mg'$  at dinner and at bedtime.  If needed, Anna Walker takes '300mg'$  gabapentin earlier in the evening.   Current  NSAIDS:  ASA '81mg'$  daily Current analgesics:  no Current triptans:  sumatriptan '100mg'$  Current anti-emetic:  no Current muscle relaxants:  no Current anti-anxiolytic:  no Current sleep aide:  no Current Antihypertensive medications:  HCTZ Current Antidepressant medications:  fluoxetine '30mg'$  Current Anticonvulsant medications:  gabapentin '300mg'$  evening PRN and '600mg'$  at bedtime Other medications:  Ropinirole '3mg'$  dinner and '3mg'$  bedtime, amantadine '100mg'$  three times daily (fatigue)    HISTORY: I RIGHT OPTIC NEURITIS AND OTHER RECURRENT SYMPTOMS WITH QUESTIONABLE MULTIPLE SCLEROSIS: Anna Walker had an episode of right optic neuritis in 1998, presenting first as blurred vision and then complete vision loss.   A couple of weeks later, Anna Walker developed right sided numbness of the face, arm and leg.  Anna Walker underwent a lumbar puncture which demonstrated one oligoclonal band.  MRI of brain reportedly may have shown foci which may be concerning for MS.  MRI cervical spine reportedly did not demonstrate demyelinating lesions.  Anna Walker was diagnosed with multiple sclerosis and treated with IV steroids.  The vision loss lasted 7-8 weeks.  The numbness lasted several days.  Anna Walker was treated with DMT for several years.  Anna Walker started on Betaseron but was then switched to Copaxone and then Avonex due to intolerability.  Anna Walker had a repeat LP in 2002 which showed no oligoclonal bands and normal IgG index of 0.6.  Visual evoked potential in 2013 showed absent right response and normal left response.  Repeat MRI from July 2014 was thought to be essentially normal so Avonex was discontinued.  Repeat MRI in November  2014 was unchanged.  Therefore, it was believed that Anna Walker did not have MS.  NMO-IgG antibodies from 02/24/14 were negative.  MRI of cervical spine without contrast from 03/24/16 was personally reviewed and revealed cervical spondylosis but no abnormal cord signal.   Since the episode in 1998, Anna Walker endorses subtle weakness on the right side  of her body.  For example, when Anna Walker starts to write, her handwriting will start to get worse.  Anna Walker also reports short-term memory deficits since 1998.  Over the years, Anna Walker has had recurrent episodes of right eye pain with right sided numbness.  In the past, they were thought to be MS flares which were treated with steroids.  They occur when Anna Walker is heated or fatigued.  They typically occur at least once a year and they last about 2 days.  Anna Walker last had an episode in early January 2019, for which Anna Walker was evaluated in the ED on 01/30/17.  Anna Walker had an MRI of the brain and orbits with and without contrast, which was personally reviewed and demonstrated asymmetrically small right optic nerve compatible with sequelae of prior optic neuritis but no enhancement, as well as three nonspecific tiny hyperintense T2/FLAIR foci in the bifrontal periventricular white matter.  All of her symptoms have not progressed over the years and recurrence of these episodes have not increased since discontinuation of Avonex.   Anna Walker was seen in the ED on 08/27/18 with 1 day of bilateral blurred vision, of gradual onset. It was monocular in either eye.  Anna Walker first noticed it because Anna Walker had trouble driving.  Anna Walker had some right eye ache and dizziness but no headache or new numbness and tingling   MRI of brain/orbits and cervical spine with and without contrast appeared stable and showed no active demyelinating lesions or abnormal signal of optic nerves.  Anna Walker was diagnosed by in-house neurology with migraine and was treated with migraine cocktail of toradol and Reglan.  Anna Walker doesn't have vertigo but Anna Walker feels "insecure" and problems with balance when ambulating.  Blurred vision was unchanged.  Anna Walker followed up with her ophthalmologist, Dr. Gershon Crane.  Eye exam was unremarkable except for early cataracts.    Anna Walker has pain in the legs and gait issues.  Anna Walker does have arthritis in the knees, which required surgery and had a subsequent DVT.  Anna Walker is treated for  restless leg syndrome, for which Anna Walker takes ropinirole and gabapentin.  Anna Walker has chronic burning in the hands.   Anna Walker also reports chronic fatigue.  Anna Walker has OSA and is on CPAP.  Anna Walker takes amantadine.   II MIGRAINES Anna Walker has history of migraines since early adulthood.  They are severe bifrontal pain associated with nausea, photophobia and phonophobia but no vomiting, new visual disturbance or unilateral numbness or weakness.  They last 1 to 2 days and usually occur once a year.  There are no specific triggers or relieving factors however eye gets more blurred when fatigued or with drop in blood sugar.    Anna Walker also reports another migraine described as severe sharp right retro-orbital pain in September 2019.  One one occasion, pain radiated into right ear and posterior neck.  Constant but fluctuates.  There is associated blurred vision in right eye, dizziness, nausea, photophobia and phonophobia, eye twitching and fatigue.  No associated right sided numbness and weakness.     Past medication:  topiramate  PAST MEDICAL HISTORY: Past Medical History:  Diagnosis Date   ADD (attention deficit disorder)    Anxiety  Biliary colic    Bladder leak    Diabetes mellitus without complication (HCC)    type 2 , diet controlled patient not taking any medications   Diverticular disease    DVT (deep venous thrombosis) (HCC)    left leg , resolved after treatment of blood thinners    Edema of both lower extremities    GERD (gastroesophageal reflux disease)    causes coughing    Glaucoma    Heart murmur    High cholesterol    Hypertension    Hypothyroid    Leg cramps    "in my thighs"    Leg cramps    Migraines    Multiple sclerosis ([place])    per patient " that ha sbeen ruled out , they though it was that initially but it did not preogress like it"   Optic neuritis    lesion on right optic nerve; right eye only , reduced vision and blurriness , loss of depth perception, glasses do not help when it  flares     OSA (obstructive sleep apnea)    nightly cpap    Paresthesias/numbness    fingers and toes    Pneumonia 2017   Restless leg syndrome    Sleep apnea    SOB (shortness of breath)    Thyroid disease    Hypothyroidism    MEDICATIONS: Current Outpatient Medications on File Prior to Visit  Medication Sig Dispense Refill   albuterol (VENTOLIN HFA) 108 (90 Base) MCG/ACT inhaler Inhale 1 puff into the lungs every 4 (four) hours as needed.     amantadine (SYMMETREL) 100 MG capsule TAKE 1 CAPSULE(100 MG) BY MOUTH THREE TIMES DAILY (Patient taking differently: Take 200 mg by mouth daily.) 270 capsule 0   benzonatate (TESSALON) 100 MG capsule Take 1 capsule (100 mg total) by mouth every 8 (eight) hours. 21 capsule 0   FLUoxetine (PROZAC) 20 MG capsule Take 40 mg by mouth at bedtime.     fluticasone furoate-vilanterol (BREO ELLIPTA) 100-25 MCG/ACT AEPB Inhale 1 puff into the lungs daily. 60 each 2   gabapentin (NEURONTIN) 300 MG capsule TAKE 1 CAPSULE BY MOUTH AT DINNER AND 2 CAPSULES AT BEDTIME (Patient taking differently: Take 600 mg by mouth at bedtime.) 270 capsule 0   hydrochlorothiazide (HYDRODIURIL) 12.5 MG tablet Take 12.5 mg by mouth daily.      levothyroxine (SYNTHROID) 137 MCG tablet Take 137 mcg by mouth daily before breakfast.     Menthol, Topical Analgesic, (BIOFREEZE) 4 % GEL Apply 1 application. topically daily as needed (pain).     rOPINIRole (REQUIP) 3 MG tablet TAKE 1 TABLET BY MOUTH AT [facility] AND AT BEDTIME AS NEEDED (Patient taking differently: Take 3 mg by mouth at bedtime.) 60 tablet 3   simvastatin (ZOCOR) 10 MG tablet Take 10 mg by mouth daily.     Vitamin D, Ergocalciferol, (DRISDOL) 1.25 MG (50000 UNIT) CAPS capsule Take 50,000 Units by mouth 2 (two) times a week. Tues, Thur     No current facility-administered medications on file prior to visit.    ALLERGIES: Allergies  Allergen Reactions   Adderall [Amphetamine-Dextroamphetamine] Other (See Comments)     Caused elevated BP   Topamax [Topiramate]     dizzy    FAMILY HISTORY: Family History  Problem Relation Age of Onset   Barrett's esophagus Mother    Hypertension Mother    Obesity Mother    Berenice Primas' disease Sister    Rheum arthritis Sister  Heart attack Father 12   Sudden death Father    Coronary artery disease Neg Hx       Objective:  Blood pressure 128/78, pulse 79, height '5\' 6"'$  (1.676 m), weight 268 lb 9.6 oz (121.8 kg), SpO2 98 %. General: No acute distress.  Patient appears well-groomed.   Head:  Normocephalic/atraumatic Eyes:  Fundi examined but not visualized Neck: supple, paraspinal tenderness, full range of motion Heart:  Regular rate and rhythm Neurological Exam: alert and oriented to person, place, and time.  Speech fluent and not dysarthric, language intact.  CN II-XII intact. Bulk and tone normal, muscle strength 5/5 throughout.  Sensation to light touch intact.  Deep tendon reflexes 1+ throughout.  Finger to nose testing intact.  Cautious gait.   Metta Clines, DO  CC: Collene Leyden, MD

## 2022-03-31 ENCOUNTER — Other Ambulatory Visit: Payer: Self-pay | Admitting: Neurology

## 2022-03-31 ENCOUNTER — Encounter: Payer: Self-pay | Admitting: Neurology

## 2022-03-31 ENCOUNTER — Ambulatory Visit: Payer: PPO | Admitting: Neurology

## 2022-03-31 ENCOUNTER — Other Ambulatory Visit (INDEPENDENT_AMBULATORY_CARE_PROVIDER_SITE_OTHER): Payer: PPO

## 2022-03-31 VITALS — BP 128/78 | HR 79 | Ht 66.0 in | Wt 268.6 lb

## 2022-03-31 DIAGNOSIS — G2581 Restless legs syndrome: Secondary | ICD-10-CM

## 2022-03-31 DIAGNOSIS — G43109 Migraine with aura, not intractable, without status migrainosus: Secondary | ICD-10-CM | POA: Diagnosis not present

## 2022-03-31 DIAGNOSIS — G4762 Sleep related leg cramps: Secondary | ICD-10-CM | POA: Diagnosis not present

## 2022-03-31 LAB — MAGNESIUM: Magnesium: 1.8 mg/dL (ref 1.5–2.5)

## 2022-03-31 MED ORDER — GABAPENTIN 300 MG PO CAPS: ORAL_CAPSULE | ORAL | 1 refills | Status: DC

## 2022-03-31 NOTE — Patient Instructions (Signed)
Increase gabapentin to 1 pill at dinner and 3 pills at bedtime Check magnesium level Follow up 8 months.

## 2022-04-05 DIAGNOSIS — R059 Cough, unspecified: Secondary | ICD-10-CM | POA: Diagnosis not present

## 2022-04-05 DIAGNOSIS — E119 Type 2 diabetes mellitus without complications: Secondary | ICD-10-CM | POA: Diagnosis not present

## 2022-04-05 DIAGNOSIS — J069 Acute upper respiratory infection, unspecified: Secondary | ICD-10-CM | POA: Diagnosis not present

## 2022-04-05 DIAGNOSIS — I10 Essential (primary) hypertension: Secondary | ICD-10-CM | POA: Diagnosis not present

## 2022-04-14 DIAGNOSIS — E785 Hyperlipidemia, unspecified: Secondary | ICD-10-CM | POA: Diagnosis not present

## 2022-04-15 DIAGNOSIS — M79605 Pain in left leg: Secondary | ICD-10-CM | POA: Diagnosis not present

## 2022-04-15 DIAGNOSIS — M79604 Pain in right leg: Secondary | ICD-10-CM | POA: Diagnosis not present

## 2022-04-19 ENCOUNTER — Encounter (HOSPITAL_COMMUNITY): Payer: PPO

## 2022-04-20 ENCOUNTER — Other Ambulatory Visit (HOSPITAL_COMMUNITY): Payer: Self-pay | Admitting: Family Medicine

## 2022-04-20 ENCOUNTER — Ambulatory Visit (HOSPITAL_COMMUNITY)
Admission: RE | Admit: 2022-04-20 | Discharge: 2022-04-20 | Disposition: A | Discharge status: Home / Self Care | Payer: PPO | Source: Ambulatory Visit | Attending: Vascular Surgery | Admitting: Vascular Surgery

## 2022-04-20 ENCOUNTER — Encounter (HOSPITAL_COMMUNITY): Payer: Self-pay | Admitting: Family Medicine

## 2022-04-20 ENCOUNTER — Ambulatory Visit (INDEPENDENT_AMBULATORY_CARE_PROVIDER_SITE_OTHER)
Admission: RE | Admit: 2022-04-20 | Discharge: 2022-04-20 | Disposition: A | Discharge status: Home / Self Care | Payer: PPO | Source: Ambulatory Visit | Attending: Vascular Surgery | Admitting: Vascular Surgery

## 2022-04-20 DIAGNOSIS — I739 Peripheral vascular disease, unspecified: Secondary | ICD-10-CM | POA: Diagnosis not present

## 2022-04-21 LAB — VAS US ABI WITH/WO TBI
Left ABI: 1.22
Right ABI: 1.25

## 2022-04-27 ENCOUNTER — Encounter: Payer: Self-pay | Admitting: Pulmonary Disease

## 2022-04-27 ENCOUNTER — Ambulatory Visit (INDEPENDENT_AMBULATORY_CARE_PROVIDER_SITE_OTHER): Payer: PPO | Admitting: Pulmonary Disease

## 2022-04-27 VITALS — BP 136/84 | HR 79 | Ht 66.0 in | Wt 265.0 lb

## 2022-04-27 DIAGNOSIS — J45998 Other asthma: Secondary | ICD-10-CM

## 2022-04-27 NOTE — Progress Notes (Signed)
Synopsis: Referred in January 2024 for shortness of breath by Kelton Pillar, MD  Subjective:   PATIENT ID: Anna Walker GENDER: female DOB: [birth year], MRN: [id number]  HPI  Chief Complaint  Patient presents with   Follow-up    2 mo f/u. States her breathing has improved since last visit. Still using the Breo daily and albuterol as needed.    Anna Walker is a 67 year old woman, former smoker with DMII, GERD, DVT, HTN, MS and OSA who returns to pulmonary clinic for post-viral reactive airways disease.   She was started on breo 100 daily at last visit and continued on as needed albuterol. She reports her cough and breathing are much improved. She tried coming off the breo inhaler after the first month but then developed a GI illness and had return of her cough and wheezing where she resumed the breo treatment.   She complains of lower leg pain that is worse when on her feet a lot. Recent ABI evaluation was normal. She started on rosuvastatin recently. No inflammatory markers checked in the past. She has history of leg cramping. Magnesium level 1.8 on recent check. No skin rashes or other joint/muscle pains.  Initial OV 02/23/22 She had covid 19 infection in December and was treated with antiviral. Since her infection she has cough, wheezing and dyspnea that can wake her up at night. She is using albuterol inhaler as needed which helps her symptoms. She is coughing up clear mucous. No hemoptysis. Denies fevers and chills. She has swelling that comes/goes in her legs if she is up standing/walking a lot. No changes in her leg swelling since covid.  She has history of DVT after a meniscus repair. She does have GERD and takes mylanta as needed.   She quit smoking in 1986. Smoked for 10 years, about half a pack per day. Her father died from a blood clot at age 29. Her mother had recurrent bronchitis.   Past Medical History:  Diagnosis Date   ADD (attention deficit disorder)     Anxiety    Biliary colic    Bladder leak    Diabetes mellitus without complication    type 2 , diet controlled patient not taking any medications   Diverticular disease    DVT (deep venous thrombosis)    left leg , resolved after treatment of blood thinners    Edema of both lower extremities    GERD (gastroesophageal reflux disease)    causes coughing    Glaucoma    Heart murmur    High cholesterol    Hypertension    Hypothyroid    Leg cramps    "in my thighs"    Leg cramps    Migraines    Multiple sclerosis    per patient " that ha sbeen ruled out , they though it was that initially but it did not preogress like it"   Optic neuritis    lesion on right optic nerve; right eye only , reduced vision and blurriness , loss of depth perception, glasses do not help when it flares     OSA (obstructive sleep apnea)    nightly cpap    Paresthesias/numbness    fingers and toes    Pneumonia 2017   Restless leg syndrome    Sleep apnea    SOB (shortness of breath)    Thyroid disease    Hypothyroidism     Family History  Problem Relation Age of Onset   Barrett's  esophagus Mother    Hypertension Mother    Obesity Mother    Berenice Primas' disease Sister    Rheum arthritis Sister    Heart attack Father 1   Sudden death Father    Coronary artery disease Neg Hx      Social History   Socioeconomic History   Marital status: Widowed    Spouse name: Not on file   Number of children: 2   Years of education: Not on file   Highest education level: Some college, no degree  Occupational History   Occupation: disabled  Tobacco Use   Smoking status: Former    Types: Cigarettes    Quit date: 01/25/1984    Years since quitting: 38.2   Smokeless tobacco: Never  Vaping Use   Vaping Use: Never used  Substance and Sexual Activity   Alcohol use: Not Currently   Drug use: No   Sexual activity: Not Currently    Birth control/protection: Surgical  Other Topics Concern   Not on file  Social  History Narrative   Pt is right handed. She is married, lives with her husband in a 1 story house, ramp to enter. She drinks 2 cups of coffee a day.       Husband has passed away on 12/09/20   Social Determinants of Health   Financial Resource Strain: Not on file  Food Insecurity: Not on file  Transportation Needs: Not on file  Physical Activity: Not on file  Stress: Not on file  Social Connections: Not on file  Intimate Partner Violence: Not on file     Allergies  Allergen Reactions   Adderall [Amphetamine-Dextroamphetamine] Other (See Comments)    Caused elevated BP   Topamax [Topiramate]     dizzy     Outpatient Medications Prior to Visit  Medication Sig Dispense Refill   albuterol (VENTOLIN HFA) 108 (90 Base) MCG/ACT inhaler Inhale 1 puff into the lungs every 4 (four) hours as needed.     amantadine (SYMMETREL) 100 MG capsule Take 2 capsules (200 mg total) by mouth daily. 180 capsule 1   FLUoxetine (PROZAC) 20 MG capsule Take 40 mg by mouth at bedtime.     fluticasone furoate-vilanterol (BREO ELLIPTA) 100-25 MCG/ACT AEPB Inhale 1 puff into the lungs daily.     gabapentin (NEURONTIN) 300 MG capsule Take 1 capsule at dinner and 3 capsules at bedtime. 360 capsule 1   hydrochlorothiazide (HYDRODIURIL) 12.5 MG tablet Take 12.5 mg by mouth daily.      levothyroxine (SYNTHROID) 137 MCG tablet Take 137 mcg by mouth daily before breakfast.     Menthol, Topical Analgesic, (BIOFREEZE) 4 % GEL Apply 1 application. topically daily as needed (pain).     rOPINIRole (REQUIP) 3 MG tablet TAKE 1 TABLET BY MOUTH AT [facility] AND AT BEDTIME AS NEEDED (Patient taking differently: Take 3 mg by mouth at bedtime.) 60 tablet 3   rosuvastatin (CRESTOR) 10 MG tablet Take 10 mg by mouth daily.     Vitamin D, Ergocalciferol, (DRISDOL) 1.25 MG (50000 UNIT) CAPS capsule Take 50,000 Units by mouth 2 (two) times a week. Tues, Thur     No facility-administered medications prior to visit.    Review of  Systems  Constitutional:  Negative for chills, fever, malaise/fatigue and weight loss.  HENT:  Negative for congestion, sinus pain and sore throat.   Eyes: Negative.   Respiratory:  Negative for cough, hemoptysis, sputum production, shortness of breath and wheezing.   Cardiovascular:  Negative for chest  pain, palpitations, orthopnea, claudication and leg swelling.  Gastrointestinal:  Negative for abdominal pain, heartburn, nausea and vomiting.  Genitourinary: Negative.   Musculoskeletal:  Negative for joint pain and myalgias.       Lower leg pain  Skin:  Negative for rash.  Neurological:  Negative for weakness.  Endo/Heme/Allergies: Negative.    Objective:   Vitals:   04/27/22 0920  BP: 136/84  Pulse: 79  SpO2: 98%  Weight: 265 lb (120.2 kg)  Height: 5\' 6"  (1.676 m)   Physical Exam Constitutional:      General: She is not in acute distress.    Appearance: She is obese. She is not ill-appearing.  HENT:     Head: Normocephalic and atraumatic.  Eyes:     General: No scleral icterus.    Conjunctiva/sclera: Conjunctivae normal.  Cardiovascular:     Rate and Rhythm: Normal rate and regular rhythm.     Pulses: Normal pulses.     Heart sounds: Normal heart sounds. No murmur heard. Pulmonary:     Effort: Pulmonary effort is normal.     Breath sounds: Normal breath sounds. No wheezing, rhonchi or rales.  Musculoskeletal:     Right lower leg: No edema.     Left lower leg: No edema.  Skin:    General: Skin is warm and dry.  Neurological:     General: No focal deficit present.     Mental Status: She is alert.    CBC    Component Value Date/Time   WBC 5.9 02/17/2022 0902   RBC 4.48 02/17/2022 0902   HGB 12.3 02/17/2022 0902   HCT 39.0 02/17/2022 0902   PLT 264 02/17/2022 0902   MCV 87.1 02/17/2022 0902   MCH 27.5 02/17/2022 0902   MCHC 31.5 02/17/2022 0902   RDW 14.8 02/17/2022 0902   LYMPHSABS 1.5 08/20/2020 1510   MONOABS 0.5 08/20/2020 1510   EOSABS 0.0 08/20/2020  1510   BASOSABS 0.0 08/20/2020 1510     Chest imaging: CT Chest 02/10/22 - Lower Neck: Unremarkable.   - Heart and Vessels: Heart size is normal without pericardial effusion. Few aortic valve calcifications. Few possible small coronary calcifications. No thoracic aortic aneurysm. Main pulmonary artery is normal in caliber.   - Mediastinum, Hila and Axilla: Mild shotty mediastinal lymph nodes, none pathologically enlarged. No axillary adenopathy.   - Lungs: Central airways are patent. Few pulmonary micronodules.   - Pleura: No pleural effusion or pneumothorax.   - Upper Abdomen: Unremarkable.   - Musculoskeletal: No acute fracture or aggressive osseous lesion. Mild to moderate spondylosis. Cervical ACDF. No acute soft tissue finding.   - Miscellaneous: N/A.   PFT:     No data to display          Labs:  Path:  Echo:  Heart Catheterization:       Assessment & Plan:   Post-viral reactive airway disease  Discussion: Anna Walker is a 67 year old woman, former smoker with DMII, GERD, DVT, HTN, MS and OSA who returns to pulmonary clinic for shortness of breath.   She has post-viral reactive airways disease due to her recent covid 19 infection. She is to continue breo ellipta 100-67mcg 1 puff daily and continue as needed albuterol. She is to continue breo inhaler until July and try weaning off then.   She does not appear to have myositis presentation that would account for her lower extremity pain. Consider checking ESR, CRP, CK, aldolase and ANCA levels in the future.  Follow up in 6 months.   Freda Jackson, MD [facility] Office: [phone]   Current Outpatient Medications:    albuterol (VENTOLIN HFA) 108 (90 Base) MCG/ACT inhaler, Inhale 1 puff into the lungs every 4 (four) hours as needed., Disp: , Rfl:    amantadine (SYMMETREL) 100 MG capsule, Take 2 capsules (200 mg total) by mouth daily., Disp: 180 capsule, Rfl: 1   FLUoxetine  (PROZAC) 20 MG capsule, Take 40 mg by mouth at bedtime., Disp: , Rfl:    fluticasone furoate-vilanterol (BREO ELLIPTA) 100-25 MCG/ACT AEPB, Inhale 1 puff into the lungs daily., Disp: , Rfl:    gabapentin (NEURONTIN) 300 MG capsule, Take 1 capsule at dinner and 3 capsules at bedtime., Disp: 360 capsule, Rfl: 1   hydrochlorothiazide (HYDRODIURIL) 12.5 MG tablet, Take 12.5 mg by mouth daily. , Disp: , Rfl:    levothyroxine (SYNTHROID) 137 MCG tablet, Take 137 mcg by mouth daily before breakfast., Disp: , Rfl:    Menthol, Topical Analgesic, (BIOFREEZE) 4 % GEL, Apply 1 application. topically daily as needed (pain)., Disp: , Rfl:    rOPINIRole (REQUIP) 3 MG tablet, TAKE 1 TABLET BY MOUTH AT [facility] AND AT BEDTIME AS NEEDED (Patient taking differently: Take 3 mg by mouth at bedtime.), Disp: 60 tablet, Rfl: 3   rosuvastatin (CRESTOR) 10 MG tablet, Take 10 mg by mouth daily., Disp: , Rfl:    Vitamin D, Ergocalciferol, (DRISDOL) 1.25 MG (50000 UNIT) CAPS capsule, Take 50,000 Units by mouth 2 (two) times a week. Tues, Thur, Disp: , Rfl:

## 2022-04-27 NOTE — Patient Instructions (Signed)
Continue on breo ellipta 1 puff daily for 3 months - rinse mouth out after each use  In July you can try to come off the inhaler, monitor for return o symptoms  Continue albuterol inhaler as needed.  Consider a magnesium supplement for your leg cramps.   Follow up in 6 months

## 2022-05-13 DIAGNOSIS — M25561 Pain in right knee: Secondary | ICD-10-CM | POA: Diagnosis not present

## 2022-05-13 DIAGNOSIS — M25562 Pain in left knee: Secondary | ICD-10-CM | POA: Diagnosis not present

## 2022-05-13 DIAGNOSIS — G8929 Other chronic pain: Secondary | ICD-10-CM | POA: Diagnosis not present

## 2022-05-31 DIAGNOSIS — L7211 Pilar cyst: Secondary | ICD-10-CM | POA: Diagnosis not present

## 2022-05-31 DIAGNOSIS — L821 Other seborrheic keratosis: Secondary | ICD-10-CM | POA: Diagnosis not present

## 2022-05-31 DIAGNOSIS — D2239 Melanocytic nevi of other parts of face: Secondary | ICD-10-CM | POA: Diagnosis not present

## 2022-05-31 DIAGNOSIS — L820 Inflamed seborrheic keratosis: Secondary | ICD-10-CM | POA: Diagnosis not present

## 2022-05-31 DIAGNOSIS — L538 Other specified erythematous conditions: Secondary | ICD-10-CM | POA: Diagnosis not present

## 2022-05-31 DIAGNOSIS — L918 Other hypertrophic disorders of the skin: Secondary | ICD-10-CM | POA: Diagnosis not present

## 2022-05-31 DIAGNOSIS — L814 Other melanin hyperpigmentation: Secondary | ICD-10-CM | POA: Diagnosis not present

## 2022-06-24 ENCOUNTER — Other Ambulatory Visit: Payer: Self-pay | Admitting: Neurology

## 2022-07-29 DIAGNOSIS — M5416 Radiculopathy, lumbar region: Secondary | ICD-10-CM | POA: Diagnosis not present

## 2022-07-29 DIAGNOSIS — M48062 Spinal stenosis, lumbar region with neurogenic claudication: Secondary | ICD-10-CM | POA: Diagnosis not present

## 2022-08-01 ENCOUNTER — Other Ambulatory Visit: Payer: Self-pay | Admitting: Orthopedic Surgery

## 2022-08-01 DIAGNOSIS — M545 Low back pain, unspecified: Secondary | ICD-10-CM

## 2022-08-12 DIAGNOSIS — M5416 Radiculopathy, lumbar region: Secondary | ICD-10-CM | POA: Diagnosis not present

## 2022-08-19 ENCOUNTER — Ambulatory Visit: Admission: RE | Admit: 2022-08-19 | Payer: PPO | Source: Ambulatory Visit

## 2022-08-19 DIAGNOSIS — M545 Low back pain, unspecified: Secondary | ICD-10-CM

## 2022-08-19 DIAGNOSIS — M47816 Spondylosis without myelopathy or radiculopathy, lumbar region: Secondary | ICD-10-CM | POA: Diagnosis not present

## 2022-08-19 DIAGNOSIS — M48061 Spinal stenosis, lumbar region without neurogenic claudication: Secondary | ICD-10-CM | POA: Diagnosis not present

## 2022-08-26 ENCOUNTER — Ambulatory Visit
Admission: RE | Admit: 2022-08-26 | Discharge: 2022-08-26 | Disposition: A | Discharge status: Home / Self Care | Payer: PPO | Source: Ambulatory Visit | Attending: Orthopedic Surgery | Admitting: Orthopedic Surgery

## 2022-08-26 ENCOUNTER — Other Ambulatory Visit: Payer: Self-pay | Admitting: Orthopedic Surgery

## 2022-08-26 DIAGNOSIS — M47816 Spondylosis without myelopathy or radiculopathy, lumbar region: Secondary | ICD-10-CM | POA: Diagnosis not present

## 2022-08-26 DIAGNOSIS — M545 Low back pain, unspecified: Secondary | ICD-10-CM

## 2022-08-26 DIAGNOSIS — M48061 Spinal stenosis, lumbar region without neurogenic claudication: Secondary | ICD-10-CM | POA: Diagnosis not present

## 2022-08-26 MED ORDER — GADOPICLENOL 0.5 MMOL/ML IV SOLN
10.0000 mL | Freq: Once | INTRAVENOUS | Status: AC | PRN
  Administered 2022-08-26: 10 mL via INTRAVENOUS

## 2022-08-29 DIAGNOSIS — M545 Low back pain, unspecified: Secondary | ICD-10-CM | POA: Diagnosis not present

## 2022-09-01 DIAGNOSIS — M5416 Radiculopathy, lumbar region: Secondary | ICD-10-CM | POA: Diagnosis not present

## 2022-09-09 DIAGNOSIS — I10 Essential (primary) hypertension: Secondary | ICD-10-CM | POA: Diagnosis not present

## 2022-09-09 DIAGNOSIS — E559 Vitamin D deficiency, unspecified: Secondary | ICD-10-CM | POA: Diagnosis not present

## 2022-09-09 DIAGNOSIS — G43909 Migraine, unspecified, not intractable, without status migrainosus: Secondary | ICD-10-CM | POA: Diagnosis not present

## 2022-09-09 DIAGNOSIS — Z6841 Body Mass Index (BMI) 40.0 and over, adult: Secondary | ICD-10-CM | POA: Diagnosis not present

## 2022-09-09 DIAGNOSIS — G4733 Obstructive sleep apnea (adult) (pediatric): Secondary | ICD-10-CM | POA: Diagnosis not present

## 2022-09-09 DIAGNOSIS — R7303 Prediabetes: Secondary | ICD-10-CM | POA: Diagnosis not present

## 2022-09-09 DIAGNOSIS — F339 Major depressive disorder, recurrent, unspecified: Secondary | ICD-10-CM | POA: Diagnosis not present

## 2022-09-09 DIAGNOSIS — E039 Hypothyroidism, unspecified: Secondary | ICD-10-CM | POA: Diagnosis not present

## 2022-09-09 DIAGNOSIS — G2581 Restless legs syndrome: Secondary | ICD-10-CM | POA: Diagnosis not present

## 2022-09-09 DIAGNOSIS — E785 Hyperlipidemia, unspecified: Secondary | ICD-10-CM | POA: Diagnosis not present

## 2022-09-09 DIAGNOSIS — G4721 Circadian rhythm sleep disorder, delayed sleep phase type: Secondary | ICD-10-CM | POA: Diagnosis not present

## 2022-09-12 DIAGNOSIS — M6281 Muscle weakness (generalized): Secondary | ICD-10-CM | POA: Diagnosis not present

## 2022-09-12 DIAGNOSIS — M48061 Spinal stenosis, lumbar region without neurogenic claudication: Secondary | ICD-10-CM | POA: Diagnosis not present

## 2022-09-15 DIAGNOSIS — M25532 Pain in left wrist: Secondary | ICD-10-CM | POA: Diagnosis not present

## 2022-09-16 DIAGNOSIS — M6281 Muscle weakness (generalized): Secondary | ICD-10-CM | POA: Diagnosis not present

## 2022-09-16 DIAGNOSIS — M48061 Spinal stenosis, lumbar region without neurogenic claudication: Secondary | ICD-10-CM | POA: Diagnosis not present

## 2022-09-19 DIAGNOSIS — M48061 Spinal stenosis, lumbar region without neurogenic claudication: Secondary | ICD-10-CM | POA: Diagnosis not present

## 2022-09-19 DIAGNOSIS — M6281 Muscle weakness (generalized): Secondary | ICD-10-CM | POA: Diagnosis not present

## 2022-09-22 DIAGNOSIS — M48061 Spinal stenosis, lumbar region without neurogenic claudication: Secondary | ICD-10-CM | POA: Diagnosis not present

## 2022-09-22 DIAGNOSIS — M6281 Muscle weakness (generalized): Secondary | ICD-10-CM | POA: Diagnosis not present

## 2022-10-06 DIAGNOSIS — M48061 Spinal stenosis, lumbar region without neurogenic claudication: Secondary | ICD-10-CM | POA: Diagnosis not present

## 2022-10-06 DIAGNOSIS — M6281 Muscle weakness (generalized): Secondary | ICD-10-CM | POA: Diagnosis not present

## 2022-10-11 DIAGNOSIS — M48061 Spinal stenosis, lumbar region without neurogenic claudication: Secondary | ICD-10-CM | POA: Diagnosis not present

## 2022-10-11 DIAGNOSIS — M6281 Muscle weakness (generalized): Secondary | ICD-10-CM | POA: Diagnosis not present

## 2022-10-15 ENCOUNTER — Other Ambulatory Visit: Payer: Self-pay | Admitting: Neurology

## 2022-10-20 DIAGNOSIS — M48061 Spinal stenosis, lumbar region without neurogenic claudication: Secondary | ICD-10-CM | POA: Diagnosis not present

## 2022-10-20 DIAGNOSIS — M6281 Muscle weakness (generalized): Secondary | ICD-10-CM | POA: Diagnosis not present

## 2022-10-21 DIAGNOSIS — M5416 Radiculopathy, lumbar region: Secondary | ICD-10-CM | POA: Diagnosis not present

## 2022-10-24 ENCOUNTER — Emergency Department (HOSPITAL_COMMUNITY)
Admission: EM | Admit: 2022-10-24 | Discharge: 2022-10-24 | Disposition: A | Discharge status: Home / Self Care | Payer: PPO | Attending: Emergency Medicine | Admitting: Emergency Medicine

## 2022-10-24 ENCOUNTER — Encounter (HOSPITAL_COMMUNITY): Payer: Self-pay

## 2022-10-24 ENCOUNTER — Other Ambulatory Visit: Payer: Self-pay

## 2022-10-24 DIAGNOSIS — J029 Acute pharyngitis, unspecified: Secondary | ICD-10-CM | POA: Diagnosis not present

## 2022-10-24 DIAGNOSIS — R221 Localized swelling, mass and lump, neck: Secondary | ICD-10-CM | POA: Insufficient documentation

## 2022-10-24 LAB — GROUP A STREP BY PCR: Group A Strep by PCR: NOT DETECTED

## 2022-10-24 MED ORDER — DEXAMETHASONE 4 MG PO TABS
10.0000 mg | ORAL_TABLET | Freq: Once | ORAL | Status: AC
  Administered 2022-10-24: 10 mg via ORAL
  Filled 2022-10-24: qty 3

## 2022-10-24 MED ORDER — AMOXICILLIN 500 MG PO CAPS: 1000.0000 mg | ORAL_CAPSULE | Freq: Two times a day (BID) | ORAL | 0 refills | Status: DC

## 2022-10-24 MED ORDER — AMOXICILLIN 500 MG PO CAPS
1000.0000 mg | ORAL_CAPSULE | Freq: Once | ORAL | Status: AC
  Administered 2022-10-24: 1000 mg via ORAL
  Filled 2022-10-24: qty 2

## 2022-10-24 NOTE — Discharge Instructions (Addendum)
Please return for worsening symptoms, inability to swallow.   Pain with rotating your neck

## 2022-10-24 NOTE — ED Triage Notes (Addendum)
Pt c/o throat feeling like it's closing up about an hour ago upon waking. Pt c/o dysphagia. Pt able to maintain oral secretions. Pt is eupneic. The back of pt's throat is erythematous. Pt is whispering when she talks.

## 2022-10-24 NOTE — ED Provider Notes (Signed)
[place] EMERGENCY DEPARTMENT AT [facility] Provider Note   CSN: [id number] Arrival date & time: 10/24/22  4098     History  Chief Complaint  Patient presents with   Oral Swelling    Anna Walker is a 67 y.o. female.  67 yo F with a chief complaint of a sore throat.  Started this morning upon waking up.  Felt like she had a bit of swelling back there.  No cough or congestion.  No fevers.        Home Medications Prior to Admission medications   Medication Sig Start Date End Date Taking? Authorizing Provider  amoxicillin (AMOXIL) 500 MG capsule Take 2 capsules (1,000 mg total) by mouth 2 (two) times daily. 10/24/22  Yes Melene Plan, DO  albuterol (VENTOLIN HFA) 108 (90 Base) MCG/ACT inhaler Inhale 1 puff into the lungs every 4 (four) hours as needed. 02/14/22   [provider]  amantadine (SYMMETREL) 100 MG capsule Take 2 capsules (200 mg total) by mouth daily. 04/01/22   Drema Dallas, DO  FLUoxetine (PROZAC) 20 MG capsule Take 40 mg by mouth at bedtime. 06/21/18   [provider]  fluticasone furoate-vilanterol (BREO ELLIPTA) 100-25 MCG/ACT AEPB Inhale 1 puff into the lungs daily.    [provider]  gabapentin (NEURONTIN) 300 MG capsule Take 1 capsule at dinner and 3 capsules at bedtime. 03/31/22   Drema Dallas, DO  hydrochlorothiazide (HYDRODIURIL) 12.5 MG tablet Take 12.5 mg by mouth daily.  02/06/17   [provider]  levothyroxine (SYNTHROID) 137 MCG tablet Take 137 mcg by mouth daily before breakfast.    [provider]  Menthol, Topical Analgesic, (BIOFREEZE) 4 % GEL Apply 1 application. topically daily as needed (pain).    [provider]  rOPINIRole (REQUIP) 3 MG tablet TAKE 1 TABLET BY MOUTH AT [facility] AND AT BEDTIME AS NEEDED 06/24/22   Shon Millet R, DO  rosuvastatin (CRESTOR) 10 MG tablet Take 10 mg by mouth daily. 03/14/22   [provider]  Vitamin D, Ergocalciferol, (DRISDOL) 1.25 MG (50000  UNIT) CAPS capsule Take 50,000 Units by mouth 2 (two) times a week. Tues, Thur    [provider]      Allergies    Adderall [amphetamine-dextroamphetamine] and Topamax [topiramate]    Review of Systems   Review of Systems  Physical Exam Updated Vital Signs BP (!) 149/87 (BP Location: Right Arm)   Pulse 88   Temp 98.3 F (36.8 C) (Oral)   Resp 16   Ht 5\' 6"  (1.676 m)   Wt 120.2 kg   SpO2 95%   BMI 42.77 kg/m  Physical Exam Vitals and nursing note reviewed.  Constitutional:      General: She is not in acute distress.    Appearance: She is well-developed. She is not diaphoretic.  HENT:     Head: Normocephalic and atraumatic.     Mouth/Throat:     Comments: Posterior oropharyngeal erythema without any obvious edema.  Uvula is midline.  No sublingual swelling.  Tolerating secretions without difficulty.  Able to rotate her head without pain. Eyes:     Pupils: Pupils are equal, round, and reactive to light.  Cardiovascular:     Rate and Rhythm: Normal rate and regular rhythm.     Heart sounds: No murmur heard.    No friction rub. No gallop.  Pulmonary:     Effort: Pulmonary effort is normal.     Breath sounds: No wheezing  or rales.  Abdominal:     General: There is no distension.     Palpations: Abdomen is soft.     Tenderness: There is no abdominal tenderness.  Musculoskeletal:        General: No tenderness.     Cervical back: Normal range of motion and neck supple.  Skin:    General: Skin is warm and dry.  Neurological:     Mental Status: She is alert and oriented to person, place, and time.  Psychiatric:        Behavior: Behavior normal.     ED Results / Procedures / Treatments   Labs (all labs ordered are listed, but only abnormal results are displayed) Labs Reviewed  GROUP A STREP BY PCR    EKG None  Radiology No results found.  Procedures Procedures    Medications Ordered in ED Medications  dexamethasone (DECADRON) tablet 10 mg (10 mg  Oral Given 10/24/22 0934)  amoxicillin (AMOXIL) capsule 1,000 mg (1,000 mg Oral Given 10/24/22 7829)    ED Course/ Medical Decision Making/ A&P                                 Medical Decision Making Risk Prescription drug management.   67 yo F with a chief complaint of a sore throat.  This has been going on for just about an hour.  She felt like her throat was really swollen.  She has no trismus no sublingual swelling, she is not in the sniffing position.  Feels unlikely that she has a peritonsillar abscess retropharyngeal abscess or epiglottitis.  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  Give a dose of Decadron here.  PCP follow-up.  9:51 AM:  I have discussed the diagnosis/risks/treatment options with the patient.  Evaluation and diagnostic testing in the emergency department does not suggest an emergent condition requiring admission or immediate intervention beyond what has been performed at this time.  They will follow up with PCP. We also discussed returning to the ED immediately if new or worsening sx occur. We discussed the sx which are most concerning (e.g., sudden worsening pain, fever, inability to tolerate by mouth) that necessitate immediate return. Medications administered to the patient during their visit and any new prescriptions provided to the patient are listed below.  Medications given during this visit Medications  dexamethasone (DECADRON) tablet 10 mg (10 mg Oral Given 10/24/22 0934)  amoxicillin (AMOXIL) capsule 1,000 mg (1,000 mg Oral Given 10/24/22 0934)     The patient appears reasonably screen and/or stabilized for discharge and I doubt any other medical condition or other [facility] requiring further screening, evaluation, or treatment in the ED at this time prior to discharge.          Final Clinical Impression(s) / ED  Diagnoses Final diagnoses:  Throat swelling    Rx / DC Orders ED Discharge Orders          Ordered    amoxicillin (AMOXIL) 500 MG capsule  2 times daily        10/24/22 0949              Melene Plan, DO 10/24/22 [phone]

## 2022-10-27 ENCOUNTER — Telehealth: Payer: Self-pay | Admitting: Neurology

## 2022-10-27 DIAGNOSIS — M6281 Muscle weakness (generalized): Secondary | ICD-10-CM | POA: Diagnosis not present

## 2022-10-27 DIAGNOSIS — M48061 Spinal stenosis, lumbar region without neurogenic claudication: Secondary | ICD-10-CM | POA: Diagnosis not present

## 2022-10-27 NOTE — Telephone Encounter (Signed)
Caller stated pharmacy denied her script for amantadine (SYMMETREL) 100 MG capsule [[id number]] and was told to reach out to [place].

## 2022-10-28 ENCOUNTER — Other Ambulatory Visit: Payer: Self-pay | Admitting: Neurology

## 2022-10-28 DIAGNOSIS — R499 Unspecified voice and resonance disorder: Secondary | ICD-10-CM | POA: Diagnosis not present

## 2022-10-28 DIAGNOSIS — T17308A Unspecified foreign body in larynx causing other injury, initial encounter: Secondary | ICD-10-CM | POA: Diagnosis not present

## 2022-10-28 MED ORDER — AMANTADINE HCL 100 MG PO CAPS: 200.0000 mg | ORAL_CAPSULE | Freq: Every day | ORAL | 0 refills | Status: DC

## 2022-10-28 NOTE — Telephone Encounter (Signed)
Pt called in stating the amantadine was sent to the incorrect pharmacy. [facility]. [facility] [address]

## 2022-11-01 DIAGNOSIS — M6281 Muscle weakness (generalized): Secondary | ICD-10-CM | POA: Diagnosis not present

## 2022-11-01 DIAGNOSIS — M48061 Spinal stenosis, lumbar region without neurogenic claudication: Secondary | ICD-10-CM | POA: Diagnosis not present

## 2022-11-03 DIAGNOSIS — M6281 Muscle weakness (generalized): Secondary | ICD-10-CM | POA: Diagnosis not present

## 2022-11-03 DIAGNOSIS — M48061 Spinal stenosis, lumbar region without neurogenic claudication: Secondary | ICD-10-CM | POA: Diagnosis not present

## 2022-11-08 DIAGNOSIS — M6281 Muscle weakness (generalized): Secondary | ICD-10-CM | POA: Diagnosis not present

## 2022-11-08 DIAGNOSIS — M48061 Spinal stenosis, lumbar region without neurogenic claudication: Secondary | ICD-10-CM | POA: Diagnosis not present

## 2022-11-10 DIAGNOSIS — M48061 Spinal stenosis, lumbar region without neurogenic claudication: Secondary | ICD-10-CM | POA: Diagnosis not present

## 2022-11-10 DIAGNOSIS — M6281 Muscle weakness (generalized): Secondary | ICD-10-CM | POA: Diagnosis not present

## 2022-11-15 DIAGNOSIS — M48061 Spinal stenosis, lumbar region without neurogenic claudication: Secondary | ICD-10-CM | POA: Diagnosis not present

## 2022-11-15 DIAGNOSIS — M6281 Muscle weakness (generalized): Secondary | ICD-10-CM | POA: Diagnosis not present

## 2022-11-17 DIAGNOSIS — M25561 Pain in right knee: Secondary | ICD-10-CM | POA: Diagnosis not present

## 2022-11-17 DIAGNOSIS — G8929 Other chronic pain: Secondary | ICD-10-CM | POA: Diagnosis not present

## 2022-11-17 DIAGNOSIS — M25562 Pain in left knee: Secondary | ICD-10-CM | POA: Diagnosis not present

## 2022-11-22 ENCOUNTER — Emergency Department (HOSPITAL_COMMUNITY)
Admission: EM | Admit: 2022-11-22 | Discharge: 2022-11-23 | Disposition: A | Discharge status: Home / Self Care | Payer: PPO | Attending: Emergency Medicine | Admitting: Emergency Medicine

## 2022-11-22 ENCOUNTER — Other Ambulatory Visit: Payer: Self-pay

## 2022-11-22 ENCOUNTER — Emergency Department (HOSPITAL_COMMUNITY): Payer: PPO

## 2022-11-22 DIAGNOSIS — R930 Abnormal findings on diagnostic imaging of skull and head, not elsewhere classified: Secondary | ICD-10-CM | POA: Diagnosis not present

## 2022-11-22 DIAGNOSIS — I10 Essential (primary) hypertension: Secondary | ICD-10-CM | POA: Diagnosis not present

## 2022-11-22 DIAGNOSIS — Z79899 Other long term (current) drug therapy: Secondary | ICD-10-CM | POA: Insufficient documentation

## 2022-11-22 DIAGNOSIS — H538 Other visual disturbances: Secondary | ICD-10-CM | POA: Diagnosis not present

## 2022-11-22 DIAGNOSIS — R410 Disorientation, unspecified: Secondary | ICD-10-CM | POA: Insufficient documentation

## 2022-11-22 DIAGNOSIS — H532 Diplopia: Secondary | ICD-10-CM | POA: Diagnosis not present

## 2022-11-22 LAB — CBC
HCT: 42 % (ref 36.0–46.0)
Hemoglobin: 13.5 g/dL (ref 12.0–15.0)
MCH: 28 pg (ref 26.0–34.0)
MCHC: 32.1 g/dL (ref 30.0–36.0)
MCV: 87.1 fL (ref 80.0–100.0)
Platelets: 288 10*3/uL (ref 150–400)
RBC: 4.82 MIL/uL (ref 3.87–5.11)
RDW: 13.9 % (ref 11.5–15.5)
WBC: 9.2 10*3/uL (ref 4.0–10.5)
nRBC: 0 % (ref 0.0–0.2)

## 2022-11-22 LAB — BASIC METABOLIC PANEL
Anion gap: 10 (ref 5–15)
BUN: 21 mg/dL (ref 8–23)
CO2: 27 mmol/L (ref 22–32)
Calcium: 9.4 mg/dL (ref 8.9–10.3)
Chloride: 103 mmol/L (ref 98–111)
Creatinine, Ser: 0.9 mg/dL (ref 0.44–1.00)
GFR, Estimated: >60 mL/min (ref >60)
Glucose, Bld: 99 mg/dL (ref 70–99)
Potassium: 4.1 mmol/L (ref 3.5–5.1)
Sodium: 140 mmol/L (ref 135–145)

## 2022-11-22 LAB — CBG MONITORING, ED: Glucose-Capillary: 81 mg/dL (ref 70–99)

## 2022-11-22 MED ORDER — GADOBUTROL 1 MMOL/ML IV SOLN
10.0000 mL | Freq: Once | INTRAVENOUS | Status: AC | PRN
  Administered 2022-11-22: 10 mL via INTRAVENOUS

## 2022-11-22 NOTE — ED Provider Notes (Signed)
Anna Walker   CSN: [id number] Arrival date & time: 11/22/22  1437     History  Chief Complaint  Patient presents with   Blurred Vision    Anna Walker is a 67 y.o. female.  67 yo F with a cc of sudden onset blurry vision and confusion.  She thinks it lasted for about 2 hours.  Occurred while she was at work at rest.  She has had some blurred vision off and on for the past couple weeks.  She thought it was due to her blood sugar.  Based on her blood sugar documented at home her doctor started on metformin.  She has been struggling with pain to the knees and the back.        Home Medications Prior to Admission medications   Medication Sig Start Date End Date Taking? Authorizing Provider  albuterol (VENTOLIN HFA) 108 (90 Base) MCG/ACT inhaler Inhale 1 puff into the lungs every 4 (four) hours as needed. 02/14/22   [provider]  amantadine (SYMMETREL) 100 MG capsule Take 2 capsules (200 mg total) by mouth daily. 10/28/22   Drema Dallas, DO  amoxicillin (AMOXIL) 500 MG capsule Take 2 capsules (1,000 mg total) by mouth 2 (two) times daily. 10/24/22   Melene Plan, DO  FLUoxetine (PROZAC) 20 MG capsule Take 40 mg by mouth at bedtime. 06/21/18   [provider]  fluticasone furoate-vilanterol (BREO ELLIPTA) 100-25 MCG/ACT AEPB Inhale 1 puff into the lungs daily.    [provider]  gabapentin (NEURONTIN) 300 MG capsule Take 1 capsule at dinner and 3 capsules at bedtime. 03/31/22   Drema Dallas, DO  hydrochlorothiazide (HYDRODIURIL) 12.5 MG tablet Take 12.5 mg by mouth daily.  02/06/17   [provider]  levothyroxine (SYNTHROID) 137 MCG tablet Take 137 mcg by mouth daily before breakfast.    [provider]  Menthol, Topical Analgesic, (BIOFREEZE) 4 % GEL Apply 1 application. topically daily as needed (pain).    [provider]  rOPINIRole (REQUIP) 3 MG tablet TAKE 1 TABLET BY  MOUTH AT [facility] AND AT BEDTIME AS NEEDED 06/24/22   Shon Millet R, DO  rosuvastatin (CRESTOR) 10 MG tablet Take 10 mg by mouth daily. 03/14/22   [provider]  Vitamin D, Ergocalciferol, (DRISDOL) 1.25 MG (50000 UNIT) CAPS capsule Take 50,000 Units by mouth 2 (two) times a week. Tues, Thur    [provider]      Allergies    Adderall [amphetamine-dextroamphetamine] and Topamax [topiramate]    Review of Systems   Review of Systems  Physical Exam Updated Vital Signs BP 120/60   Pulse 73   Temp 97.9 F (36.6 C) (Oral)   Resp 18   Ht 5\' 6"  (1.676 m)   Wt 122.5 kg   SpO2 98%   BMI 43.58 kg/m  Physical Exam Vitals and nursing Walker reviewed.  Constitutional:      General: She is not in acute distress.    Appearance: She is well-developed. She is not diaphoretic.  HENT:     Head: Normocephalic and atraumatic.  Eyes:     Pupils: Pupils are equal, round, and reactive to light.  Cardiovascular:     Rate and Rhythm: Normal rate and regular rhythm.     Heart sounds: No murmur heard.    No friction rub. No gallop.  Pulmonary:     Effort: Pulmonary effort is normal.  Breath sounds: No wheezing or rales.  Abdominal:     General: There is no distension.     Palpations: Abdomen is soft.     Tenderness: There is no abdominal tenderness.  Musculoskeletal:        General: No tenderness.     Cervical back: Normal range of motion and neck supple.  Skin:    General: Skin is warm and dry.  Neurological:     Mental Status: She is alert and oriented to person, place, and time.     Cranial Nerves: Cranial nerves 2-12 are intact.     Sensory: Sensation is intact.     Motor: Motor function is intact.     Coordination: Coordination is intact.  Psychiatric:        Behavior: Behavior normal.     ED Results / Procedures / Treatments   Labs (all labs ordered are listed, but only abnormal results are displayed) Labs Reviewed  BASIC METABOLIC PANEL  CBC  CBG  MONITORING, ED    EKG EKG Interpretation Date/Time:  Tuesday November 22 2022 14:55:36 EDT Ventricular Rate:  80 PR Interval:  131 QRS Duration:  91 QT Interval:  383 QTC Calculation: 442 R Axis:   83  Text Interpretation: Sinus rhythm Borderline right axis deviation Low voltage, precordial leads Borderline T abnormalities, inferior leads No significant change since last tracing Confirmed by Melene Plan [phone]) on 11/22/2022 8:54:49 PM  Radiology No results found.  Procedures Procedures    Medications Ordered in ED Medications  gadobutrol (GADAVIST) 1 MMOL/ML injection 10 mL (10 mLs Intravenous Contrast Given 11/22/22 2232)    ED Course/ Medical Decision Making/ A&P                                 Medical Decision Making Amount and/or Complexity of Data Reviewed Labs: ordered. Radiology: ordered.  Risk Prescription drug management.   67 yo F with chief complaint of blurry vision.  Since then she has had this off and on for a week or so.  She had called her family doctor about it and based on her home checks blood sugars they decide to start her on metformin.  She was at work today and had an episode where she felt like both of her eyes get blurry and she had trouble figuring out what she was doing.  She thinks this lasted for couple hours but is completely resolved now.  She has had some very mild headaches off and on.  No trauma.  No one-sided numbness or weakness no difficulty with speech or swallowing.   She tells me she has a remote history of having a demyelinating lesion.  Will obtain MRI brain.  With nonspecific symptoms that  have completely resolved doubt stroke/tia.  Would have her follow up with her neurologist who she sees regularly.    Patient signed out to Dr. Manus Gunning, please see their Walker for further details of care in the ED.  The patients results and plan were reviewed and discussed.   Any x-rays performed were independently reviewed by myself.    Differential diagnosis were considered with the presenting HPI.  Medications  gadobutrol (GADAVIST) 1 MMOL/ML injection 10 mL (10 mLs Intravenous Contrast Given 11/22/22 2232)    Vitals:   11/22/22 1449 11/22/22 2100 11/22/22 2106  BP: 135/81 120/60   Pulse: 80 73   Resp: 16 18   Temp: 98.1 F (36.7 C)  97.9 F (36.6 C)   TempSrc:  Oral   SpO2: 100% 98%   Weight:   122.5 kg  Height:   5\' 6"  (1.676 m)    Final diagnoses:  Blurred vision  Confusion          Final Clinical Impression(s) / ED Diagnoses Final diagnoses:  Blurred vision  Confusion    Rx / DC Orders ED Discharge Orders     None         Melene Plan, DO 11/22/22 2319

## 2022-11-22 NOTE — ED Triage Notes (Signed)
Pt here with reports of having blurry vision bilaterally along with difficulty concentrating. No unilateral weakness noted.

## 2022-11-22 NOTE — Discharge Instructions (Addendum)
Please follow up with your neurologist in the office.

## 2022-11-22 NOTE — ED Notes (Signed)
Pts daughter reports pt experienced blurred vision, confusion, and unsteadiness that started around 1300-1330, but resolved a few hours ago. Pt suspected it could be related to blood sugar and recently started metformin.

## 2022-11-23 NOTE — ED Provider Notes (Signed)
Assumed care from Dr. Adela Lank.  Patient with a history of previous optic neuritis in her right eye with chronic blurry vision here with episode of worsening blurry vision bilaterally that resolves after a few hours.  Back to of her baseline blurry vision right eye now.  No focal weakness, numbness or tingling.  No difficulty speaking or difficulty swallowing.  Felt some confusion at times.  Pending MRI.  MRI is negative for acute finding.  D/w Dr. Otelia Limes of neurology. He agrees with outpatient follow-up with neurology and ophthalmology.  Low suspicion for TIA given bilateral nature of visual changes and back to baseline now.  She has followup with ophthalmology in 2 days. She will also followup with her neurologist Dr. Everlena Cooper.   She is anxious to go home.  Return to the ED with new or worsening symptoms.   Glynn Octave, MD 11/23/22 0230

## 2022-11-24 DIAGNOSIS — E119 Type 2 diabetes mellitus without complications: Secondary | ICD-10-CM | POA: Diagnosis not present

## 2022-11-30 DIAGNOSIS — H538 Other visual disturbances: Secondary | ICD-10-CM | POA: Diagnosis not present

## 2022-11-30 DIAGNOSIS — R7303 Prediabetes: Secondary | ICD-10-CM | POA: Diagnosis not present

## 2022-11-30 NOTE — Progress Notes (Unsigned)
NEUROLOGY FOLLOW UP OFFICE NOTE  Anna Walker [id number]  Assessment/Plan:   1.  Transient blurred vision/memory deficits, unclear etiology - consider transient elevated blood pressure or change in serum glucose  2.  Migraine with aura, without status migrainosus, not intractable 3.  Recurrent symptoms of right-sided heaviness/numbness, likely complicated migraine.  Based on testing performed over the years, she does not have multiple sclerosis.  Repeat brain MRIs over the years have remained stable and unremarkable.  CSF analysis revealed 1 oligoclonal band, which is not a significant number.  4.  History of right optic neuritis - still with some residual vision loss of right eye. 5.  Restless leg syndrome 6.  Nocturnal leg cramps   1.  Nocturnal leg cramps:  igabapentin 300mg   in evening and 300-600mg  at bedtime.   2.  Migraine prevention:  gabapentin 300mg  at dinner and 300-600mg  at bedtime 3.  RLS:  ropinirole 3mg  at bedtime, gabapentin 300mg  at dinner and 300-600mg  at bedtime 4.  Limit use of pain relievers to no more than 2 days out of week to prevent risk of rebound or medication-overuse headache. 5.  Keep headache diary 6.  Follow up 1 year   Subjective:  Anna Walker is a 67 year old woman with ADD, fatigue, hypertension, restless leg syndrome, migraines, hyperlipidemia, type 2 diabetes mellitus, OSA, hypothyroidism and history of right optic neuritis who follows up for migraines and RLS.Marland Kitchen   UPDATE: Since early last month, she has been having recurrent episodes of bilateral blurred vision lasting a couple of hours.  Also felt confused.  Couldn't perform simple tasks such as working on the computer.  Usually no associated headache (maybe a slight one on a couple of episodes.  No unilateral numbness or weakness.  Seen in the ED on 10/29.  MRI of brain with and without contrast personally reviewed was unremarkable.  Followed up with optometry who didn't find anything  abnormal except for dry eye.  Unclear if related to her blood sugars (70-150).  Has not checked blood pressure.  She cut back on the gabapentin back to 300mg  in evening and 300-600mg  at bedtime (from 300mg  in evening and 900mg  at bedtime).    Migraines are controlled.  She has not had one since last visit.  Restless leg is overall stable.  However she continues to have nocturnal leg cramps that wakes her up at night.  Takes gabapentin 600mg  at bedtime and ropinirole 3mg  at dinner and at bedtime.  If needed, she takes 300mg  gabapentin earlier in the evening.   Current NSAIDS:  ASA 81mg  daily Current analgesics:  no Current triptans:  sumatriptan 100mg  Current anti-emetic:  no Current muscle relaxants:  no Current anti-anxiolytic:  no Current sleep aide:  no Current Antihypertensive medications:  HCTZ Current Antidepressant medications:  fluoxetine 30mg  Current Anticonvulsant medications:  gabapentin 300mg  evening PRN and 600mg  at bedtime Other medications:  Ropinirole 3mg  dinner and 3mg  bedtime, amantadine 100mg  three times daily (fatigue)    HISTORY: I RIGHT OPTIC NEURITIS AND OTHER RECURRENT SYMPTOMS: She had an episode of right optic neuritis in 1998, presenting first as blurred vision and then complete vision loss.   A couple of weeks later, she developed right sided numbness of the face, arm and leg.  She underwent a lumbar puncture which demonstrated one oligoclonal band.  MRI of brain reportedly may have shown foci which may be concerning for MS.  MRI cervical spine reportedly did not demonstrate demyelinating lesions.  She was diagnosed with multiple sclerosis  and treated with IV steroids.  The vision loss lasted 7-8 weeks.  The numbness lasted several days.  She was treated with DMT for several years.  She started on Betaseron but was then switched to Copaxone and then Avonex due to intolerability.  She had a repeat LP in 2002 which showed no oligoclonal bands and normal IgG index of 0.6.   Visual evoked potential in 2013 showed absent right response and normal left response.  Repeat MRI from July 2014 was thought to be essentially normal so Avonex was discontinued.  Repeat MRI in November 2014 was unchanged.  Therefore, it was believed that she did not have MS.  NMO-IgG antibodies from 02/24/14 were negative.  MRI of cervical spine without contrast from 03/24/16 was personally reviewed and revealed cervical spondylosis but no abnormal cord signal.   Since the episode in 1998, she endorses subtle weakness on the right side of her body.  For example, when she starts to write, her handwriting will start to get worse.  She also reports short-term memory deficits since 1998.  Over the years, she has had recurrent episodes of right eye pain with right sided numbness.  In the past, they were thought to be MS flares which were treated with steroids.  They occur when she is heated or fatigued.  They typically occur at least once a year and they last about 2 days.  She last had an episode in early January 2019, for which she was evaluated in the ED on 01/30/17.  She had an MRI of the brain and orbits with and without contrast, which was personally reviewed and demonstrated asymmetrically small right optic nerve compatible with sequelae of prior optic neuritis but no enhancement, as well as three nonspecific tiny hyperintense T2/FLAIR foci in the bifrontal periventricular white matter.  All of her symptoms have not progressed over the years and recurrence of these episodes have not increased since discontinuation of Avonex.   She was seen in the ED on 08/27/18 with 1 day of bilateral blurred vision, of gradual onset. It was monocular in either eye.  She first noticed it because she had trouble driving.  She had some right eye ache and dizziness but no headache or new numbness and tingling   MRI of brain/orbits and cervical spine with and without contrast appeared stable and showed no active demyelinating lesions or  abnormal signal of optic nerves.  She was diagnosed by in-house neurology with migraine and was treated with migraine cocktail of toradol and Reglan.  She doesn't have vertigo but she feels "insecure" and problems with balance when ambulating.  Blurred vision was unchanged.  She followed up with her ophthalmologist, Dr. Nile Riggs.  Eye exam was unremarkable except for early cataracts.    She has pain in the legs and gait issues.  She does have arthritis in the knees, which required surgery and had a subsequent DVT.  She is treated for restless leg syndrome, for which she takes ropinirole and gabapentin.  She has chronic burning in the hands.   She also reports chronic fatigue.  She has OSA and is on CPAP.  She takes amantadine.   II MIGRAINES She has history of migraines since early adulthood.  They are severe bifrontal pain associated with nausea, photophobia and phonophobia but no vomiting, new visual disturbance or unilateral numbness or weakness.  They last 1 to 2 days and usually occur once a year.  There are no specific triggers or relieving factors however eye gets  more blurred when fatigued or with drop in blood sugar.    She also reports another migraine described as severe sharp right retro-orbital pain in September 2019.  One one occasion, pain radiated into right ear and posterior neck.  Constant but fluctuates.  There is associated blurred vision in right eye, dizziness, nausea, photophobia and phonophobia, eye twitching and fatigue.  No associated right sided numbness and weakness.     Past medication:  topiramate  PAST MEDICAL HISTORY: Past Medical History:  Diagnosis Date   ADD (attention deficit disorder)    Anxiety    Biliary colic    Bladder leak    Diabetes mellitus without complication (HCC)    type 2 , diet controlled patient not taking any medications   Diverticular disease    DVT (deep venous thrombosis) (HCC)    left leg , resolved after treatment of blood thinners     Edema of both lower extremities    GERD (gastroesophageal reflux disease)    causes coughing    Glaucoma    Heart murmur    High cholesterol    Hypertension    Hypothyroid    Leg cramps    "in my thighs"    Leg cramps    Migraines    Multiple sclerosis (HCC)    per patient " that ha sbeen ruled out , they though it was that initially but it did not preogress like it"   Optic neuritis    lesion on right optic nerve; right eye only , reduced vision and blurriness , loss of depth perception, glasses do not help when it flares     OSA (obstructive sleep apnea)    nightly cpap    Paresthesias/numbness    fingers and toes    Pneumonia 2017   Restless leg syndrome    Sleep apnea    SOB (shortness of breath)    Thyroid disease    Hypothyroidism    MEDICATIONS: Current Outpatient Medications on File Prior to Visit  Medication Sig Dispense Refill   albuterol (VENTOLIN HFA) 108 (90 Base) MCG/ACT inhaler Inhale 1 puff into the lungs every 4 (four) hours as needed.     amantadine (SYMMETREL) 100 MG capsule Take 2 capsules (200 mg total) by mouth daily. 180 capsule 0   amoxicillin (AMOXIL) 500 MG capsule Take 2 capsules (1,000 mg total) by mouth 2 (two) times daily. 40 capsule 0   FLUoxetine (PROZAC) 20 MG capsule Take 40 mg by mouth at bedtime.     fluticasone furoate-vilanterol (BREO ELLIPTA) 100-25 MCG/ACT AEPB Inhale 1 puff into the lungs daily.     gabapentin (NEURONTIN) 300 MG capsule Take 1 capsule at dinner and 3 capsules at bedtime. 360 capsule 1   hydrochlorothiazide (HYDRODIURIL) 12.5 MG tablet Take 12.5 mg by mouth daily.      levothyroxine (SYNTHROID) 137 MCG tablet Take 137 mcg by mouth daily before breakfast.     Menthol, Topical Analgesic, (BIOFREEZE) 4 % GEL Apply 1 application. topically daily as needed (pain).     rOPINIRole (REQUIP) 3 MG tablet TAKE 1 TABLET BY MOUTH AT DINNER AND AT BEDTIME AS NEEDED 180 tablet 1   rosuvastatin (CRESTOR) 10 MG tablet Take 10 mg by  mouth daily.     Vitamin D, Ergocalciferol, (DRISDOL) 1.25 MG (50000 UNIT) CAPS capsule Take 50,000 Units by mouth 2 (two) times a week. Tues, Thur     No current facility-administered medications on file prior to visit.    ALLERGIES:  Allergies  Allergen Reactions   Adderall [Amphetamine-Dextroamphetamine] Other (See Comments)    Caused elevated BP   Topamax [Topiramate]     dizzy    FAMILY HISTORY: Family History  Problem Relation Age of Onset   Barrett's esophagus Mother    Hypertension Mother    Obesity Mother    Luiz Blare' disease Sister    Rheum arthritis Sister    Heart attack Father 76   Sudden death Father    Coronary artery disease Neg Hx       Objective:  Blood pressure (!) 144/79, pulse 90, height 5\' 6"  (1.676 m), weight 271 lb 9.6 oz (123.2 kg), SpO2 96%. General: No acute distress.  Patient appears well-groomed.   Head:  Normocephalic/atraumatic Eyes:  Fundi examined but not visualized Neck: supple, paraspinal tenderness, full range of motion Heart:  Regular rate and rhythm Neurological Exam: alert and oriented.  Speech fluent and not dysarthric, language intact.  Peripheral vision loss in right eye.  No APD.  Some dull sensation of face bilaterally, otherwise CN II-XII intact. Bulk and tone normal, muscle strength 5/5 throughout.  Sensation to pinprick slightly reduced in fingers bilaterally and asymmetrically in toes bilaterally.  Vibratory sensation intact  Deep tendon reflexes 2+ throughout, toes downgoing.  Finger to nose testing intact.  Gait antalgic. Romberg negative.   Shon Millet, DO  CC: Irven Coe, MD

## 2022-12-01 ENCOUNTER — Encounter: Payer: Self-pay | Admitting: Neurology

## 2022-12-01 ENCOUNTER — Ambulatory Visit: Payer: PPO | Admitting: Neurology

## 2022-12-01 VITALS — BP 144/79 | HR 90 | Ht 66.0 in | Wt 271.6 lb

## 2022-12-01 DIAGNOSIS — H538 Other visual disturbances: Secondary | ICD-10-CM

## 2022-12-01 DIAGNOSIS — G43109 Migraine with aura, not intractable, without status migrainosus: Secondary | ICD-10-CM

## 2022-12-01 DIAGNOSIS — Z8669 Personal history of other diseases of the nervous system and sense organs: Secondary | ICD-10-CM

## 2022-12-01 DIAGNOSIS — G2581 Restless legs syndrome: Secondary | ICD-10-CM | POA: Diagnosis not present

## 2022-12-01 DIAGNOSIS — G4762 Sleep related leg cramps: Secondary | ICD-10-CM

## 2022-12-01 NOTE — Patient Instructions (Signed)
Continue gabapentin 300mg  in evening and 300-600mg  at bedtime Requip

## 2022-12-08 DIAGNOSIS — M5416 Radiculopathy, lumbar region: Secondary | ICD-10-CM | POA: Diagnosis not present

## 2022-12-14 DIAGNOSIS — H538 Other visual disturbances: Secondary | ICD-10-CM | POA: Diagnosis not present

## 2022-12-14 DIAGNOSIS — H469 Unspecified optic neuritis: Secondary | ICD-10-CM | POA: Diagnosis not present

## 2022-12-14 DIAGNOSIS — E162 Hypoglycemia, unspecified: Secondary | ICD-10-CM | POA: Diagnosis not present

## 2022-12-14 DIAGNOSIS — R7303 Prediabetes: Secondary | ICD-10-CM | POA: Diagnosis not present

## 2022-12-20 DIAGNOSIS — R7303 Prediabetes: Secondary | ICD-10-CM | POA: Diagnosis not present

## 2022-12-20 DIAGNOSIS — E162 Hypoglycemia, unspecified: Secondary | ICD-10-CM | POA: Diagnosis not present

## 2022-12-20 DIAGNOSIS — Z6841 Body Mass Index (BMI) 40.0 and over, adult: Secondary | ICD-10-CM | POA: Diagnosis not present

## 2022-12-29 DIAGNOSIS — K219 Gastro-esophageal reflux disease without esophagitis: Secondary | ICD-10-CM | POA: Diagnosis not present

## 2022-12-29 DIAGNOSIS — R09A2 Foreign body sensation, throat: Secondary | ICD-10-CM | POA: Diagnosis not present

## 2022-12-29 DIAGNOSIS — R1310 Dysphagia, unspecified: Secondary | ICD-10-CM | POA: Diagnosis not present

## 2023-01-02 DIAGNOSIS — M48062 Spinal stenosis, lumbar region with neurogenic claudication: Secondary | ICD-10-CM | POA: Diagnosis not present

## 2023-01-14 ENCOUNTER — Other Ambulatory Visit: Payer: Self-pay | Admitting: Neurology

## 2023-01-17 DIAGNOSIS — R058 Other specified cough: Secondary | ICD-10-CM | POA: Diagnosis not present

## 2023-01-17 DIAGNOSIS — J069 Acute upper respiratory infection, unspecified: Secondary | ICD-10-CM | POA: Diagnosis not present

## 2023-01-24 ENCOUNTER — Other Ambulatory Visit: Payer: Self-pay | Admitting: Neurology

## 2023-01-26 ENCOUNTER — Encounter: Payer: Self-pay | Admitting: Podiatry

## 2023-01-26 ENCOUNTER — Ambulatory Visit (INDEPENDENT_AMBULATORY_CARE_PROVIDER_SITE_OTHER): Payer: PPO

## 2023-01-26 ENCOUNTER — Ambulatory Visit (INDEPENDENT_AMBULATORY_CARE_PROVIDER_SITE_OTHER): Payer: HMO | Admitting: Podiatry

## 2023-01-26 VITALS — Ht 66.0 in | Wt 271.6 lb

## 2023-01-26 DIAGNOSIS — M778 Other enthesopathies, not elsewhere classified: Secondary | ICD-10-CM

## 2023-01-26 DIAGNOSIS — G8929 Other chronic pain: Secondary | ICD-10-CM | POA: Diagnosis not present

## 2023-01-26 DIAGNOSIS — M79675 Pain in left toe(s): Secondary | ICD-10-CM

## 2023-01-26 MED ORDER — MELOXICAM 15 MG PO TABS: 15.0000 mg | ORAL_TABLET | Freq: Every day | ORAL | 0 refills | Status: DC

## 2023-01-26 NOTE — Patient Instructions (Signed)
 Take meloxicam  as directed for 2 weeks.  Take in the morning with food and water.  If you notice any upset stomach, any new onset chest pain or any new concerning issues, stop the medication immediately.  Continue wearing good supportive shoes, recommend shoes with stiff toebox.

## 2023-01-26 NOTE — Progress Notes (Signed)
 Chief Complaint  Patient presents with   Foot Pain    She has noticed increased pain in the left foot in the past few weeks on the big toe area. She has tried wearing different shoes with not much help and resting will help some.     HPI: 68 y.o. female presents today with left first toe pain.  States this has been going on the past couple weeks.  She has tried wearing different shoes.  Pain seems to be aggravated with activity and improves with rest.  Reports intermittent swelling, tingling, throbbing sensations mostly along the toe.  She does have medical history notable for type 2 diabetes, multiple sclerosis, arthritis affecting the knees and hips.  Past Medical History:  Diagnosis Date   ADD (attention deficit disorder)    Anxiety    Biliary colic    Bladder leak    Diabetes mellitus without complication (HCC)    type 2 , diet controlled patient not taking any medications   Diverticular disease    DVT (deep venous thrombosis) (HCC)    left leg , resolved after treatment of blood thinners    Edema of both lower extremities    GERD (gastroesophageal reflux disease)    causes coughing    Glaucoma    Heart murmur    High cholesterol    Hypertension    Hypothyroid    Leg cramps    in my thighs    Leg cramps    Migraines    Multiple sclerosis (HCC)    per patient  that ha sbeen ruled out , they though it was that initially but it did not preogress like it   Optic neuritis    lesion on right optic nerve; right eye only , reduced vision and blurriness , loss of depth perception, glasses do not help when it flares     OSA (obstructive sleep apnea)    nightly cpap    Paresthesias/numbness    fingers and toes    Pneumonia 2017   Restless leg syndrome    Sleep apnea    SOB (shortness of breath)    Thyroid  disease    Hypothyroidism    Past Surgical History:  Procedure Laterality Date   ABDOMINAL HYSTERECTOMY     ANTERIOR CERVICAL DECOMPRESSION/DISCECTOMY FUSION  4 LEVELS N/A 07/14/2021   Procedure: ANTERIOR CERVICAL DECOMPRESSION FUSION CERVICAL THREE- CERVICAL FOUR, CERVICAL FOUR- CERVICAL FIVE, CERVICAL FIVE- CERVICAL SIX WITH INSTRUMENTATION AND ALLOGRAFT;  Surgeon: Beuford Anes, MD;  Location: [facility] OR;  Service: Orthopedics;  Laterality: N/A;   BREAST LUMPECTOMY Left    CESAREAN SECTION     x2    CHOLECYSTECTOMY N/A 08/16/2018   Procedure: LAPAROSCOPIC CHOLECYSTECTOMY;  Surgeon: Tanda Locus, MD;  Location: [facility]S;  Service: General;  Laterality: N/A;   COLONOSCOPY WITH PROPOFOL  N/A 01/03/2019   Procedure: COLONOSCOPY WITH PROPOFOL ;  Surgeon: Lennard Lesta FALCON, MD;  Location: [facility];  Service: Endoscopy;  Laterality: N/A;   MENISCUS REPAIR Left left   MENISCUS REPAIR Right    knee    TUBAL LIGATION     during 2nd c section     Allergies  Allergen Reactions   Adderall [Amphetamine -Dextroamphetamine] Other (See Comments)    Caused elevated BP   Topamax  [Topiramate ]     dizzy    ROS denies any nausea, vomiting, fever, chills, chest pain, shortness of breath.   Physical Exam: There were no vitals filed for this visit.  General:  The patient is alert and oriented x3 in no acute distress.  Dermatology: Skin is warm, dry and supple bilateral lower extremities. Interspaces are clear of maceration and debris.    Vascular: Palpable pedal pulses bilaterally. Capillary refill within normal limits.  No appreciable edema.  No erythema or calor.  Pedal hair growth decreased.  Neurological: Light touch sensation grossly intact bilateral feet.  Vibratory sensation diminished.  Negative Tinel sign.  Musculoskeletal Exam: Tenderness on palpation of left hallux along proximal phalanx dorsally.  No pain on palpation of the interphalangeal joint or metatarsal phalangeal joint.  Mild decreased first BJ range of motion.  No pain with interphalangeal joint range of motion.  No localized edema.  Radiographic Exam: Left foot 01/26/2023 Normal osseous  mineralization. Joint spaces preserved.  No fractures or osseous irregularities noted.  Assessment/Plan of Care: 1. Capsulitis of foot   2. Toe pain, chronic, left      Meds ordered this encounter  Medications   meloxicam  (MOBIC ) 15 MG tablet    Sig: Take 1 tablet (15 mg total) by mouth daily for 21 doses.    Dispense:  21 tablet    Refill:  0   None  Discussed clinical findings with patient today.  # Chronic left great toe pain - Patient electing to defer any corticosteroids due to frequent injections for her knees and concern for abnormality with adrenal gland - Will start patient on course of meloxicam  - Recommend continued use of good stiff soled shoe. - Pain seems to be intermittent - Would like to see patient back as needed if symptoms worsen or fail to improve.   Lalani Winkles L. Lamount MAUL, AACFAS [facility]     [address] [place], [place] [address]                Office [phone]  Fax [phone]

## 2023-02-02 DIAGNOSIS — R7303 Prediabetes: Secondary | ICD-10-CM | POA: Diagnosis not present

## 2023-02-17 ENCOUNTER — Other Ambulatory Visit: Payer: Self-pay | Admitting: Podiatry

## 2023-02-17 DIAGNOSIS — G8929 Other chronic pain: Secondary | ICD-10-CM

## 2023-03-09 NOTE — Therapy (Unsigned)
OUTPATIENT SPEECH LANGUAGE PATHOLOGY SWALLOW EVALUATION   Patient Name: Anna Walker MRN: [id number] DOB:1955/07/06, 68 y.o., female Today's Date: 03/10/2023  PCP: Anna Coe, MD REFERRING PROVIDER: Laren Boom, DO  END OF SESSION:  End of Session - 03/10/23 1126     Visit Number 1    Number of Visits 9    Date for SLP Re-Evaluation 06/02/23   to accomodate scheduling   Authorization Type HTA    Progress Note Due on Visit 10    SLP Start Time 1015    SLP Stop Time  1100    SLP Time Calculation (min) 45 min    Activity Tolerance Patient tolerated treatment well             Past Medical History:  Diagnosis Date   ADD (attention deficit disorder)    Anxiety    Biliary colic    Bladder leak    Diabetes mellitus without complication (HCC)    type 2 , diet controlled patient not taking any medications   Diverticular disease    DVT (deep venous thrombosis) (HCC)    left leg , resolved after treatment of blood thinners    Edema of both lower extremities    GERD (gastroesophageal reflux disease)    causes coughing    Glaucoma    Heart murmur    High cholesterol    Hypertension    Hypothyroid    Leg cramps    "in my thighs"    Leg cramps    Migraines    Multiple sclerosis (HCC)    per patient " that ha sbeen ruled out , they though it was that initially but it did not preogress like it"   Optic neuritis    lesion on right optic nerve; right eye only , reduced vision and blurriness , loss of depth perception, glasses do not help when it flares     OSA (obstructive sleep apnea)    nightly cpap    Paresthesias/numbness    fingers and toes    Pneumonia 2017   Restless leg syndrome    Sleep apnea    SOB (shortness of breath)    Thyroid disease    Hypothyroidism   Past Surgical History:  Procedure Laterality Date   ABDOMINAL HYSTERECTOMY     ANTERIOR CERVICAL DECOMPRESSION/DISCECTOMY FUSION 4 LEVELS N/A 07/14/2021   Procedure: ANTERIOR CERVICAL  DECOMPRESSION FUSION CERVICAL THREE- CERVICAL FOUR, CERVICAL FOUR- CERVICAL FIVE, CERVICAL FIVE- CERVICAL SIX WITH INSTRUMENTATION AND ALLOGRAFT;  Surgeon: Anna Bamberg, MD;  Location: [facility] OR;  Service: Orthopedics;  Laterality: N/A;   BREAST LUMPECTOMY Left    CESAREAN SECTION     x2    CHOLECYSTECTOMY N/A 08/16/2018   Procedure: LAPAROSCOPIC CHOLECYSTECTOMY;  Surgeon: Anna Adu, MD;  Location: [facility];  Service: General;  Laterality: N/A;   COLONOSCOPY WITH PROPOFOL N/A 01/03/2019   Procedure: COLONOSCOPY WITH PROPOFOL;  Surgeon: Anna Shiver, MD;  Location: [facility];  Service: Endoscopy;  Laterality: N/A;   MENISCUS REPAIR Left left   MENISCUS REPAIR Right    knee    TUBAL LIGATION     during 2nd c section    Patient Active Problem List   Diagnosis Date Noted   Cervical myelopathy (HCC) 07/14/2021   Radiculopathy 07/14/2021   Low thyroid stimulating hormone (TSH) level 10/07/2020   Impaired fasting glucose 07/07/2020   Other hyperlipidemia 04/21/2020   Prediabetes 03/31/2020   Essential hypertension 03/31/2020   Vitamin D deficiency 05/02/2019  Class 3 severe obesity with serious comorbidity and body mass index (BMI) of 40.0 to 44.9 in adult [facility]) 05/01/2019   Chronic pain of both knees 04/25/2017   DOE (dyspnea on exertion)    Substernal chest pain 02/08/2015   Attention deficit disorder 12/22/2014   History of optic neuritis 06/20/2014   OSA on CPAP 06/20/2014   Restless leg syndrome 06/20/2014   Other fatigue 06/20/2014   Migraine without aura and without status migrainosus, not intractable 06/20/2014   possible MS 02/24/2014   Hypothyroidism 02/24/2014   GERD (gastroesophageal reflux disease) 02/24/2014    ONSET DATE: 01/02/2023 referral    REFERRING DIAG: R13.10   THERAPY DIAG:  Dysphagia, unspecified type  Rationale for Evaluation and Treatment: Rehabilitation  SUBJECTIVE:   SUBJECTIVE STATEMENT: Pt reports with c/o difficulty swallowing which began  in 2020, tells SLP texture matters, challenges with hard, dry foods, shredded foods, c/o pill dysphagia. Is avoiding foods d/t dysphagia complaints.  Pt accompanied by: self  PERTINENT HISTORY: DM, DVT, GERD, OSA, thyroid disease, heart murmur. Saw ENT 12/29/2023 d/t c/o globus sensation and intermittent choking sensation, ongoing several years. Sx occur with food, not liquids. Prior ACDF srugery.   PAIN:  Are you having pain? No  FALLS: Has patient fallen in last 6 months?  No  LIVING ENVIRONMENT: Lives with: lives with their family and lives alone Lives in: House/apartment  PLOF:  Level of assistance: Independent with ADLs Employment: Part-time employment Currently working at [employer], Diplomatic Services operational officer  PATIENT GOALS: improved swallow function  OBJECTIVE:  Note: Objective measures were completed at Evaluation unless otherwise noted. OBJECTIVE:   DIAGNOSTIC FINDINGS: Flexible Laryngoscopy  Flexible laryngoscopy shows patent anterior nasal cavity with minimal crusting, no discharge or infection.  Mild to moderate interarytenoid edema and erythema consistent with laryngopharyngeal reflux, otherwise normal base of tongue and supraglottis Normal vocal cord mobility without vocal cord nodule, mass, polyp or tumor. Hypopharynx normal without mass, pooling of secretions or aspiration.   ENT Impression & Plans:  Anna Walker is a 68 y.o. female with ongoing history of globus sensation, intermittent dysphagia to solid foods as well as occasional choking. No recent history of pneumonia, no difficulty with liquids. Laryngoscopy performed today demonstrates findings consistent with laryngopharyngeal reflux, but is otherwise unremarkable. Patient was reassured. I recommended referral to speech-language pathology for modified barium swallow study. Patient was counseled that pending findings, she may also benefit from evaluation by gastroenterology.   INSTRUMENTAL SWALLOW STUDY FINDINGS (MBSS)  ORDERED Objective swallow impairments: TBD Objective recommended compensations: TBD  COGNITION: Overall cognitive status: Within functional limits for tasks assessed  SUBJECTIVE DYSPHAGIA REPORTS:  Date of onset: Swallow impacted in early 2000's Reported symptoms: coughing with solids, choking with solids, globus sensation, regurgitation, and chest pain while eating  Current diet: regular  Co-morbid voice changes: Yes, pt reports voice feels strained/hoarse, variable severity. Potentially related to LPR. Vocal quality clear today.   GERD sx: chronic throat clearing, currently exacerbated by recent cold and globus sensation   FACTORS WHICH MAY INCREASE RISK OF ADVERSE EVENT IN PRESENCE OF ASPIRATION:  General health: well appearing  Risk factors: GERD or other GI disease    ORAL MOTOR EXAMINATION: Overall status: WFL  CLINICAL SWALLOW ASSESSMENT:   Dentition: adequate natural dentition Vocal quality at baseline: hoarse, strained, and low vocal intensity Patient directly observed with POs: Yes: thin liquids  Feeding: able to feed self Liquids provided by: cup [facility] Swallow Protocol: Pass Oral phase signs and symptoms:  N/A Pharyngeal phase  signs and symptoms: complaints of globus with swallows per pt report  PATIENT REPORTED OUTCOME MEASURES (PROM): Question Patient's Response  My swallowing problem has caused me to lose weight 0  2.  My swallowing problem interferes with my ability to go out to meals 2  3.  Swallowing liquids takes extra effort 2  4.  Swallowing solids takes extra effort 2  5.  Swallowing pills takes extra effort 3  6.  Swallowing is painful 1  7.  The pleasure of eating is affected by my swallowing 0  8.  When I swallow food sticks in my throat 3  9.  I cough when I eat 2  10.  Swallowing is stressful  2  0= No problem 4= Severe problem                                                                                                                             TREATMENT:  03/10/2023: Education provided on evaluation results and SLP's recommendations. Pt verbalizes agreement with POC, all questions answered to satisfaction. Following evaluation, SLP initiated training re: reflux precautions, recommendation for use of reflux raft or reflux gourmet as natural alternative for further management. Education provided for swallow compensations and strategies for suspected deficits to include liquid wash, using puree to move globus, double swallow, hard swallow, sit upright with all PO, stay upright for 30-60 minutes following PO.   PATIENT EDUCATION: Education details: see tx section Person educated: Patient Education method: Explanation, Demonstration, and Handouts Education comprehension: verbalized understanding, returned demonstration, and needs further education   ASSESSMENT:  CLINICAL IMPRESSION: Patient is a 68 y.o. F who was seen today for dysphagia evaluation upon referral from ENT. Pt presents with suspected oropharyngeal deficits based on clinical swallow eval. Subjective reports of globus sensation, coughing/choking with solids and occasionally liquids, avoiding preferred foods, pills feeling stuck, voice changes. Hx of ACDF surgery. Reports dysphagia complaints prior to surgery but surgery may have exacerbated. Presenting with chronic throat clearing, likely 2/2 laryngopharyngeal reflux. Pt did pass 3oz water challenge, indicating low likelihood of aspiration risk. Pt would benefit from MBSS for further evaluation of swallow function and safety to inform plan of care. Pt is agreeable to participate in swallow study and schedule f/u appointments after that assessment.   OBJECTIVE IMPAIRMENTS: include dysphagia. These impairments are limiting patient from safety when swallowing. Factors affecting potential to achieve goals and functional outcome are co-morbidities. Patient will benefit from skilled SLP services to address above impairments and improve  overall function.  REHAB POTENTIAL: Good   GOALS: Goals reviewed with patient? Yes  SHORT TERM GOALS: Target date: 04/21/2023  Pt will complete MBSS Baseline: Goal status: INITIAL  2.  Pt will accurately demonstrate swallow exercises with mod-I over 2 sessions  Baseline:  Goal status: INITIAL  3.  Pt will demonstrate understanding of swallow compensations or strategies via teach back with mod-I  Baseline:  Goal status: INITIAL   LONG TERM  GOALS: Target date: 06/02/2023  Pt will report improvement via PROM by dc  Baseline: EAT 10 = 17 Goal status: INITIAL  2.  Pt will be IND with dysphagia HEP  Baseline:  Goal status: INITIAL  3.  Pt will reduce throat clears via use of alternatives in 80% of opportunities over 2 sessions  Baseline:  Goal status: INITIAL  PLAN:  SLP FREQUENCY: 1-2x/week (TBD per MBSS results)   SLP DURATION: 12 weeks  PLANNED INTERVENTIONS: Aspiration precaution training, Pharyngeal strengthening exercises, Diet toleration management , Trials of upgraded texture/liquids, Cueing hierachy, Internal/external aids, SLP instruction and feedback, Compensatory strategies, and Patient/family education    Maia Breslow, CCC-SLP 03/10/2023, 11:30 AM

## 2023-03-10 ENCOUNTER — Encounter: Payer: Self-pay | Admitting: Speech Pathology

## 2023-03-10 ENCOUNTER — Ambulatory Visit: Payer: PPO | Attending: Otolaryngology | Admitting: Speech Pathology

## 2023-03-10 DIAGNOSIS — R1310 Dysphagia, unspecified: Secondary | ICD-10-CM | POA: Diagnosis present

## 2023-03-10 NOTE — Patient Instructions (Signed)
   Effortful Swallows - Squeeze hard with the muscles in your neck while you swallow your  saliva or a sip of water - Repeat 20 times, 2-3 times a day, and use whenever you eat or drink  - Drinking around 8 glasses of water a day - electrolyte packets/waters strawberry kiwi is good:) - "Hard swallow" to strengthen swallowing musculature and substitute cough/throat clear

## 2023-03-21 ENCOUNTER — Telehealth (HOSPITAL_COMMUNITY): Payer: Self-pay | Admitting: *Deleted

## 2023-03-21 NOTE — Telephone Encounter (Signed)
 Attempted to contact patient to schedule OP MBS. Left VM. RKEEL

## 2023-03-22 ENCOUNTER — Other Ambulatory Visit: Payer: Self-pay | Admitting: Neurology

## 2023-03-28 ENCOUNTER — Telehealth (HOSPITAL_COMMUNITY): Payer: Self-pay | Admitting: *Deleted

## 2023-03-28 NOTE — Telephone Encounter (Signed)
 I contacted the patient to schedule OP MBS. She requested I cancel the order due to her inability to afford her co-pay. I asked her to reach out to her MD for a new order in she changed her mind about the testing in the future. RKEEL

## 2023-04-06 DIAGNOSIS — R7303 Prediabetes: Secondary | ICD-10-CM | POA: Diagnosis not present

## 2023-04-06 DIAGNOSIS — G2581 Restless legs syndrome: Secondary | ICD-10-CM | POA: Diagnosis not present

## 2023-04-06 DIAGNOSIS — I10 Essential (primary) hypertension: Secondary | ICD-10-CM | POA: Diagnosis not present

## 2023-04-06 DIAGNOSIS — M25561 Pain in right knee: Secondary | ICD-10-CM | POA: Diagnosis not present

## 2023-04-06 DIAGNOSIS — F339 Major depressive disorder, recurrent, unspecified: Secondary | ICD-10-CM | POA: Diagnosis not present

## 2023-04-06 DIAGNOSIS — M25562 Pain in left knee: Secondary | ICD-10-CM | POA: Diagnosis not present

## 2023-04-06 DIAGNOSIS — F39 Unspecified mood [affective] disorder: Secondary | ICD-10-CM | POA: Diagnosis not present

## 2023-04-06 DIAGNOSIS — E559 Vitamin D deficiency, unspecified: Secondary | ICD-10-CM | POA: Diagnosis not present

## 2023-04-06 DIAGNOSIS — E039 Hypothyroidism, unspecified: Secondary | ICD-10-CM | POA: Diagnosis not present

## 2023-04-06 DIAGNOSIS — E785 Hyperlipidemia, unspecified: Secondary | ICD-10-CM | POA: Diagnosis not present

## 2023-04-06 DIAGNOSIS — Z0000 Encounter for general adult medical examination without abnormal findings: Secondary | ICD-10-CM | POA: Diagnosis not present

## 2023-04-13 DIAGNOSIS — M1712 Unilateral primary osteoarthritis, left knee: Secondary | ICD-10-CM | POA: Diagnosis not present

## 2023-04-13 DIAGNOSIS — M170 Bilateral primary osteoarthritis of knee: Secondary | ICD-10-CM | POA: Diagnosis not present

## 2023-04-13 DIAGNOSIS — M1711 Unilateral primary osteoarthritis, right knee: Secondary | ICD-10-CM | POA: Diagnosis not present

## 2023-04-17 DIAGNOSIS — M5416 Radiculopathy, lumbar region: Secondary | ICD-10-CM | POA: Diagnosis not present

## 2023-04-19 DIAGNOSIS — R059 Cough, unspecified: Secondary | ICD-10-CM | POA: Diagnosis not present

## 2023-04-19 DIAGNOSIS — J309 Allergic rhinitis, unspecified: Secondary | ICD-10-CM | POA: Diagnosis not present

## 2023-04-19 DIAGNOSIS — I10 Essential (primary) hypertension: Secondary | ICD-10-CM | POA: Diagnosis not present

## 2023-04-19 DIAGNOSIS — J45909 Unspecified asthma, uncomplicated: Secondary | ICD-10-CM | POA: Diagnosis not present

## 2023-04-19 DIAGNOSIS — R0989 Other specified symptoms and signs involving the circulatory and respiratory systems: Secondary | ICD-10-CM | POA: Diagnosis not present

## 2023-04-25 ENCOUNTER — Other Ambulatory Visit: Payer: Self-pay | Admitting: Neurology

## 2023-04-26 ENCOUNTER — Telehealth: Payer: Self-pay | Admitting: Neurology

## 2023-04-26 ENCOUNTER — Other Ambulatory Visit: Payer: Self-pay | Admitting: Neurology

## 2023-04-26 MED ORDER — GABAPENTIN 300 MG PO CAPS: ORAL_CAPSULE | ORAL | 1 refills | Status: DC

## 2023-04-26 NOTE — Telephone Encounter (Signed)
Prescription sent in.  Patient advised

## 2023-04-26 NOTE — Telephone Encounter (Signed)
 Pt called back, originally the dose was more, so that is why its not been filled, she had more. Then her ortho gave her an RX so she was using that. She is now out and need it refilled. The ortho RX wasn't refilled, it was just that one time.  Correct dose is Takes 1 capsule at dinner and 2 at bedtime.

## 2023-04-26 NOTE — Telephone Encounter (Signed)
 Pt needs a refill on her gabapentin 300mg . She has been trying to refill since last Thursday. She is completely out.  Takes 1 capsule at dinner and 2 at bedtime.   Pt called [facility] to get a loaner dose for the evening but they refused since the medication not being filled since August 2024

## 2023-05-01 DIAGNOSIS — Z1231 Encounter for screening mammogram for malignant neoplasm of breast: Secondary | ICD-10-CM | POA: Diagnosis not present

## 2023-05-04 DIAGNOSIS — M5416 Radiculopathy, lumbar region: Secondary | ICD-10-CM | POA: Diagnosis not present

## 2023-05-05 ENCOUNTER — Encounter (HOSPITAL_COMMUNITY): Payer: Self-pay

## 2023-05-05 ENCOUNTER — Ambulatory Visit (HOSPITAL_COMMUNITY)

## 2023-05-05 ENCOUNTER — Ambulatory Visit (HOSPITAL_COMMUNITY)
Admission: EM | Admit: 2023-05-05 | Discharge: 2023-05-05 | Disposition: A | Discharge status: Home / Self Care | Attending: Internal Medicine | Admitting: Internal Medicine

## 2023-05-05 DIAGNOSIS — S60222A Contusion of left hand, initial encounter: Secondary | ICD-10-CM

## 2023-05-05 DIAGNOSIS — M79642 Pain in left hand: Secondary | ICD-10-CM | POA: Diagnosis not present

## 2023-05-05 DIAGNOSIS — S6992XA Unspecified injury of left wrist, hand and finger(s), initial encounter: Secondary | ICD-10-CM

## 2023-05-05 MED ORDER — NAPROXEN 500 MG PO TABS: 500.0000 mg | ORAL_TABLET | Freq: Two times a day (BID) | ORAL | 0 refills | Status: AC

## 2023-05-05 NOTE — Discharge Instructions (Addendum)
 You were seen today for pain, swelling, and bruising in your hand after a fall. An X-ray of your hand showed swelling but no fracture or other abnormalities. Your symptoms are most likely due to a contusion, which is a deep bruise caused by a blunt injury. This type of injury leads to bleeding under the skin and can cause discoloration that may appear blue, purple, or yellow over time.  Take the medication prescribed to help with pain and inflammation. While you are on this medication, do not take meloxicam or any over-the-counter pain relievers such as aspirin, Motrin, ibuprofen, or Aleve.   To help manage your symptoms, apply ice and heat to the area throughout the day. Soaking your hand in warm water with Epsom salt can also provide relief.   Watch your symptoms closely. If the pain does not improve or gets better and then comes back, follow up with your primary care provider or an orthopedic specialist.

## 2023-05-05 NOTE — ED Triage Notes (Signed)
 Pt states that she fell and landed on her left hand. X1 day

## 2023-05-05 NOTE — ED Provider Notes (Signed)
 [facility]    CSN: [id number] Arrival date & time: 05/05/23  1726      History   Chief Complaint Chief Complaint  Patient presents with   Hand Injury    Left hand injury x1 day    HPI SVARA Anna Walker is a 68 y.o. female.   Cortny Walker is a 68 year old female who presents for evaluation of a left hand injury. She reports that she misstepped while walking in her home, resulting in a fall during which she extended her hands to brace herself. She experienced immediate pain, swelling, and bruising in the left hand. The fall occurred shortly before her arrival at urgent care today. She denies any dizziness, shortness of breath, chest pain, or other concerning symptoms before or after the fall. She also denies any numbness or tingling in the wrist, hand, or fingers. She describes the pain as a throbbing sensation. No interventions were performed prior to arrival.  The following portions of the patient's history were reviewed and updated as appropriate: allergies, current medications, past family history, past medical history, past social history, past surgical history, and problem list.      Past Medical History:  Diagnosis Date   ADD (attention deficit disorder)    Anxiety    Biliary colic    Bladder leak    Diabetes mellitus without complication (HCC)    type 2 , diet controlled patient not taking any medications   Diverticular disease    DVT (deep venous thrombosis) (HCC)    left leg , resolved after treatment of blood thinners    Edema of both lower extremities    GERD (gastroesophageal reflux disease)    causes coughing    Glaucoma    Heart murmur    High cholesterol    Hypertension    Hypothyroid    Leg cramps    "in my thighs"    Leg cramps    Migraines    Multiple sclerosis (HCC)    per patient " that ha sbeen ruled out , they though it was that initially but it did not preogress like it"   Optic neuritis    lesion on right optic nerve;  right eye only , reduced vision and blurriness , loss of depth perception, glasses do not help when it flares     OSA (obstructive sleep apnea)    nightly cpap    Paresthesias/numbness    fingers and toes    Pneumonia 2017   Restless leg syndrome    Sleep apnea    SOB (shortness of breath)    Thyroid disease    Hypothyroidism    Patient Active Problem List   Diagnosis Date Noted   Cervical myelopathy (HCC) 07/14/2021   Radiculopathy 07/14/2021   Low thyroid stimulating hormone (TSH) level 10/07/2020   Impaired fasting glucose 07/07/2020   Other hyperlipidemia 04/21/2020   Prediabetes 03/31/2020   Essential hypertension 03/31/2020   Vitamin D deficiency 05/02/2019   Class 3 severe obesity with serious comorbidity and body mass index (BMI) of 40.0 to 44.9 in adult (HCC) 05/01/2019   Chronic pain of both knees 04/25/2017   DOE (dyspnea on exertion)    Substernal chest pain 02/08/2015   Attention deficit disorder 12/22/2014   History of optic neuritis 06/20/2014   OSA on CPAP 06/20/2014   Restless leg syndrome 06/20/2014   Other fatigue 06/20/2014   Migraine without aura and without status migrainosus, not intractable 06/20/2014   possible MS 02/24/2014  Hypothyroidism 02/24/2014   GERD (gastroesophageal reflux disease) 02/24/2014    Past Surgical History:  Procedure Laterality Date   ABDOMINAL HYSTERECTOMY     ANTERIOR CERVICAL DECOMPRESSION/DISCECTOMY FUSION 4 LEVELS N/A 07/14/2021   Procedure: ANTERIOR CERVICAL DECOMPRESSION FUSION CERVICAL THREE- CERVICAL FOUR, CERVICAL FOUR- CERVICAL FIVE, CERVICAL FIVE- CERVICAL SIX WITH INSTRUMENTATION AND ALLOGRAFT;  Surgeon: Estill Bamberg, MD;  Location: [facility];  Service: Orthopedics;  Laterality: N/A;   BREAST LUMPECTOMY Left    CESAREAN SECTION     x2    CHOLECYSTECTOMY N/A 08/16/2018   Procedure: LAPAROSCOPIC CHOLECYSTECTOMY;  Surgeon: Gaynelle Adu, MD;  Location: [facility];  Service: General;  Laterality: N/A;   COLONOSCOPY WITH  PROPOFOL N/A 01/03/2019   Procedure: COLONOSCOPY WITH PROPOFOL;  Surgeon: Graylin Shiver, MD;  Location: [facility];  Service: Endoscopy;  Laterality: N/A;   MENISCUS REPAIR Left left   MENISCUS REPAIR Right    knee    TUBAL LIGATION     during 2nd c section     OB History     Gravida  2   Para  2   Term      Preterm      AB      Living         SAB      IAB      Ectopic      Multiple      Live Births               Home Medications    Prior to Admission medications   Medication Sig Start Date End Date Taking? Authorizing Provider  albuterol (VENTOLIN HFA) 108 (90 Base) MCG/ACT inhaler Inhale 1 puff into the lungs every 4 (four) hours as needed. 02/14/22  Yes [provider]  amantadine (SYMMETREL) 100 MG capsule TAKE 2 CAPSULES BY MOUTH DAILY. 03/23/23  Yes Jaffe, Adam R, DO  FLUoxetine (PROZAC) 20 MG capsule Take 40 mg by mouth at bedtime. 06/21/18  Yes [provider]  gabapentin (NEURONTIN) 300 MG capsule Take 1 capsule at dinner and 2 capsules at bedtime. 04/26/23  Yes Jaffe, Adam R, DO  hydrochlorothiazide (HYDRODIURIL) 12.5 MG tablet Take 12.5 mg by mouth daily.  02/06/17  Yes [provider]  levothyroxine (SYNTHROID) 137 MCG tablet Take 137 mcg by mouth daily before breakfast.   Yes [provider]  meloxicam (MOBIC) 15 MG tablet  06/15/20  Yes [provider]  metFORMIN (GLUCOPHAGE) 500 MG tablet Take 500 mg by mouth daily with breakfast. 11/07/22  Yes [provider]  methocarbamol (ROBAXIN) 500 MG tablet Take 500-1,000 mg by mouth every 6 (six) hours as needed. 01/21/23  Yes [provider]  naproxen (NAPROSYN) 500 MG tablet Take 1 tablet (500 mg total) by mouth 2 (two) times daily with a meal. 05/05/23  Yes Anija Brickner, Lelon Mast, FNP  rOPINIRole (REQUIP) 3 MG tablet TAKE 1 TABLET BY MOUTH AT [facility] AND AT BEDTIME AS NEEDED 01/17/23  Yes Jaffe, Adam R, DO  rosuvastatin (CRESTOR) 10 MG tablet Take 10  mg by mouth daily. 03/14/22  Yes [provider]  Vitamin D, Ergocalciferol, (DRISDOL) 1.25 MG (50000 UNIT) CAPS capsule Take 50,000 Units by mouth 2 (two) times a week. Tues, Thur   Yes [provider]  Dulaglutide (TRULICITY) 0.75 MG/0.5ML SOAJ ADMINISTER 0.75 MG UNDER THE SKIN 1 TIME A WEEK Patient not taking: Reported on 03/10/2023 09/16/21   [provider]    Family History Family History  Problem Relation  Age of Onset   Barrett's esophagus Mother    Hypertension Mother    Obesity Mother    Luiz Blare' disease Sister    Rheum arthritis Sister    Heart attack Father 48   Sudden death Father    Coronary artery disease Neg Hx     Social History Social History   Tobacco Use   Smoking status: Former    Current packs/day: 0.00    Types: Cigarettes    Quit date: 01/25/1984    Years since quitting: 39.3   Smokeless tobacco: Never  Vaping Use   Vaping status: Never Used  Substance Use Topics   Alcohol use: Not Currently   Drug use: No     Allergies   Adderall [amphetamine-dextroamphetamine] and Topamax [topiramate]   Review of Systems Review of Systems  Eyes:  Negative for visual disturbance.  Cardiovascular:  Negative for chest pain.  Gastrointestinal:  Negative for nausea and vomiting.  Musculoskeletal:  Positive for joint swelling.  Neurological:  Negative for dizziness, syncope, weakness and numbness.  All other systems reviewed and are negative.    Physical Exam Triage Vital Signs ED Triage Vitals  Encounter Vitals Group     BP 05/05/23 1843 122/72     Systolic BP Percentile --      Diastolic BP Percentile --      Pulse Rate 05/05/23 1843 91     Resp 05/05/23 1843 16     Temp 05/05/23 1843 98.2 F (36.8 C)     Temp Source 05/05/23 1843 Oral     SpO2 05/05/23 1843 97 %     Weight 05/05/23 1842 265 lb (120.2 kg)     Height 05/05/23 1842 5\' 6"  (1.676 m)     Head Circumference --      Peak Flow --      Pain Score 05/05/23 1842 5      Pain Loc --      Pain Education --      Exclude from Growth Chart --    No data found.  Updated Vital Signs BP 122/72 (BP Location: Left Arm)   Pulse 91   Temp 98.2 F (36.8 C) (Oral)   Resp 16   Ht 5\' 6"  (1.676 m)   Wt 265 lb (120.2 kg)   SpO2 97%   BMI 42.77 kg/m   Visual Acuity Right Eye Distance:   Left Eye Distance:   Bilateral Distance:    Right Eye Near:   Left Eye Near:    Bilateral Near:     Physical Exam Vitals reviewed.  Constitutional:      General: She is awake. She is not in acute distress.    Appearance: Normal appearance. She is well-developed. She is obese. She is not ill-appearing, toxic-appearing or diaphoretic.  HENT:     Head: Normocephalic.  Cardiovascular:     Rate and Rhythm: Normal rate.  Pulmonary:     Effort: Pulmonary effort is normal.  Musculoskeletal:        General: Normal range of motion.     Left hand: Swelling and tenderness present. No deformity, lacerations or bony tenderness. Normal range of motion. Normal strength. Normal sensation. There is no disruption of two-point discrimination. Normal capillary refill.     Cervical back: Normal, full passive range of motion without pain, normal range of motion and neck supple.     Thoracic back: Normal.     Lumbar back: Normal.     Comments: Right hand-dominant. Bruising, swelling  and tenderness noted to the dorsal aspect of the left hand.   Skin:    General: Skin is warm and dry.  Neurological:     General: No focal deficit present.     Mental Status: She is alert and oriented to person, place, and time.      UC Treatments / Results  Labs (all labs ordered are listed, but only abnormal results are displayed) Labs Reviewed - No data to display  EKG   Radiology DG Hand Complete Left Result Date: 05/05/2023 CLINICAL DATA:  Injury. EXAM: LEFT HAND - COMPLETE 3+ VIEW COMPARISON:  None Available. FINDINGS: There is soft tissue swelling over the dorsal aspect of the distal  metacarpals. There is no evidence of fracture or dislocation. There is no evidence of arthropathy or other focal bone abnormality. IMPRESSION: Soft tissue swelling over the dorsal aspect of the distal metacarpals. No acute fracture or dislocation. Electronically Signed   By: Darliss Cheney M.D.   On: 05/05/2023 19:54    Procedures Procedures (including critical care time)  Medications Ordered in UC Medications - No data to display  Initial Impression / Assessment and Plan / UC Course  I have reviewed the triage vital signs and the nursing notes.  Pertinent labs & imaging results that were available during my care of the patient were reviewed by me and considered in my medical decision making (see chart for details).    68 year old female presenting with acute pain, bruising, and swelling to the left hand following a fall that occurred shortly before her arrival at urgent care. She reports no neurological symptoms before or after the fall to suggest a non-mechanical cause. There is no numbness or tingling in the wrist, hand, or fingers. No interventions were performed prior to her visit. X-ray of the hand revealed soft tissue swelling but no evidence of fracture or dislocation. An ACE wrap was applied in clinic for support. Patient was prescribed naproxen for pain management; she also has meloxicam prescribed but uses it only as needed. She was advised not to take naproxen in combination with meloxicam or any other over-the-counter NSAIDs to avoid duplication. Supportive care with rest, ice, compression, and elevation (RICE therapy) was recommended. Orthopedic follow-up advised if symptoms do not improve.  Today's evaluation has revealed no signs of a dangerous process. Discussed diagnosis with patient and/or guardian. Patient and/or guardian aware of their diagnosis, possible red flag symptoms to watch out for and need for close follow up. Patient and/or guardian understands verbal and written  discharge instructions. Patient and/or guardian comfortable with plan and disposition.  Patient and/or guardian has a clear mental status at this time, good insight into illness (after discussion and teaching) and has clear judgment to make decisions regarding their care  Documentation was completed with the aid of voice recognition software. Transcription may contain typographical errors. Final Clinical Impressions(s) / UC Diagnoses   Final diagnoses:  Left hand pain  Injury of left hand, initial encounter  Contusion of left hand, initial encounter     Discharge Instructions      You were seen today for pain, swelling, and bruising in your hand after a fall. An X-ray of your hand showed swelling but no fracture or other abnormalities. Your symptoms are most likely due to a contusion, which is a deep bruise caused by a blunt injury. This type of injury leads to bleeding under the skin and can cause discoloration that may appear blue, purple, or yellow over time.  Take  the medication prescribed to help with pain and inflammation. While you are on this medication, do not take meloxicam or any over-the-counter pain relievers such as aspirin, Motrin, ibuprofen, or Aleve.   To help manage your symptoms, apply ice and heat to the area throughout the day. Soaking your hand in warm water with Epsom salt can also provide relief.   Watch your symptoms closely. If the pain does not improve or gets better and then comes back, follow up with your primary care provider or an orthopedic specialist.     ED Prescriptions     Medication Sig Dispense Auth. Provider   naproxen (NAPROSYN) 500 MG tablet Take 1 tablet (500 mg total) by mouth 2 (two) times daily with a meal. 20 tablet Lurline Idol, FNP      PDMP not reviewed this encounter.   Lurline Idol, [place] 05/05/23 2039

## 2023-05-09 DIAGNOSIS — M546 Pain in thoracic spine: Secondary | ICD-10-CM | POA: Diagnosis not present

## 2023-05-09 DIAGNOSIS — M1812 Unilateral primary osteoarthritis of first carpometacarpal joint, left hand: Secondary | ICD-10-CM | POA: Diagnosis not present

## 2023-05-15 ENCOUNTER — Other Ambulatory Visit: Payer: Self-pay

## 2023-05-15 ENCOUNTER — Encounter (HOSPITAL_COMMUNITY): Payer: Self-pay

## 2023-05-15 ENCOUNTER — Emergency Department (HOSPITAL_COMMUNITY)
Admission: EM | Admit: 2023-05-15 | Discharge: 2023-05-16 | Disposition: A | Discharge status: Home / Self Care | Attending: Emergency Medicine | Admitting: Emergency Medicine

## 2023-05-15 ENCOUNTER — Emergency Department (HOSPITAL_COMMUNITY)

## 2023-05-15 DIAGNOSIS — S299XXA Unspecified injury of thorax, initial encounter: Secondary | ICD-10-CM | POA: Diagnosis not present

## 2023-05-15 DIAGNOSIS — S3992XA Unspecified injury of lower back, initial encounter: Secondary | ICD-10-CM | POA: Diagnosis not present

## 2023-05-15 DIAGNOSIS — I10 Essential (primary) hypertension: Secondary | ICD-10-CM | POA: Diagnosis not present

## 2023-05-15 DIAGNOSIS — W010XXA Fall on same level from slipping, tripping and stumbling without subsequent striking against object, initial encounter: Secondary | ICD-10-CM | POA: Insufficient documentation

## 2023-05-15 DIAGNOSIS — M549 Dorsalgia, unspecified: Secondary | ICD-10-CM | POA: Diagnosis not present

## 2023-05-15 DIAGNOSIS — M5442 Lumbago with sciatica, left side: Secondary | ICD-10-CM | POA: Diagnosis not present

## 2023-05-15 DIAGNOSIS — E119 Type 2 diabetes mellitus without complications: Secondary | ICD-10-CM | POA: Diagnosis not present

## 2023-05-15 DIAGNOSIS — M4804 Spinal stenosis, thoracic region: Secondary | ICD-10-CM | POA: Diagnosis not present

## 2023-05-15 DIAGNOSIS — M47814 Spondylosis without myelopathy or radiculopathy, thoracic region: Secondary | ICD-10-CM | POA: Diagnosis not present

## 2023-05-15 DIAGNOSIS — Z79899 Other long term (current) drug therapy: Secondary | ICD-10-CM | POA: Insufficient documentation

## 2023-05-15 DIAGNOSIS — Z7984 Long term (current) use of oral hypoglycemic drugs: Secondary | ICD-10-CM | POA: Diagnosis not present

## 2023-05-15 DIAGNOSIS — M545 Low back pain, unspecified: Secondary | ICD-10-CM | POA: Diagnosis present

## 2023-05-15 DIAGNOSIS — M5441 Lumbago with sciatica, right side: Secondary | ICD-10-CM | POA: Insufficient documentation

## 2023-05-15 DIAGNOSIS — M4807 Spinal stenosis, lumbosacral region: Secondary | ICD-10-CM | POA: Diagnosis not present

## 2023-05-15 DIAGNOSIS — M48061 Spinal stenosis, lumbar region without neurogenic claudication: Secondary | ICD-10-CM | POA: Diagnosis not present

## 2023-05-15 LAB — CBC
HCT: 41 % (ref 36.0–46.0)
Hemoglobin: 13.2 g/dL (ref 12.0–15.0)
MCH: 27.8 pg (ref 26.0–34.0)
MCHC: 32.2 g/dL (ref 30.0–36.0)
MCV: 86.5 fL (ref 80.0–100.0)
Platelets: 247 10*3/uL (ref 150–400)
RBC: 4.74 MIL/uL (ref 3.87–5.11)
RDW: 14 % (ref 11.5–15.5)
WBC: 8.1 10*3/uL (ref 4.0–10.5)
nRBC: 0 % (ref 0.0–0.2)

## 2023-05-15 LAB — BASIC METABOLIC PANEL WITH GFR
Anion gap: 10 (ref 5–15)
BUN: 9 mg/dL (ref 8–23)
CO2: 29 mmol/L (ref 22–32)
Calcium: 9.3 mg/dL (ref 8.9–10.3)
Chloride: 102 mmol/L (ref 98–111)
Creatinine, Ser: 0.91 mg/dL (ref 0.44–1.00)
GFR, Estimated: >60 mL/min (ref >60)
Glucose, Bld: 99 mg/dL (ref 70–99)
Potassium: 3.6 mmol/L (ref 3.5–5.1)
Sodium: 141 mmol/L (ref 135–145)

## 2023-05-15 MED ORDER — METHOCARBAMOL 500 MG PO TABS
500.0000 mg | ORAL_TABLET | Freq: Once | ORAL | Status: AC
  Administered 2023-05-15: 500 mg via ORAL
  Filled 2023-05-15: qty 1

## 2023-05-15 MED ORDER — ACETAMINOPHEN 325 MG PO TABS
650.0000 mg | ORAL_TABLET | Freq: Once | ORAL | Status: AC
  Administered 2023-05-15: 650 mg via ORAL
  Filled 2023-05-15: qty 2

## 2023-05-15 NOTE — ED Triage Notes (Signed)
 Patient had a fall on [place] a jail and came down hard in the chair on buttock and went to ortho last week and was prescribed methacarbamol which has not worked.  Patient report new pain and urinary incontinence.  Tearful in triage.

## 2023-05-15 NOTE — ED Provider Notes (Signed)
 [place] EMERGENCY DEPARTMENT AT [facility] Provider Note   CSN: [id number] Arrival date & time: 05/15/23  1007     History  Chief Complaint  Patient presents with   Back Pain    Anna Walker is a 68 y.o. female.  Patient with history of diabetes, DVT, hypertension, hyperlipidemia, OSA presents today with complaints of back pain. She states that on 4/11 she had a mechanical fall where she stepped into a sunken area in the ground and lost her balance and fell into a chair on her bottom. Initially only had left hand pain and was seen at urgent care for same. Woke up the next morning with middle and low back pain that has been persistently worsening since then. She saw orthopedics for this pain and was given robaxin  which she has been taking with some improvement. States they had planned for her to have an MRI of her back outpatient later this week. However over the last few days the pain has worsened and has moved lower in her back. States the pain radiates down the outside of both her legs, pain is worse in the left leg than the right. Denies numbness in her legs, does note some sensation changes in the perineal area. She also has had difficulty controlling her bladder today and has urinated on herself. This is not a normal problem for her. Denies any fevers or chills. She is able to walk with a cane which is her baseline.  She does note that she has some sensation changes in her lower legs at baseline.  No changes in this today.  Originally this was thought to be due to MS but was later ruled that she does not actually have MS.  The history is provided by the patient. No language interpreter was used.  Back Pain      Home Medications Prior to Admission medications   Medication Sig Start Date End Date Taking? Authorizing Provider  albuterol (VENTOLIN HFA) 108 (90 Base) MCG/ACT inhaler Inhale 1 puff into the lungs every 4 (four) hours as needed. 02/14/22   [provider]  amantadine  (SYMMETREL ) 100 MG capsule TAKE 2 CAPSULES BY MOUTH DAILY. 03/23/23   Merriam Abbey, DO  Dulaglutide  (TRULICITY ) 0.75 MG/0.5ML SOAJ ADMINISTER 0.75 MG UNDER THE SKIN 1 TIME A WEEK Patient not taking: Reported on 03/10/2023 09/16/21   [provider]  FLUoxetine  (PROZAC ) 20 MG capsule Take 40 mg by mouth at bedtime. 06/21/18   [provider]  gabapentin  (NEURONTIN ) 300 MG capsule Take 1 capsule at dinner and 2 capsules at bedtime. 04/26/23   Merriam Abbey, DO  hydrochlorothiazide  (HYDRODIURIL ) 12.5 MG tablet Take 12.5 mg by mouth daily.  02/06/17   [provider]  levothyroxine  (SYNTHROID ) 137 MCG tablet Take 137 mcg by mouth daily before breakfast.    [provider]  meloxicam  (MOBIC ) 15 MG tablet  06/15/20   [provider]  metFORMIN (GLUCOPHAGE) 500 MG tablet Take 500 mg by mouth daily with breakfast. 11/07/22   [provider]  methocarbamol  (ROBAXIN ) 500 MG tablet Take 500-1,000 mg by mouth every 6 (six) hours as needed. 01/21/23   [provider]  naproxen  (NAPROSYN ) 500 MG tablet Take 1 tablet (500 mg total) by mouth 2 (two) times daily with a meal. 05/05/23   Maryruth Sol, FNP  rOPINIRole  (REQUIP ) 3 MG tablet TAKE 1 TABLET BY MOUTH AT [facility] AND AT BEDTIME AS NEEDED 01/17/23   Merriam Abbey, DO  rosuvastatin (CRESTOR) 10 MG tablet Take 10 mg by mouth daily. 03/14/22   [provider]  Vitamin D , Ergocalciferol , (DRISDOL ) 1.25 MG (50000 UNIT) CAPS capsule Take 50,000 Units by mouth 2 (two) times a week. Tues, Thur    [provider]      Allergies    Adderall [amphetamine -dextroamphetamine] and Topamax  [topiramate ]    Review of Systems   Review of Systems  Musculoskeletal:  Positive for back pain.  All other systems reviewed and are negative.   Physical Exam Updated Vital Signs BP 127/87 (BP Location: Right Arm)   Pulse 80   Temp 98.1 F (36.7 C) (Oral)   Resp 17   Ht 5'  6" (1.676 m)   Wt 120.2 kg   SpO2 99%   BMI 42.77 kg/m  Physical Exam Vitals and nursing note reviewed.  Constitutional:      General: She is not in acute distress.    Appearance: Normal appearance. She is normal weight. She is not ill-appearing, toxic-appearing or diaphoretic.  HENT:     Head: Normocephalic and atraumatic.  Cardiovascular:     Rate and Rhythm: Normal rate.  Pulmonary:     Effort: Pulmonary effort is normal. No respiratory distress.  Abdominal:     General: Abdomen is flat.     Palpations: Abdomen is soft.     Tenderness: There is no abdominal tenderness.  Musculoskeletal:        General: Normal range of motion.     Cervical back: Normal range of motion.     Comments: No tenderness to palpation of the cervical, thoracic, or lumbar spine.  No step-offs, lesions, deformity, or overlying skin changes. 4/5 strength to bilateral lower extremities. Able to walk with a cane which is her baseline. DP and PT pulses intact and 2+. Sensation changes to the bilateral lower extremities which is chronic in nature and unchanged from baseline.  Skin:    General: Skin is warm and dry.  Neurological:     General: No focal deficit present.     Mental Status: She is alert.  Psychiatric:        Mood and Affect: Mood normal.        Behavior: Behavior normal.     ED Results / Procedures / Treatments   Labs (all labs ordered are listed, but only abnormal results are displayed) Labs Reviewed  CBC  BASIC METABOLIC PANEL WITH GFR    EKG None  Radiology CT Lumbar Spine Wo Contrast Result Date: 05/15/2023 CLINICAL DATA:  Back trauma, recent fall landed in sitting position with mid back pain. Back pain now radiating into lower back, urinary incontinence. EXAM: CT THORACIC AND LUMBAR SPINE WITHOUT CONTRAST TECHNIQUE: Multidetector CT imaging of the thoracic and lumbar spine was performed without contrast. Multiplanar CT image reconstructions were also generated. RADIATION DOSE  REDUCTION: This exam was performed according to the departmental dose-optimization program which includes automated exposure control, adjustment of the mA and/or kV according to patient size and/or use of iterative reconstruction technique. COMPARISON:  MRI thoracic spine 06/11/2021. MRI lumbar spine 08/26/2022. FINDINGS: CT THORACIC SPINE FINDINGS Alignment: Thoracic kyphosis is maintained. Trace anterolisthesis of T3 on T4 and T5 on T6. Vertebrae: No compression fracture or displaced fracture in the thoracic spine. No suspicious osseous lesions. Prominent anterior endplate osteophytes at multiple levels most pronounced from T7-8 to T10-11. Schmorl's nodes at multiple levels. Paraspinal and other soft tissues: The visualized paraspinal soft tissues are unremarkable. Mild atherosclerosis of the visualized thoracic  aorta. Disc levels: Mild disc space narrowing at multiple levels. Vacuum disc phenomenon noted from T5-6 to T10-11. No CT evidence of large disc herniation. No high-grade osseous spinal canal stenosis. Facet arthrosis throughout the thoracic spine. There is moderate foraminal stenosis on the right at T2-3. Additional mild to moderate foraminal narrowing on the right at T4-5. Additional mild foraminal narrowing at multiple levels throughout the thoracic spine. CT LUMBAR SPINE FINDINGS Segmentation: 5 lumbar type vertebrae. Alignment: Lumbar lordosis is maintained. Trace retrolisthesis of L4 on L5. Vertebrae: No compression fracture or displaced fracture in the lumbar spine. No suspicious osseous lesion. Paraspinal and other soft tissues: The visualized paraspinal soft tissues are unremarkable. Minimal scattered atherosclerosis of the visualized abdominal aorta. Disc levels: There is mild disc space narrowing at L4-5. Additional mild to moderate disc space narrowing at L5-S1. Vacuum disc phenomenon noted at the L3-4 through L5-S1 levels. Disc bulge at L2-3 without significant spinal canal stenosis. Disc  bulge and mild facet arthrosis at L3-4 resulting in mild spinal canal stenosis. Additional disc bulge at L4-5 with bilateral facet arthrosis more pronounced on the left resulting in moderate spinal canal stenosis. Disc bulge at L5-S1 without significant central spinal canal stenosis. There is possible left subarticular disc protrusion and possible narrowing of the left lateral recess. Facet arthrosis throughout the lumbar spine. Mild foraminal stenosis at L3-4 and L4-5. Mild-to-moderate spinal canal stenosis at L5-S1. IMPRESSION: CT THORACIC SPINE IMPRESSION No acute fracture or traumatic malalignment of the thoracic spine. Degenerative changes as above. CT LUMBAR SPINE IMPRESSION No acute fracture or traumatic malalignment of the lumbar spine. Degenerative changes as above. Possible left subarticular disc protrusion at L5-S1 which may result in lateral recess narrowing and possible impingement upon the traversing left S1 nerve root. Consider MRI for further evaluation. Electronically Signed   By: Denny Flack M.D.   On: 05/15/2023 14:17   CT Thoracic Spine Wo Contrast Result Date: 05/15/2023 CLINICAL DATA:  Back trauma, recent fall landed in sitting position with mid back pain. Back pain now radiating into lower back, urinary incontinence. EXAM: CT THORACIC AND LUMBAR SPINE WITHOUT CONTRAST TECHNIQUE: Multidetector CT imaging of the thoracic and lumbar spine was performed without contrast. Multiplanar CT image reconstructions were also generated. RADIATION DOSE REDUCTION: This exam was performed according to the departmental dose-optimization program which includes automated exposure control, adjustment of the mA and/or kV according to patient size and/or use of iterative reconstruction technique. COMPARISON:  MRI thoracic spine 06/11/2021. MRI lumbar spine 08/26/2022. FINDINGS: CT THORACIC SPINE FINDINGS Alignment: Thoracic kyphosis is maintained. Trace anterolisthesis of T3 on T4 and T5 on T6. Vertebrae: No  compression fracture or displaced fracture in the thoracic spine. No suspicious osseous lesions. Prominent anterior endplate osteophytes at multiple levels most pronounced from T7-8 to T10-11. Schmorl's nodes at multiple levels. Paraspinal and other soft tissues: The visualized paraspinal soft tissues are unremarkable. Mild atherosclerosis of the visualized thoracic aorta. Disc levels: Mild disc space narrowing at multiple levels. Vacuum disc phenomenon noted from T5-6 to T10-11. No CT evidence of large disc herniation. No high-grade osseous spinal canal stenosis. Facet arthrosis throughout the thoracic spine. There is moderate foraminal stenosis on the right at T2-3. Additional mild to moderate foraminal narrowing on the right at T4-5. Additional mild foraminal narrowing at multiple levels throughout the thoracic spine. CT LUMBAR SPINE FINDINGS Segmentation: 5 lumbar type vertebrae. Alignment: Lumbar lordosis is maintained. Trace retrolisthesis of L4 on L5. Vertebrae: No compression fracture or displaced fracture in the lumbar spine.  No suspicious osseous lesion. Paraspinal and other soft tissues: The visualized paraspinal soft tissues are unremarkable. Minimal scattered atherosclerosis of the visualized abdominal aorta. Disc levels: There is mild disc space narrowing at L4-5. Additional mild to moderate disc space narrowing at L5-S1. Vacuum disc phenomenon noted at the L3-4 through L5-S1 levels. Disc bulge at L2-3 without significant spinal canal stenosis. Disc bulge and mild facet arthrosis at L3-4 resulting in mild spinal canal stenosis. Additional disc bulge at L4-5 with bilateral facet arthrosis more pronounced on the left resulting in moderate spinal canal stenosis. Disc bulge at L5-S1 without significant central spinal canal stenosis. There is possible left subarticular disc protrusion and possible narrowing of the left lateral recess. Facet arthrosis throughout the lumbar spine. Mild foraminal stenosis at  L3-4 and L4-5. Mild-to-moderate spinal canal stenosis at L5-S1. IMPRESSION: CT THORACIC SPINE IMPRESSION No acute fracture or traumatic malalignment of the thoracic spine. Degenerative changes as above. CT LUMBAR SPINE IMPRESSION No acute fracture or traumatic malalignment of the lumbar spine. Degenerative changes as above. Possible left subarticular disc protrusion at L5-S1 which may result in lateral recess narrowing and possible impingement upon the traversing left S1 nerve root. Consider MRI for further evaluation. Electronically Signed   By: Denny Flack M.D.   On: 05/15/2023 14:17    Procedures Procedures    Medications Ordered in ED Medications  acetaminophen  (TYLENOL ) tablet 650 mg (has no administration in time range)  methocarbamol  (ROBAXIN ) tablet 500 mg (has no administration in time range)    ED Course/ Medical Decision Making/ A&P Clinical Course as of 05/16/23 0003  Mon May 15, 2023  2359 Endorses new perineal numbness, and new urination on herself. Didn't want pain meds. Hx of questionable MS? MR pending. Okay to go home, plus or minus steroids. [CP]    Clinical Course User Index [CP] Nelly Banco, PA-C                                 Medical Decision Making Amount and/or Complexity of Data Reviewed Labs: ordered. Radiology: ordered.  Risk OTC drugs. Prescription drug management.   This patient is a 68 y.o. female who presents to the ED for concern of back pain, this involves an extensive number of treatment options, and is a complaint that carries with it a high risk of complications and morbidity. The emergent differential diagnosis prior to evaluation includes, but is not limited to,  Fracture (acute/chronic), muscle strain, cauda equina, spinal stenosis, DDD, metastatic cancer, vertebral osteomyelitis, kidney stone, pyelonephritis, AAA, pancreatitis, bowel obstruction, meningitis.   This is not an exhaustive differential.   Past Medical History /  Co-morbidities / Social History:  has a past medical history of ADD (attention deficit disorder), Anxiety, Biliary colic, Bladder leak, Diabetes mellitus without complication (HCC), Diverticular disease, DVT (deep venous thrombosis) (HCC), Edema of both lower extremities, GERD (gastroesophageal reflux disease), Glaucoma, Heart murmur, High cholesterol, Hypertension, Hypothyroid, Leg cramps, Leg cramps, Migraines, Multiple sclerosis (HCC), Optic neuritis, OSA (obstructive sleep apnea), Paresthesias/numbness, Pneumonia (2017), Restless leg syndrome, Sleep apnea, SOB (shortness of breath), and Thyroid  disease.  Additional history: Chart reviewed.  Physical Exam: Physical exam performed. The pertinent findings include: No midline tenderness to palpation of the cervical, thoracic, or lumbar spine.  Ambulatory with a cane with your baseline.  Good distal sensation.  4/5 strength bilaterally which could be chronic.  Lab Tests: I ordered, and personally interpreted labs.  The pertinent  results include: No acute laboratory abnormalities   Imaging Studies: CT of the thoracic and lumbar spine ordered in triage.  I ordered imaging studies including MRI thoracic and lumbar spine. I independently visualized and interpreted imaging which showed   CT THORACIC SPINE IMPRESSION   No acute fracture or traumatic malalignment of the thoracic spine.   Degenerative changes as above.   CT LUMBAR SPINE IMPRESSION   No acute fracture or traumatic malalignment of the lumbar spine.   Degenerative changes as above. Possible left subarticular disc protrusion at L5-S1 which may result in lateral recess narrowing and possible impingement upon the traversing left S1 nerve root. Consider MRI for further evaluation.  MRI  1. No acute abnormality of the thoracic or lumbar spine. 2. Unchanged mild bilateral L4-5 and L5-S1 neural foraminal stenosis. 3. Unchanged narrowing of both lateral recesses at L3-4 and left lateral  recess narrowing at L4-5.  I agree with the radiologist interpretation.  Medications: I ordered medication including Robaxin  and Tylenol  for pain. Reevaluation of the patient after these medicines showed that the patient improved. I have reviewed the patients home medicines and have made adjustments as needed.   Disposition: After consideration of the diagnostic results and the patients response to treatment, I feel that emergency department workup does not suggest an emergent condition requiring admission or immediate intervention beyond what has been performed at this time. The plan is: Discharge with steroids for symptomatic relief, close outpatient neurosurgery follow-up and return precautions. MRIs are benign, therefore unlikely cause of her incontinence. She will need to discuss this with her pcp at her next appointment. Evaluation and diagnostic testing in the emergency department does not suggest an emergent condition requiring admission or immediate intervention beyond what has been performed at this time.  Plan for discharge with close PCP follow-up.  Patient is understanding and amenable with plan, educated on red flag symptoms that would prompt immediate return.  Patient discharged in stable condition.   Final Clinical Impression(s) / ED Diagnoses Final diagnoses:  Acute bilateral low back pain with bilateral sciatica    Rx / DC Orders ED Discharge Orders          Ordered    predniSONE  (DELTASONE ) 20 MG tablet  Daily        05/16/23 0019          An After Visit Summary was printed and given to the patient.     Djuan Talton A, PA-C 05/19/23 1404    Kingsley, Victoria K, DO 05/24/23 1555

## 2023-05-15 NOTE — ED Provider Triage Note (Signed)
 Emergency Medicine Provider Triage Evaluation Note  Anna Walker , a 68 y.o. female  was evaluated in triage.  Pt complains of pain in her back after fall on April 11.  She states that she stepped into an area that was sunken that she did not expect and she fell, grabbing onto things, and landed in the sitting position in a chair.  She had a large bruise on her left hand and was seen at the urgent care that day.  The next day she noted mid back pain which she thought might have been due to being sitting in the chair at the urgent care.  She has continued to have back pain states it now radiates down to her lower back and reports that she is having difficulty controlling her bladder and feels that she has some numbness in the perineal area has been taking methocarbamol  for pain..  Review of Systems  Positive: Pain in mid and low back, loss of urinary control without frequency or pain Negative: Fever, chills, nausea vomiting, head injury, blood thinners  Physical Exam  BP (!) 136/58 (BP Location: Right Arm)   Pulse 64   Temp 98.4 F (36.9 C)   Resp 17   Ht 1.676 m (5\' 6" )   Wt 120.2 kg   SpO2 96%   BMI 42.77 kg/m  Gen:   Awake, no distress   Resp:  Normal effort  MSK:   Moves extremities without difficulty  Other:  No sensory deficit noted on rapid exam of lower extremities, good bilateral leg strength  Medical Decision Making  Medically screening exam initiated at 11:20 AM.  Appropriate orders placed.  KAMDEN REBER was informed that the remainder of the evaluation will be completed by another provider, this initial triage assessment does not replace that evaluation, and the importance of remaining in the ED until their evaluation is complete.  Plan CT thoracic and lumbar spine   Auston Blush, MD 05/15/23 1121

## 2023-05-15 NOTE — ED Provider Notes (Incomplete)
 [place] [facility] Provider Note   CSN: [id number] Arrival date & time: 05/15/23  1007     History {Add pertinent medical, surgical, social history, OB history to HPI:1} Chief Complaint  Patient presents with  . Back Pain    Anna Walker is a 68 y.o. female.  Patient with history of diabetes, DVT, hypertension, hyperlipidemia, OSA presents today with complaints of back pain. She states that on 4/11 she had a mechanical fall where she stepped into a sunken area in the ground and lost her balance and fell into a chair on her bottom. Initially only had left hand pain and was seen at urgent care for same. Woke up the next morning with middle and low back pain that has been persistently worsening since then. She saw orthopedics for this pain and was given robaxin  which she has been taking with some improvement. States they had planned for her to have an MRI of her back outpatient later this week. However over the last few days the pain has worsened and has moved lower in her back. States the pain radiates down the outside of both her legs, pain is worse in the left leg than the right. Denies numbness in her legs, does note some sensation changes in the perineal area. She also has had difficulty controlling her bladder today and has urinated on herself. This is not a normal problem for her. Denies any fevers or chills. She is able to walk with a cane which is her baseline.   The history is provided by the patient. No language interpreter was used.  Back Pain      Home Medications Prior to Admission medications   Medication Sig Start Date End Date Taking? Authorizing Provider  albuterol (VENTOLIN HFA) 108 (90 Base) MCG/ACT inhaler Inhale 1 puff into the lungs every 4 (four) hours as needed. 02/14/22   [provider]  amantadine  (SYMMETREL ) 100 MG capsule TAKE 2 CAPSULES BY MOUTH DAILY. 03/23/23   Merriam Abbey, DO  Dulaglutide  (TRULICITY ) 0.75  MG/0.5ML SOAJ ADMINISTER 0.75 MG UNDER THE SKIN 1 TIME A WEEK Patient not taking: Reported on 03/10/2023 09/16/21   [provider]  FLUoxetine  (PROZAC ) 20 MG capsule Take 40 mg by mouth at bedtime. 06/21/18   [provider]  gabapentin  (NEURONTIN ) 300 MG capsule Take 1 capsule at dinner and 2 capsules at bedtime. 04/26/23   Merriam Abbey, DO  hydrochlorothiazide  (HYDRODIURIL ) 12.5 MG tablet Take 12.5 mg by mouth daily.  02/06/17   [provider]  levothyroxine  (SYNTHROID ) 137 MCG tablet Take 137 mcg by mouth daily before breakfast.    [provider]  meloxicam  (MOBIC ) 15 MG tablet  06/15/20   [provider]  metFORMIN (GLUCOPHAGE) 500 MG tablet Take 500 mg by mouth daily with breakfast. 11/07/22   [provider]  methocarbamol  (ROBAXIN ) 500 MG tablet Take 500-1,000 mg by mouth every 6 (six) hours as needed. 01/21/23   [provider]  naproxen  (NAPROSYN ) 500 MG tablet Take 1 tablet (500 mg total) by mouth 2 (two) times daily with a meal. 05/05/23   Maryruth Sol, FNP  rOPINIRole  (REQUIP ) 3 MG tablet TAKE 1 TABLET BY MOUTH AT [facility] AND AT BEDTIME AS NEEDED 01/17/23   Merriam Abbey, DO  rosuvastatin (CRESTOR) 10 MG tablet Take 10 mg by mouth daily. 03/14/22   [provider]  Vitamin D , Ergocalciferol , (DRISDOL ) 1.25 MG (50000 UNIT) CAPS capsule Take 50,000 Units by  mouth 2 (two) times a week. Tues, Thur    [provider]      Allergies    Adderall [amphetamine -dextroamphetamine] and Topamax  [topiramate ]    Review of Systems   Review of Systems  Musculoskeletal:  Positive for back pain.    Physical Exam Updated Vital Signs BP 127/87 (BP Location: Right Arm)   Pulse 80   Temp 98.1 F (36.7 C) (Oral)   Resp 17   Ht 5\' 6"  (1.676 m)   Wt 120.2 kg   SpO2 99%   BMI 42.77 kg/m  Physical Exam Vitals and nursing note reviewed.  Constitutional:      General: She is not in acute distress.    Appearance:  Normal appearance. She is normal weight. She is not ill-appearing, toxic-appearing or diaphoretic.  HENT:     Head: Normocephalic and atraumatic.  Cardiovascular:     Rate and Rhythm: Normal rate.  Pulmonary:     Effort: Pulmonary effort is normal. No respiratory distress.  Abdominal:     General: Abdomen is flat.     Palpations: Abdomen is soft.     Tenderness: There is no abdominal tenderness.  Musculoskeletal:        General: Normal range of motion.     Cervical back: Normal range of motion.     Comments: No tenderness to palpation of the cervical, thoracic, or lumbar spine.  No step-offs, lesions, deformity, or overlying skin changes. 4/5 strength to bilateral lower extremities. Able to walk with a cane which is her baseline. DP and PT pulses intact and 2+. Sensation intact to bilateral lower extremities.  Skin:    General: Skin is warm and dry.  Neurological:     General: No focal deficit present.     Mental Status: She is alert.  Psychiatric:        Mood and Affect: Mood normal.        Behavior: Behavior normal.     ED Results / Procedures / Treatments   Labs (all labs ordered are listed, but only abnormal results are displayed) Labs Reviewed  CBC  BASIC METABOLIC PANEL WITH GFR    EKG None  Radiology CT Lumbar Spine Wo Contrast Result Date: 05/15/2023 CLINICAL DATA:  Back trauma, recent fall landed in sitting position with mid back pain. Back pain now radiating into lower back, urinary incontinence. EXAM: CT THORACIC AND LUMBAR SPINE WITHOUT CONTRAST TECHNIQUE: Multidetector CT imaging of the thoracic and lumbar spine was performed without contrast. Multiplanar CT image reconstructions were also generated. RADIATION DOSE REDUCTION: This exam was performed according to the departmental dose-optimization program which includes automated exposure control, adjustment of the mA and/or kV according to patient size and/or use of iterative reconstruction technique. COMPARISON:   MRI thoracic spine 06/11/2021. MRI lumbar spine 08/26/2022. FINDINGS: CT THORACIC SPINE FINDINGS Alignment: Thoracic kyphosis is maintained. Trace anterolisthesis of T3 on T4 and T5 on T6. Vertebrae: No compression fracture or displaced fracture in the thoracic spine. No suspicious osseous lesions. Prominent anterior endplate osteophytes at multiple levels most pronounced from T7-8 to T10-11. Schmorl's nodes at multiple levels. Paraspinal and other soft tissues: The visualized paraspinal soft tissues are unremarkable. Mild atherosclerosis of the visualized thoracic aorta. Disc levels: Mild disc space narrowing at multiple levels. Vacuum disc phenomenon noted from T5-6 to T10-11. No CT evidence of large disc herniation. No high-grade osseous spinal canal stenosis. Facet arthrosis throughout the thoracic spine. There is moderate foraminal stenosis on the right at T2-3. Additional mild  to moderate foraminal narrowing on the right at T4-5. Additional mild foraminal narrowing at multiple levels throughout the thoracic spine. CT LUMBAR SPINE FINDINGS Segmentation: 5 lumbar type vertebrae. Alignment: Lumbar lordosis is maintained. Trace retrolisthesis of L4 on L5. Vertebrae: No compression fracture or displaced fracture in the lumbar spine. No suspicious osseous lesion. Paraspinal and other soft tissues: The visualized paraspinal soft tissues are unremarkable. Minimal scattered atherosclerosis of the visualized abdominal aorta. Disc levels: There is mild disc space narrowing at L4-5. Additional mild to moderate disc space narrowing at L5-S1. Vacuum disc phenomenon noted at the L3-4 through L5-S1 levels. Disc bulge at L2-3 without significant spinal canal stenosis. Disc bulge and mild facet arthrosis at L3-4 resulting in mild spinal canal stenosis. Additional disc bulge at L4-5 with bilateral facet arthrosis more pronounced on the left resulting in moderate spinal canal stenosis. Disc bulge at L5-S1 without significant  central spinal canal stenosis. There is possible left subarticular disc protrusion and possible narrowing of the left lateral recess. Facet arthrosis throughout the lumbar spine. Mild foraminal stenosis at L3-4 and L4-5. Mild-to-moderate spinal canal stenosis at L5-S1. IMPRESSION: CT THORACIC SPINE IMPRESSION No acute fracture or traumatic malalignment of the thoracic spine. Degenerative changes as above. CT LUMBAR SPINE IMPRESSION No acute fracture or traumatic malalignment of the lumbar spine. Degenerative changes as above. Possible left subarticular disc protrusion at L5-S1 which may result in lateral recess narrowing and possible impingement upon the traversing left S1 nerve root. Consider MRI for further evaluation. Electronically Signed   By: Denny Flack M.D.   On: 05/15/2023 14:17   CT Thoracic Spine Wo Contrast Result Date: 05/15/2023 CLINICAL DATA:  Back trauma, recent fall landed in sitting position with mid back pain. Back pain now radiating into lower back, urinary incontinence. EXAM: CT THORACIC AND LUMBAR SPINE WITHOUT CONTRAST TECHNIQUE: Multidetector CT imaging of the thoracic and lumbar spine was performed without contrast. Multiplanar CT image reconstructions were also generated. RADIATION DOSE REDUCTION: This exam was performed according to the departmental dose-optimization program which includes automated exposure control, adjustment of the mA and/or kV according to patient size and/or use of iterative reconstruction technique. COMPARISON:  MRI thoracic spine 06/11/2021. MRI lumbar spine 08/26/2022. FINDINGS: CT THORACIC SPINE FINDINGS Alignment: Thoracic kyphosis is maintained. Trace anterolisthesis of T3 on T4 and T5 on T6. Vertebrae: No compression fracture or displaced fracture in the thoracic spine. No suspicious osseous lesions. Prominent anterior endplate osteophytes at multiple levels most pronounced from T7-8 to T10-11. Schmorl's nodes at multiple levels. Paraspinal and other soft  tissues: The visualized paraspinal soft tissues are unremarkable. Mild atherosclerosis of the visualized thoracic aorta. Disc levels: Mild disc space narrowing at multiple levels. Vacuum disc phenomenon noted from T5-6 to T10-11. No CT evidence of large disc herniation. No high-grade osseous spinal canal stenosis. Facet arthrosis throughout the thoracic spine. There is moderate foraminal stenosis on the right at T2-3. Additional mild to moderate foraminal narrowing on the right at T4-5. Additional mild foraminal narrowing at multiple levels throughout the thoracic spine. CT LUMBAR SPINE FINDINGS Segmentation: 5 lumbar type vertebrae. Alignment: Lumbar lordosis is maintained. Trace retrolisthesis of L4 on L5. Vertebrae: No compression fracture or displaced fracture in the lumbar spine. No suspicious osseous lesion. Paraspinal and other soft tissues: The visualized paraspinal soft tissues are unremarkable. Minimal scattered atherosclerosis of the visualized abdominal aorta. Disc levels: There is mild disc space narrowing at L4-5. Additional mild to moderate disc space narrowing at L5-S1. Vacuum disc phenomenon noted at the  L3-4 through L5-S1 levels. Disc bulge at L2-3 without significant spinal canal stenosis. Disc bulge and mild facet arthrosis at L3-4 resulting in mild spinal canal stenosis. Additional disc bulge at L4-5 with bilateral facet arthrosis more pronounced on the left resulting in moderate spinal canal stenosis. Disc bulge at L5-S1 without significant central spinal canal stenosis. There is possible left subarticular disc protrusion and possible narrowing of the left lateral recess. Facet arthrosis throughout the lumbar spine. Mild foraminal stenosis at L3-4 and L4-5. Mild-to-moderate spinal canal stenosis at L5-S1. IMPRESSION: CT THORACIC SPINE IMPRESSION No acute fracture or traumatic malalignment of the thoracic spine. Degenerative changes as above. CT LUMBAR SPINE IMPRESSION No acute fracture or  traumatic malalignment of the lumbar spine. Degenerative changes as above. Possible left subarticular disc protrusion at L5-S1 which may result in lateral recess narrowing and possible impingement upon the traversing left S1 nerve root. Consider MRI for further evaluation. Electronically Signed   By: Denny Flack M.D.   On: 05/15/2023 14:17    Procedures Procedures  {Document cardiac monitor, telemetry assessment procedure when appropriate:1}  Medications Ordered in ED Medications  acetaminophen  (TYLENOL ) tablet 650 mg (has no administration in time range)  methocarbamol  (ROBAXIN ) tablet 500 mg (has no administration in time range)    ED Course/ Medical Decision Making/ A&P Clinical Course as of 05/16/23 0003  Mon May 15, 2023  2359 Endorses new perineal numbness, and new urination on herself. Didn't want pain meds. Hx of questionable MS? MR pending. Okay to go home, plus or minus steroids. [CP]    Clinical Course User Index [CP] Prosperi, Christian H, PA-C   {   Click here for ABCD2, HEART and other calculatorsREFRESH Note before signing :1}                              Medical Decision Making Amount and/or Complexity of Data Reviewed Labs: ordered. Radiology: ordered.  Risk OTC drugs. Prescription drug management.   This patient is a 68 y.o. female who presents to the ED for concern of back pain, this involves an extensive number of treatment options, and is a complaint that carries with it a high risk of complications and morbidity. The emergent differential diagnosis prior to evaluation includes, but is not limited to,  Fracture (acute/chronic), muscle strain, cauda equina, spinal stenosis, DDD, metastatic cancer, vertebral osteomyelitis, kidney stone, pyelonephritis, AAA, pancreatitis, bowel obstruction, meningitis.   This is not an exhaustive differential.   Past Medical History / Co-morbidities / Social History:  has a past medical history of ADD (attention deficit  disorder), Anxiety, Biliary colic, Bladder leak, Diabetes mellitus without complication (HCC), Diverticular disease, DVT (deep venous thrombosis) (HCC), Edema of both lower extremities, GERD (gastroesophageal reflux disease), Glaucoma, Heart murmur, High cholesterol, Hypertension, Hypothyroid, Leg cramps, Leg cramps, Migraines, Multiple sclerosis (HCC), Optic neuritis, OSA (obstructive sleep apnea), Paresthesias/numbness, Pneumonia (2017), Restless leg syndrome, Sleep apnea, SOB (shortness of breath), and Thyroid  disease.  Additional history: Chart reviewed.  Physical Exam: Physical exam performed. The pertinent findings include: No midline tenderness to palpation of the cervical, thoracic, or lumbar spine.  Ambulatory with a cane with your baseline.  Good distal sensation.  4/5 strength bilaterally which could be chronic.  Lab Tests: I ordered, and personally interpreted labs.  The pertinent results include: No acute laboratory abnormalities   Imaging Studies: CT of the thoracic and lumbar spine ordered in triage.  I ordered imaging studies including MRI thoracic  and lumbar spine. I independently visualized and interpreted imaging which showed   CT THORACIC SPINE IMPRESSION   No acute fracture or traumatic malalignment of the thoracic spine.   Degenerative changes as above.   CT LUMBAR SPINE IMPRESSION   No acute fracture or traumatic malalignment of the lumbar spine.   Degenerative changes as above. Possible left subarticular disc protrusion at L5-S1 which may result in lateral recess narrowing and possible impingement upon the traversing left S1 nerve root. Consider MRI for further evaluation.  I agree with the radiologist interpretation.  MRI is pending at shift change.   Medications: I ordered medication including ***  for ***. Reevaluation of the patient after these medicines showed that the patient {resolved/improved/worsened:23923::"improved"}. I have reviewed the patients  home medicines and have made adjustments as needed.  Consultations Obtained: I requested consultation with the ***,  and discussed lab and imaging findings as well as pertinent plan - they recommend: ***   Disposition: After consideration of the diagnostic results and the patients response to treatment, I feel that *** .   ***emergency department workup does not suggest an emergent condition requiring admission or immediate intervention beyond what has been performed at this time. The plan is: ***. The patient is safe for discharge and has been instructed to return immediately for worsening symptoms, change in symptoms or any other concerns.  I discussed this case with my attending physician Dr. Aaron Aas who cosigned this note including patient's presenting symptoms, physical exam, and planned diagnostics and interventions. Attending physician stated agreement with plan or made changes to plan which were implemented.     {Document critical care time when appropriate:1} {Document review of labs and clinical decision tools ie heart score, Chads2Vasc2 etc:1}  {Document your independent review of radiology images, and any outside records:1} {Document your discussion with family members, caretakers, and with consultants:1} {Document social determinants of health affecting pt's care:1} {Document your decision making why or why not admission, treatments were needed:1} Final Clinical Impression(s) / ED Diagnoses Final diagnoses:  None    Rx / DC Orders ED Discharge Orders     None

## 2023-05-15 NOTE — ED Notes (Signed)
 Patient transported to MRI

## 2023-05-16 MED ORDER — PREDNISONE 20 MG PO TABS: 40.0000 mg | ORAL_TABLET | Freq: Every day | ORAL | 0 refills | Status: DC

## 2023-05-16 NOTE — Discharge Instructions (Addendum)
 Please use Tylenol  or ibuprofen for pain.  You may use 600 mg ibuprofen every 6 hours or 1000 mg of Tylenol  every 6 hours.  You may choose to alternate between the 2.  This would be most effective.  Not to exceed 4 g of Tylenol  within 24 hours.  Not to exceed 3200 mg ibuprofen 24 hours.  Please take the entire course of steroids that we have prescribed, you can follow up with the neurosurgeon whose contact information I provided above.   You can use the muscle relaxant you have at home in addition to the above to help with any breakthrough pain.  You can take it up to twice daily.  It is safe to take at night, but I would be cautious taking it during the day as it can cause some drowsiness.  Make sure that you are feeling awake and alert before you get behind the wheel of a car or operate a motor vehicle.  It is not a narcotic pain medication so you are able to take it if it is not making you drowsy and still pilot a vehicle or machinery safely.

## 2023-05-23 DIAGNOSIS — M545 Low back pain, unspecified: Secondary | ICD-10-CM | POA: Diagnosis not present

## 2023-06-09 DIAGNOSIS — M47816 Spondylosis without myelopathy or radiculopathy, lumbar region: Secondary | ICD-10-CM | POA: Diagnosis not present

## 2023-06-13 DIAGNOSIS — M47816 Spondylosis without myelopathy or radiculopathy, lumbar region: Secondary | ICD-10-CM | POA: Diagnosis not present

## 2023-06-21 DIAGNOSIS — M79604 Pain in right leg: Secondary | ICD-10-CM | POA: Diagnosis not present

## 2023-06-22 ENCOUNTER — Other Ambulatory Visit (HOSPITAL_COMMUNITY): Payer: Self-pay | Admitting: Family Medicine

## 2023-06-22 ENCOUNTER — Ambulatory Visit (HOSPITAL_COMMUNITY)
Admission: RE | Admit: 2023-06-22 | Discharge: 2023-06-22 | Disposition: A | Discharge status: Home / Self Care | Source: Ambulatory Visit | Attending: Vascular Surgery | Admitting: Vascular Surgery

## 2023-06-22 DIAGNOSIS — M79604 Pain in right leg: Secondary | ICD-10-CM | POA: Diagnosis not present

## 2023-06-30 ENCOUNTER — Other Ambulatory Visit: Payer: Self-pay | Admitting: Neurology

## 2023-06-30 DIAGNOSIS — M1711 Unilateral primary osteoarthritis, right knee: Secondary | ICD-10-CM | POA: Diagnosis not present

## 2023-06-30 DIAGNOSIS — M79642 Pain in left hand: Secondary | ICD-10-CM | POA: Diagnosis not present

## 2023-07-10 DIAGNOSIS — R7303 Prediabetes: Secondary | ICD-10-CM | POA: Diagnosis not present

## 2023-07-10 DIAGNOSIS — M47816 Spondylosis without myelopathy or radiculopathy, lumbar region: Secondary | ICD-10-CM | POA: Diagnosis not present

## 2023-07-10 DIAGNOSIS — M5416 Radiculopathy, lumbar region: Secondary | ICD-10-CM | POA: Diagnosis not present

## 2023-08-03 DIAGNOSIS — Z6841 Body Mass Index (BMI) 40.0 and over, adult: Secondary | ICD-10-CM | POA: Diagnosis not present

## 2023-08-03 DIAGNOSIS — E162 Hypoglycemia, unspecified: Secondary | ICD-10-CM | POA: Diagnosis not present

## 2023-08-03 DIAGNOSIS — R7303 Prediabetes: Secondary | ICD-10-CM | POA: Diagnosis not present

## 2023-08-17 ENCOUNTER — Emergency Department (HOSPITAL_COMMUNITY)

## 2023-08-17 ENCOUNTER — Ambulatory Visit (HOSPITAL_COMMUNITY)
Admission: RE | Admit: 2023-08-17 | Discharge: 2023-08-17 | Disposition: A | Discharge status: Home / Self Care | Source: Ambulatory Visit | Attending: Family Medicine | Admitting: Family Medicine

## 2023-08-17 ENCOUNTER — Encounter (HOSPITAL_COMMUNITY): Payer: Self-pay

## 2023-08-17 ENCOUNTER — Other Ambulatory Visit: Payer: Self-pay

## 2023-08-17 ENCOUNTER — Encounter (HOSPITAL_COMMUNITY): Payer: Self-pay | Admitting: Emergency Medicine

## 2023-08-17 ENCOUNTER — Emergency Department (HOSPITAL_COMMUNITY)
Admission: EM | Admit: 2023-08-17 | Discharge: 2023-08-17 | Disposition: A | Discharge status: Home / Self Care | Source: Ambulatory Visit | Attending: Emergency Medicine | Admitting: Emergency Medicine

## 2023-08-17 VITALS — BP 122/79 | HR 73 | Temp 97.9°F | Resp 18

## 2023-08-17 DIAGNOSIS — R1032 Left lower quadrant pain: Secondary | ICD-10-CM

## 2023-08-17 DIAGNOSIS — K5792 Diverticulitis of intestine, part unspecified, without perforation or abscess without bleeding: Secondary | ICD-10-CM

## 2023-08-17 DIAGNOSIS — Z8719 Personal history of other diseases of the digestive system: Secondary | ICD-10-CM

## 2023-08-17 DIAGNOSIS — E039 Hypothyroidism, unspecified: Secondary | ICD-10-CM | POA: Insufficient documentation

## 2023-08-17 DIAGNOSIS — I10 Essential (primary) hypertension: Secondary | ICD-10-CM | POA: Insufficient documentation

## 2023-08-17 DIAGNOSIS — K5732 Diverticulitis of large intestine without perforation or abscess without bleeding: Secondary | ICD-10-CM | POA: Insufficient documentation

## 2023-08-17 DIAGNOSIS — E119 Type 2 diabetes mellitus without complications: Secondary | ICD-10-CM | POA: Insufficient documentation

## 2023-08-17 LAB — LIPASE, BLOOD: Lipase: 27 U/L (ref 11–51)

## 2023-08-17 LAB — COMPREHENSIVE METABOLIC PANEL WITH GFR
ALT: 19 U/L (ref 0–44)
AST: 17 U/L (ref 15–41)
Albumin: 3.6 g/dL (ref 3.5–5.0)
Alkaline Phosphatase: 79 U/L (ref 38–126)
Anion gap: 8 (ref 5–15)
BUN: 10 mg/dL (ref 8–23)
CO2: 27 mmol/L (ref 22–32)
Calcium: 9 mg/dL (ref 8.9–10.3)
Chloride: 104 mmol/L (ref 98–111)
Creatinine, Ser: 0.79 mg/dL (ref 0.44–1.00)
GFR, Estimated: >60 mL/min (ref >60)
Glucose, Bld: 101 mg/dL — ABNORMAL HIGH (ref 70–99)
Potassium: 4.2 mmol/L (ref 3.5–5.1)
Sodium: 139 mmol/L (ref 135–145)
Total Bilirubin: 0.9 mg/dL (ref 0.0–1.2)
Total Protein: 6.8 g/dL (ref 6.5–8.1)

## 2023-08-17 LAB — CBC
HCT: 38.4 % (ref 36.0–46.0)
Hemoglobin: 12.3 g/dL (ref 12.0–15.0)
MCH: 28 pg (ref 26.0–34.0)
MCHC: 32 g/dL (ref 30.0–36.0)
MCV: 87.5 fL (ref 80.0–100.0)
Platelets: 244 K/uL (ref 150–400)
RBC: 4.39 MIL/uL (ref 3.87–5.11)
RDW: 13.2 % (ref 11.5–15.5)
WBC: 7.3 K/uL (ref 4.0–10.5)
nRBC: 0 % (ref 0.0–0.2)

## 2023-08-17 MED ORDER — AMOXICILLIN-POT CLAVULANATE 875-125 MG PO TABS: 1.0000 | ORAL_TABLET | Freq: Two times a day (BID) | ORAL | 0 refills | Status: AC

## 2023-08-17 MED ORDER — AMOXICILLIN-POT CLAVULANATE 875-125 MG PO TABS
1.0000 | ORAL_TABLET | Freq: Once | ORAL | Status: AC
  Administered 2023-08-17: 1 via ORAL
  Filled 2023-08-17: qty 1

## 2023-08-17 MED ORDER — SODIUM CHLORIDE 0.9 % IV BOLUS
500.0000 mL | Freq: Once | INTRAVENOUS | Status: AC
  Administered 2023-08-17: 500 mL via INTRAVENOUS

## 2023-08-17 MED ORDER — IOHEXOL 350 MG/ML SOLN
75.0000 mL | Freq: Once | INTRAVENOUS | Status: AC | PRN
  Administered 2023-08-17: 75 mL via INTRAVENOUS

## 2023-08-17 MED ORDER — ONDANSETRON 4 MG PO TBDP
4.0000 mg | ORAL_TABLET | Freq: Three times a day (TID) | ORAL | 0 refills | Status: AC | PRN
Start: 2023-08-17 — End: ?

## 2023-08-17 NOTE — ED Notes (Signed)
 Awaiting patient from lobby.

## 2023-08-17 NOTE — ED Provider Notes (Signed)
 [facility] [place]  Note:  This document was prepared using Dragon voice recognition software and may include unintentional dictation errors.  MRN: [id number] DOB: 09/03/1955  Subjective:   Anna Walker is a 68 y.o. female presenting for severe left lower quadrant abdominal pain x 2 days.  Patient reports past history of diverticuliti with similar  manifestation to current symptoms.  Patient denies taking any over-the-counter medication to treat symptoms.  Patient reports that pain is extremely sharp and right in the left lower quadrant does not move and does not radiate to her left lower back.  Patient denies any fever or significant diarrhea or other abdominal symptoms.  No shortness of breath, chest pain, weakness, dizziness  No current facility-administered medications for this encounter.  Current Outpatient Medications:    albuterol (VENTOLIN HFA) 108 (90 Base) MCG/ACT inhaler, Inhale 1 puff into the lungs every 4 (four) hours as needed., Disp: , Rfl:    amantadine  (SYMMETREL ) 100 MG capsule, TAKE 2 CAPSULES BY MOUTH DAILY., Disp: 180 capsule, Rfl: 0   Dulaglutide  (TRULICITY ) 0.75 MG/0.5ML SOAJ, ADMINISTER 0.75 MG UNDER THE SKIN 1 TIME A WEEK (Patient not taking: Reported on 03/10/2023), Disp: , Rfl:    FLUoxetine  (PROZAC ) 20 MG capsule, Take 40 mg by mouth at bedtime., Disp: , Rfl:    gabapentin  (NEURONTIN ) 300 MG capsule, Take 1 capsule at dinner and 2 capsules at bedtime., Disp: 270 capsule, Rfl: 1   hydrochlorothiazide  (HYDRODIURIL ) 12.5 MG tablet, Take 12.5 mg by mouth daily. , Disp: , Rfl:    levothyroxine  (SYNTHROID ) 137 MCG tablet, Take 137 mcg by mouth daily before breakfast., Disp: , Rfl:    meloxicam  (MOBIC ) 15 MG tablet, , Disp: , Rfl:    metFORMIN (GLUCOPHAGE) 500 MG tablet, Take 500 mg by mouth daily with breakfast., Disp: , Rfl:    methocarbamol  (ROBAXIN ) 500 MG tablet, Take 500-1,000 mg by mouth every 6 (six) hours as needed., Disp: , Rfl:    naproxen   (NAPROSYN ) 500 MG tablet, Take 1 tablet (500 mg total) by mouth 2 (two) times daily with a meal., Disp: 20 tablet, Rfl: 0   rOPINIRole  (REQUIP ) 3 MG tablet, TAKE 1 TABLET BY MOUTH AT DINNER AND AT BEDTIME AS NEEDED, Disp: 180 tablet, Rfl: 1   rosuvastatin (CRESTOR) 10 MG tablet, Take 10 mg by mouth daily., Disp: , Rfl:    Vitamin D , Ergocalciferol , (DRISDOL ) 1.25 MG (50000 UNIT) CAPS capsule, Take 50,000 Units by mouth 2 (two) times a week. Tues, Thur, Disp: , Rfl:    Allergies  Allergen Reactions   Adderall [Amphetamine -Dextroamphetamine] Other (See Comments)    Caused elevated BP   Topamax  [Topiramate ]     dizzy    Past Medical History:  Diagnosis Date   ADD (attention deficit disorder)    Anxiety    Biliary colic    Bladder leak    Diabetes mellitus without complication (HCC)    type 2 , diet controlled patient not taking any medications   Diverticular disease    DVT (deep venous thrombosis) (HCC)    left leg , resolved after treatment of blood thinners    Edema of both lower extremities    GERD (gastroesophageal reflux disease)    causes coughing    Glaucoma    Heart murmur    High cholesterol    Hypertension    Hypothyroid    Leg cramps    in my thighs    Leg cramps    Migraines    Multiple  sclerosis (HCC)    per patient  that ha sbeen ruled out , they though it was that initially but it did not preogress like it   Optic neuritis    lesion on right optic nerve; right eye only , reduced vision and blurriness , loss of depth perception, glasses do not help when it flares     OSA (obstructive sleep apnea)    nightly cpap    Paresthesias/numbness    fingers and toes    Pneumonia 2017   Restless leg syndrome    Sleep apnea    SOB (shortness of breath)    Thyroid  disease    Hypothyroidism     Past Surgical History:  Procedure Laterality Date   ABDOMINAL HYSTERECTOMY     ANTERIOR CERVICAL DECOMPRESSION/DISCECTOMY FUSION 4 LEVELS N/A 07/14/2021   Procedure:  ANTERIOR CERVICAL DECOMPRESSION FUSION CERVICAL THREE- CERVICAL FOUR, CERVICAL FOUR- CERVICAL FIVE, CERVICAL FIVE- CERVICAL SIX WITH INSTRUMENTATION AND ALLOGRAFT;  Surgeon: Beuford Anes, MD;  Location: [facility] OR;  Service: Orthopedics;  Laterality: N/A;   BREAST LUMPECTOMY Left    CESAREAN SECTION     x2    CHOLECYSTECTOMY N/A 08/16/2018   Procedure: LAPAROSCOPIC CHOLECYSTECTOMY;  Surgeon: Tanda Locus, MD;  Location: [facility];  Service: General;  Laterality: N/A;   COLONOSCOPY WITH PROPOFOL  N/A 01/03/2019   Procedure: COLONOSCOPY WITH PROPOFOL ;  Surgeon: Lennard Lesta FALCON, MD;  Location: [facility];  Service: Endoscopy;  Laterality: N/A;   MENISCUS REPAIR Left left   MENISCUS REPAIR Right    knee    TUBAL LIGATION     during 2nd c section     Family History  Problem Relation Age of Onset   Barrett's esophagus Mother    Hypertension Mother    Obesity Mother    Yvone' disease Sister    Rheum arthritis Sister    Heart attack Father 48   Sudden death Father    Coronary artery disease Neg Hx     Social History   Tobacco Use   Smoking status: Former    Current packs/day: 0.00    Types: Cigarettes    Quit date: 01/25/1984    Years since quitting: 39.5   Smokeless tobacco: Never  Vaping Use   Vaping status: Never Used  Substance Use Topics   Alcohol use: Not Currently   Drug use: No    ROS Refer to HPI for ROS details.  Objective:   Vitals: BP 122/79 (BP Location: Right Arm)   Pulse 73   Temp 97.9 F (36.6 C) (Oral)   Resp 18   SpO2 96%   Physical Exam Vitals and nursing note reviewed.  Constitutional:      General: She is not in acute distress.    Appearance: Normal appearance. She is well-developed. She is not ill-appearing or toxic-appearing.  HENT:     Head: Normocephalic and atraumatic.  Cardiovascular:     Rate and Rhythm: Normal rate.  Pulmonary:     Effort: Pulmonary effort is normal. No respiratory distress.  Abdominal:     General: There is no  distension.     Tenderness: There is abdominal tenderness in the left lower quadrant. There is guarding. There is no right CVA tenderness, left CVA tenderness or rebound.  Musculoskeletal:        General: Normal range of motion.  Skin:    General: Skin is warm and dry.  Neurological:     General: No focal deficit present.     Mental Status:  She is alert and oriented to person, place, and time.  Psychiatric:        Mood and Affect: Mood normal.        Behavior: Behavior normal.     Procedures  No results found for this or any previous visit (from the past 24 hours).  No results found.   Assessment and Plan :     Discharge Instructions       1. Left lower quadrant abdominal pain (Primary) 2. History of diverticulitis - Based on past history of diverticulitis with similar symptoms to your current manifestation of symptoms.  Recommend follow-up in the ER for further evaluation and management. - Proper evaluation requires advanced imaging and stat laboratory testing which are unavailable in urgent care. - Please go to emergency department after leaving urgent care for further evaluation and management. - Recommend rule out of diverticulitis versus other intraabdominal pathology consistent with left lower quadrant abdominal pain.      Yvon Mccord B Stran Raper   Skylan Lara, [place], [place] 08/17/23 [phone]

## 2023-08-17 NOTE — ED Notes (Signed)
 Pt is aware of need void pt unable to void at this time

## 2023-08-17 NOTE — ED Notes (Signed)
 Patient is being discharged from the Urgent Care and sent to the Emergency Department via POV . Per Hosey, NP, patient is in need of higher level of care due to LLQ pain. Patient is aware and verbalizes understanding of plan of care.  Vitals:   08/17/23 1618  BP: 122/79  Pulse: 73  Resp: 18  Temp: 97.9 F (36.6 C)  SpO2: 96%

## 2023-08-17 NOTE — ED Triage Notes (Signed)
 Pt reports LLQ abdominal pain and diarrhea x 3 days. Denies fevers at home. Hx of diverticulitis.

## 2023-08-17 NOTE — Discharge Instructions (Signed)
 You were found to have diverticulitis on CT scan today.  I am sending some antibiotics to your pharmacy.  Please take as directed.  May take over-the-counter pain medications as needed.  I have also called in some medicine for nausea should that develop.  Please reach out to your primary care doctor for close follow-up appointment.  They may refer you to gastroenterology for follow-up colonoscopy depending on when your last one was.  You develop worsening abdominal pain, fever, other sudden/severe symptoms please return to the emergency department for reevaluation.

## 2023-08-17 NOTE — ED Provider Notes (Signed)
 Emergency Department Provider Note   I have reviewed the triage vital signs and the nursing notes.   HISTORY  Chief Complaint Abdominal Pain   HPI Anna Walker is a 68 y.o. female with past history reviewed below presents emergency department left lower quadrant abdominal pain.  Symptoms been ongoing for the past 3 days.  She has associated nonbloody diarrhea without fever.  She has had similar pain in the past with diverticulitis.  No fevers.  No vomiting.  No chest pain or shortness of breath.  With return of familiar pain she presents to the ED for evaluation.   Past Medical History:  Diagnosis Date   ADD (attention deficit disorder)    Anxiety    Biliary colic    Bladder leak    Diabetes mellitus without complication (HCC)    type 2 , diet controlled patient not taking any medications   Diverticular disease    DVT (deep venous thrombosis) (HCC)    left leg , resolved after treatment of blood thinners    Edema of both lower extremities    GERD (gastroesophageal reflux disease)    causes coughing    Glaucoma    Heart murmur    High cholesterol    Hypertension    Hypothyroid    Leg cramps    in my thighs    Leg cramps    Migraines    Multiple sclerosis (HCC)    per patient  that ha sbeen ruled out , they though it was that initially but it did not preogress like it   Optic neuritis    lesion on right optic nerve; right eye only , reduced vision and blurriness , loss of depth perception, glasses do not help when it flares     OSA (obstructive sleep apnea)    nightly cpap    Paresthesias/numbness    fingers and toes    Pneumonia 2017   Restless leg syndrome    Sleep apnea    SOB (shortness of breath)    Thyroid  disease    Hypothyroidism    Review of Systems  Constitutional: No fever/chills Cardiovascular: Denies chest pain. Respiratory: Denies shortness of breath. Gastrointestinal: Positive LLQ abdominal pain.  No nausea, no vomiting. Positive  diarrhea.  No constipation. Neurological: Negative for headaches.  ____________________________________________   PHYSICAL EXAM:  VITAL SIGNS: ED Triage Vitals  Encounter Vitals Group     BP 08/17/23 1657 139/75     Pulse Rate 08/17/23 1657 74     Resp 08/17/23 1657 18     Temp 08/17/23 1657 97.7 F (36.5 C)     Temp src --      SpO2 08/17/23 1657 96 %   Constitutional: Alert and oriented. Well appearing and in no acute distress. Eyes: Conjunctivae are normal. Head: Atraumatic. Nose: No congestion/rhinnorhea. Mouth/Throat: Mucous membranes are moist.  Neck: No stridor.   Cardiovascular: Normal rate, regular rhythm. Good peripheral circulation. Grossly normal heart sounds.   Respiratory: Normal respiratory effort.  No retractions. Lungs CTAB. Gastrointestinal: Soft with mild tenderness in the left lower abdomen.  No focal peritonitis. No distention.  Musculoskeletal: No lower extremity tenderness nor edema. No gross deformities of extremities. Neurologic:  Normal speech and language.  Skin:  Skin is warm, dry and intact. No rash noted.  ____________________________________________   LABS (all labs ordered are listed, but only abnormal results are displayed)  Labs Reviewed  COMPREHENSIVE METABOLIC PANEL WITH GFR - Abnormal; Notable for the following components:  Result Value   Glucose, Bld 101 (*)    All other components within normal limits  LIPASE, BLOOD  CBC  URINALYSIS, ROUTINE W REFLEX MICROSCOPIC   ____________________________________________  RADIOLOGY  No results found.  ____________________________________________   PROCEDURES  Procedure(s) performed:   Procedures  None ____________________________________________   INITIAL IMPRESSION / ASSESSMENT AND PLAN / ED COURSE  Pertinent labs & imaging results that were available during my care of the patient were reviewed by me and considered in my medical decision making (see chart for  details).   This patient is Presenting for Evaluation of abdominal pain, which does require a range of treatment options, and is a complaint that involves a high risk of morbidity and mortality.  The Differential Diagnoses Differential diagnosis includes but is not exclusive to acute appendicitis, urinary tract infection, endometriosis, bowel obstruction, hernia, colitis, renal colic, gastroenteritis, volvulus etc.   Critical Interventions-    Medications  sodium chloride  0.9 % bolus 500 mL (500 mLs Intravenous New Bag/Given 08/17/23 1814)  iohexol  (OMNIPAQUE ) 350 MG/ML injection 75 mL (75 mLs Intravenous Contrast Given 08/17/23 1838)    Reassessment after intervention: symptoms improved.    Clinical Laboratory Tests Ordered, included CMP without AKI.  LFTs and bilirubin normal.  Lipase normal.  CBC without leukocytosis.  Radiologic Tests Ordered, included CT abdomen/pelvis. I independently interpreted the images and agree with radiology interpretation.   Medical Decision Making: Summary:  Patient presents emergency department with left lower quadrant abdominal pain.  Mild tenderness.  Plan for CT to evaluate for diverticulitis.  Reevaluation with update and discussion with   ***Considered admission***  Patient's presentation is most consistent with acute presentation with potential threat to life or bodily function.   Disposition:   ____________________________________________  FINAL CLINICAL IMPRESSION(S) / ED DIAGNOSES  Final diagnoses:  None     NEW OUTPATIENT MEDICATIONS STARTED DURING THIS VISIT:  New Prescriptions   No medications on file    Note:  This document was prepared using Dragon voice recognition software and may include unintentional dictation errors.  Fonda Law, MD, [facility]

## 2023-08-17 NOTE — ED Triage Notes (Signed)
 Pt c/o sharp LLQ pain with diarrhea after eating x3 days. Hx of diverticulitis and feels similar.

## 2023-08-17 NOTE — Discharge Instructions (Signed)
  1. Left lower quadrant abdominal pain (Primary) 2. History of diverticulitis - Based on past history of diverticulitis with similar symptoms to your current manifestation of symptoms.  Recommend follow-up in the ER for further evaluation and management. - Proper evaluation requires advanced imaging and stat laboratory testing which are unavailable in urgent care. - Please go to emergency department after leaving urgent care for further evaluation and management. - Recommend rule out of diverticulitis versus other intraabdominal pathology consistent with left lower quadrant abdominal pain.

## 2023-08-25 DIAGNOSIS — K5792 Diverticulitis of intestine, part unspecified, without perforation or abscess without bleeding: Secondary | ICD-10-CM | POA: Diagnosis not present

## 2023-10-06 DIAGNOSIS — K5900 Constipation, unspecified: Secondary | ICD-10-CM | POA: Diagnosis not present

## 2023-10-06 DIAGNOSIS — Z8719 Personal history of other diseases of the digestive system: Secondary | ICD-10-CM | POA: Diagnosis not present

## 2023-10-06 DIAGNOSIS — M25562 Pain in left knee: Secondary | ICD-10-CM | POA: Diagnosis not present

## 2023-10-06 DIAGNOSIS — I10 Essential (primary) hypertension: Secondary | ICD-10-CM | POA: Diagnosis not present

## 2023-10-06 DIAGNOSIS — E039 Hypothyroidism, unspecified: Secondary | ICD-10-CM | POA: Diagnosis not present

## 2023-10-06 DIAGNOSIS — E785 Hyperlipidemia, unspecified: Secondary | ICD-10-CM | POA: Diagnosis not present

## 2023-10-06 DIAGNOSIS — G4733 Obstructive sleep apnea (adult) (pediatric): Secondary | ICD-10-CM | POA: Diagnosis not present

## 2023-10-06 DIAGNOSIS — G2581 Restless legs syndrome: Secondary | ICD-10-CM | POA: Diagnosis not present

## 2023-10-06 DIAGNOSIS — G35 Multiple sclerosis: Secondary | ICD-10-CM | POA: Diagnosis not present

## 2023-10-06 DIAGNOSIS — M25561 Pain in right knee: Secondary | ICD-10-CM | POA: Diagnosis not present

## 2023-10-06 DIAGNOSIS — R7303 Prediabetes: Secondary | ICD-10-CM | POA: Diagnosis not present

## 2023-10-06 DIAGNOSIS — F39 Unspecified mood [affective] disorder: Secondary | ICD-10-CM | POA: Diagnosis not present

## 2023-10-10 ENCOUNTER — Other Ambulatory Visit: Payer: Self-pay | Admitting: Neurology

## 2023-10-17 DIAGNOSIS — R109 Unspecified abdominal pain: Secondary | ICD-10-CM | POA: Diagnosis not present

## 2023-10-17 DIAGNOSIS — R198 Other specified symptoms and signs involving the digestive system and abdomen: Secondary | ICD-10-CM | POA: Diagnosis not present

## 2023-10-17 DIAGNOSIS — Z8719 Personal history of other diseases of the digestive system: Secondary | ICD-10-CM | POA: Diagnosis not present

## 2023-10-25 DIAGNOSIS — M1711 Unilateral primary osteoarthritis, right knee: Secondary | ICD-10-CM | POA: Diagnosis not present

## 2023-10-25 DIAGNOSIS — M1712 Unilateral primary osteoarthritis, left knee: Secondary | ICD-10-CM | POA: Diagnosis not present

## 2023-10-25 DIAGNOSIS — M170 Bilateral primary osteoarthritis of knee: Secondary | ICD-10-CM | POA: Diagnosis not present

## 2023-10-31 DIAGNOSIS — T380X5A Adverse effect of glucocorticoids and synthetic analogues, initial encounter: Secondary | ICD-10-CM | POA: Diagnosis not present

## 2023-10-31 DIAGNOSIS — R739 Hyperglycemia, unspecified: Secondary | ICD-10-CM | POA: Diagnosis not present

## 2023-11-30 NOTE — Progress Notes (Unsigned)
 NEUROLOGY FOLLOW UP OFFICE NOTE  Anna Walker [id number]  Assessment/Plan:   1.  Transient blurred vision/memory deficits, unclear etiology - consider transient elevated blood pressure or change in serum glucose  2.  Migraine with aura, without status migrainosus, not intractable 3.  Recurrent symptoms of right-sided heaviness/numbness, likely complicated migraine.  Based on testing performed over the years, she does not have multiple sclerosis.  Repeat brain MRIs over the years have remained stable and unremarkable.  CSF analysis revealed 1 oligoclonal band, which is not a significant number.  4.  History of right optic neuritis - still with some residual vision loss of right eye. 5.  Restless leg syndrome 6.  Nocturnal leg cramps   1.  Nocturnal leg cramps:  igabapentin 300mg   in evening and 300-600mg  at bedtime.   2.  Migraine prevention:  gabapentin  300mg  at dinner and 300-600mg  at bedtime 3.  RLS:  ropinirole  3mg  at bedtime, gabapentin  300mg  at dinner and 300-600mg  at bedtime 4.  Limit use of pain relievers to no more than 2 days out of week to prevent risk of rebound or medication-overuse headache. 5.  Keep headache diary 6.  Follow up 1 year   Subjective:  Anna Walker is a 68 year old woman with ADD, fatigue, hypertension, restless leg syndrome, migraines, hyperlipidemia, type 2 diabetes mellitus, OSA, hypothyroidism and history of right optic neuritis who follows up for migraines and RLS.Anna Walker   UPDATE: Migraines: Migraines are controlled.  She has not had one since last visit.  Restless leg:  Overall stable.  However she continues to have nocturnal leg cramps that wakes her up at night.  Takes gabapentin  600mg  at bedtime and ropinirole  3mg  at dinner and at bedtime.  If needed, she takes 300mg  gabapentin  earlier in the evening.   Current NSAIDS:  ASA 81mg  daily Current analgesics:  no Current triptans:  sumatriptan  100mg  Current anti-emetic:  no Current muscle  relaxants:  no Current anti-anxiolytic:  no Current sleep aide:  no Current Antihypertensive medications:  HCTZ Current Antidepressant medications:  fluoxetine  30mg  Current Anticonvulsant medications:  gabapentin  300mg  evening PRN and 600mg  at bedtime Other medications:  Ropinirole  3mg  dinner and 3mg  bedtime, amantadine  100mg  three times daily (fatigue)    HISTORY: History of right optic neuritis and other recurrent symptoms: She had an episode of right optic neuritis in 1998, presenting first as blurred vision and then complete vision loss.   A couple of weeks later, she developed right sided numbness of the face, arm and leg.  She underwent a lumbar puncture which demonstrated one oligoclonal band.  MRI of brain reportedly may have shown foci which may be concerning for MS.  MRI cervical spine reportedly did not demonstrate demyelinating lesions.  She was diagnosed with multiple sclerosis and treated with IV steroids.  The vision loss lasted 7-8 weeks.  The numbness lasted several days.  She was treated with DMT for several years.  She started on Betaseron but was then switched to Copaxone and then Avonex due to intolerability.  She had a repeat LP in 2002 which showed no oligoclonal bands and normal IgG index of 0.6.  Visual evoked potential in 2013 showed absent right response and normal left response.  Repeat MRI from July 2014 was thought to be essentially normal so Avonex was discontinued.  Repeat MRI in November 2014 was unchanged.  Therefore, it was believed that she did not have MS.  NMO-IgG antibodies from 02/24/14 were negative.  MRI of cervical spine without contrast from  03/24/16 was personally reviewed and revealed cervical spondylosis but no abnormal cord signal.   Since the episode in 1998, she endorses subtle weakness on the right side of her body.  For example, when she starts to write, her handwriting will start to get worse.  She also reports short-term memory deficits since 1998.  Over  the years, she has had recurrent episodes of right eye pain with right sided numbness.  In the past, they were thought to be MS flares which were treated with steroids.  They occur when she is heated or fatigued.  They typically occur at least once a year and they last about 2 days.  She last had an episode in early January 2019, for which she was evaluated in the ED on 01/30/17.  She had an MRI of the brain and orbits with and without contrast, which was personally reviewed and demonstrated asymmetrically small right optic nerve compatible with sequelae of prior optic neuritis but no enhancement, as well as three nonspecific tiny hyperintense T2/FLAIR foci in the bifrontal periventricular white matter.  All of her symptoms have not progressed over the years and recurrence of these episodes have not increased since discontinuation of Avonex.   She was seen in the ED on 08/27/18 with 1 day of bilateral blurred vision, of gradual onset. It was monocular in either eye.  She first noticed it because she had trouble driving.  She had some right eye ache and dizziness but no headache or new numbness and tingling   MRI of brain/orbits and cervical spine with and without contrast appeared stable and showed no active demyelinating lesions or abnormal signal of optic nerves.  She was diagnosed by in-house neurology with migraine and was treated with migraine cocktail of toradol  and Reglan .  She doesn't have vertigo but she feels insecure and problems with balance when ambulating.  Blurred vision was unchanged.  She followed up with her ophthalmologist, Dr. Roz.  Eye exam was unremarkable except for early cataracts.   In early October 2024, she has been having recurrent episodes of bilateral blurred vision lasting a couple of hours.  Also felt confused.  Couldn't perform simple tasks such as working on the computer.  Usually no associated headache (maybe a slight one on a couple of episodes.  No unilateral numbness or  weakness.  Seen in the ED on 10/29.  MRI of brain with and without contrast personally reviewed was unremarkable.  Followed up with optometry who didn't find anything abnormal except for dry eye.  Unclear if related to her blood sugars (70-150).  Has not checked blood pressure.    Lower extremity pain/restless leg syndrome: She has pain in the legs and gait issues.  She does have arthritis in the knees, which required surgery and had a subsequent DVT.  She is treated for restless leg syndrome, for which she takes ropinirole  and gabapentin .  She has chronic burning in the hands.   She also reports chronic fatigue.  She has OSA and is on CPAP.  She takes amantadine .   Migraines: She has history of migraines since early adulthood.  They are severe bifrontal pain associated with nausea, photophobia and phonophobia but no vomiting, new visual disturbance or unilateral numbness or weakness.  They last 1 to 2 days and usually occur once a year.  There are no specific triggers or relieving factors however eye gets more blurred when fatigued or with drop in blood sugar.    She also reports another migraine described  as severe sharp right retro-orbital pain in September 2019.  One one occasion, pain radiated into right ear and posterior neck.  Constant but fluctuates.  There is associated blurred vision in right eye, dizziness, nausea, photophobia and phonophobia, eye twitching and fatigue.  No associated right sided numbness and weakness.     Past medication:  topiramate   PAST MEDICAL HISTORY: Past Medical History:  Diagnosis Date   ADD (attention deficit disorder)    Anxiety    Biliary colic    Bladder leak    Diabetes mellitus without complication (HCC)    type 2 , diet controlled patient not taking any medications   Diverticular disease    DVT (deep venous thrombosis) (HCC)    left leg , resolved after treatment of blood thinners    Edema of both lower extremities    GERD (gastroesophageal  reflux disease)    causes coughing    Glaucoma    Heart murmur    High cholesterol    Hypertension    Hypothyroid    Leg cramps    in my thighs    Leg cramps    Migraines    Multiple sclerosis    per patient  that ha sbeen ruled out , they though it was that initially but it did not preogress like it   Optic neuritis    lesion on right optic nerve; right eye only , reduced vision and blurriness , loss of depth perception, glasses do not help when it flares     OSA (obstructive sleep apnea)    nightly cpap    Paresthesias/numbness    fingers and toes    Pneumonia 2017   Restless leg syndrome    Sleep apnea    SOB (shortness of breath)    Thyroid  disease    Hypothyroidism    MEDICATIONS: Current Outpatient Medications on File Prior to Visit  Medication Sig Dispense Refill   albuterol (VENTOLIN HFA) 108 (90 Base) MCG/ACT inhaler Inhale 1 puff into the lungs every 4 (four) hours as needed.     amantadine  (SYMMETREL ) 100 MG capsule TAKE 2 CAPSULES BY MOUTH DAILY. 180 capsule 0   Dulaglutide  (TRULICITY ) 0.75 MG/0.5ML SOAJ ADMINISTER 0.75 MG UNDER THE SKIN 1 TIME A WEEK (Patient not taking: Reported on 03/10/2023)     FLUoxetine  (PROZAC ) 20 MG capsule Take 40 mg by mouth at bedtime.     gabapentin  (NEURONTIN ) 300 MG capsule Take 1 capsule at dinner and 2 capsules at bedtime. 270 capsule 1   hydrochlorothiazide  (HYDRODIURIL ) 12.5 MG tablet Take 12.5 mg by mouth daily.      levothyroxine  (SYNTHROID ) 137 MCG tablet Take 137 mcg by mouth daily before breakfast.     meloxicam  (MOBIC ) 15 MG tablet      metFORMIN (GLUCOPHAGE) 500 MG tablet Take 500 mg by mouth daily with breakfast.     methocarbamol  (ROBAXIN ) 500 MG tablet Take 500-1,000 mg by mouth every 6 (six) hours as needed.     naproxen  (NAPROSYN ) 500 MG tablet Take 1 tablet (500 mg total) by mouth 2 (two) times daily with a meal. 20 tablet 0   ondansetron  (ZOFRAN -ODT) 4 MG disintegrating tablet Take 1 tablet (4 mg total) by mouth  every 8 (eight) hours as needed. 20 tablet 0   rOPINIRole  (REQUIP ) 3 MG tablet TAKE 1 TABLET BY MOUTH AT DINNER AND AT BEDTIME AS NEEDED 180 tablet 1   rosuvastatin (CRESTOR) 10 MG tablet Take 10 mg by mouth daily.  Vitamin D , Ergocalciferol , (DRISDOL ) 1.25 MG (50000 UNIT) CAPS capsule Take 50,000 Units by mouth 2 (two) times a week. Tues, Thur     No current facility-administered medications on file prior to visit.    ALLERGIES: Allergies  Allergen Reactions   Adderall [Amphetamine -Dextroamphetamine] Other (See Comments)    Caused elevated BP   Topamax  [Topiramate ]     dizzy    FAMILY HISTORY: Family History  Problem Relation Age of Onset   Barrett's esophagus Mother    Hypertension Mother    Obesity Mother    Yvone' disease Sister    Rheum arthritis Sister    Heart attack Father 75   Sudden death Father    Coronary artery disease Neg Hx       Objective:  Blood pressure (!) 144/79, pulse 90, height 5' 6 (1.676 m), weight 271 lb 9.6 oz (123.2 kg), SpO2 96%. General: No acute distress.  Patient appears well-groomed.   Head:  Normocephalic/atraumatic Eyes:  Fundi examined but not visualized Neck: supple, paraspinal tenderness, full range of motion Heart:  Regular rate and rhythm Neurological Exam: alert and oriented.  Speech fluent and not dysarthric, language intact.  Peripheral vision loss in right eye.  No APD.  Some dull sensation of face bilaterally, otherwise CN II-XII intact. Bulk and tone normal, muscle strength 5/5 throughout.  Sensation to pinprick slightly reduced in fingers bilaterally and asymmetrically in toes bilaterally.  Vibratory sensation intact  Deep tendon reflexes 2+ throughout, toes downgoing.  Finger to nose testing intact.  Gait antalgic. Romberg negative.   Juliene Dunnings, DO  CC: Cheryle Frees, MD

## 2023-12-01 ENCOUNTER — Ambulatory Visit: Payer: PPO | Admitting: Neurology

## 2023-12-01 ENCOUNTER — Encounter: Payer: Self-pay | Admitting: Neurology

## 2023-12-01 VITALS — BP 157/78 | HR 82 | Resp 20 | Ht 66.0 in | Wt 263.0 lb

## 2023-12-01 DIAGNOSIS — G43109 Migraine with aura, not intractable, without status migrainosus: Secondary | ICD-10-CM | POA: Diagnosis not present

## 2023-12-01 DIAGNOSIS — G2581 Restless legs syndrome: Secondary | ICD-10-CM

## 2023-12-01 MED ORDER — GABAPENTIN 300 MG PO CAPS
ORAL_CAPSULE | ORAL | 1 refills | Status: AC
Start: 2023-12-01 — End: ?

## 2023-12-01 NOTE — Patient Instructions (Addendum)
 Take gabapentin  300mg  in evening and 600mg  at bedtime Take ropinirole  3mg  at bedtime Tylenol  as needed for migriane

## 2024-01-30 NOTE — Progress Notes (Signed)
 TAL NEER                                          MRN: [id number]   01/30/2024   The [facility] Quality Team Specialist reviewed this patient medical record for the purposes of chart review for care gap closure. The following were reviewed: chart review for care gap closure-controlling blood pressure.    [facility] Quality Team

## 2024-01-31 ENCOUNTER — Other Ambulatory Visit: Payer: Self-pay | Admitting: Neurology

## 2024-06-07 ENCOUNTER — Ambulatory Visit: Admitting: Neurology
